# Patient Record
Sex: Male | Born: 1950 | Race: White | Hispanic: No | Marital: Married | Smoking: Former smoker
Health system: Southern US, Community
[De-identification: ages and names within clinical notes are randomized; demographics above are authoritative.]

## PROBLEM LIST (undated history)

## (undated) DIAGNOSIS — M1990 Unspecified osteoarthritis, unspecified site: Secondary | ICD-10-CM

## (undated) DIAGNOSIS — R7989 Other specified abnormal findings of blood chemistry: Secondary | ICD-10-CM

## (undated) DIAGNOSIS — M545 Low back pain, unspecified: Secondary | ICD-10-CM

## (undated) DIAGNOSIS — E119 Type 2 diabetes mellitus without complications: Secondary | ICD-10-CM

## (undated) DIAGNOSIS — R262 Difficulty in walking, not elsewhere classified: Secondary | ICD-10-CM

## (undated) DIAGNOSIS — G4730 Sleep apnea, unspecified: Secondary | ICD-10-CM

## (undated) DIAGNOSIS — L409 Psoriasis, unspecified: Secondary | ICD-10-CM

## (undated) DIAGNOSIS — L4050 Arthropathic psoriasis, unspecified: Secondary | ICD-10-CM

## (undated) DIAGNOSIS — E875 Hyperkalemia: Secondary | ICD-10-CM

## (undated) DIAGNOSIS — M961 Postlaminectomy syndrome, not elsewhere classified: Secondary | ICD-10-CM

## (undated) DIAGNOSIS — G629 Polyneuropathy, unspecified: Secondary | ICD-10-CM

## (undated) DIAGNOSIS — E7800 Pure hypercholesterolemia, unspecified: Secondary | ICD-10-CM

## (undated) DIAGNOSIS — I2510 Atherosclerotic heart disease of native coronary artery without angina pectoris: Secondary | ICD-10-CM

## (undated) DIAGNOSIS — K760 Fatty (change of) liver, not elsewhere classified: Secondary | ICD-10-CM

## (undated) DIAGNOSIS — M549 Dorsalgia, unspecified: Secondary | ICD-10-CM

## (undated) DIAGNOSIS — I499 Cardiac arrhythmia, unspecified: Secondary | ICD-10-CM

## (undated) HISTORY — PX: OTHER SURGICAL HISTORY: SHX169

## (undated) HISTORY — DX: Type 2 diabetes mellitus without complications: E11.9

## (undated) HISTORY — PX: ECHOCARDIOGRAM, TRANSTHORACIC: SHX3784

## (undated) HISTORY — DX: Dorsalgia, unspecified: M54.9

## (undated) HISTORY — DX: Pure hypercholesterolemia, unspecified: E78.00

## (undated) HISTORY — DX: Hyperkalemia: E87.5

## (undated) HISTORY — DX: Postlaminectomy syndrome, not elsewhere classified: M96.1

## (undated) HISTORY — DX: Atherosclerotic heart disease of native coronary artery without angina pectoris: I25.10

## (undated) HISTORY — DX: Sleep apnea, unspecified: G47.30

## (undated) HISTORY — DX: Low back pain, unspecified: M54.50

## (undated) HISTORY — PX: MICRODISCECTOMY LUMBAR: SUR864

---
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
## 1981-09-24 HISTORY — PX: KNEE ARTHROCENTESIS: SUR44

## 1991-09-25 HISTORY — PX: FRACTURE SURGERY: SHX138

## 2007-09-25 HISTORY — PX: CATARACT EXT.WITH IOL: SHX3376

## 2010-09-24 DIAGNOSIS — R911 Solitary pulmonary nodule: Secondary | ICD-10-CM

## 2010-09-24 HISTORY — DX: Solitary pulmonary nodule: R91.1

## 2011-10-26 HISTORY — PX: LUMBAR FUSION: SHX111

## 2012-07-23 ENCOUNTER — Encounter (INDEPENDENT_AMBULATORY_CARE_PROVIDER_SITE_OTHER): Payer: Self-pay | Admitting: Neurological Surgery

## 2012-07-23 ENCOUNTER — Ambulatory Visit (INDEPENDENT_AMBULATORY_CARE_PROVIDER_SITE_OTHER): Payer: BLUE CROSS/BLUE SHIELD | Admitting: Neurological Surgery

## 2012-07-23 VITALS — BP 130/78 | HR 68 | Wt 194.0 lb

## 2012-07-23 DIAGNOSIS — G8929 Other chronic pain: Secondary | ICD-10-CM

## 2012-07-23 DIAGNOSIS — M549 Dorsalgia, unspecified: Secondary | ICD-10-CM

## 2012-07-23 NOTE — Progress Notes (Addendum)
[facility]  New Patient Note    Referring MD:  [employer]  Primary Care MD: No primary provider on file. pt denies, since moving    Patient ID: Kent Jordan is a 61 y.o. male, DOB Mar 06, 1951.   MRN: [id number]    HPI     Chief Complaint   Patient presents with   . Advice Only     lumbar fusion     HPI  Kent Jordan is 61 y.o. male who presents for evaluation of his low back pain.  He has a history of 2 level diskectomy in 2012 which were not successful in alleviating his pain, by Dr. Wende Jordan.  He had L3-S1 Laminectomy and fusion for herniated disc in February 2013, by Dr. Wende Jordan.  His left leg symptoms resolved after surgery, however, he now has right leg pain.  He was happy with Dr. Piedad Jordan, however, the patient has moved out of of [facility] [place] and would like to pursue a neurosurgeon near his current home.  He has right buttocks pain, radiating to right groin and medial thigh which is a constant dull ache.  He has sharp stabbing pain to his right low back.  Sitting for a long time aggravates his symptoms.  Reclining and getting up from sitting alleviates his pain.  He has had physical therapy form March - August 2013, which was helpful.  He has had previous nerve root block injection to his right SI joint, which did not help.  He denies numbness or tingling.  He has occassional left knee weakness after walking a block.  He denies bowel or bladder problem.  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      Medical History     Past Medical History   Diagnosis Date   . Back pain    . Hypertensive disorder    . High cholesterol      Family History   Problem Relation Age of Onset   . Heart disease Mother    . Heart disease Father       Patient denies family history of disc or spinal surgery.    Surgical History     Past Surgical History   Procedure Date   . Lumbar fusion    . Microdiscectomy lumbar      patient had it done 10/12 11/12   . Knee arthrocentesis 1983       Social  History     History     Social History   . Marital Status: Significant Other     Spouse Name: N/A     Number of Children: N/A   . Years of Education: N/A     Occupational History   . Not on file.     Social History Main Topics   . Smoking status: Former Games developer   . Smokeless tobacco: Not on file   . Alcohol Use: Yes   . Drug Use:    . Sexually Active:      Other Topics Concern   . Not on file     Social History Narrative   . No narrative on file       Current Medications     Current Outpatient Prescriptions   Medication Sig Dispense Refill   . aspirin 81 MG chewable tablet Chew 81 mg by mouth daily.       . benazepril (LOTENSIN) 20 MG tablet        .  diazepam (VALIUM) 5 MG tablet        . folic acid (FOLVITE) 400 MCG tablet Take 400 mcg by mouth daily.       . methotrexate 2.5 MG tablet        . oxyCODONE-acetaminophen (PERCOCET) 5-325 MG per tablet        . predniSONE (DELTASONE) 5 MG tablet        . ZETIA 10 MG tablet            Allergies     Allergies   Allergen Reactions   . Ciprofloxacin Swelling       Review of Systems   Review of Systems  Constitutional: Positive for chills and activity change.   HENT: Positive for facial swelling (patient states this coming from his use of predsnone ).   Eyes: Positive for discharge.   Respiratory: Positive for apnea.   Cardiovascular: Negative.   Gastrointestinal: Negative.   Genitourinary: Negative.   Musculoskeletal: Positive for back pain (patient has had several back surgery) and joint swelling.   Skin: Negative.   Neurological: Positive for light-headedness (occasionally).   Hematological: Negative.   Psychiatric/Behavioral: Positive for sleep disturbance and decreased concentration.     Physical Examination   VITAL SIGNS:   weight is 87.998 kg (194 lb). His blood pressure is 130/78 and his pulse is 68.          Neurologic Exam  Mental Status   Oriented to person, place, and time.   Attention: normal. Concentration: normal.   Speech: speech is normal     Cranial Nerves       CN II   Visual fields full to confrontation.      CN III, IV, VI   EOMI  Pupils are equal, round, and reactive to light.     CN V   Facial sensation intact.      CN VII   Facial expression full, symmetric.     Motor Exam   Muscle bulk: normal     Strength   Strength 5/5 throughout except   Right ileopsoas, quad 4+/5    Sensory Exam   Light touch decrease plantar left great toe   Proprioception normal.     Gait, Coordination, and Reflexes      Gait  Gait: normal     Coordination   Romberg: negative  Tandem walking coordination: normal     Tremor   Resting tremor: absent  Intention tremor: absent  Action tremor: absent     Reflexes   Reflexes 2+ except as noted.   Palpated -  Left SI joint tenderness  Negative neck flexion / extension.     CVS exam: normal rate, regular rhythm, normal S1, S2, no murmurs, rubs, clicks or gallops.  Lungs: CTA, equal chest rise, no wheezes or rhonchi  Abdomen: Soft, nontender. Bowel sounds present x 4; no abdominal tenderness  Well developed, Well nourished, In no apparent distress  Well groomed.  No edema bilateral lower extremity.  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  Generalized protruding disc at L5-S1 with probably small fragment posterior to the ventral right L5 vertebral body with generalized protrusion here abutting the S1 roots bilaterally, but they are really not significantly displaced and no central stenosis.  Congenitally small spinal stenosis with  the general elements as detailed above.  04/28/12 CT scan  Findings: Surgical changes include transpedicle fusion hardware at L3 through S1, discectomy with intervetral spacers L4-5 and L5-S1, and left hemilaminectomy at L4-5.  There is no hardware fracture but there is evidence of fixation screw loosening at L3.  This is  identifiable on the previous plane films.  No sacral fracture.  SI joints shows mild osteoarthritis.  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  Impression: loosening of L3 transpedicle hardware.    Reviewed and Discussed with patient    Impression   I had an extensive discussion with Kent Jordan, regarding his condition. He presents after 3 previous back surgeries, including an L3-S1 dorsal fusion. His pain which resolved post-operatively, has shifted from the left to the right. He has no current imaging to allow for full surgical evaluation today, other than a CT scan that is limited in its ability to view the sagittal planes.     My recommendation to Kent Jordan is to continue his care with Dr.Dix, particularly if he is happy with his care. His knowledge of Kent Jordan circumstances, history, and anatomy. If he would like to transfer his care here, we would need to get an MRI scan to evaluate him anatomically. He will continue to see Dr.Dix, and will contact us in the future if needed.          Plan   No orders of the defined types were placed in this encounter.           Follow-up   No Follow-up on file.    Mahesh Ander Slade, MD    I have personally reviewed and confirmed the history and physical examination, and synthesized the medical decision making. This is a Level 4 [phone]) outpatient consultation of moderate complexity, that is medically necessary in the consideration of major neurological surgery.  The encounter is summarized below:     History Comprehensive     CC Required    X   HPI Extended 4-8 elements, or status of 3 chronic conditions    X   ROS 10 + elements    X   PFSH 3+ elements    X   Examination Comprehensive     Organ Systems 8+    X   Medical Decision Making High     Number of Dx/Mgmt Opts  Extensive (3) Sum points:  -Self-limited or minor (1);   -Established problem, stable or improving (1);  -Established problem, worsening (2)   -New problem, no w/u (3),   -New problem, with w/u (4)   X   Data  Review  Extensive (3) Sum points:  - Review or order clinical lab testsr (1);   - Review or order radiology test (1);    - Review or order medicine test (1)  - Discuss test with performing physician (1)  - Independent Review of imaging (2)   - Decision to obtain old records (1)   - Review and summation of old records (2)   X   Risk  Moderate Presence of one of the following:  -Two or more self-limited or minor problems  -One stable chronic illness  - Acute uncomplicated illness or injury  - Over the counter drugs  -Minor surgery  -Physical Therapy  -Occupational Therapy     X   Severity of Problem  Moderate to High    X     Attending Neurosurgeon: I have seen and examined the patient, reviewed the history and physical (  as outlined by  C.Zi Newbury, PA-C ), and reviewed the radiography. I agree with these elements. I synthesized and documented the assessment and plan , as above.

## 2012-07-23 NOTE — Progress Notes (Signed)
Review of Systems   Constitutional: Positive for chills and activity change.   HENT: Positive for facial swelling (patient states this coming from his use of predsnone ).    Eyes: Positive for discharge.   Respiratory: Positive for apnea.    Cardiovascular: Negative.    Gastrointestinal: Negative.    Genitourinary: Negative.    Musculoskeletal: Positive for back pain (patient has had several back surgery) and joint swelling.   Skin: Negative.    Neurological: Positive for light-headedness (occasionally).   Hematological: Negative.    Psychiatric/Behavioral: Positive for sleep disturbance and decreased concentration.

## 2012-09-05 ENCOUNTER — Other Ambulatory Visit: Payer: Self-pay

## 2012-09-05 ENCOUNTER — Ambulatory Visit: Payer: BLUE CROSS/BLUE SHIELD

## 2012-09-05 DIAGNOSIS — J40 Bronchitis, not specified as acute or chronic: Secondary | ICD-10-CM

## 2012-09-05 DIAGNOSIS — R9389 Abnormal findings on diagnostic imaging of other specified body structures: Secondary | ICD-10-CM

## 2012-09-05 DIAGNOSIS — R911 Solitary pulmonary nodule: Secondary | ICD-10-CM

## 2012-09-09 ENCOUNTER — Ambulatory Visit: Payer: BLUE CROSS/BLUE SHIELD

## 2012-09-09 DIAGNOSIS — R911 Solitary pulmonary nodule: Secondary | ICD-10-CM

## 2012-10-20 ENCOUNTER — Emergency Department: Payer: BLUE CROSS/BLUE SHIELD

## 2012-10-20 ENCOUNTER — Emergency Department
Admission: EM | Admit: 2012-10-20 | Discharge: 2012-10-20 | Disposition: A | Discharge status: Home / Self Care | Payer: BLUE CROSS/BLUE SHIELD | Attending: Emergency Medicine | Admitting: Emergency Medicine

## 2012-10-20 DIAGNOSIS — Z87891 Personal history of nicotine dependence: Secondary | ICD-10-CM | POA: Insufficient documentation

## 2012-10-20 DIAGNOSIS — W260XXA Contact with knife, initial encounter: Secondary | ICD-10-CM | POA: Insufficient documentation

## 2012-10-20 DIAGNOSIS — S61209A Unspecified open wound of unspecified finger without damage to nail, initial encounter: Secondary | ICD-10-CM | POA: Insufficient documentation

## 2012-10-20 DIAGNOSIS — I10 Essential (primary) hypertension: Secondary | ICD-10-CM | POA: Insufficient documentation

## 2012-10-20 DIAGNOSIS — Z23 Encounter for immunization: Secondary | ICD-10-CM | POA: Insufficient documentation

## 2012-10-20 DIAGNOSIS — L4050 Arthropathic psoriasis, unspecified: Secondary | ICD-10-CM | POA: Insufficient documentation

## 2012-10-20 DIAGNOSIS — IMO0001 Reserved for inherently not codable concepts without codable children: Secondary | ICD-10-CM

## 2012-10-20 DIAGNOSIS — W261XXA Contact with sword or dagger, initial encounter: Secondary | ICD-10-CM | POA: Insufficient documentation

## 2012-10-20 MED ORDER — BUPIVACAINE HCL (PF) 0.5 % IJ SOLN
4.00 mL | Freq: Once | INTRAMUSCULAR | Status: AC
Start: 2012-10-20 — End: 2012-10-20
  Administered 2012-10-20: 4 mL
  Filled 2012-10-20: qty 1

## 2012-10-20 MED ORDER — TETANUS-DIPHTH-ACELL PERTUSSIS 5-2.5-18.5 LF-MCG/0.5 IM SUSP
0.50 mL | Freq: Once | INTRAMUSCULAR | Status: AC
Start: 2012-10-20 — End: 2012-10-20
  Administered 2012-10-20: 0.5 mL via INTRAMUSCULAR
  Filled 2012-10-20: qty 0.5

## 2012-10-20 NOTE — ED Notes (Signed)
Pt presents with lac to left 3rd digit with a knife while slicing celery.small amt of bleeding.reports tingling in finger.

## 2012-10-20 NOTE — Discharge Instructions (Signed)
Return to the ER if worse.    Keep wound and dressing clean and dry for 2 days, if possible. If the bandage does get wet or dirty during the first two days, don't worry. Just change it and replace it with a new bandage.    In 2 days, begin washing the wound with soap and water 4 -6 times a day and cover the wound when at work. When at home, you may keep the wound uncovered.    Do not cover the wound with a bandaid 2 days before you are scheduled to have your stitches taken out.    If the wound looks wet or white around the edges on the day of suture removal, please wait 2 more days to have them removed. Don't cover the wound.    Do not soak the wound or immerse the wound in water or chemicals during healing.    Watch for signs of infection: increased pain, swelling, redness, pus, or red streaks. Return to the ED if signs of infection appear.    Return to the ED at 9 a.m. or see your doctor in 10 days for suture removal.         DIPHTERIA, TETANUS & PERTUSSIS Vaccine Information    An information sheet regarding the Diphtheria, Tetanus and Pertussis vaccine from the Centers for Disease Control is attached to these instructions.        TDap  Vaccine: What you need to know     Many Vaccine Information Statements are available in Spanish and other languages. See PromoAge.com.br  Hojas de Informacin Sobre Vacunas estn disponibles en Espaol y en muchos otros idiomas. Visite       1. Why get vaccinated?    Tetanus, diphtheria and pertussis can be very serious diseases, even for adolescents and adults. Tdap vaccine can protect [place] from these diseases.    TETANUS (Lockjaw) causes painful muscle tightening and stiffness, usually all over the body.  . It can lead to tightening of muscles in the head and neck so you can't open your mouth, swallow, or sometimes even breathe. Tetanus kills about 1 out of 5 people who are infected.      DIPHTHERIA can cause a thick coating to form in the back of the throat.  . It can  lead to breathing problems, paralysis, heart failure, and death.    PERTUSSIS (Whooping Cough) causes severe coughing spells, which can cause difficulty breathing, vomiting and disturbed sleep.  . It can also lead to weight loss, incontinence, and rib fractures.   Up to 2 in 100 adolescents and 5 in 100 adults with pertussis are hospitalized or have complications, which could include pneumonia or death.     These diseases are caused by bacteria. Diphtheria and pertussis are spread from person to person through coughing or sneezing. Tetanus enters the body through cuts, scratches, or wounds.    Before vaccines, the [place] saw as many as 200,000 cases a year of diphtheria and pertussis, and hundreds of cases of tetanus. Since vaccination began, tetanus and diphtheria have dropped by about 99% and pertussis by about 80%.    2. Tdap vaccine    Tdap vaccine can protect adolescents and adults from tetanus, diphtheria, and pertussis. One dose of Tdap is routinely given at age 92 or 61.  People who did not get Tdap at that age should get it as soon as possible.    Tdap is especially important for health care professionals and anyone  having close contact with a baby younger than 12 months.    Pregnant women should get a dose of Tdap during every pregnancy, to protect the newborn from   pertussis. Infants are most at risk for severe, life-threatening complications from pertussis.      A similar vaccine, called Td, protects from tetanus and diphtheria, but not pertussis. A Td booster should be given every 10 years. Tdap may be given as one of these boosters if you have not already gotten a dose.  Tdap may also be given after a severe cut or burn to prevent tetanus infection.    Your doctor can give you more information.    Tdap may safely be given at the same time as other vaccines.    3. Some people should not get this vaccine    . If you ever had a life-threatening allergic reaction after a dose of any tetanus,  diphtheria, or pertussis containing vaccine, OR if you have a severe allergy to any part of this vaccine, you should not get Tdap.  Tell your doctor if  you have any severe allergies.  . If you had a coma, or long or multiple seizures within 7 days after a childhood dose of DTP or DTaP, you should not get Tdap, unless a cause other than the vaccine was found.  You can still get Td.  . Talk to your doctor if you:  -  have epilepsy or another nervous system problem,  -  had severe pain or swelling after any vaccine containing diphtheria, tetanus or pertussis,   -  ever had Guillain-Barr Syndrome (GBS),  -  aren't feeling well on the day the shot is scheduled.    4. Risks of a vaccine reaction    With any medicine, including vaccines, there is a chance of side effects. These are usually mild and go away on their own, but serious reactions are also possible.    Brief fainting spells can follow a vaccination, leading to injuries from falling. Sitting or   lying down for about 15 minutes can help prevent these. Tell your doctor if you feel dizzy or light-headed, or have vision changes or ringing in the ears.    Mild Problems following Tdap  (Did not interfere with activities)  . Pain where the shot was given (about 3 in 4 adolescents or 2 in 3 adults)  . Redness or swelling where the shot was given (about 1 person in 5)  . Mild fever of at least 100.3F (up to about 1 in 25 adolescents or 1 in 100 adults)  . Headache (about 3 or 4 people in 10)  . Tiredness (about 1 person in 3 or 4)  . Nausea, vomiting, diarrhea, stomach ache (up to 1 in 4 adolescents or 1 in 10 adults)  . Chills, body aches, sore joints, rash, swollen glands (uncommon)    Moderate Problems following Tdap  (Interfered with activities, but did not require medical attention)  . Pain where the shot was given (about 1 in 5 adolescents or 1 in 100 adults)   . Redness or swelling where the shot was given (up to about 1 in 16 adolescents or 1 in 25  adults)  . Fever over 102F (about 1 in 100 adolescents or 1 in 250 adults)  . Headache (about 3 in 20 adolescents or 1 in 10 adults)  . Nausea, vomiting, diarrhea, stomach ache (up to 1 or 3 people in 100)  . Swelling of the  entire arm where the shot was given (up to about 3 in 100).     Severe Problems following Tdap  (Unable to perform usual activities; required medical attention)  . Swelling, severe pain, bleeding and redness in the arm where the shot was given (rare).    A severe allergic reaction could occur after any vaccine (estimated less than 1 in a million doses).     5. What if there is a serious reaction?    What should I look for?    . Look for anything that concerns you, such as signs of a severe allergic reaction, very high fever, or behavior changes.    Signs of a severe allergic reaction can include hives, swelling of the face and throat, difficulty breathing, a fast heartbeat, dizziness, and weakness. These would start a few minutes to a few hours after the vaccination.    What should I do?    . If you think it is a severe allergic reaction or other emergency that can't wait, call 9-1-1 or get the person to the nearest hospital. Otherwise, call your doctor.  . Afterward, the reaction should be reported to the Vaccine Adverse Event Reporting System (VAERS). Your doctor might file this report, or you can do it yourself through the VAERS web site at www.vaers.LAgents.no, or by calling 1-484-154-1665.    VAERS is only for reporting reactions. They do not give medical advice.      6. The National Vaccine Injury Compensation Program    The [employer] Vaccine Injury Compensation Program (VICP) is a federal program that was created to compensate people who may have been injured by certain vaccines.    Persons who believe they may have been injured by a vaccine can learn about the program and about filing a claim by calling 1-754-599-5174 or visiting the VICP website at SpiritualWord.at.      7.  How can I learn more?  . Ask your doctor.  . Call your local or state health department.  Benay Pillow the Centers for Disease Control and   Prevention (CDC):  - Call 249-598-4027 (1-800-CDC-INFO) or  - Visit CDC's website at PicCapture.uy      Vaccine Information Statement (Interim)  Tdap Vaccine  (01/31/12)   [place]  [phone]    Department of Health and Insurance risk surveyor for Disease Control and Prevention      Office Use Only

## 2012-10-20 NOTE — ED Provider Notes (Signed)
EMERGENCY DEPARTMENT HISTORY AND PHYSICAL EXAM     Physician/Midlevel provider first contact with patient: 10/20/12 1850         Date: 10/20/2012  Patient Name: Kent Jordan    History of Presenting Illness     Chief Complaint   Patient presents with   . finger laceration        History Provided By: patient    Chief Complaint: finger laceration  Onset: just pta  Timing: constant  Location: left 3rd digit  Quality: straight  Severity: mild   Modifying Factors: none   Associated Symptoms: none    Additional History: Kent Jordan is a 62 y.o. male arrived to the ED due to L middle finger laceration that occurred just pta. The pt sustained laceration while chopping celery for dinner tonight. The pt reports he is on methotrexate for psoriatic arthritis and as such tends to bleed a little more than usual. The pt has no other complaints currently. Patient is right handed. Last Td: unknown.    PCP: Pcp, Noneorunknown, MD      Current Facility-Administered Medications   Medication Dose Route Frequency Provider Last Rate Last Dose   . [COMPLETED] bupivacaine (PF) (MARCAINE) 0.5 % injection 4 mL  4 mL Other Once in ED Fransisca Connors, PA   4 mL at 10/20/12 1916   . [COMPLETED] TdaP Booster (BOOSTRIX) injection 0.5 mL  0.5 mL Intramuscular Once Fransisca Connors, PA   0.5 mL at 10/20/12 1912     Current Outpatient Prescriptions   Medication Sig Dispense Refill   . aspirin 81 MG chewable tablet Chew 81 mg by mouth daily.       . benazepril (LOTENSIN) 20 MG tablet        . diazepam (VALIUM) 5 MG tablet        . folic acid (FOLVITE) 400 MCG tablet Take 400 mcg by mouth daily.       . methotrexate 2.5 MG tablet        . oxyCODONE-acetaminophen (PERCOCET) 5-325 MG per tablet        . predniSONE (DELTASONE) 5 MG tablet        . ZETIA 10 MG tablet            Past History     Past Medical History:  Past Medical History   Diagnosis Date   . Back pain    . Hypertensive disorder    . High cholesterol        Past Surgical  History:  Past Surgical History   Procedure Date   . Lumbar fusion    . Microdiscectomy lumbar      patient had it done 10/12 11/12   . Knee arthrocentesis 1983       Family History:  Family History   Problem Relation Age of Onset   . Heart disease Mother    . Heart disease Father        Social History:  History   Substance Use Topics   . Smoking status: Former Games developer   . Smokeless tobacco: Not on file   . Alcohol Use: Yes       Allergies:  Allergies   Allergen Reactions   . Ciprofloxacin Swelling       Review of Systems     Review of Systems   Constitutional: Negative for fever, chills and diaphoresis.   HENT: Positive for neck pain.    Gastrointestinal: Negative for nausea, vomiting and diarrhea.  Musculoskeletal: Negative for myalgias and falls.   Skin: Negative for itching and rash.        +left middle finger laceration   Neurological: Negative for dizziness.   Endo/Heme/Allergies: Negative for environmental allergies.         Physical Exam   BP 160/77  Pulse 59  Temp 98.2 F (36.8 C)  Resp 16  Ht 1.803 m  Wt 90.719 kg  BMI 27.91 kg/m2  SpO2 97%    Physical Exam   Nursing note and vitals reviewed.  Constitutional: He is oriented to person, place, and time. He appears well-developed and well-nourished.   HENT:   Head: Normocephalic and atraumatic.   Eyes: Pupils are equal, round, and reactive to light.   Musculoskeletal:        Left hand: He exhibits tenderness and laceration. He exhibits normal range of motion, no bony tenderness, normal two-point discrimination, normal capillary refill, no deformity and no swelling. normal sensation noted. Normal strength noted.        Hands:       2.7cm tidy, full-thickness laceration through the radial tip of left middle finger and extending through the nailplate and into the nailbed. Bleeding is controlled. No fb or fx noted. Flexion and extension is intact. Brisk capillary refill is intact.    Neurological: He is alert and oriented to person, place, and time.    Skin: Skin is warm and dry.   Psychiatric: He has a normal mood and affect. His behavior is normal. Judgment and thought content normal.         Diagnostic Study Results     Labs -     Results     ** No Results found for the last 24 hours. **          Radiologic Studies -   Radiology Results (24 Hour)     ** No Results found for the last 24 hours. **      .      Medical Decision Making   I am the first provider for this patient.    I reviewed the vital signs, available nursing notes, past medical history, past surgical history, family history and social history.    Vital Signs-Reviewed the patient's vital signs.     No data found.      Pulse Oximetry Analysis - Normal 97% on RA    ED Course:    Provider Notes:   7:53 PM - Pt counseled on diagnosis, f/u plans, and signs and symptoms when to return to ED.  Pt is stable and ready for discharge.        Procedures:  Lac Repair  Date/Time: 10/20/2012 7:10 PM  Performed by: Dario Ave D  Authorized by: Dario Ave D  Consent: Verbal consent obtained.  Risks and benefits: risks, benefits and alternatives were discussed  Consent given by: patient  Patient understanding: patient states understanding of the procedure being performed  Patient identity confirmed: verbally with patient and arm band  Time out: Immediately prior to procedure a "time out" was called to verify the correct patient, procedure, equipment, support staff and site/side marked as required.  Body area: upper extremity  Location details: left long finger  Laceration length: 2.7 cm  Foreign bodies: no foreign bodies  Tendon involvement: none  Nerve involvement: none  Vascular damage: no  Anesthesia: digital block  Local anesthetic: bupivacaine 0.5% without epinephrine  Anesthetic total: 5 ml  Patient sedated: no  Preparation: Patient was prepped and  draped in the usual sterile fashion.  Irrigation solution: saline  Irrigation method: syringe  Amount of cleaning: extensive  Debridement: moderate  (distal portion of nailplate was bluntly dissected and removed to facilitate repair of the nailbed.)  Degree of undermining: none  Skin closure: 4-0 Prolene  Wound subcutaneous closure material used: nailbed closed with 6-0 vicryl x 3.  Number of sutures: 6  Technique: simple  Approximation: close  Approximation difficulty: simple  Dressing: 4x4 sterile gauze, antibiotic ointment, gauze roll and splint  Patient tolerance: Patient tolerated the procedure well with no immediate complications.        Diagnosis     Clinical Impression:   1. Laceration of third finger of left hand    2. Need for Tdap vaccination        _______________________________    Attestations:  This note is prepared by Adelina Mings, acting as Scribe for [employer], PA-C.    Jamelle Rushing, PA-C: The scribe's documentation has been prepared under my direction and personally reviewed by me in its entirety.  I confirm that the note above accurately reflects all work, treatment, procedures, and medical decision making performed by me.    _______________________________          Fransisca Connors, [place]  10/21/12 [phone]

## 2012-10-21 NOTE — ED Provider Notes (Signed)
I have reviewed the notes, assessments, and/or procedures performed by PA Mortiere, I concur with his documentation of Kent Jordan.      Westley Foots, MD  10/21/12 1028

## 2012-11-27 ENCOUNTER — Other Ambulatory Visit: Payer: Self-pay | Admitting: Pulmonary Disease

## 2012-11-27 DIAGNOSIS — R911 Solitary pulmonary nodule: Secondary | ICD-10-CM

## 2012-12-01 ENCOUNTER — Ambulatory Visit: Payer: BLUE CROSS/BLUE SHIELD | Attending: Pulmonary Disease

## 2012-12-01 DIAGNOSIS — R911 Solitary pulmonary nodule: Secondary | ICD-10-CM | POA: Insufficient documentation

## 2012-12-18 ENCOUNTER — Other Ambulatory Visit: Payer: Self-pay | Admitting: Physical Medicine & Rehabilitation

## 2012-12-18 DIAGNOSIS — M961 Postlaminectomy syndrome, not elsewhere classified: Secondary | ICD-10-CM

## 2012-12-18 DIAGNOSIS — M461 Sacroiliitis, not elsewhere classified: Secondary | ICD-10-CM

## 2012-12-18 DIAGNOSIS — M545 Low back pain, unspecified: Secondary | ICD-10-CM

## 2012-12-22 ENCOUNTER — Ambulatory Visit: Payer: BLUE CROSS/BLUE SHIELD

## 2012-12-22 ENCOUNTER — Ambulatory Visit: Payer: BLUE CROSS/BLUE SHIELD | Attending: Physical Medicine & Rehabilitation

## 2012-12-22 ENCOUNTER — Other Ambulatory Visit: Payer: Self-pay | Admitting: Physical Medicine & Rehabilitation

## 2012-12-22 DIAGNOSIS — M461 Sacroiliitis, not elsewhere classified: Secondary | ICD-10-CM

## 2012-12-22 DIAGNOSIS — M545 Low back pain, unspecified: Secondary | ICD-10-CM

## 2012-12-22 DIAGNOSIS — Z981 Arthrodesis status: Secondary | ICD-10-CM | POA: Insufficient documentation

## 2012-12-22 DIAGNOSIS — M5380 Other specified dorsopathies, site unspecified: Secondary | ICD-10-CM | POA: Insufficient documentation

## 2012-12-22 DIAGNOSIS — M961 Postlaminectomy syndrome, not elsewhere classified: Secondary | ICD-10-CM

## 2012-12-22 DIAGNOSIS — M48061 Spinal stenosis, lumbar region without neurogenic claudication: Secondary | ICD-10-CM | POA: Insufficient documentation

## 2012-12-22 DIAGNOSIS — M5126 Other intervertebral disc displacement, lumbar region: Secondary | ICD-10-CM | POA: Insufficient documentation

## 2012-12-22 LAB — POCT CREATININE STAT SENSOR (AH)
Creatinine POCT: 1.2 mg/dL (ref ?–1.2)
GFR POCT: >60 mL/min/{1.73_m2} (ref 60–?)
Reference Range: NORMAL
Reference Range: NORMAL

## 2012-12-22 MED ORDER — GADOBUTROL 1 MMOL/ML IV SOLN
9.0000 mL | Freq: Once | INTRAVENOUS | Status: AC | PRN
Start: 2012-12-22 — End: 2012-12-22
  Administered 2012-12-22: 9 mmol via INTRAVENOUS

## 2013-03-20 ENCOUNTER — Other Ambulatory Visit: Payer: Self-pay | Admitting: Physical Medicine & Rehabilitation

## 2013-03-20 DIAGNOSIS — IMO0002 Reserved for concepts with insufficient information to code with codable children: Secondary | ICD-10-CM

## 2013-03-20 DIAGNOSIS — M5124 Other intervertebral disc displacement, thoracic region: Secondary | ICD-10-CM

## 2013-03-20 DIAGNOSIS — M47817 Spondylosis without myelopathy or radiculopathy, lumbosacral region: Secondary | ICD-10-CM

## 2013-03-25 ENCOUNTER — Ambulatory Visit: Payer: BLUE CROSS/BLUE SHIELD

## 2013-08-24 ENCOUNTER — Other Ambulatory Visit: Payer: Self-pay | Admitting: Internal Medicine

## 2013-08-24 DIAGNOSIS — R945 Abnormal results of liver function studies: Secondary | ICD-10-CM

## 2013-08-27 ENCOUNTER — Ambulatory Visit
Admission: RE | Admit: 2013-08-27 | Discharge: 2013-08-27 | Disposition: A | Discharge status: Home / Self Care | Payer: BLUE CROSS/BLUE SHIELD | Source: Ambulatory Visit | Attending: Internal Medicine | Admitting: Internal Medicine

## 2013-08-27 DIAGNOSIS — R945 Abnormal results of liver function studies: Secondary | ICD-10-CM | POA: Insufficient documentation

## 2013-08-27 DIAGNOSIS — K7689 Other specified diseases of liver: Secondary | ICD-10-CM | POA: Insufficient documentation

## 2013-08-27 DIAGNOSIS — K869 Disease of pancreas, unspecified: Secondary | ICD-10-CM | POA: Insufficient documentation

## 2013-09-09 ENCOUNTER — Other Ambulatory Visit: Payer: Self-pay | Admitting: Pulmonary Disease

## 2013-09-09 DIAGNOSIS — R918 Other nonspecific abnormal finding of lung field: Secondary | ICD-10-CM

## 2013-09-11 ENCOUNTER — Ambulatory Visit
Admission: RE | Admit: 2013-09-11 | Discharge: 2013-09-11 | Disposition: A | Discharge status: Home / Self Care | Payer: BLUE CROSS/BLUE SHIELD | Source: Ambulatory Visit | Attending: Pulmonary Disease | Admitting: Pulmonary Disease

## 2013-09-11 DIAGNOSIS — R911 Solitary pulmonary nodule: Secondary | ICD-10-CM | POA: Insufficient documentation

## 2013-09-11 DIAGNOSIS — R918 Other nonspecific abnormal finding of lung field: Secondary | ICD-10-CM

## 2013-09-22 ENCOUNTER — Emergency Department
Admission: EM | Admit: 2013-09-22 | Discharge: 2013-09-22 | Disposition: A | Discharge status: Home / Self Care | Payer: BLUE CROSS/BLUE SHIELD | Attending: Emergency Medicine | Admitting: Emergency Medicine

## 2013-09-22 ENCOUNTER — Emergency Department: Payer: BLUE CROSS/BLUE SHIELD

## 2013-09-22 DIAGNOSIS — K5289 Other specified noninfective gastroenteritis and colitis: Secondary | ICD-10-CM | POA: Insufficient documentation

## 2013-09-22 DIAGNOSIS — R519 Headache, unspecified: Secondary | ICD-10-CM

## 2013-09-22 DIAGNOSIS — K529 Noninfective gastroenteritis and colitis, unspecified: Secondary | ICD-10-CM

## 2013-09-22 DIAGNOSIS — R51 Headache: Secondary | ICD-10-CM | POA: Insufficient documentation

## 2013-09-22 DIAGNOSIS — Z87891 Personal history of nicotine dependence: Secondary | ICD-10-CM | POA: Insufficient documentation

## 2013-09-22 DIAGNOSIS — Z883 Allergy status to other anti-infective agents status: Secondary | ICD-10-CM | POA: Insufficient documentation

## 2013-09-22 DIAGNOSIS — Z888 Allergy status to other drugs, medicaments and biological substances status: Secondary | ICD-10-CM | POA: Insufficient documentation

## 2013-09-22 DIAGNOSIS — I10 Essential (primary) hypertension: Secondary | ICD-10-CM | POA: Insufficient documentation

## 2013-09-22 HISTORY — DX: Unspecified osteoarthritis, unspecified site: M19.90

## 2013-09-22 LAB — CBC AND DIFFERENTIAL
Hematocrit: 42.9 % (ref 42.0–52.0)
Hgb: 15.3 g/dL (ref 13.0–17.0)
MCH: 32.6 pg — ABNORMAL HIGH (ref 28.0–32.0)
MCHC: 35.7 g/dL (ref 32.0–36.0)
MCV: 91.3 fL (ref 80.0–100.0)
MPV: 8.9 fL — ABNORMAL LOW (ref 9.4–12.3)
Platelets: 223 10*3/uL (ref 140–400)
RBC: 4.7 10*6/uL (ref 4.70–6.00)
RDW: 13 % (ref 12–15)
WBC: 16.33 10*3/uL — ABNORMAL HIGH (ref 3.50–10.80)

## 2013-09-22 LAB — BASIC METABOLIC PANEL
Anion Gap: 13 (ref 5.0–15.0)
BUN: 15 mg/dL (ref 9.0–21.0)
CO2: 20 mEq/L — ABNORMAL LOW (ref 22–29)
Calcium: 9.1 mg/dL (ref 8.5–10.5)
Chloride: 102 mEq/L (ref 98–107)
Creatinine: 0.9 mg/dL (ref 0.7–1.3)
Glucose: 129 mg/dL — ABNORMAL HIGH (ref 70–100)
Potassium: 4 mEq/L (ref 3.5–5.1)
Sodium: 135 mEq/L — ABNORMAL LOW (ref 136–145)

## 2013-09-22 LAB — HEPATIC FUNCTION PANEL
ALT: 81 U/L — ABNORMAL HIGH (ref 0–55)
AST (SGOT): 30 U/L (ref 5–34)
Albumin/Globulin Ratio: 1.4 (ref 0.9–2.2)
Albumin: 3.9 g/dL (ref 3.5–5.0)
Alkaline Phosphatase: 79 U/L (ref 40–150)
Bilirubin Direct: 0.3 mg/dL (ref 0.0–0.5)
Bilirubin Indirect: 0.5 mg/dL (ref 0.0–1.1)
Bilirubin, Total: 0.8 mg/dL (ref 0.2–1.2)
Globulin: 2.7 g/dL (ref 2.0–3.6)
Protein, Total: 6.6 g/dL (ref 6.0–8.3)

## 2013-09-22 LAB — MAN DIFF ONLY
Band Neutrophils Absolute: 0.49 10*3/uL (ref 0.00–1.00)
Band Neutrophils: 3 %
Basophils Absolute Manual: 0 10*3/uL (ref 0.00–0.20)
Basophils Manual: 0 %
Eosinophils Absolute Manual: 0 10*3/uL (ref 0.00–0.70)
Eosinophils Manual: 0 %
Lymphocytes Absolute Manual: 1.96 10*3/uL (ref 0.50–4.40)
Lymphocytes Manual: 12 %
Monocytes Absolute: 2.29 10*3/uL — ABNORMAL HIGH (ref 0.00–1.20)
Monocytes Manual: 14 %
Neutrophils Absolute Manual: 11.59 10*3/uL — ABNORMAL HIGH (ref 1.80–8.10)
Nucleated RBC: 0 (ref 0–1)
Segmented Neutrophils: 71 %

## 2013-09-22 LAB — GFR: EGFR: >60

## 2013-09-22 LAB — CELL MORPHOLOGY: Cell Morphology: NORMAL

## 2013-09-22 LAB — LIPASE: Lipase: 11 U/L (ref 8–78)

## 2013-09-22 MED ORDER — KETOROLAC TROMETHAMINE 30 MG/ML IJ SOLN
30.00 mg | Freq: Once | INTRAMUSCULAR | Status: AC
Start: 2013-09-22 — End: 2013-09-22
  Administered 2013-09-22: 30 mg via INTRAVENOUS
  Filled 2013-09-22: qty 1

## 2013-09-22 MED ORDER — METOCLOPRAMIDE HCL 5 MG/ML IJ SOLN
10.0000 mg | Freq: Once | INTRAMUSCULAR | Status: AC
Start: 2013-09-22 — End: 2013-09-22
  Administered 2013-09-22: 10 mg via INTRAVENOUS
  Filled 2013-09-22: qty 2

## 2013-09-22 MED ORDER — ONDANSETRON 4 MG PO TBDP
4.0000 mg | ORAL_TABLET | Freq: Four times a day (QID) | ORAL | Status: AC | PRN
Start: 2013-09-22 — End: 2013-09-27

## 2013-09-22 MED ORDER — SODIUM CHLORIDE 0.9 % IV BOLUS
1000.0000 mL | Freq: Once | INTRAVENOUS | Status: AC
Start: 2013-09-22 — End: 2013-09-22
  Administered 2013-09-22: 1000 mL via INTRAVENOUS

## 2013-09-22 NOTE — ED Notes (Addendum)
Pt prescription for Zofran ODT called to [facility] at [facility] with [facility] Pharmacist.pt informed.

## 2013-09-22 NOTE — Discharge Instructions (Signed)
Dear Kent Jordan:     Thank you for choosing the [facility] for your healthcare needs, it was a privilege caring for you. We are genuinely concerned about your health and comfort and I hope your visit today was excellent.     Below is some information that our patients often find helpful.    Instructions:   Please follow-up with your primary care doctor.    Please start a liquid diet for your symptoms and then switch to bland diet as needed.     We recommend taking Zofran ODT as needed for your nausea.      We wish you good health and please do not hesitate to contact me if I can ever be of any assistance.     Sincerely,   [name], MD  [facility]  [phone]    Vomiting / Diarrhea [name]), Viral    You have been diagnosed with vomiting and diarrhea, also called gastroenteritis or "stomach flu." In your case, the cause is thought to be a virus.    Although the term "stomach flu" is often used, gastroenteritis IS NOT THE FLU. The real flu is a serious respiratory (lung) infection caused by a virus called influenza.    Gastroenteritis almost always causes diarrhea and sometimes, bloody diarrhea. It might also cause abdominal cramping, vomiting, or fever (temperature higher than 100.28F / 38C). If you vomit, but do not have diarrhea, you may not have gastroenteritis. The usual treatment is medication for nausea and diarrhea. It might be helpful to avoid eating for the next day. Drink clear liquids, including water, broth, or diluted (watered down) juice. Avoid large heavy meals that make you sick to your stomach. Add more foods as you feel better. Symptoms usually improve over a few days but can last up to a week.    It is important to get enough fluids when you have a stomach virus.    Follow up with your primary doctor if you do not improve or if you feel worse.    YOU SHOULD SEEK MEDICAL ATTENTION IMMEDIATELY, EITHER HERE OR AT THE NEAREST EMERGENCY  DEPARTMENT, IF ANY OF THE FOLLOWING OCCURS:   You do not urinate at least once every 8 hours.   Your vomiting becomes more frequent or you cannot keep fluids down.   Your vomit is bloody or dark green or your stool is bloody.   You do not improve over 2 to 3 days.   You develop any new symptoms or concerns.    Headache    You have been treated for a headache.    Headaches are very common. Most of the time they are benign (not harmful). Some headaches can be very serious. Your headache appears to be benign. The doctor feels it is safe for you to go home.    If you continue to have headaches, or if this headache does not resolve over the next few days, you should be evaluated by your regular doctor or a neurologist. Keep a "headache diary." This may help your doctor learn the cause of your headaches.    Take your headache medication as directed. This is especially important if your doctor has placed you on a daily medication to prevent headaches.    YOU SHOULD SEEK MEDICAL ATTENTION IMMEDIATELY, EITHER HERE OR AT THE NEAREST EMERGENCY DEPARTMENT, IF ANY OF THE FOLLOWING OCCURS:   Your headache gets worse.   You have a severe headache that occurs  suddenly.   Your head pain is different from your normal headache.   You have a fever, especially with a stiff neck.   You feel numbness, tingling, or weakness in your arms or legs.   You pass out.   You have problems with your vision.   You vomit and have trouble taking medication or keeping it down.

## 2013-09-22 NOTE — ED Provider Notes (Signed)
EMERGENCY DEPARTMENT HISTORY AND PHYSICAL EXAM     Physician/Midlevel provider first contact with patient: 09/22/13 2440         Date: 09/22/2013  Patient Name: Kent Jordan  Attending Physician: [name] MD  Diagnosis and Treatment Plan       Clinical Impression:   1. Gastroenteritis    2. Headache        Treatment Plan:   ED Disposition     Discharge Kent Jordan discharge to home/self care.    Condition at disposition: Stable            History of Presenting Illness     Chief Complaint   Patient presents with   . Emesis   . Diarrhea   . Headache       History Provided By: Patient   Chief Complaint: vomiting/ diarrhea   Onset: x 2 AM  Timing: intermittent   Severity: moderate   Modifying Factors:none   Associated sxs: + headache and body aches     Additional History: Kent Jordan is a 62 y.o. male presents with intermittent episodes of vomiting/diarrhea associated with body aches and headache x 2 AM. Pt had a hamburger and a couple of beers for lunch yesterday. He reports 4 episodes of vomiting since onset. He denies any blood in stool or vomit, cough, rhinorrhea, sore throat or any other upper respiratory symptoms.    PCP: Pcp, Noneorunknown, MD      Current facility-administered medications:[COMPLETED] ketorolac (TORADOL) injection 30 mg, 30 mg, Intravenous, Once, Destinee Taber, Raynelle Chary, MD, 30 mg at 09/22/13 0934;  [COMPLETED] metoclopramide (REGLAN) injection 10 mg, 10 mg, Intravenous, Once, Lorie Cleckley, Raynelle Chary, MD, 10 mg at 09/22/13 0934;  [COMPLETED] sodium chloride 0.9 % bolus 1,000 mL, 1,000 mL, Intravenous, Once, [name], MD, 1,000 mL at 09/22/13 0936  Current outpatient prescriptions:adalimumab (HUMIRA) 40 MG/0.8ML injection, Inject 40 mg into the skin once., Disp: , Rfl: ;  rosuvastatin (CRESTOR) 5 MG tablet, Take 5 mg by mouth daily., Disp: , Rfl: ;  Tapentadol HCl (NUCYNTA ER) 200 MG Tablet SR 12 hr, Take by mouth., Disp: , Rfl: ;  aspirin 81 MG chewable tablet, Chew 81 mg by mouth daily., Disp: , Rfl:  ;  benazepril (LOTENSIN) 20 MG tablet, , Disp: , Rfl:   diazepam (VALIUM) 5 MG tablet, , Disp: , Rfl: ;  folic acid (FOLVITE) 400 MCG tablet, Take 400 mcg by mouth daily., Disp: , Rfl: ;  methotrexate 2.5 MG tablet, , Disp: , Rfl: ;  ondansetron (ZOFRAN ODT) 4 MG disintegrating tablet, Take 1 tablet (4 mg total) by mouth every 6 (six) hours as needed for Nausea., Disp: 20 tablet, Rfl: 0;  oxyCODONE-acetaminophen (PERCOCET) 5-325 MG per tablet, , Disp: , Rfl:   predniSONE (DELTASONE) 5 MG tablet, , Disp: , Rfl: ;  ZETIA 10 MG tablet, , Disp: , Rfl:     Past Medical History     Past Medical History   Diagnosis Date   . Back pain    . Hypertensive disorder    . High cholesterol    . Arthritis      Past Surgical History   Procedure Date   . Lumbar fusion    . Microdiscectomy lumbar      patient had it done 10/12 11/12   . Knee arthrocentesis 1983       Family History     Family History   Problem Relation Age of Onset   . Heart disease Mother    .  Heart disease Father        Social History     History     Social History   . Marital Status: Single     Spouse Name: N/A     Number of Children: N/A   . Years of Education: N/A     Social History Main Topics   . Smoking status: Former Games developer   . Smokeless tobacco: Not on file   . Alcohol Use: 1.2 oz/week     2 Cans of beer per week   . Drug Use:    . Sexually Active:      Other Topics Concern   . Not on file     Social History Narrative   . No narrative on file        Allergies     Allergies   Allergen Reactions   . Ciprofloxacin Swelling   . Gabapentin        Review of Systems     Review of Systems:  General: negative for chills, fever or malaise  Psychological: negative for depression or suicidal ideation  ENT: negative for epistaxis, nasal congestion or sore throat  Respiratory: no cough, shortness of breath, or wheezing  Cardiovascular: no chest pain or dyspnea on exertion  Gastrointestinal: +abdominal pain, change in bowel habits, no black or bloody  stools  Genito-Urinary: no dysuria, trouble voiding, or hematuria  Musculoskeletal: negative for - joint pain or swelling in leg - bilateral  Neurological: no TIA or stroke symptoms, +HA  Dermatological: negative for pruritus and rash      Physical Exam     BP 109/74  Pulse 98  Temp 98.2 F (36.8 C)  Resp 16  Ht 1.803 m  Wt 90.719 kg  BMI 27.91 kg/m2  SpO2 97%    Constitutional: Vital signs reviewed. General malaise  Neuro: alert and oriented x 3, CN II - XII intact, upper and lower extremities 5/5 strength intact, sensation intact, normal gait  Head: Normocephalic, atraumatic. No external trauma noted.  Eyes: Conjunctiva and sclera are normal. Extraocular movements intact, pupils equal, round, reactive  Ear, Nose, Throat:  Normal external examination of the nose and ears. Oropharynx clear, no tonsillar exudates. Uvula midline.  Neck: Normal range of motion. Trachea midline. No midline cervical spine tenderness. No cervical lymphadenopathy  Respiratory/Chest: Clear to auscultation. No respiratory distress. No wheezing, rhonchi or rales.  Cardiovascular: tachycardic rate and regular rhythm. No murmurs.  Abdomen: Soft. Non-tender to palpation. No masses. No guarding or rebound.  Back: No midline tenderness, no CVA tenderness.   Extremities: Upper and lower extremities with no edema, no cyanosis. Normal range of motion.  Skin: Warm and dry. No rash.    Diagnostic Study Results     Labs -     Results     Procedure Component Value Units Date/Time    Basic Metabolic Panel [[id number]]  (Abnormal) Collected:09/22/13 0927    Specimen Information:Blood Updated:09/22/13 1010     Glucose 129 (H) mg/dL      BUN 03.4 mg/dL      Creatinine 0.9 mg/dL      CALCIUM 9.1 mg/dL      Sodium 742 (L) mEq/L      Potassium 4.0 mEq/L      Chloride 102 mEq/L      CO2 20 (L) mEq/L      Anion Gap 13.0     Hepatic Function Panel (LFTs) [[id number]]  (Abnormal) Collected:09/22/13 0927    Specimen Information:Blood Updated:09/22/13 1010  Bilirubin, Total 0.8 mg/dL      Bilirubin, Direct 0.3 mg/dL      Bilirubin, Indirect 0.5 mg/dL      AST (SGOT) 30 U/L      ALT 81 (H) U/L      Alkaline Phosphatase 79 U/L      Protein, Total 6.6 g/dL      Albumin 3.9 g/dL      Globulin 2.7 g/dL      Albumin/Globulin Ratio 1.4     Lipase [[id number]] Collected:09/22/13 0927    Specimen Information:Blood Updated:09/22/13 1010     Lipase 11 U/L     GFR [[id number]] Collected:09/22/13 0927     EGFR >60.0 Updated:09/22/13 1010    Cell MorpHology [[id number]] Collected:09/22/13 0927     Cell Morphology: Normal Updated:09/22/13 1005    CBC and differential [[id number]]  (Abnormal) Collected:09/22/13 0927    Specimen Information:Blood / Blood Updated:09/22/13 1005     WBC 16.33 (H) x10 3/uL      RBC 4.70 x10 6/uL      Hgb 15.3 g/dL      Hematocrit 62.9 %      MCV 91.3 fL      MCH 32.6 (H) pg      MCHC 35.7 g/dL      RDW 13 %      Platelets 223 x10 3/uL      MPV 8.9 (L) fL     Manual Differential [[id number]]  (Abnormal) Collected:09/22/13 0927     Segmented Neutrophils 71 % Updated:09/22/13 1005     Band Neutrophils 3 %      Lymphocytes Manual 12 %      Monocytes Manual 14 %      Eosinophils Manual 0 %      Basophils Manual 0 %      Nucleated RBC 0      Abs Seg Manual 11.59 (H) x10 3/uL      Bands Absolute 0.49 x10 3/uL      Absolute Lymph Manual 1.96 x10 3/uL      Monocytes Absolute 2.29 (H) x10 3/uL      Absolute Eos Manual 0.00 x10 3/uL      Absolute Baso Manual 0.00 x10 3/uL           Radiologic Studies -   Radiology Results (24 Hour)     ** No Results found for the last 24 hours. **      .    Medical Decision Making   I am the first provider for this patient.    I reviewed the vital signs, available nursing notes, past medical history, past surgical history, family history and social history.    Vital Signs-Reviewed the patient's vital signs.     Patient Vitals for the past 12 hrs:   BP Temp Pulse Resp   09/22/13 1044 109/74 mmHg - 98  16    09/22/13 0912 164/80 mmHg 98.2  F (36.8 C) 107  18          ED Course:     10:03 AM  physician re-evaluated pt at bedside and is feeling better.     10:43 AM  physician re-evaluated pt at bedside and tolerated PO.     10:44 AM  Pt is feeling better and would like to go home.  Discussed test results with pt and counseled on diagnosis, f/u plans, and signs and symptoms when to return to ED.  Pt is stable and ready for discharge.  Provider Notes: likely viral gastroenteritis from hamburger and beer yesterday  Non specific leukocytosis  Feels better with IV toradol, fluids, reglan  Will prescribe zofran ODT and recommend liquid diet, advance as tolerated      Doctor's Notes     Throughout the stay in the Emergency Department, questions and concerns surrounding pain control, care plans, diagnostic studies, effects of medications administered or prescribed, and future prognostic dilemmas were assessed and addressed.    ROS addendum: The patient and/or family was asked if they had any other complaints or concerns that we could address today and nothing of significance was noted.     _______________________________  Attestations:    This note is prepared by Alda Ponder, acting as Scribe for [name], MD.     [name], MD: The scribe's documentation has been prepared under my direction and personally reviewed by me in its entirety.  I confirm that the note above accurately reflects all work, treatment, procedures, and medical decision making performed by me.        [name], MD  09/22/13 [phone]

## 2013-09-24 DIAGNOSIS — Z9689 Presence of other specified functional implants: Secondary | ICD-10-CM

## 2013-09-24 HISTORY — DX: Presence of other specified functional implants: Z96.89

## 2014-03-10 ENCOUNTER — Ambulatory Visit (INDEPENDENT_AMBULATORY_CARE_PROVIDER_SITE_OTHER): Payer: BLUE CROSS/BLUE SHIELD | Admitting: Neurological Surgery

## 2014-03-10 ENCOUNTER — Encounter (INDEPENDENT_AMBULATORY_CARE_PROVIDER_SITE_OTHER): Payer: Self-pay | Admitting: Neurological Surgery

## 2014-03-10 VITALS — BP 133/86 | HR 86 | Temp 97.4°F | Resp 15 | Wt 215.2 lb

## 2014-03-10 DIAGNOSIS — M5430 Sciatica, unspecified side: Secondary | ICD-10-CM

## 2014-03-10 DIAGNOSIS — M5440 Lumbago with sciatica, unspecified side: Secondary | ICD-10-CM

## 2014-03-10 DIAGNOSIS — M545 Low back pain, unspecified: Secondary | ICD-10-CM | POA: Insufficient documentation

## 2014-03-10 NOTE — Progress Notes (Signed)
[facility] Neurosurgery  New Patient Note    Referring MD:  Dennis Bast  Primary Care MD: Christa [facility])    MRN: [id number]    HPI     Chief Complaint   Patient presents with   . Initial Adult nurse (St. Jude)     HPI    Patient ID: Kent Jordan is a 63 y.o. male, DOB 1951/03/13, with h/o lumbar fusion and chronic low back pain for the past 4 years, presents for initial evaluation and discussion of St. Jude spinal cord stimulator. He is s/p successful trial covering all territories of his pain at least 60%. Pain is located across low back, radiating down bilateral posterior thighs, typically aggravated by sitting, standing, and walking. Allev with rest. Describes constant numbness in left foot, intermittent tingling in right leg, intermittent sharp pain in lower back and right buttock, constant dull pain in low back, constant weakness in legs. No loss of bowel or bladder control. Intermittent gait instability in right leg. No change with physical therapy June 2014, temporary improvement with nerve blocks May 2014-2015, temporary improvement with naproxen and Percocet Sep 2012 to present.    Medical History     Past Medical History   Diagnosis Date   . Back pain    . Hypertensive disorder    . High cholesterol    . Arthritis    . Failed back syndrome        Surgical History     Past Surgical History   Procedure Laterality Date   . Lumbar fusion     . Microdiscectomy lumbar       patient had it done 10/12 11/12   . Knee arthrocentesis  1983   . Hernia repair  1972       Family History     Family History   Problem Relation Age of Onset   . Heart disease Mother    . Heart disease Father         Social History     History     Social History   . Marital Status: Single     Spouse Name: N/A     Number of Children: N/A   . Years of Education: N/A     Occupational History   . Not on file.     Social History Main Topics   . Smoking status: Former Games developer   . Smokeless tobacco: Not on file    . Alcohol Use: 1.2 oz/week     2 Cans of beer per week   . Drug Use: Not on file   . Sexual Activity: Not on file     Other Topics Concern   . Not on file     Social History Narrative       Current Medications     Current Outpatient Prescriptions   Medication Sig Dispense Refill   . adalimumab (HUMIRA) 40 MG/0.8ML injection Inject 40 mg into the skin once.     . benazepril (LOTENSIN) 20 MG tablet      . DULoxetine (CYMBALTA) 60 MG capsule Take 60 mg by mouth daily.     . naproxen (NAPROSYN) 500 MG tablet Take 500 mg by mouth 2 (two) times daily with meals.     . rosuvastatin (CRESTOR) 5 MG tablet Take 5 mg by mouth daily.     . Tapentadol HCl (NUCYNTA ER) 200 MG Tablet SR 12 hr Take by mouth.     Marland Kitchen  aspirin 81 MG chewable tablet Chew 81 mg by mouth daily.     . diazepam (VALIUM) 5 MG tablet      . folic acid (FOLVITE) 400 MCG tablet Take 400 mcg by mouth daily.     . methotrexate 2.5 MG tablet      . oxyCODONE-acetaminophen (PERCOCET) 5-325 MG per tablet      . predniSONE (DELTASONE) 5 MG tablet      . ZETIA 10 MG tablet        No current facility-administered medications for this visit.       Allergies     Allergies   Allergen Reactions   . Ciprofloxacin Swelling   . Gabapentin        Review of Systems   Review of Systems  Constitutional: Positive for diaphoresis, activity change, fatigue and unexpected weight change.   HENT: Positive for postnasal drip.   Eyes: Positive for visual disturbance.   Respiratory: Positive for apnea.   Cardiovascular: Negative.   Gastrointestinal: Positive for constipation.   Endocrine: Negative.   Genitourinary: Positive for flank pain and difficulty urinating.   Musculoskeletal: Positive for back pain, joint swelling, arthralgias, gait problem, neck pain and neck stiffness.   Skin: Negative.   Allergic/Immunologic: Negative.   Neurological: Positive for dizziness, weakness, light-headedness, numbness and headaches.   Hematological: Negative.   Psychiatric/Behavioral: Positive for  confusion, sleep disturbance and decreased concentration.     Physical Examination   VITAL SIGNS:   weight is 97.614 kg (215 lb 3.2 oz). His oral temperature is 97.4 F (36.3 C). His blood pressure is 133/86 and his pulse is 86. His respiration is 15.          General:  Well developed, well nourished male, no apparent distress  Neck:  Supple, grossly normal ROM  HEENT:  Head normocephalic, atraumatic, no obvious lesions in ear, nose or throat  Chest:  Equal chest rise.  No wheezes, rales or rhonchi.  Skin:  Pink, warm, dry.  No obvious rash.  Extremities:  Without clubbing or cyanosis    Neurologic Exam  Awake and alert  Fund of knowledge appropriate  Normal attention span, concentration, and language  Speech fluent  Cranial nerves II-XII grossly intact  No pronator drift  Normal muscle bulk and tone  Motor: Strength 4+/5 RLE, otherwise 5/5 BUE/BLE  Sensory: Sensation decreased left foot, otherwise intact to light touch and equal BUE/BLE  Coordination: Normal, no spontaneous abnormal movement, tremor, dysmetria, or ataxia  Gait: Normal  Reflexes:     R L   Brachioradialis   2+ 2+   Biceps     2+ 2+   Patellar    2+ 2+   Achilles    2+ 2+   Hoffman: absent bilaterally  Ankle clonus: absent bilaterally    Radiology Interpretation   MRI lumbar spine with and without contrast   INDICATION: Back pain.     TECHNIQUE: Multiplanar, multisequence MRI of the lumbar spine was  performed before and after administration of 9 mL gadolinium based IV  contrast without adverse event.     FINDINGS: Study assumes 5 lumbar type vertebral bodies. There is mild  straightening of the lumbar spine demonstrated. There is posterior  lumbar fusion hardware demonstrated from L3 through S1. The  susceptibility artifact produced by the hardware limits evaluation of  adjacent osseous and soft tissue structures including the neural  foramen. Patient is status post partial laminectomy at L4-L5.  There is  prominence of  the posterior epidural fat  from T12-L1 through L3-L4, most  prominently at L3-L4 where there is resultant narrowing of the thecal  sac. The vertebral body heights are maintained. There is moderate to  severe disc height loss present at L4-L5 and L5-S1, where interbody  spacers are noted. There is mild disc desiccation and mild disc height  loss at L3-L4. There is increased STIR signal and nonmasslike  enhancement in the surgical bed. The conus medullaris terminates at L1.  No cord signal abnormality is demonstrated.     Level by level:     L1-L2: No significant neuroforaminal narrowing or central canal  stenosis.     L2-L3: Small diffuse disc bulge with mild bilateral facet hypertrophy  and ligamentum flavum infolding resulting in mild bilateral  neuroforaminal narrowing. The disc along with prominence of the  posterior epidural fat results in mild narrowing of the thecal sac which  measures 11 mm AP.     L3-L4: A broad-based disc bulge with bilateral facet hypertrophy and  ligamentum flavum infolding results in moderate neural foraminal  narrowing, right greater than left. The disc and prominent posterior  epidural fat are associated with moderate central canal stenosis, with  narrowing of the thecal sac to 7.5 mm AP.     L4-L5: Partial laminectomy changes are demonstrated on the left side  resulting in posterior decompression of the thecal sac. There is a  broad-based disc bulge with right-sided ligamentum flavum hypertrophy  and facet hypertrophy resulting in moderate bilateral neuroforaminal  narrowing, left greater than right.  There is enhancing tissue within  the left neural foramen abutting the exiting nerve root, consistent with  postsurgical change.     L5-S1: A broad-based disc bulge with mild bilateral facet hypertrophy  associated with mild bilateral neuroforaminal narrowing, left greater  than right. There is enhancing tissue within the left neural foramen  abutting the exiting nerve root, consistent with postsurgical change.      There is mild fatty infiltration of the paraspinal musculature. No fluid  collections are seen. Visualized retroperitoneum is unremarkable.     IMPRESSION:       1. Status post lumbar fusion L3 through S1.  2. Diffuse disc bulge with prominent posterior epidural fat most notably  at L3-L4 which are associated with moderate central canal stenosis and  moderate neuroforaminal narrowing.  3. Mild central canal stenosis and mild neural foraminal narrowing at  L2-L3.  4. Moderate and mild neuroforaminal narrowing at L4-L5 and L5-S1,  respectively, with postsurgical changes.     Gustavus Messing, MD    12/23/2012 9:57 AM    XR Lumbar Spine Including Flexion And Extension Min 6 Views   LUMBAR SPINE: AP, lateral, coned lateral and both oblique  views flexion  and extension views were performed.     CLINICAL STATEMENT: Back pain.     COMPARISON: No prior studies are available for comparison.     FINDINGS: The patient is status post fusion of the lumbar spine with  paired posterior fusion rods and pedicle screws of the L3, L4, L5 and S1  levels. Lucency surrounds the screws within the L3 vertebral body. The  hardware appears intact. Disc spacers are identified within the L4-5 and  L5-S1 disc spaces.       There is no significant change in alignment with flexion and extension.  There is appropriate alignment of the lumbar vertebral bodies.       IMPRESSION:       1.  Patient is status post fusion of the lumbar spine. No significant  change in alignment of the lumbar vertebral bodies in flexion and  extension.  2. Lucency surrounding the pedicle screws within the L3 vertebral body  which may represent loosening.     Fonnie Mu, MD    12/22/2012 10:09 AM    Impression   63 yo male with h/o lumbar fusion, chronic low back and bilateral radicular pain for the past 4 years, s/p successful trial of St. Jude spinal cord stimulator covering all territories of his pain at least 60%.     Plan   The patient was counseled for surgery in  detail to include the risks and complications, benefits and alternatives to surgery. His questions were answered and he desires to move forward with placement of spinal cord stimulator.    Follow-up   Schedule pending. Post-op instructions will be provided.    Sherene Sires, PA-C  This patient was seen and discussed with Tera Helper, MD. Assessment and plan were formulated by Dr. Genia Plants.

## 2014-03-10 NOTE — Progress Notes (Signed)
Review of Systems   Constitutional: Positive for diaphoresis, activity change, fatigue and unexpected weight change.   HENT: Positive for postnasal drip.    Eyes: Positive for visual disturbance.   Respiratory: Positive for apnea.    Cardiovascular: Negative.    Gastrointestinal: Positive for constipation.   Endocrine: Negative.    Genitourinary: Positive for flank pain and difficulty urinating.   Musculoskeletal: Positive for back pain, joint swelling, arthralgias, gait problem, neck pain and neck stiffness.   Skin: Negative.    Allergic/Immunologic: Negative.    Neurological: Positive for dizziness, weakness, light-headedness, numbness and headaches.   Hematological: Negative.    Psychiatric/Behavioral: Positive for confusion, sleep disturbance and decreased concentration.

## 2014-03-24 ENCOUNTER — Encounter (INDEPENDENT_AMBULATORY_CARE_PROVIDER_SITE_OTHER): Payer: Self-pay

## 2014-04-12 ENCOUNTER — Encounter (INDEPENDENT_AMBULATORY_CARE_PROVIDER_SITE_OTHER): Payer: Self-pay | Admitting: Neurological Surgery

## 2014-04-13 ENCOUNTER — Other Ambulatory Visit: Payer: Self-pay | Admitting: Internal Medicine

## 2014-04-13 ENCOUNTER — Ambulatory Visit
Admission: RE | Admit: 2014-04-13 | Discharge: 2014-04-13 | Disposition: A | Discharge status: Home / Self Care | Payer: BLUE CROSS/BLUE SHIELD | Source: Ambulatory Visit | Attending: Internal Medicine | Admitting: Internal Medicine

## 2014-04-13 DIAGNOSIS — Z9889 Other specified postprocedural states: Secondary | ICD-10-CM

## 2014-04-15 ENCOUNTER — Encounter (INDEPENDENT_AMBULATORY_CARE_PROVIDER_SITE_OTHER): Payer: Self-pay | Admitting: Internal Medicine

## 2014-04-15 ENCOUNTER — Ambulatory Visit (INDEPENDENT_AMBULATORY_CARE_PROVIDER_SITE_OTHER): Payer: BLUE CROSS/BLUE SHIELD | Admitting: Internal Medicine

## 2014-04-15 VITALS — BP 130/70 | HR 79 | Resp 16 | Ht 71.0 in | Wt 210.0 lb

## 2014-04-15 DIAGNOSIS — E782 Mixed hyperlipidemia: Secondary | ICD-10-CM | POA: Insufficient documentation

## 2014-04-15 DIAGNOSIS — Z01818 Encounter for other preprocedural examination: Secondary | ICD-10-CM

## 2014-04-15 DIAGNOSIS — E785 Hyperlipidemia, unspecified: Secondary | ICD-10-CM

## 2014-04-15 DIAGNOSIS — I10 Essential (primary) hypertension: Secondary | ICD-10-CM | POA: Insufficient documentation

## 2014-04-15 NOTE — Progress Notes (Signed)
[facility] OFFICE CONSULTATION          I had the pleasure of seeing Kent Jordan today for cardiovascular evaluation. He is a pleasant 63 y.o. male with a history of an abnormal EKG who presents for pre operative evaluation.    Patient pre op for spinal cord stimulator implantation. His pre operative EKG was felt to be abnormal. The patient has no cardiac problems but has essential hypertension ( treated Lotensen 20mg ). No diabetes but a family history of CAD ( mother had an MI ( 60) and his father died in his sleep (22). The patient has an elevated cholesterol ( currently on medications). Works for the [employer]. Sibling one brother died at age 66 (died of liver and kidney failure). Father of three children.     Surgery scheduled for August 13th, 2015    PAST MEDICAL HISTORY:   Past Medical History   Diagnosis Date   . Back pain    . Hypertensive disorder    . High cholesterol    . Arthritis    . Failed back syndrome          MEDICATIONS:     Current Outpatient Prescriptions   Medication Sig Dispense Refill   . aspirin 81 MG chewable tablet Chew 81 mg by mouth daily.     . benazepril (LOTENSIN) 20 MG tablet      . DULoxetine (CYMBALTA) 60 MG capsule Take 60 mg by mouth daily.     . folic acid (FOLVITE) 400 MCG tablet Take 400 mcg by mouth daily.     . naproxen (NAPROSYN) 500 MG tablet Take 500 mg by mouth 2 (two) times daily with meals.     . rosuvastatin (CRESTOR) 5 MG tablet Take 5 mg by mouth daily.     . Tapentadol HCl (NUCYNTA ER) 200 MG Tablet SR 12 hr Take by mouth.     Marland Kitchen adalimumab (HUMIRA) 40 MG/0.8ML injection Inject 40 mg into the skin once.     . diazepam (VALIUM) 5 MG tablet      . methotrexate 2.5 MG tablet      . oxyCODONE-acetaminophen (PERCOCET) 5-325 MG per tablet      . predniSONE (DELTASONE) 5 MG tablet      . ZETIA 10 MG tablet        No current facility-administered medications for this visit.           ALLERGIES:   Allergies   Allergen Reactions   . Ciprofloxacin Swelling   .  Gabapentin          FAMILY HISTORY: His family history includes Heart disease in his father and mother.      SOCIAL HISTORY: He reports that he has quit smoking. He does not have any smokeless tobacco history on file. He reports that he drinks about 1.2 oz of alcohol per week.      REVIEW OF SYSTEMS: All other systems reviewed and negative except as above.         PHYSICAL EXAMINATION  Vital Signs: BP 130/70 mmHg  Pulse 79  Resp 16  Ht 1.803 m (5\' 11" )  Wt 95.255 kg (210 lb)  BMI 29.30 kg/m2   Vital signs reviewed    Wt Readings from Last 3 Encounters:   04/15/14 95.255 kg (210 lb)   03/10/14 97.614 kg (215 lb 3.2 oz)   09/22/13 90.719 kg (200 lb)        General Appearance:  A well-appearing male  in no acute distress.    HEENT: Sclera anicteric, conjunctiva without pallor, moist mucous membranes, normal dentition.   Neck:  Supple without jugular venous distention.  Normal carotid upstrokes without bruits.   Chest: Clear to auscultation bilaterally with good air movement and respiratory effort and no wheezes, rales, or rhonchi   Cardiac: RRR.  Normal S1 and physiologically split S2, without gallops or rub. No murmurs.  PMI of normal size and nondisplaced.   Vascular:  2+ carotid, radial, and distal pulses bilaterally  Abdomen: Soft, nontender, nondistended, with normoactive bowel sounds.  No pulsatile masses, or bruits.   Extremities: Warm without edema, clubbing, or cyanosis.   Skin: No rash, warm, appropriate for race.   Neuro: Alert and oriented x3. Grossly intact.  CN II-XII intact.  Normal mood and affect.       ECG:    Independent review shows: Sinus Rhythm. Left Axis Deviation and poor R wave progression V1-V2      Poor R Wave progression secondary to Left Axis Deviation      ASSESSMENT/PLAN:    1. Pre Operative Cardiovascular Clearance        EKG is normal for body type and Left axis deviation        Asymptomatic but with significant risk factors        Currently no contraindication to planned surgical  procedure  2.   Essential Hypertension  3.   Hyperlipidemia    All patient's questions and concerns regarding cardiovascular disease were answered during this visit.    Plan  No cardiovascular contraindication to planned surgical procedure    Janace Litten, MD  04/15/2014

## 2014-04-27 ENCOUNTER — Ambulatory Visit
Admission: RE | Admit: 2014-04-27 | Discharge: 2014-04-27 | Disposition: A | Discharge status: Home / Self Care | Payer: BLUE CROSS/BLUE SHIELD | Source: Ambulatory Visit | Attending: Pulmonary Disease | Admitting: Pulmonary Disease

## 2014-04-27 ENCOUNTER — Other Ambulatory Visit: Payer: Self-pay | Admitting: Pulmonary Disease

## 2014-04-27 DIAGNOSIS — R911 Solitary pulmonary nodule: Secondary | ICD-10-CM | POA: Insufficient documentation

## 2014-04-29 ENCOUNTER — Ambulatory Visit: Payer: BLUE CROSS/BLUE SHIELD

## 2014-04-29 NOTE — Pre-Procedure Instructions (Addendum)
Pt had pre op BW (BMP)/EKG done 04/14/14 at [facility] office.  Chest xray (abnormal) done 04/14/14 at [facility].  Had CT chest 04/14/14.  EKG, chest xray and CT chest results in EPIC.      Requested LOV note and BW from [facility] via fax.    Requested LOV note and pulmonary studies from Pulmonologist via fax.    Cardiac clearance done 04/15/14 in EPIC.      Sent form to Pain Service regarding pt taking Nucynte.      Pain Specialist:  Dr. Karie Mainland  [phone]  LOV June 2015    Pt to call Surgeons office regarding aspirin.  Pt unsure if Surgeon is aware of taking Aspirin.

## 2014-05-05 ENCOUNTER — Telehealth (INDEPENDENT_AMBULATORY_CARE_PROVIDER_SITE_OTHER): Payer: Self-pay | Admitting: Physician Assistant

## 2014-05-05 NOTE — H&P (Signed)
ADMISSION HISTORY AND PHYSICAL EXAM    Date Time: 05/05/2014 2:07 PM  Patient Name: Kent Jordan  Attending Physician: Tera Helper, *    Assessment:   [phone] PMHx lumbar fusion, neuropathy, HTN. HLD, sleep apnea, with successful pain stimulator trial with St. Jude who presents today for elective dorsal column stimulator placement.     Plan:   1. Plan to proceed with surgery this AM. Consent for surgery obtained  2. Pre-op abx and SCDs ordered  3. Plan for d/c home later today  4. Post-op instructions will be provided  5. Follow-up in 10 days for post-op wound check    History of Present Illness:   Kent Jordan is a 63 y.o. male PMHx lumbar fusion and chronic low back pain for the past 4 years, presents for elective placement of Dorsal column stimulator. He is s/p successful trial covering all territories of his pain at least 60%. Pain is located across low back, radiating down bilateral posterior thighs R>L, typically aggravated by sitting, standing, and walking. Pt notes that RLE radicular pain is most severe and typically extends to the level of the knee, but intermittently radiated to ankle. Pain is alleviated with rest. Describes constant numbness in left foot big toe. No loss of bowel or bladder control. Intermittent gait instability in right leg. No change with physical therapy June 2014, temporary improvement with nerve blocks May 2014-2015, temporary improvement with naproxen and Percocet Sep 2012 to present.    Patient obtained cardiac clearance on 04/15/14 by Dr. Janace Litten, MD, [facility].     Past Medical History:     Past Medical History   Diagnosis Date   . Back pain    . Hypertensive disorder    . High cholesterol    . Arthritis    . Failed back syndrome    . Pneumonia 11/2009   . Bilateral cataracts      removed 2009   . Sleep apnea      Does not use CPAP   . Neuropathy      Tingling left foot, numbness left toes   . Low back pain    . Difficulty in walking(719.7)      Uses cane to  ambulate   . Elevated liver function tests        Past Surgical History:     Past Surgical History   Procedure Laterality Date   . Lumbar fusion  10/2011   . Microdiscectomy lumbar  11/12     patient had it done 10/12 11/12   . Knee arthrocentesis  1983     right   . Abdominal surgery  03/1971     double hernia repair   . Eye surgery  2009     cataract removal, both eyes    . Back surgery  9/12, 11/12, 2/13     + L-3 to S-1 fusion   . Fracture surgery  1993     right middle finger       Family History:     Family History   Problem Relation Age of Onset   . Heart disease Mother    . Heart disease Father        Social History:     History     Social History   . Marital Status: Single     Spouse Name: N/A     Number of Children: N/A   . Years of Education: N/A     Social  History Main Topics   . Smoking status: Former Smoker -- 1.00 packs/day for 41 years     Quit date: 09/26/2011   . Smokeless tobacco: Never Used   . Alcohol Use: 1.2 oz/week     2 Cans of beer per week   . Drug Use: No   . Sexual Activity:     Partners: Female     Pharmacist, hospital Protection: Surgical     Other Topics Concern   . Not on file     Social History Narrative       Allergies:     Allergies   Allergen Reactions   . Ciprofloxacin Swelling   . Gabapentin Other (See Comments)     blurred vision, dizziness       Medications:   Of note: patient stopped his ASA 81mg  on 04/29/14      No current facility-administered medications on file prior to encounter.     Current Outpatient Prescriptions on File Prior to Encounter   Medication Sig Dispense Refill   . adalimumab (HUMIRA) 40 MG/0.8ML injection Inject 40 mg into the skin once. Last injection 04/29/14       . aspirin 81 MG chewable tablet Chew 81 mg by mouth daily.     . benazepril (LOTENSIN) 20 MG tablet Take 20 mg by mouth daily.        . DULoxetine (CYMBALTA) 60 MG capsule Take 60 mg by mouth daily.     . folic acid (FOLVITE) 400 MCG tablet Take 400 mcg by mouth daily.     .  rosuvastatin (CRESTOR) 5 MG tablet Take 5 mg by mouth daily.     . Tapentadol HCl (NUCYNTA ER) 200 MG Tablet SR 12 hr Take 1 tablet by mouth 2 (two) times daily.        Marland Kitchen ZETIA 10 MG tablet Take 10 mg by mouth daily.        . diazepam (VALIUM) 5 MG tablet Take 5 mg by mouth every 8 (eight) hours as needed.        . naproxen (NAPROSYN) 500 MG tablet Take 500 mg by mouth 2 (two) times daily with meals.         Review of Systems:   Constitutional: Positive for diaphoresis, activity change, fatigue and unexpected weight change.   Genitourinary: Positive for flank pain and difficulty urinating.   Musculoskeletal: Positive for back pain, joint swelling, arthralgias, gait problem, neck pain and neck stiffness.     Physical Exam:   There were no vitals filed for this visit.    Intake and Output Summary (Last 24 hours) at Date Time  No intake or output data in the 24 hours ending 05/05/14 1407      GENERAL: normocephalic, atraumatic, in no apparent distress  HEART: RRR, no murmurs appreciated  LUNG: CTA, no rhales or rhonchi    NEURO:  Awake and alert  Fund of knowledge appropriate  Normal attention span, concentration, and language  Speech fluent  Cranial nerves II-XII grossly intact  Normal muscle bulk and tone  Motor: Strength 4+/5 RLE, otherwise 5/5 BUE/BLE  Sensory: Sensation decreased left foot, otherwise intact to light touch and equal BUE/BLE  Coordination: Normal, no spontaneous abnormal movement, tremor, dysmetria, or ataxia  Gait: Normal  Reflexes: R L  Brachioradialis 2+ 2+  Biceps 2+ 2+  Patellar 2+ 2+  Achilles 2+ 2+   Hoffman: absent bilaterally  Ankle clonus: absent bilaterally      Labs:   Outside  labs from [facility]: CBC, CMP, UA all WNL. Scanned into EPIC media tab    Rads:   04/15/14:  ECG outside scan: NSR with left axis deviation and poor R wave progression V1-2.     04/27/14:  Chest CT WO Contrast   Study Result    CLINICAL INDICATION: Lung nodule  TECHNIQUE: Axial noncontrast CT images were obtained  through the thorax  from the thoracic inlet through the lung bases.   COMPARISON: 09/11/2013  INTERPRETATION: Coronary artery calcifications. No pleural or  pericardial effusion. No lymphadenopathy. 8 x 6 mm noncalcified nodule  in the left lower lobe, image 54, unchanged since 09/09/2012. Mild  dependent changes in the lungs. Some reticular opacities at the left  lung base may represent early fibrotic change or mild atelectasis.  Previously seen groundglass opacity in the lingula is nearly resolved.  In the upper abdomen, marked fatty change of the liver. Visualized  adrenals unremarkable.  IMPRESSION:   Stable 8 mm left lower lobe pulmonary nodule, unchanged  since 09/09/2012. Follow-up recommendations detailed below.  Fleischner Society Recommendations for Follow-up and Management of  Nodules Detected Incidentally on CT   Nodule size Low risk Patient High Risk Patient  (mm)  < or + to 4 No F/U needed Followup CT at 12  months;  If unchanged, no further F/U   >4-6 F/U CT at 12 months Initial F/U CT at   6-12 mo   If unchanged, no F/U then at 18-24  mo if no change  >6-8 Initial F/U CT at 6-12 mo Initial F/U CT at  3-6 mo then  then at 18-24 mo if no at 9-12 and  24 mo if no change   change  >8 F/U CT at around 3,9, and Same as for low  risk patient  52mo, dynamic contrast  enhanced CT, PET, and/  or biopsy  Filbert Schilder, MD   04/27/2014 4:23 PM         Signed by: Ellery Plunk PA-C

## 2014-05-06 NOTE — Progress Notes (Signed)
POST-OP CHECK     Pt is POD#0 s/p DCS Placement   Waking in PACU at this time  C/O appropriate mid-thoracic back pain at incision, denies L buttock pain at this time  Denies nausea    EXAM  Filed Vitals:    05/07/14 1117   BP: 124/73   Pulse: 96   Temp: 98.6 F (37 C)   Resp: 18   SpO2: 94%       Awake, somewhat lethargic, O x 3, somewhat uncomfortable with mid-back pain   PERRL 3mm, EOMI  Cranial nerves II-XII grossly intact  Face symmetric, tongue midline  Shoulder shrug intact  BUE good symmetric strength bilaterally   BLE good symmetric strength bilaterally   Sensation intact to LT      SCIP INDICATOR     Inf-1 Abx w/in one hr prior to incision __x___Yes  Time:   0847    AM No_______ N/A_______   Inf-3 Abx discontinued w/in 24 hours after surgery end time. __x__Yes  Discontinue date: 05/07/14 No________   Reason:Treatment for possible infection_____   Surg drain treat possible infection_____     Inf-9 Foley D/C POD 1 or 2 No foley placed  No________  Reason:    *In ICU on drips, requires strict fluid mgmt   VTE-2 VTE proph 24 hours before to after surgery ___x__Yes  Reason: SCD's on patient and ordered No_____  Reason: if no SCD's post op bleeding risk. Will start on POD#2   Card-2 Beta blocker therapy prior to arrival who received beta blocker during perioperative period ______Yes No__X__  Reason: Patient not on pre-operative beta blocker therapy  BB held for hypotension        Kent Jordan: 63yoM PMHx lumbar fusion and chronic back pain s/p DCS placement.     1. Ok to d/c home later today  2. Will provide post-op instructions  3. Pain meds PRN. Rx will be provided  4. Follow-up in clinic in 7-10 days for wound check      Aura Camps PA-C

## 2014-05-07 ENCOUNTER — Ambulatory Visit: Payer: BLUE CROSS/BLUE SHIELD | Admitting: Radiology

## 2014-05-07 ENCOUNTER — Ambulatory Visit: Payer: BLUE CROSS/BLUE SHIELD | Admitting: Certified Registered"

## 2014-05-07 ENCOUNTER — Ambulatory Visit
Admission: RE | Admit: 2014-05-07 | Discharge: 2014-05-07 | Disposition: A | Discharge status: Home / Self Care | Payer: BLUE CROSS/BLUE SHIELD | Source: Ambulatory Visit | Attending: Neurological Surgery | Admitting: Neurological Surgery

## 2014-05-07 ENCOUNTER — Ambulatory Visit: Payer: BLUE CROSS/BLUE SHIELD | Admitting: Neurological Surgery

## 2014-05-07 ENCOUNTER — Encounter
Admission: RE | Disposition: A | Discharge status: Home / Self Care | Payer: Self-pay | Source: Ambulatory Visit | Attending: Neurological Surgery

## 2014-05-07 DIAGNOSIS — I10 Essential (primary) hypertension: Secondary | ICD-10-CM | POA: Insufficient documentation

## 2014-05-07 DIAGNOSIS — M129 Arthropathy, unspecified: Secondary | ICD-10-CM | POA: Insufficient documentation

## 2014-05-07 DIAGNOSIS — G4730 Sleep apnea, unspecified: Secondary | ICD-10-CM | POA: Insufficient documentation

## 2014-05-07 DIAGNOSIS — E785 Hyperlipidemia, unspecified: Secondary | ICD-10-CM | POA: Insufficient documentation

## 2014-05-07 DIAGNOSIS — M545 Low back pain, unspecified: Secondary | ICD-10-CM | POA: Insufficient documentation

## 2014-05-07 DIAGNOSIS — E7800 Pure hypercholesterolemia, unspecified: Secondary | ICD-10-CM | POA: Insufficient documentation

## 2014-05-07 DIAGNOSIS — M5430 Sciatica, unspecified side: Secondary | ICD-10-CM

## 2014-05-07 DIAGNOSIS — Z87891 Personal history of nicotine dependence: Secondary | ICD-10-CM | POA: Insufficient documentation

## 2014-05-07 DIAGNOSIS — G8929 Other chronic pain: Secondary | ICD-10-CM | POA: Insufficient documentation

## 2014-05-07 DIAGNOSIS — G589 Mononeuropathy, unspecified: Secondary | ICD-10-CM | POA: Insufficient documentation

## 2014-05-07 DIAGNOSIS — Z7982 Long term (current) use of aspirin: Secondary | ICD-10-CM | POA: Insufficient documentation

## 2014-05-07 DIAGNOSIS — Z79899 Other long term (current) drug therapy: Secondary | ICD-10-CM | POA: Insufficient documentation

## 2014-05-07 HISTORY — DX: Other specified abnormal findings of blood chemistry: R79.89

## 2014-05-07 HISTORY — DX: Difficulty in walking, not elsewhere classified: R26.2

## 2014-05-07 HISTORY — DX: Polyneuropathy, unspecified: G62.9

## 2014-05-07 HISTORY — PX: INSERTION, SPINAL CORD STIMULATOR GENERATOR: SHX4394

## 2014-05-07 SURGERY — INSERTION, SPINAL CORD STIMULATOR GENERATOR
Anesthesia: Anesthesia MAC / Sedation | Site: Back | Wound class: Clean

## 2014-05-07 MED ORDER — LIDOCAINE-EPINEPHRINE 1 %-1:100000 IJ SOLN
INTRAMUSCULAR | Status: DC | PRN
Start: 2014-05-07 — End: 2014-05-07
  Administered 2014-05-07: 17 mL

## 2014-05-07 MED ORDER — SODIUM CHLORIDE 0.9 % IJ SOLN
INTRAMUSCULAR | Status: AC
Start: 2014-05-07 — End: ?
  Filled 2014-05-07: qty 10

## 2014-05-07 MED ORDER — LACTATED RINGERS IV SOLN
INTRAVENOUS | Status: DC | PRN
Start: 2014-05-07 — End: 2014-05-07

## 2014-05-07 MED ORDER — BACITRACIN 50000 UNITS IM SOLR
INTRAMUSCULAR | Status: DC | PRN
Start: 2014-05-07 — End: 2014-05-07
  Administered 2014-05-07: 50000 [IU]

## 2014-05-07 MED ORDER — EPHEDRINE SULFATE 50 MG/ML IJ SOLN
INTRAMUSCULAR | Status: AC
Start: 2014-05-07 — End: ?
  Filled 2014-05-07: qty 1

## 2014-05-07 MED ORDER — SODIUM CHLORIDE 0.9 % IR SOLN
Status: DC | PRN
Start: 2014-05-07 — End: 2014-05-07
  Administered 2014-05-07 (×2): 500 mL

## 2014-05-07 MED ORDER — GLYCOPYRROLATE 0.2 MG/ML IJ SOLN
INTRAMUSCULAR | Status: AC
Start: 2014-05-07 — End: ?
  Filled 2014-05-07: qty 1

## 2014-05-07 MED ORDER — MIDAZOLAM HCL 2 MG/2ML IJ SOLN
INTRAMUSCULAR | Status: DC | PRN
Start: 2014-05-07 — End: 2014-05-07
  Administered 2014-05-07: 2 mg via INTRAVENOUS

## 2014-05-07 MED ORDER — LIDOCAINE HCL 2 % IJ SOLN
INTRAMUSCULAR | Status: DC | PRN
Start: 2014-05-07 — End: 2014-05-07
  Administered 2014-05-07: 25 mg via INTRAVENOUS

## 2014-05-07 MED ORDER — FENTANYL CITRATE 0.05 MG/ML IJ SOLN
25.0000 ug | INTRAMUSCULAR | Status: DC | PRN
Start: 2014-05-07 — End: 2014-05-07
  Administered 2014-05-07 (×2): 25 ug via INTRAVENOUS

## 2014-05-07 MED ORDER — FENTANYL CITRATE 0.05 MG/ML IJ SOLN
INTRAMUSCULAR | Status: DC | PRN
Start: 2014-05-07 — End: 2014-05-07
  Administered 2014-05-07 (×4): 25 ug via INTRAVENOUS

## 2014-05-07 MED ORDER — PROPOFOL INFUSION 10 MG/ML
INTRAVENOUS | Status: DC | PRN
Start: 2014-05-07 — End: 2014-05-07
  Administered 2014-05-07: 40 mg via INTRAVENOUS

## 2014-05-07 MED ORDER — THROMBIN 5000 UNITS EX SOLR
CUTANEOUS | Status: AC
Start: 2014-05-07 — End: ?
  Filled 2014-05-07: qty 5000

## 2014-05-07 MED ORDER — ONDANSETRON HCL 4 MG/2ML IJ SOLN
4.0000 mg | Freq: Once | INTRAMUSCULAR | Status: DC | PRN
Start: 2014-05-07 — End: 2014-05-07

## 2014-05-07 MED ORDER — LACTATED RINGERS IV SOLN
INTRAVENOUS | Status: DC
Start: 2014-05-07 — End: 2014-05-07

## 2014-05-07 MED ORDER — FENTANYL CITRATE 0.05 MG/ML IJ SOLN
INTRAMUSCULAR | Status: AC
Start: 2014-05-07 — End: 2014-05-07
  Administered 2014-05-07: 25 ug via INTRAVENOUS
  Filled 2014-05-07: qty 2

## 2014-05-07 MED ORDER — PROPOFOL 10 MG/ML IV EMUL
INTRAVENOUS | Status: AC
Start: 2014-05-07 — End: ?
  Filled 2014-05-07: qty 100

## 2014-05-07 MED ORDER — BACITRACIN 50000 UNITS IM SOLR
INTRAMUSCULAR | Status: AC
Start: 2014-05-07 — End: ?
  Filled 2014-05-07: qty 50000

## 2014-05-07 MED ORDER — CEFAZOLIN SODIUM-DEXTROSE 2-3 GM-% IV SOLR
2.0000 g | Freq: Once | INTRAVENOUS | Status: AC
Start: 2014-05-07 — End: 2014-05-07
  Administered 2014-05-07: 2 g via INTRAVENOUS

## 2014-05-07 MED ORDER — KETAMINE HCL 50 MG/ML IJ SOLN
INTRAMUSCULAR | Status: DC | PRN
Start: 2014-05-07 — End: 2014-05-07
  Administered 2014-05-07 (×2): 5 mg via INTRAVENOUS
  Administered 2014-05-07: 15 mg via INTRAVENOUS
  Administered 2014-05-07: 5 mg via INTRAVENOUS

## 2014-05-07 MED ORDER — KETAMINE HCL 50 MG/ML IJ SOLN
INTRAMUSCULAR | Status: AC
Start: 2014-05-07 — End: ?
  Filled 2014-05-07: qty 10

## 2014-05-07 MED ORDER — OXYCODONE-ACETAMINOPHEN 5-325 MG PO TABS
1.0000 | ORAL_TABLET | Freq: Once | ORAL | Status: DC | PRN
Start: 2014-05-07 — End: 2014-05-07

## 2014-05-07 MED ORDER — PROPOFOL INFUSION 10 MG/ML
INTRAVENOUS | Status: DC | PRN
Start: 2014-05-07 — End: 2014-05-07
  Administered 2014-05-07: 140 ug/kg/min via INTRAVENOUS

## 2014-05-07 MED ORDER — FENTANYL CITRATE 0.05 MG/ML IJ SOLN
INTRAMUSCULAR | Status: AC
Start: 2014-05-07 — End: ?
  Filled 2014-05-07: qty 2

## 2014-05-07 MED ORDER — HYDROMORPHONE HCL PF 1 MG/ML IJ SOLN
0.2000 mg | INTRAMUSCULAR | Status: DC | PRN
Start: 2014-05-07 — End: 2014-05-07

## 2014-05-07 MED ORDER — KETAMINE HCL 10 MG/ML IJ SOLN
INTRAMUSCULAR | Status: DC | PRN
Start: 2014-05-07 — End: 2014-05-07

## 2014-05-07 MED ORDER — PROMETHAZINE HCL 25 MG/ML IJ SOLN
6.2500 mg | Freq: Once | INTRAMUSCULAR | Status: DC | PRN
Start: 2014-05-07 — End: 2014-05-07

## 2014-05-07 MED ORDER — PROPOFOL 10 MG/ML IV EMUL
INTRAVENOUS | Status: AC
Start: 2014-05-07 — End: ?
  Filled 2014-05-07: qty 20

## 2014-05-07 MED ORDER — OXYCODONE-ACETAMINOPHEN 5-325 MG PO TABS
1.0000 | ORAL_TABLET | ORAL | Status: DC | PRN
Start: 2014-05-07 — End: 2015-12-12

## 2014-05-07 MED ORDER — CEFAZOLIN SODIUM-DEXTROSE 2-3 GM-% IV SOLR
INTRAVENOUS | Status: AC
Start: 2014-05-07 — End: ?
  Filled 2014-05-07: qty 50

## 2014-05-07 MED ORDER — GELATIN ABSORBABLE 100 EX MISC
CUTANEOUS | Status: AC
Start: 2014-05-07 — End: ?
  Filled 2014-05-07: qty 1

## 2014-05-07 MED ORDER — SODIUM CHLORIDE BACTERIOSTATIC 0.9 % IJ SOLN
INTRAMUSCULAR | Status: DC | PRN
Start: 2014-05-07 — End: 2014-05-07
  Administered 2014-05-07: 30 mL

## 2014-05-07 MED ORDER — LIDOCAINE-EPINEPHRINE 1 %-1:100000 IJ SOLN
INTRAMUSCULAR | Status: AC
Start: 2014-05-07 — End: ?
  Filled 2014-05-07: qty 80

## 2014-05-07 MED ORDER — SUCCINYLCHOLINE CHLORIDE 20 MG/ML IJ SOLN
INTRAMUSCULAR | Status: AC
Start: 2014-05-07 — End: ?
  Filled 2014-05-07: qty 10

## 2014-05-07 MED ORDER — GLYCOPYRROLATE 0.2 MG/ML IJ SOLN
INTRAMUSCULAR | Status: DC | PRN
Start: 2014-05-07 — End: 2014-05-07
  Administered 2014-05-07: 0.2 mg via INTRAVENOUS

## 2014-05-07 MED ORDER — SODIUM CHLORIDE BACTERIOSTATIC 0.9 % IJ SOLN
INTRAMUSCULAR | Status: AC
Start: 2014-05-07 — End: ?
  Filled 2014-05-07: qty 30

## 2014-05-07 MED ORDER — MIDAZOLAM HCL 2 MG/2ML IJ SOLN
INTRAMUSCULAR | Status: AC
Start: 2014-05-07 — End: ?
  Filled 2014-05-07: qty 2

## 2014-05-07 SURGICAL SUPPLY — 77 items
BANDAGE ADH PLSTR POLYACRYLATE CVRL 2YD (Bandage) ×1
BANDAGE ADHESIVE L2 YD X W6 IN STRETCH (Bandage) ×1
BANDAGE ADHESIVE L2 YD X W6 IN STRETCH NONWOVEN POROUS COVER-ROLL (Bandage) ×1 IMPLANT
BANDAGE STERI-STRIP 0.5X4IN (Dressing) ×1
BLADE S/SU RIBBACK CARB STL 11 (Blade) ×2 IMPLANT
BLADE SAFETY LOCK #10 (Blade) ×2 IMPLANT
BUR MATCH 9CM X 3MM (Burr) ×1
CABLE TRAIL MULTILEAD (Cable) ×3
CHARGING SYSTEM PROTEGE (Stimulator) ×2 IMPLANT
DRAPE 74X41IN UNIVERSAL POLY XRAY C ARM CLOSURE STRAP (Drape) ×1 IMPLANT
DRAPE EQP VLCR POLY UNV STRDRP 74X41IN (Drape) ×2
DRAPE INCISE IOBAN-2 60X35CM (Drape) ×2 IMPLANT
DRAPE XRAY C-ARM 27X17X70 (Drape) ×1
DRESSING SCR TGDRM 4.5X3.5IN LF STRL FLM (Dressing) ×2
DRESSING SECUREMENT TEGADERM L4 1/2 IN X (Dressing) ×2
DRESSING SECUREMENT TEGADERM L4 1/2 IN X W3 1/2 IN INTRAVENOUS FILM (Dressing) IMPLANT
DRESSING STRETCH GAUZE 6INX2YD (Bandage) ×1
DRESSING TEGADERM 3.5X4.5IN (Dressing) ×2
DRESSING TEGADERM FRAME 6X7CM (Dressing) ×2
DRESSING TRANSPARENT L2 3/4 IN X W2 3/8 (Dressing) ×2
DRESSING TRANSPARENT L2 3/4 IN X W2 3/8 IN POLYURETHANE ADHESIVE (Dressing) ×2 IMPLANT
DRESSING TRNS PU STD TGDRM 2.75INX2 3/8 (Dressing) ×2
GAUZE SPONGE 4X4 NS (Dressing) ×1
GLOVE BIOGEL SURGCL INDICTR 8 (Glove) ×2
GLOVE SRG NTR RBR 8 INDCTR BGL 299X103MM (Glove) ×2
GLOVE SURG BIOGEL SZ7.5 (Glove) ×6 IMPLANT
GLOVE SURGICAL 8 INDICATOR BIOGEL POWDER (Glove) ×2
GLOVE SURGICAL 8 INDICATOR BIOGEL POWDER FREE SMOOTH BEAD CUFF (Glove) ×2 IMPLANT
IPG PROTEGE 16 CHANNEL RECHRBL (Generator) ×2 IMPLANT
KIT DURAPREP ANTIMICROBIAL (Prep) ×1
LEAD ADNEU PENTA 3MM-60CM (Leads) ×1 IMPLANT
LEAD NEUROSTIMULATOR L60 CM PADDLE 16 CHANNEL PENTA 3 MM (Leads) IMPLANT
LEAD NRSTM 3MM PENTA 60CM PDL 16 CHNL (Leads) ×2 IMPLANT
MASTISOL VIAL 2/3CC STRL (Skin Closure) ×3 IMPLANT
NEEDLE REG BEVEL 19GX1.5IN (Needles) ×9 IMPLANT
NEEDLE SPINAL DISP 18GX3.5IN (Needles) ×3 IMPLANT
NERVE STIMULATOR SPECTRA PRBE CABLE-SCREENING SPNL (Cable) IMPLANT
PACK SRG TBRN UNV LF STRL ABS REINF ADH (Pack) ×1
PACK SURGICAL UNIVERSAL ABSORBENT (Pack) ×1
PACK SURGICAL UNIVERSAL ABSORBENT REINFORCE ADHESIVE CORD HOLD STRAP (Pack) ×1 IMPLANT
PACK UNIVERSAL PROCED (Pack) ×1
PAD ELECTROSRG GRND REM W CRD (Procedure Accessories) ×3 IMPLANT
POSITIONER HEAD SLOTTED ADLT (Positioning Supplies) ×3 IMPLANT
POUCH INST 18X30CM (Drape) ×3 IMPLANT
PROGRAMMER PROT?G?PATIENT (Stimulator) ×2 IMPLANT
SLEEVE SEQUEN COMP KNEE REG (Procedure Accessories) ×3 IMPLANT
SOL NACL.9% 1000ML IRR NONLTX (Irrigation Solutions) ×1
SOLUTION IRR 0.9% NACL 1000ML LF STRL (Irrigation Solutions) ×1
SOLUTION IRRIGATION 0.9% SODIUM CHLORIDE (Irrigation Solutions) ×1
SOLUTION IRRIGATION 0.9% SODIUM CHLORIDE 1000 ML PLASTIC POUR BOTTLE (Irrigation Solutions) ×1 IMPLANT
SOLUTION SRGPRP 74% ISPRP 0.7% IOD (Prep) ×1
SOLUTION SURGICAL PREP 26 ML DURAPREP (Prep) ×1
SOLUTION SURGICAL PREP 26 ML DURAPREP 74% ISOPROPYL ALCOHOL 0.7% (Prep) ×1 IMPLANT
SPONGE GAUZE L4 IN X W4 IN 16 PLY (Dressing) ×1
SPONGE GAUZE L4 IN X W4 IN 16 PLY MAXIMUM ABSORBENT USP TYPE VII (Dressing) IMPLANT
SPONGE GZE CTTN CRTY 4X4IN LF NS 16 PLY (Dressing) ×1
STRIP SKIN CLOSURE L4 IN X W1/2 IN (Dressing) ×1
STRIP SKIN CLOSURE L4 IN X W1/2 IN REINFORCE STERI-STRIP POLYESTER (Dressing) ×1 IMPLANT
STRIP SKNCLS PLSTR STRSTRP 4X.5IN LF (Dressing) ×1
SUTURE ABS 4-0 PS2 MNCRL MTPS 18IN MFL (Suture)
SUTURE MONOCRYL 4-0 PS-2 L18 IN (Suture)
SUTURE MONOCRYL 4-0 PS-2 L18 IN MONOFILAMENT UNDYED ABSORBABLE (Suture) IMPLANT
SUTURE MONOCRYL 4-0 PS2 18 IN (Suture)
SUTURE NABSB SLK 2-0 PRMHND 18IN BRD TIE (Suture) ×1
SUTURE SILK 2-0 18IN (Suture) ×1
SUTURE SILK 2-0 SH 30IN (Suture) ×4 IMPLANT
SUTURE SILK 3-0 (Suture) ×6 IMPLANT
SUTURE SILK PERMA HAND BLACK 2-0 L18 IN (Suture) ×1
SUTURE SILK PERMA HAND BLACK 2-0 L18 IN BRAID TIES 12 STRAND PRECUT (Suture) ×1 IMPLANT
SUTURE VICRYL 2-0 CP2 8X18IN (Suture) ×4 IMPLANT
SUTURE VICRYL 3-0 CP2 8X18IN (Suture) ×6 IMPLANT
SUTURE VICRYL 4-0 PS2 27IN (Suture) ×4 IMPLANT
SYRINGE LUER LOCK 10CC (Syringes, Needles) ×5 IMPLANT
TOOL DISSECTING L9 CM MATCH HEAD FLUTE (Burr) ×1
TOOL DISSECTING L9 CM MATCH HEAD FLUTE OD3 MM MIDAS REX LEGEND (Burr) ×1 IMPLANT
TOOL DSCT MTCH HD LGND 3MM 9CM (Burr) ×1
TOWEL STERILE REUSABLE 8PK (Procedure Accessories) ×3 IMPLANT

## 2014-05-07 NOTE — Anesthesia Preprocedure Evaluation (Addendum)
Anesthesia Evaluation    AIRWAY    Mallampati: III    TM distance: >3 FB  Neck ROM: full  Mouth Opening:full   CARDIOVASCULAR    regular and normal       DENTAL    no notable dental hx     PULMONARY    clear to auscultation     OTHER FINDINGS                    Anesthesia Plan    ASA 3     MAC               (Patient interviewed, examined.  Reviewed PCP evaluations, consultations, labs and imaging studies.   Discussed intra-op pt's cooperation for testing the leads. Pt agrees.  Discussed MAC, possible general anesthesia with airway device..    The most common side effects from general anesthesia are nausea, vomiting, sore throat.  Uncommon side effects include but not limited to  injury to eyes, lips, teeth, gums, vocal cords, allergic reactions, nerve injuries. In addition there are side effects associated with your medical conditions.    Answered all questions to patient's satisfaction.  Pt understands and wishes to proceed.    Wandra Mannan, MD  05/07/2014 )      intravenous induction   Detailed anesthesia plan: MAC        Post op pain management: per surgeon    informed consent obtained    Plan discussed with CRNA.    ECG reviewed    imaging results reviewed

## 2014-05-07 NOTE — OR PreOp (Signed)
Pt has a StopBang of  6. Pt  stated he has sleep apnea, do not use CPAP. OSA protocol initiated

## 2014-05-07 NOTE — Discharge Instructions (Signed)
Discharge Instructions for Dorsal Column Stimulator Insertion  Surgeon: Dr. Paulette Blanch   Office Location and Phone #: [phone]                            [address], [place], [place]                                                    [place]       [address], [place], [place]   Your Surgery/Date: 05/07/14     Activity   . No lifting greater than 10 lbs   . No strenuous activity (running, exercise, excessive bending).  You may walk and go up stairs as tolerated.  . You may remove your bandage (if not already done in the hospital) and shower 3 days after your surgery.  Please keep the incision as dry as possible, cover with plastic or avoid direct spray to that site.  Do not soak in the bathtub.  Gently pat dry.  . You may have either staples or steri strips (white stickers) under your dressing.  Steri strips will fall off in 1-2 weeks.   . Do not apply any creams or ointments to your incision site.   . You may apply an ice pack to your incision as needed for pain.    Medications  . Please resume all of your home medications EXCEPT FOR aspirin, Coumadin, Plavix, Pradaxa, and any anti-inflammatory medications (Motrin, Aleve, Advil) or other blood thinning medications.   . The following pain medications were prescribed to you:  o Percocet 5/325 mg 1-2 tablets every 4-6 hours as needed for pain   o You may also take Tylenol 650 mg 1 tablet every 6 hours INSTEAD of Percocet for pain if your pain is not severe.    . Please take Colace 100 mg 1 tablet every 12 hours for constipation while taking pain medication.  You may also drink prune juice or take over the counter Senokot for constipation.      Follow Up    . Make an appointment with the Neurosurgery clinic to see Kent Jordan in 10-14 days for a wound check.   . You should also make an appointment to see Kent Jordan in 4 weeks for follow up.   Kent Jordan should see your primary care doctor or pain specialist in 1-2 weeks.     Other  Instructions  . Please call the office or go to the emergency room if you experience fever higher than 101.5 degrees Fahrenheit, severe nausea and vomiting, severe pain not responsive to medications.   Please call immediately if your incision site has bloody or purulent discharge, is inflamed or appears to have opened.   Post Anesthesia Discharge Instructions    Although you may be awake and alert in the recovery room, small amounts of anesthetic remain in your system for about 24 hours.  You may feel tired and sleepy during this time.      You are advised to go directly home from the hospital.    Plan to stay at home and rest for the remainder of the day.    It is advisable to have someone with you at home for 24 hours after surgery.    Do not  operate a motor vehicle, or any mechanical or electrical equipment for the next 24 hours.      Be careful when you are walking around, you may become dizzy.  The effects of anesthesia and/or medications are still present and drowsiness may occur    Do not consume alcohol, tranquilizers, sleeping medications, or any other non prescribed medication for the remainder of the day.    Diet:  begin with liquids, progress your diet as tolerated or as directed by your surgeon.  Nausea and vomiting may occur in the next 24 hours.  Marland Kitchen

## 2014-05-07 NOTE — Brief Op Note (Signed)
BRIEF OP NOTE    Date Time: 05/07/2014 11:22 AM    Patient Name:   Kent Jordan    Date of Operation:   05/07/2014    Providers Performing:   Surgeon(s):  Leiphart, Arlan Organ, MD  Ellery Plunk, PA    Assistant (s):   Circulator: Nat Math, RN  Relief Circulator: Wess Botts, Okey Regal, RN  Scrub Person: Staci Acosta    Operative Procedure:   Procedure(s):  DORSAL COLUMN STIMULATOR PLACEMENT    Preoperative Diagnosis:   Pre-Op Diagnosis Codes:     * Low back pain, unspecified back pain laterality, with sciatica presence unspecified [724.2]    Postoperative Diagnosis:   Post-Op Diagnosis Codes:     * Low back pain, unspecified back pain laterality, with sciatica presence unspecified [724.2]    Anesthesia:   Monitor Anesthesia Care    Estimated Blood Loss:   50ml    Implants:     Implant Name Type Inv. Item Serial No. Manufacturer Lot No. LRB No. Used Action   LEAD ADNEU PENTA 3MM-60CM - [id number] Leads LEAD ADNEU PENTA 3MM-60CM [id number] [facility] [employer] [id number] N/A 1 Implanted   IPG PROTEGE 16 CHANNEL RECHRBL - [id number] Generator IPG PROTEGE 16 CHANNEL RECHRBL [id number] [employer] [id number] N/A 1 Implanted       Drains:   None     Specimens:   n/a    Findings:       Complications:   None       Signed by: Ellery Plunk, PA-C                                                                           [facility]

## 2014-05-07 NOTE — Op Note (Signed)
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     TITLE OF PROCEDURE:  1.  Laminotomy for placement of dorsal column stimulator paddle electrode.  2.  Placement of dorsal column stimulator generator and attachment to  paddle electrode.     ANESTHESIA:  Local with sedation.     INDICATIONS:  The patient is a 63 year old male with chronic intractable low back and  right radicular pain.  The patient had a successful dorsal column  stimulator trial and consented to the placement of permanent system.   Risks, potential benefits, and alternatives to the surgery were discussed  with patient including risks of bleeding, infection, cerebrospinal fluid  leak, weakness, paralysis, sensory loss, loss of bowel or bladder control,  need for reoperation, and anesthesia risks.  Following full discussion of  the risks and potential benefits, the patient gave full informed consent to  surgery.     DESCRIPTION OF PROCEDURE:  The patient was brought to the operating room, sedation was started.  The  patient was placed in the prone position.  Fluoroscopy was used for  guidance throughout the case and used to select the incision in the midline  thoracic region and in the left buttock area were selected.  These areas  were prepped and draped in the usual sterile manner.  Approximately 10 mL  of 1% lidocaine with epinephrine was injected subcutaneously.     The midline incision was made with a 10-blade scalpel.  Bovie  electrocautery was used to dissect down to the fascia down the fascia out  the lamina bilaterally.  Weitlaner retractor was placed.  The Leksell was  used to remove the spinous process.  After fluoroscopy, a verified the  correct level.  A curved curette was then used to free the ligament  from  the inner table of bone and the laminotomy was completed with the Kerrison.   The St. Jude Penta electrode was placed under direct visualization.  An  x-ray was taken and verified midline position.  The patient was tested and  had good coverage; however, the patient was not getting coverage as high on  the back as necessary. The position of the electrode was repositioned and  to get better back coverage.  The patient also reported more right-sided  coverage than left; however, the patient has more right-sided pain.  The  electrode was anchored in with anchoring with anchors and 3-0 silk sutures.   A skin incision over the pocket was made with a 10-blade scalpel.  Bovie  electrocautery and blunt dissection were used to create a pocket.  The  electrode wires were tunneled down to the pocket using the tunneler.  These  electrodes were then attached to the generator and excess electrode was  coiled up behind the generator.  The generator was placed in the pocket.   The generator was tested and all contacts were good.  The generator was  sutured in place with silk sutures.  Both areas were irrigated with  antibiotic irrigation.  The midline incision, the fascia was closed with  interrupted 2-0 Vicryl sutures.  Skin was closed with buried interrupted  3-0 Vicryl sutures and running subcuticular 4-0 Monocryl.  The pocket for  the generator was closed with interrupted 2-0 Vicryl sutures, buried  interrupted 3-0 Vicryl sutures and running subcuticular 4-0 Monocryl.   Following the procedure, both incisions were dressed with Steri-Strips and  sterile dressing.  The patient was brought off sedation.  He was taken to  recovery room in stable condition.  There were no complications.  All  sponge and needle counts were correct at the end of the procedure.     No resident physician was available to assist with this surgery.  Aura Camps, [facility] was surgical first assistant, assisting with positioning,  exposure, placement  of electrode and generator, and closure.           D:  05/07/2014 11:24 AM by Dr. Sherre Lain, MD ([id number])  T:  05/07/2014 18:39 PM by KP      (Everlean Cherry: [id number]) (Doc ID: [id number])

## 2014-05-07 NOTE — Transfer of Care (Signed)
Anesthesia Transfer of Care Note    Patient: Kent Jordan    Procedures performed: Procedure(s) with comments:  DORSAL COLUMN STIMULATOR PLACEMENT - DCS PLACEMENT    Anesthesia type: General TIVA    Patient location:Phase I PACU    Last vitals:   Filed Vitals:    05/07/14 1117   BP: 124/73   Pulse: 96   Temp: 37 C (98.6 F)   Resp: 18   SpO2: 94%       Post pain: Patient not complaining of pain, continue current therapy      Mental Status:awake and alert     Respiratory Function: tolerating room air    Cardiovascular: stable    Nausea/Vomiting: patient not complaining of nausea or vomiting    Hydration Status: adequate    Post assessment: no apparent anesthetic complications, no reportable events and no evidence of recall

## 2014-05-07 NOTE — Anesthesia Postprocedure Evaluation (Signed)
Anesthesia Post Evaluation    Patient: Kent Jordan    Procedures performed: Procedure(s) with comments:  DORSAL COLUMN STIMULATOR PLACEMENT - DCS PLACEMENT    Anesthesia type: General TIVA    Patient location:Phase II PACU    Last vitals:   Filed Vitals:    05/07/14 1300   BP: 167/83   Pulse: 76   Temp: 36.6 C (97.8 F)   Resp: 16   SpO2: 98%       Post pain: Patient not complaining of pain, continue current therapy      Mental Status:awake and alert     Respiratory Function: tolerating room air    Cardiovascular: stable    Nausea/Vomiting: patient not complaining of nausea or vomiting    Hydration Status: adequate    Post assessment: no apparent anesthetic complications

## 2014-05-10 ENCOUNTER — Encounter: Payer: Self-pay | Admitting: Neurological Surgery

## 2014-05-17 NOTE — Telephone Encounter (Signed)
I spoke with the patient 05/05/14 to confirm that he had stopped taking aspirin 81 mg in preparation for his elective placement of dorsal column stimulator scheduled for 8/14. He confirmed that he discontinued his aspirin following his appointment with presurgical services on 8/6. The patient denied any further questions or concerns.    Sherene Sires, PA-C  for Tera Helper, MD

## 2014-05-26 ENCOUNTER — Inpatient Hospital Stay (INDEPENDENT_AMBULATORY_CARE_PROVIDER_SITE_OTHER): Payer: BLUE CROSS/BLUE SHIELD | Admitting: Physician Assistant

## 2014-05-26 ENCOUNTER — Inpatient Hospital Stay (INDEPENDENT_AMBULATORY_CARE_PROVIDER_SITE_OTHER): Payer: BLUE CROSS/BLUE SHIELD | Admitting: Neurological Surgery

## 2014-06-01 ENCOUNTER — Ambulatory Visit (INDEPENDENT_AMBULATORY_CARE_PROVIDER_SITE_OTHER): Payer: BLUE CROSS/BLUE SHIELD

## 2014-06-01 ENCOUNTER — Encounter (INDEPENDENT_AMBULATORY_CARE_PROVIDER_SITE_OTHER): Payer: Self-pay

## 2014-06-01 VITALS — BP 127/85 | HR 84 | Temp 97.7°F | Resp 17 | Wt 213.0 lb

## 2014-06-01 DIAGNOSIS — M545 Low back pain, unspecified: Secondary | ICD-10-CM

## 2014-06-01 NOTE — Progress Notes (Signed)
Pt is here for nurse visit wound check for dorsal column stimulator placement performed on 05/07/14 by Dr. Genia Plants. The pt reports his stimulator is working and he feels no discomfort. He is still getting over the effects of the anesthesia. The posterior incision sites were without dressing or steri strips and has healed appropriately. He has a f/u appointment with Dr. Genia Plants scheduled for 06/16/14. All questions answered.

## 2014-06-07 ENCOUNTER — Telehealth: Payer: Self-pay

## 2014-06-16 ENCOUNTER — Ambulatory Visit (INDEPENDENT_AMBULATORY_CARE_PROVIDER_SITE_OTHER): Payer: BLUE CROSS/BLUE SHIELD | Admitting: Neurological Surgery

## 2014-06-30 ENCOUNTER — Encounter (INDEPENDENT_AMBULATORY_CARE_PROVIDER_SITE_OTHER): Payer: Self-pay | Admitting: Neurological Surgery

## 2014-06-30 ENCOUNTER — Ambulatory Visit (INDEPENDENT_AMBULATORY_CARE_PROVIDER_SITE_OTHER): Payer: BLUE CROSS/BLUE SHIELD | Admitting: Neurological Surgery

## 2014-06-30 VITALS — BP 133/84 | HR 78 | Temp 97.5°F | Resp 16 | Wt 214.0 lb

## 2014-06-30 DIAGNOSIS — M5441 Lumbago with sciatica, right side: Secondary | ICD-10-CM

## 2014-06-30 DIAGNOSIS — M5442 Lumbago with sciatica, left side: Secondary | ICD-10-CM

## 2014-06-30 NOTE — Progress Notes (Signed)
[facility] Neurosurgery  Postop Note    HPI   63 yo male with h/o lumbar fusion, chronic low back and bilateral radicular pain for the past 4 years with successful trial of St. Jude spinal cord stimulator covering all territories of his pain at least 60%. He returns for second post-op visit s/p placement of St. Jude dorsal column stimulator 05/07/14.  He reports that his incisions have healed well, the incisional pain is resolved and the stimulator covers his pain well.    Physical Examination   VITAL SIGNS:   weight is 97.07 kg (214 lb). His oral temperature is 97.5 F (36.4 C). His blood pressure is 133/84 and his pulse is 78. His respiration is 16.        General:  Well developed, well nourished male, no apparent distress  Neck:  Supple, grossly normal ROM  HEENT:  Head normocephalic, atraumatic, no obvious lesions in ear, nose or throat  Chest:  Equal chest rise.  No wheezes, rales or rhonchi.  Skin:  Pink, warm, dry.  No obvious rash.  Extremities:  Without clubbing or cyanosis    Neurologic Exam  Awake and alert  Fund of knowledge appropriate  Normal attention span, concentration, and language  Speech fluent  Cranial nerves II-XII grossly intact  No pronator drift  Normal muscle bulk and tone  Motor: Strength 4+/5 RLE, otherwise 5/5 BUE/BLE  Sensory: Sensation decreased left foot, otherwise intact to light touch and equal BUE/BLE  Coordination: Normal, no spontaneous abnormal movement, tremor, dysmetria, or ataxia  Gait: Normal  Reflexes:                        R      L              Brachioradialis   2+     2+              Biceps                2+     2+              Patellar               2+     2+              Achilles              2+     2+          Hoffman: absent bilaterally  Ankle clonus: absent bilaterally    Wound Check: Incisions clean, dry and intact, healed well with no signs of infection.    Radiology Interpretation   No results found.    Impression   63 yo male with h/o lumbar fusion,  chronic low back and bilateral radicular pain s/p placement of St. Jude dorsal column stimulator 05/07/14.  Recovered well from surgery.    Plan   Physical therapy for any residual back stiffness and to work on core strengthening exercises.  Follow-up with pain specialist for programming.  Orders Placed This Encounter   Procedures   . Ambulatory referral to Physical Therapy     Referral Priority:  Routine     Referral Type:  Physical Medicine     Referral Reason:  Specialty Services Required     Requested Specialty:  Physical Therapy     Number of Visits Requested:  1     Follow-up   Return  to clinic only if new problems arise.    Tera Helper, MD

## 2014-07-05 ENCOUNTER — Inpatient Hospital Stay
Payer: BLUE CROSS/BLUE SHIELD | Attending: Neurological Surgery | Admitting: Rehabilitative and Restorative Service Providers"

## 2014-07-05 DIAGNOSIS — M5441 Lumbago with sciatica, right side: Secondary | ICD-10-CM | POA: Insufficient documentation
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
## 2014-07-07 ENCOUNTER — Inpatient Hospital Stay
Payer: BLUE CROSS/BLUE SHIELD | Attending: Neurological Surgery | Admitting: Rehabilitative and Restorative Service Providers"

## 2014-07-07 DIAGNOSIS — M5441 Lumbago with sciatica, right side: Secondary | ICD-10-CM | POA: Insufficient documentation

## 2014-07-07 NOTE — PT/OT Therapy Note (Signed)
DAILY NOTE   07/07/2014     Time In/Out: 8:32 - 9:15 Total Time: 43 minutes Visit Number: 2    # of Authorized Visits: 12 Visit #: 2    Diagnosis (Treating/Medical):     ICD-10-CM    1. Bilateral low back pain with right-sided sciatica M54.41        Subjective:  Jamario reports that he is feeling "much tighter than I was on Monday." Reports that the combination of moving more and the exercises has made R lower back and into R glutes very sore. I've been trying to make a conscious effort to walk straighter.    Objective:   Treatment:  Therapeutic Exercise:  RCB x 6' with small hills and L4 resistance with subjective recorded  Modifications/Patient Education: educated patient on ways to perform bridges with glutes  Bridges x 15 reps with traction applied at distal quads    NMR:   R Pelvic AE with end range holds and COI to increase core initiation to improve pelvic girdle stability  R Pelvic AE with end range holds and COI with LE flex/ adduction/ IR/ DF/ Inv to improve neuromuscular firing to improve stepping ability  R Pelvic posterior depression with end range holds to increase weight bearing into R LE to improve weight acceptance    Combination of approximation and traction at B LE's in hooklying to engage core with prolonged holds - increased time spent to improve core activity    Manual Therapy:   ST mobs iliacus and psoas R LE with FM's hip flexion - increased time spent to improve hip mobility  Scar mobs throughout B lateral aspects of spinous processes along incisions      Current Measurements (ROM, Strength, Girth, Outcomes, etc.):     Gait: decreased R pelvic AE with hard landing on R LE and increased R arm swing (to assist in clearing R foot)    Modalities: None     Assessment (response to treatment):   Patient with increased pain when moving R pelvis into pelvic depression with reported neural tension from R l/s to R glutes. Patient, overall, with good ability to activate core however very limited strength  and endurance and patient challenged with approximation through B LE's in hooklying as well as R pelvic AE holds. Significant restrictions noted R hip flexors that were addressed during session.    Progress towards functional goals: see below    Goals:  Date  (Body Area, Impairment Goal, Functional   Activity, Target Performance)  Time Frame  Status  Date/  Initial    07/05/2014  Patient will improve seated VCT to >/= 4/5 in order to improve ability to sit > 40 minutes to be able to work at desk.  8 weeks  Initial Eval     07/05/2014  Patient will improve LPM to >/= 4/5 in order to improve standing tolerance to > 30 minutes to perform ADL's.  8 weeks  In progress - engaged core during session 07/07/14 [facility]   07/05/2014  Patient will improve strength via MMT by >/= 1/2 grade in order to improve ambulation tolerance to > 40 minutes to traverse grocery store.  8 weeks  Initial Eval     07/05/2014  Patient will be independent with HEP in order to maintain gains made in PT and promote independent patient management of condition and symptoms.  8 weeks  In progress - adherent currently  07/07/14 [facility]   07/05/2014  Patient will improve Oswestry to <  25% in order to show objective improvement in the function of l/s.           Patient requires continued skilled care to: improve innominate and hip mobility as well improve core stability and LE strengthening to allow patient to increase ambulation tolerance and return to [facility]    Plan:  Continue with Plan of Care    Will Solana, [place] #[id number]  07/07/2014

## 2014-07-12 ENCOUNTER — Inpatient Hospital Stay: Payer: BLUE CROSS/BLUE SHIELD | Attending: Neurological Surgery | Admitting: Physical Therapist

## 2014-07-12 DIAGNOSIS — M5441 Lumbago with sciatica, right side: Secondary | ICD-10-CM | POA: Insufficient documentation

## 2014-07-12 NOTE — PT/OT Therapy Note (Addendum)
DAILY NOTE   07/12/2014     Time In/Out: 10:45am - 11:25 am  Total time 40 minutes  Visit # 3  # of Authorized Visits: 12 Visit #: 2    Diagnosis (Treating/Medical):     ICD-10-CM    1. Bilateral low back pain with right-sided sciatica M54.41        Subjective: I am so sore, I assume it's from the exercise. I have more numbness in my L foot, it use to be just a few toes, now all the toes are numb, and it  seems like I am standing on a golf ball.  Soreness also in the  R glut, I cranked up the stimulator.  I've realized how completely out of shape I am .  I am trying to walk straighter since I was bending so far to the left before.      Objective:   Treatment:  Therapeutic Exercise:  Stationary bike x 5 minutes level 4 during subjective review  Reviewed hamstring stretches bilaterally and bridges : onset of hamstring cramps L so instructed to perform hamstring stretch prior to bridging  L SL:  Pelvic clocks toward 6 x 20 reps with active asisst- verbal cues to stay within painfree range   Patient Education: Educated re:  sore muscles part of TE and HEP, instructed to not work through sharp, jabbing pain.  Patient in agreement.      NMR:   Supine:  End range holds L LE IR after on axis hip mobilization  Iso mid range hold R LE flex/ add/ ER with onset of spasm- verbal cues to relax upper body and focus on LE's . Tactile cues for diaphragmatic breathing  L SL:  End range holds R pelvic PD to improve R LE weight acceptance    Manual Therapy:   STMobs to R inguinal ligament and vastus lateralis tendon  AP grade 4 to femur with IR FM to improve on axis positioning of R hip.    Inferior glide of R femur grade 4 with CR to R LE ext with strap assist    Current Measurements (ROM, Strength, Girth, Outcomes, etc.):     Anterior hip impingement  R hip flexion at 90 degrees  Hard end feel inferior glide R femur    Gait assessment: R LE remains abducted    Gait: decreased R pelvic AE with hard landing on R LE and increased R arm  swing (to assist in clearing R foot)    Modalities: None     Assessment (response to treatment):  Improved lumbo-pelvic AROM within pain free range.  Decreased R hip impingement symptoms after treatment.      Progress towards functional goals: see below    Goals:  Date  (Body Area, Impairment Goal, Functional   Activity, Target Performance)  Time Frame  Status  Date/  Initial    07/05/2014  Patient will improve seated VCT to >/= 4/5 in order to improve ability to sit > 40 minutes to be able to work at desk.  8 weeks  Initial Eval     07/05/2014  Patient will improve LPM to >/= 4/5 in order to improve standing tolerance to > 30 minutes to perform ADL's.  8 weeks  In progress - engaged core during session 07/07/14 [facility]   07/05/2014  Patient will improve strength via MMT by >/= 1/2 grade in order to improve ambulation tolerance to > 40 minutes to traverse grocery store.  8 weeks  Initial  Eval     07/05/2014  Patient will be independent with HEP in order to maintain gains made in PT and promote independent patient management of condition and symptoms.  8 weeks  In progress - adherent currently  07/07/14 [facility]   07/05/2014  Patient will improve Oswestry to < 25% in order to show objective improvement in the function of l/s.           Patient requires continued skilled care to: pulled fwd:  improve innominate and hip mobility as well improve core stability and LE strengthening to allow patient to increase ambulation tolerance and return to PLOF    Plan:  Continue with Plan of Care  Hassell Halim PT, MSPT  [phone]   07/12/2014

## 2014-07-14 ENCOUNTER — Inpatient Hospital Stay
Payer: BLUE CROSS/BLUE SHIELD | Attending: Neurological Surgery | Admitting: Rehabilitative and Restorative Service Providers"

## 2014-07-14 DIAGNOSIS — M5441 Lumbago with sciatica, right side: Secondary | ICD-10-CM | POA: Insufficient documentation

## 2014-07-14 NOTE — PT/OT Therapy Note (Signed)
DAILY NOTE   07/14/2014     Time In/Out: 8:32 - 9:12   Total time: 40 minutes  Visit # 4    # of Authorized Visits: 12 Visit #: 4    Diagnosis (Treating/Medical):     ICD-10-CM    1. Bilateral low back pain with right-sided sciatica M54.41        Subjective: I was very sore on Monday but to be honest, I'm not too sore today so it's getting better. I turned the stimulator up on both of my legs and my lower back so I have very little feeling in my legs right now. I haven't performed many of the bridges at home, but I started doing the pelvic clocks at home as suggested by PT Gonzaga. Patient reports that he is better with going up/down stairs in morning, but by the mid to end of the day, it is very difficult.    Objective:   Treatment:  Therapeutic Exercise:  RCB x 6' with small hills and L4 resistance with subjective recorded  Clamshells in L sidelying x 30 reps with cues for decreased trunk rotation - added to HEP    NMR:   R pelvic AD end range holds with C-R techniques into end ranges AD - transitioned to COI AD/PE with emphasis on concentric contractions into end range AD with prolonged holds  Segmental gapping of l/s above level of fusion with R pelvic PD    Manual Therapy:   Continued to perform inf glides of femur R LE with strap to improve mobility of hip R LE  Increased time with ST mobs R hip adductors with FM's into hip abduction to improve mobility  ST bony contour clearing ASIS  ST mobs iliacus R LE  ST mobs R l/s paraspinals with scar tissue mobs    Current Measurements (ROM, Strength, Girth, Outcomes, etc.):    Anterior hip impingement  R hip flexion at 90 degrees    Gait assessment: R LE remains abducted  Gait: decreased R pelvic AE with hard landing on R LE and increased R arm swing (to assist in clearing R foot)    Modalities: None     Assessment (response to treatment):   Patient with improved gait mechanics with decreased pelvic shear since last seen by this PT. Patient with significantly  decreased pain going into R pelvic PD during session compared to previous sessions and continues to be challenged with pelvic patterns both AE/PD and AD/PE R pelvis.    Progress towards functional goals: see below    Goals:  Date  (Body Area, Impairment Goal, Functional   Activity, Target Performance)  Time Frame  Status  Date/  Initial    07/05/2014  Patient will improve seated VCT to >/= 4/5 in order to improve ability to sit > 40 minutes to be able to work at desk.  8 weeks  Initial Eval     07/05/2014  Patient will improve LPM to >/= 4/5 in order to improve standing tolerance to > 30 minutes to perform ADL's.  8 weeks  In progress - engaged core during session 07/07/14 [facility]   07/05/2014  Patient will improve strength via MMT by >/= 1/2 grade in order to improve ambulation tolerance to > 40 minutes to traverse grocery store.  8 weeks  In progress - ambulation with improved mechanics 07/13/14 [facility]   07/05/2014  Patient will be independent with HEP in order to maintain gains made in PT and promote independent patient  management of condition and symptoms.  8 weeks  In progress - added clamshells 07/13/14 [facility]   07/05/2014  Patient will improve Oswestry to < 25% in order to show objective improvement in the function of l/s.         Patient requires continued skilled care to: continue to improve innominate and hip mobility as well improve core stability and LE strengthening to allow patient to increase ambulation tolerance and return to PLOF    Plan:  Continue with Plan of Care   Pelvic patterns, core    Chipper Oman, DPT [phone]  07/14/2014

## 2014-07-19 ENCOUNTER — Inpatient Hospital Stay: Payer: BLUE CROSS/BLUE SHIELD | Admitting: Physical Therapist

## 2014-07-21 ENCOUNTER — Inpatient Hospital Stay: Payer: BLUE CROSS/BLUE SHIELD

## 2014-07-21 ENCOUNTER — Inpatient Hospital Stay: Payer: BLUE CROSS/BLUE SHIELD | Attending: Neurological Surgery | Admitting: Physical Therapist

## 2014-07-21 DIAGNOSIS — M5441 Lumbago with sciatica, right side: Secondary | ICD-10-CM | POA: Insufficient documentation

## 2014-07-21 NOTE — PT/OT Therapy Note (Signed)
DAILY NOTE   07/21/2014     Time In/Out: 1:47pm -  2:30pm  Total time: 43 minutes  Visit # 5    # of Authorized Visits: 12 Visit #: 5    Diagnosis (Treating/Medical):     ICD-10-CM    1. Bilateral low back pain with right-sided sciatica M54.41        Subjective: I think I have been overdoing it.  Walking for 20 minutes twice a day.  Sitting causes R glut pain and radicular sx.  I am improving with standing up straight, and walking straight.  My legs feel loose and I can pull my foot upwards while knee straght     Objective:   Treatment:  Therapeutic Exercise: to improve AROM and strength.  RCB x 6' with small hills and L4 resistance with subjective recorded  Bridge with hip abd 15 reps    NMR:   Supine:  Rhythmic stabilization in bridge position   L SL:  R pelvic PE to eccentrically lengthen R QL then end range holds L pelvic AD the COI to improve heel strike  R pelvic AD end range holds with C-R techniques into end ranges AD - transitioned to COI AD/PE with emphasis on concentric contractions into end range AD with prolonged holds    Manual Therapy:   L SL:  Bony clearing R post GT, R iliac crest  STMobs to R QL, R glut  PA to R femur grade 4 with CR towards R LE ER  CR stretch to R quads towards R knee extension with STMobs to improve R hip extension       Current Measurements (ROM, Strength, Girth, Outcomes, etc.):  Hard endfeel R PA to R inominate   Prone R knee flexion 85 degrees    Oswestry 46 (versus 52% at IE)      Modalities: None     Assessment (response to treatment):  Patient with improved R hip extension during gait, continues with R quad restrictions that continue to limit R pelvic PD and stride length.  Progressing with function per Oswestry.      Progress towards functional goals: see below    Goals:  Date  (Body Area, Impairment Goal, Functional   Activity, Target Performance)  Time Frame  Status  Date/  Initial    07/05/2014  Patient will improve seated VCT to >/= 4/5 in order to improve ability to  sit > 40 minutes to be able to work at desk.  8 weeks  Initial Eval     07/05/2014  Patient will improve LPM to >/= 4/5 in order to improve standing tolerance to > 30 minutes to perform ADL's.  8 weeks  In progress - engaged core during session 07/07/14 [facility]   07/05/2014  Patient will improve strength via MMT by >/= 1/2 grade in order to improve ambulation tolerance to > 40 minutes to traverse grocery store.  8 weeks  In progress - ambulation with improved mechanics 07/13/14 [facility]   07/05/2014  Patient will be independent with HEP in order to maintain gains made in PT and promote independent patient management of condition and symptoms.  8 weeks  In progress - added clamshells 07/13/14 [facility]   07/05/2014  Patient will improve Oswestry to < 25% in order to show objective improvement in the function of l/s.   Progressing:  46% vs 52%  07/21/14 MG     Patient requires continued skilled care to: Progress bilateral inominate mobility, pelvic patterns to  improve gait mechanics, soft tissue management of LE's, and sit/ stand posture to improve function    Plan:  Continue with Plan of Care   Inominate mobility to improve inferior glide/ abd  Progress R hip on axis positioning    Hassell Halim PT, MSPT  [phone]   07/21/2014

## 2014-07-26 ENCOUNTER — Inpatient Hospital Stay: Payer: BLUE CROSS/BLUE SHIELD

## 2014-07-28 ENCOUNTER — Inpatient Hospital Stay: Payer: BLUE CROSS/BLUE SHIELD | Attending: Neurological Surgery | Admitting: Physical Therapist

## 2014-07-28 ENCOUNTER — Inpatient Hospital Stay: Payer: BLUE CROSS/BLUE SHIELD

## 2014-07-28 DIAGNOSIS — M5441 Lumbago with sciatica, right side: Secondary | ICD-10-CM | POA: Insufficient documentation

## 2014-07-28 NOTE — PT/OT Therapy Note (Signed)
DAILY NOTE   07/28/2014     Time In/Out: 10:00am - 10:45am  Total time: 45 minutes  Visit # 6    # of Authorized Visits: 12 Visit #: 5    Diagnosis (Treating/Medical):     ICD-10-CM    1. Bilateral low back pain with right-sided sciatica M54.41        Subjective: I'm having a psoriasis flare.  Yesterday in [place] for a conference and lots of walking and sitting.  Afterwards I was really in pain.  I was surprised when I was able to move the next morning though so things are getting better!  Currently  R front groing an dR lateral glut      Objective:   Treatment:  Therapeutic Exercise: to improve AROM and strength.  Unilateral resisted march bilaterally x 2 reps 30 sec hold.  Copy given and added to HEP  Pelvic clocks: all diagonals x 10 reps each active assist:  Verbal cues to avoid painful ranges    NMR:   Supine:  R hip flexion/ add/ ER iso holds with stability ball- reports increase anterior hip pain after 3 reps x 30 sec hold so discontinued  Segmental LTR L1-L2 with quick stretches to initiate movement    Manual Therapy:   Supine:  Superior glide of R cecum with R heel slides and R LTR   AP grade 4 R femur with R LE IR   R inominate abd with R LE ER, CR towards adduction in hooklying position  R inominate flexion mob with CR with sacral block to improve R inominate mobility        Current Measurements (ROM, Strength, Girth, Outcomes, etc.):    R femur with anterior translation with R LE IR  Hard endfeel superior glide of cecum     Oswestry 46 (versus 52% at IE)      Modalities: None     Assessment (response to treatment):  Progressing with lumbar mobility and NM control.  Demonstrates great difficulty with segmental LTR, would benefit from continued NMR to facilitate multifidi to stabilize lumbar spine.      Progress towards functional goals: progressing with lumbo-pelvic mobility and strength to facilitate efficient lifting and walking mechanics  Goals:  Date  (Body Area, Impairment Goal, Functional    Activity, Target Performance)  Time Frame  Status  Date/  Initial    07/05/2014  Patient will improve seated VCT to >/= 4/5 in order to improve ability to sit > 40 minutes to be able to work at desk.  8 weeks  Initial Eval     07/05/2014  Patient will improve LPM to >/= 4/5 in order to improve standing tolerance to > 30 minutes to perform ADL's.  8 weeks  In progress - engaged core during session 07/07/14 [facility]   07/05/2014  Patient will improve strength via MMT by >/= 1/2 grade in order to improve ambulation tolerance to > 40 minutes to traverse grocery store.  8 weeks  In progress - ambulation with improved mechanics 07/13/14 [facility]   07/05/2014  Patient will be independent with HEP in order to maintain gains made in PT and promote independent patient management of condition and symptoms.  8 weeks  In progress - added clamshells 07/13/14 [facility]   07/05/2014  Patient will improve Oswestry to < 25% in order to show objective improvement in the function of l/s.   Progressing:  46% vs 52%  07/21/14 MG     Patient requires continued  skilled care to: improve lumbopelvic mobility, stability, NM control to improve function  Plan:  Continue with Plan of Care   Continued to address ST and inominate restrictions anterior hip  Inominate mobility to improve inferior glide/ abd  Progress R hip on axis positioning anteriorly and posteriorly  Initiate squat training    Hassell Halim PT, MSPT  [phone]   07/28/2014

## 2014-08-06 ENCOUNTER — Inpatient Hospital Stay: Payer: BLUE CROSS/BLUE SHIELD | Admitting: Rehabilitative and Restorative Service Providers"

## 2014-08-11 ENCOUNTER — Inpatient Hospital Stay: Payer: BLUE CROSS/BLUE SHIELD | Attending: Neurological Surgery | Admitting: Physical Therapist

## 2014-08-11 DIAGNOSIS — M5441 Lumbago with sciatica, right side: Secondary | ICD-10-CM | POA: Insufficient documentation

## 2014-08-11 NOTE — PT/OT Therapy Note (Signed)
DAILY NOTE   08/11/2014     Time In/Out: 12:15pm - 12:59pm  Total time: 44 minutes  Visit # 7    # of Authorized Visits: 12 Visit #: 7    Diagnosis (Treating/Medical):     ICD-10-CM    1. Bilateral low back pain with right-sided sciatica M54.41        Subjective: I was feeling under the weather earlier this week so I called to cancel my appt.  I am feeling pretty good today, I feel tightness in R glut region especially while walking, but other than that I am moving around much better.       Objective:   Treatment:  Therapeutic Exercise: to improve AROM and strength.  Assisted warmup on ET x 5 minutes during subjective review.  CR to improve R LE IR x 10 reps   R SKTC x 15 reps to improve R hip/ inominate mobility   Bracing level 3: verbal and tactile cues for self tactile cues at ASIS. Copy given and added to HEP  Hip hinge squat with dowel rod to facilitate efficient alighnment.  Copy given and added to HEP   Patient education:  Instructed to add ET x 5 minutes, 2x day, then increase time by one minute each day    NMR:   Prone:  End range holds R LE IR to re-ed in new range   L SL:  End range holds R hip ext     Manual Therapy:   L SL:   R inominate extension mob with CR towards R LE flex   Supine:  R inominate flexion mob with sacral block to improve R inominate mobility         Current Measurements (ROM, Strength, Girth, Outcomes, etc.):    R LE swing test:   Hard end feel R inominate extension with poor range  Gait assessment:  Poor pelvic PD and extension end range     Modalities: None     Assessment (response to treatment):  Improved step length into extension after treatment today, would benefit from pelvic PD to improve weight acceptance, and continued work to improve R inominate extension.    Demonstrates efficient hip hinge technique with use of dowel rod to facilitate efficient alignment.      Progress towards functional goals: progressing with lumbo-pelvic mobility and strength to facilitate efficient  lifting and walking mechanics  Goals:  Date  (Body Area, Impairment Goal, Functional   Activity, Target Performance)  Time Frame  Status  Date/  Initial    07/05/2014  Patient will improve seated VCT to >/= 4/5 in order to improve ability to sit > 40 minutes to be able to work at desk.  8 weeks  Initial Eval     07/05/2014  Patient will improve LPM to >/= 4/5 in order to improve standing tolerance to > 30 minutes to perform ADL's.  8 weeks  In progress - engaged core during session 07/07/14 [facility]   07/05/2014  Patient will improve strength via MMT by >/= 1/2 grade in order to improve ambulation tolerance to > 40 minutes to traverse grocery store.  8 weeks  In progress - ambulation with improved mechanics 07/13/14 [facility]   07/05/2014  Patient will be independent with HEP in order to maintain gains made in PT and promote independent patient management of condition and symptoms.  8 weeks  In progress - added clamshells 07/13/14 [facility]   07/05/2014  Patient will improve Oswestry to <  25% in order to show objective improvement in the function of l/s.   Progressing:  46% vs 52%  07/21/14 MG     Patient requires continued skilled care to: improve lumbopelvic mobility, stability, NM control to improve function  Plan:  Continue with Plan of Care   Oswestry  Progress inominate extension   Continued to address ST and inominate restrictions anterior hip  Progress squat/ lift training    Hassell Halim PT, MSPT  [phone]   08/11/2014

## 2014-08-17 ENCOUNTER — Inpatient Hospital Stay: Payer: BLUE CROSS/BLUE SHIELD | Attending: Neurological Surgery | Admitting: Physical Therapist

## 2014-08-17 DIAGNOSIS — R29898 Other symptoms and signs involving the musculoskeletal system: Secondary | ICD-10-CM

## 2014-08-17 DIAGNOSIS — M5441 Lumbago with sciatica, right side: Secondary | ICD-10-CM | POA: Insufficient documentation

## 2014-08-17 NOTE — PT/OT Therapy Note (Signed)
DAILY NOTE   08/17/2014     Time In/Out: 10:00am - 10:40am  Total time: 40  minutes  Visit # 8/12 per Rx    # of Authorized Visits: 12 Visit #: 8    Diagnosis (Treating/Medical):     ICD-10-CM    1. Bilateral low back pain with right-sided sciatica M54.41        Subjective: 2 nights ago I fell while turning to L, and my L leg gave out.  I am currently up to 10 minutes on the elliptical at home.  When I sleep on L side my hip starts to hurt, On right side my shoulder hurts .  Billed as TE    Objective:   Treatment:    Therapeutic Exercise: to improve AROM and strength.  Copy of TE given and added to HEP.  Assisted warmup on RCB level 1  x 5 minutes during subjective review.  Unilateral bridging 30 sec hold x 2 reps bilaterally  Wall lunge x 30 sec hold x 2 reps bilaterally.     NMR:   Supine:  RS in bridge position to engage core    Manual Therapy:   Prone:  Skin and fascial clearing sacrum, bilateral bony clearing iliac crests  Inferior glide R inominate with R LTR  Inferior glide L inominate with L LTR   Supine:  Long axis distraction to improve inominate inferior glide first R then L         Current Measurements (ROM, Strength, Girth, Outcomes, etc.): Billed as TE    Sharp pain with R LE weight acceptance with weight shift  Hard end feel inferior glide L inominate with LTR   Oswestry: 40% (versus 52%)       Pulled fwd for future reference:  R LE swing test:   Hard end feel R inominate extension with poor range  Gait assessment:  Poor pelvic PD and extension end range     Modalities: None.  Patient will ice at home.       Assessment (response to treatment):  Improved weight acceptance R and L LE after NMR today.  Focus today on core recruitment and LE PD to decrease risk for knee buckling.  Continues with significant irritability central sacral region, instructed to ice to decrease inflammation.  Improved function per Oswestry.      Progress towards functional goals: improved weight acceptance bilateral LE's to  improve gait mechanics    Goals:  Date  (Body Area, Impairment Goal, Functional   Activity, Target Performance)  Time Frame  Status  Date/  Initial    07/05/2014  Patient will improve seated VCT to >/= 4/5 in order to improve ability to sit > 40 minutes to be able to work at desk.  8 weeks  Initial Eval     07/05/2014  Patient will improve LPM to >/= 4/5 in order to improve standing tolerance to > 30 minutes to perform ADL's.  8 weeks  In progress - engaged core during session 07/07/14 [facility]   07/05/2014  Patient will improve strength via MMT by >/= 1/2 grade in order to improve ambulation tolerance to > 40 minutes to traverse grocery store.  8 weeks  In progress - ambulation with improved mechanics 07/13/14 [facility]   07/05/2014  Patient will be independent with HEP in order to maintain gains made in PT and promote independent patient management of condition and symptoms.  8 weeks  In progress added wall lunge and unilat brace to improve  core and NM control LEs 08/17/14 [facility]   07/05/2014  Patient will improve Oswestry to < 25% in order to show objective improvement in the function of l/s.   Progressing:  40% vs 52%  08/17/14 MG     Patient requires continued skilled care to: pulled fwd:  improve lumbopelvic mobility, stability, NM control to improve function  Plan:  Continue with Plan of Care   Progress inominate mobility/ stability bilat  Progress inominate extension   Continued to address ST and inominate restrictions anterior hip  Progress squat/ lift training    Hassell Halim PT, MSPT  [phone]   08/17/2014

## 2014-08-23 ENCOUNTER — Inpatient Hospital Stay
Payer: BLUE CROSS/BLUE SHIELD | Attending: Neurological Surgery | Admitting: Rehabilitative and Restorative Service Providers"

## 2014-08-23 DIAGNOSIS — M5441 Lumbago with sciatica, right side: Secondary | ICD-10-CM | POA: Insufficient documentation

## 2014-08-23 NOTE — PT/OT Therapy Note (Signed)
DAILY NOTE   08/23/2014     Time In/Out: 2:00 - 2:43  Total time:  43 minutes  Visit # 9/12 per Rx    # of Authorized Visits: 12 Visit #: 9    Diagnosis (Treating/Medical):     ICD-10-CM    1. Bilateral low back pain with right-sided sciatica M54.41        Subjective:   Went to pain management MD and he wants me to get x-rays for upper and middle back. Now that we've "solved the problem" with my lower back. Lowered dose of Nucynta from 150 mg 2x/day to 100 mg 2x/day. It's an opioid and they want me to wean off of it.    I think it must be the weather - but every bone in my body is aching today.     Overall, I don't have nearly as much pain as I used to. I can't get past the weakness - I go twice around the block and I'm just worn out. I haven't been able to get on the ET for 10 minutes like I told PT Gonzaga I would because of pain.    My L LE gave out on me while getting out of a chair to where I fell - which is odd because all my pain is typically R sided. I have been getting L knee pain with going up stairs but it feels different than the R LE pain.    Objective:   Treatment:  Therapeutic Exercise: to improve AROM and strength.    RCB x 5' to end session with large hills and L6 resistance and subjective recorded  Patient education: discussed progress patient has made and addressed concerns regarding POC - overall, patient very pleased with progress    NMR:   Combination of traction and approximation through B LE's with patient in hooklying and resisting in a variety of diagonals to engage/activate core - prolonged holds in each direction  Using red theraball to support legs in hip and knee flexion with active ankle DF, performed iso hip flexion with focus on traction to engage core - prolonged holds and performed for several reps - transitioned to patient performing resistance at distal quads and PT adding traction at feet    Manual Therapy:  Increased time with scar mobs with patient in prone with gentle  superficial fascia stretching using assisting hand and gradually increasing pressure as tolerated  Hip extension PA mobs prone with C-R techniques into end range hip extension  Bony contour clearing superior border sacrum  Inf mobs L innominate with FM's knee flex/extension        Current Measurements (ROM, Strength, Girth, Outcomes, etc.): Billed as TE    (pulled forward for reference):  Sharp pain with R LE weight acceptance with weight shift  Hard end feel inferior glide L inominate with LTR   Oswestry: 40% (versus 52%)       Pulled fwd for future reference:  R LE swing test:   Hard end feel R inominate extension with poor range  Gait assessment:  Poor pelvic PD and extension end range     Modalities: None - ice at home as needed     Assessment (response to treatment):    Patient with improved hip and innominate extension following today's session - patient with scar tissue that was contributing to patient's difficulty with hip extension. With decreased hip and innominate extension, patient unable to efficiently accept weight onto L LE, which is why PT  chose to focus on this area during today's session.      Progress towards functional goals: focused on improving mechanical restrictions to allow for improved gait mechanics    Goals:  Date  (Body Area, Impairment Goal, Functional   Activity, Target Performance)  Time Frame  Status  Date/  Initial    07/05/2014  Patient will improve seated VCT to >/= 4/5 in order to improve ability to sit > 40 minutes to be able to work at desk.  8 weeks  Initial Eval     07/05/2014  Patient will improve LPM to >/= 4/5 in order to improve standing tolerance to > 30 minutes to perform ADL's.  8 weeks  In progress - engaged core during session 07/07/14 [facility]   07/05/2014  Patient will improve strength via MMT by >/= 1/2 grade in order to improve ambulation tolerance to > 40 minutes to traverse grocery store.  8 weeks  In progress - ambulation with improved mechanics 07/13/14 [facility]    07/05/2014  Patient will be independent with HEP in order to maintain gains made in PT and promote independent patient management of condition and symptoms.  8 weeks  In progress added wall lunge and unilat brace to improve core and NM control LEs 08/17/14 [facility]   07/05/2014  Patient will improve Oswestry to < 25% in order to show objective improvement in the function of l/s.   Progressing:  40% vs 52%  08/17/14 MG     Patient requires continued skilled care to: continue to improve lumbopelvic mobility, stability, NM control to improve function      Plan:  Continue with Plan of Care   Scar mobs l/s prone  Still applies:  Progress inominate mobility/ stability bilat  Progress inominate extension   Continued to address ST and inominate restrictions anterior hip  Progress squat/ lift training          Chipper Oman, DPT [phone]   08/23/2014

## 2014-08-25 ENCOUNTER — Inpatient Hospital Stay
Payer: BLUE CROSS/BLUE SHIELD | Attending: Neurological Surgery | Admitting: Rehabilitative and Restorative Service Providers"

## 2014-08-25 DIAGNOSIS — M5441 Lumbago with sciatica, right side: Secondary | ICD-10-CM | POA: Insufficient documentation

## 2014-08-25 NOTE — PT/OT Therapy Note (Signed)
DAILY NOTE   08/25/2014     Time In/Out: 7:47 - 8:30  Total time: 43 minutes  Visit # 10/12 per Rx    # of Authorized Visits: 12 Visit #: 10    Diagnosis (Treating/Medical):     ICD-10-CM    1. Bilateral low back pain with right-sided sciatica M54.41        Subjective:   Went out yesterday to work and walked 4000 steps and I am so sore afterwards. I try to keep pushing through it. My hands are swollen from psoriatic arthritis and that is killing me this morning. I'm usually good with pain until 10:00 am and then after that, I'm hunched over my cane while walking. I see my rheumatologist on 09/07/14.       Objective:   Treatment:  Therapeutic Exercise: to improve AROM and strength  RCB x 5' to end session with small hills and L4 resistance  Patient education: continue to perform HEP to improve strength    NMR:   R Pelvic AE with end range holds and COI to increase core initiation to improve pelvic girdle stability  R Pelvic AE with end range holds and COI with LE flex/ adduction/ IR/ DF/ Inv to improve neuromuscular firing to improve stepping ability    Manual Therapy:  Scar mobs throughout l/s as well as L glutes where external device is placed - increased time spent along with superficial fascia clearing  Bony contour clearing l/s spinous processes L1-L5  Cleared bony contours L pelvis  25' spent to decrease pain with MT techniques    Current Measurements (ROM, Strength, Girth, Outcomes, etc.): Billed as TE    (pulled forward for reference):  Sharp pain with R LE weight acceptance with weight shift  Hard end feel inferior glide L inominate with LTR   Oswestry: 40% (versus 52%)       Pulled fwd for future reference:  R LE swing test:   Hard end feel R inominate extension with poor range  Gait assessment:  Poor pelvic PD and extension end range     Modalities: None - ice at home as needed     Assessment (response to treatment):    Patient in increased pain coming into session, which is why PT chose to focus majority  of session on ST and scar mobs to decrease pain. Once pain was reduced, focused on core.       Progress towards functional goals: session mainly consisted of pain relief    Goals:  Date  (Body Area, Impairment Goal, Functional   Activity, Target Performance)  Time Frame  Status  Date/  Initial    07/05/2014  Patient will improve seated VCT to >/= 4/5 in order to improve ability to sit > 40 minutes to be able to work at desk.  8 weeks  Initial Eval     07/05/2014  Patient will improve LPM to >/= 4/5 in order to improve standing tolerance to > 30 minutes to perform ADL's.  8 weeks  In progress - engaged core during session 07/07/14 [facility]   07/05/2014  Patient will improve strength via MMT by >/= 1/2 grade in order to improve ambulation tolerance to > 40 minutes to traverse grocery store.  8 weeks  In progress - ambulation with improved mechanics 07/13/14 [facility]   07/05/2014  Patient will be independent with HEP in order to maintain gains made in PT and promote independent patient management of condition and symptoms.  8 weeks  In progress added wall lunge and unilat brace to improve core and NM control LEs 08/17/14 [facility]   07/05/2014  Patient will improve Oswestry to < 25% in order to show objective improvement in the function of l/s.   Progressing:  40% vs 52%  08/17/14 MG     Patient requires continued skilled care to: continue to improve lumbopelvic mobility, stability, NM control to improve function    Plan:  Continue with Plan of Care   Scar mobs l/s prone  Still applies:  Progress inominate mobility/ stability bilat  Progress inominate extension   Continued to address ST and inominate restrictions anterior hip  Progress squat/ lift training      Chipper Oman, DPT [phone]   08/25/2014

## 2014-08-30 ENCOUNTER — Ambulatory Visit
Admission: RE | Admit: 2014-08-30 | Discharge: 2014-08-30 | Disposition: A | Discharge status: Home / Self Care | Payer: BLUE CROSS/BLUE SHIELD | Source: Ambulatory Visit | Attending: Physical Medicine & Rehabilitation | Admitting: Physical Medicine & Rehabilitation

## 2014-08-30 ENCOUNTER — Other Ambulatory Visit: Payer: Self-pay | Admitting: Physical Medicine & Rehabilitation

## 2014-08-30 ENCOUNTER — Inpatient Hospital Stay: Payer: BLUE CROSS/BLUE SHIELD | Attending: Neurological Surgery | Admitting: Physical Therapist

## 2014-08-30 DIAGNOSIS — M25511 Pain in right shoulder: Secondary | ICD-10-CM

## 2014-08-30 DIAGNOSIS — M25512 Pain in left shoulder: Secondary | ICD-10-CM

## 2014-08-30 DIAGNOSIS — M542 Cervicalgia: Secondary | ICD-10-CM

## 2014-08-30 DIAGNOSIS — M5441 Lumbago with sciatica, right side: Secondary | ICD-10-CM

## 2014-08-30 NOTE — PT/OT Therapy Note (Signed)
DAILY NOTE   08/30/2014     Time In/Out: 10:00am -  10:40am  Total time: 40  minutes  Visit # 11/12 per Rx    # of Authorized Visits: 12 Visit #: 10    Diagnosis (Treating/Medical):     ICD-10-CM    1. Bilateral low back pain with right-sided sciatica M54.41        Subjective: Pins and needles in my R arm this weekend.  L3-S1 fused.  Still having intermittent buckling of L knee.      Objective:   Treatment:  Therapeutic Exercise: to improve AROM, stabilization.  Copy of TE given and added to HEP.    Assisted warmup on RCB x 5'  Level 4 resistance during subjective review.    Ab series:  All 3 positions with verbal and tactile cues to facilitate traction and rectus abdominus.  Instructed to perform in am and when self check of L LE ER is weaker.        NMR:   Prone:  L knee TKE end range holds then COI with resistance at distal femur x 5, then prox tibia x 5  R SL  Attempted L pelvic AE/PD RI but with reports of L QL pulling so addressed soft tissue restriction     Manual Therapy:  Skin/ superficial fascia/ scar  mobs above and around internal device in L glut region.     Prone:  PA to femur grade 4 to improve on axis positioning L hip   R SL:  STMobs L QL: perpendicular deformation    Current Measurements (ROM, Strength, Girth, Outcomes, etc.): Billed as TE  Lumbar impact test:  Left LE:  Hip flexion  Pre: 3+, post 4-  Knee ext pre 4+  Ankle DF pre 4-, post 4  IR pre 3+  ER pre 3+ , post 4    Oswestry 34% (versus 52% at IE)       Modalities: None - ice at home as needed     Assessment (response to treatment):  Improved strength especially with L LE ER after ab series, improved mobility around internal stim device in L buttock region.  Improving function per Oswestry.  Would benefit from L pelvic/ LE PD to improve weight acceptance L LE and decrease risk for falls 2 to buckling.      Progress towards functional goals: see below    Goals:  Date  (Body Area, Impairment Goal, Functional   Activity, Target Performance)   Time Frame  Status  Date/  Initial    07/05/2014  Patient will improve seated VCT to >/= 4/5 in order to improve ability to sit > 40 minutes to be able to work at desk.  8 weeks  Progressing:  Initiated NMR to L LE  08/30/14 MG   07/05/2014  Patient will improve LPM to >/= 4/5 in order to improve standing tolerance to > 30 minutes to perform ADL's.  8 weeks  Progressed to ab series to improve core strength 08/30/14 MG   07/05/2014  Patient will improve strength via MMT by >/= 1/2 grade in order to improve ambulation tolerance to > 40 minutes to traverse grocery store.  8 weeks  Progressing:  Instructed to check for inhibition of L LE ER, and perform ab series to facilitate core and improve LE strength   08/30/14 MG   07/05/2014  Patient will be independent with HEP in order to maintain gains made in PT and promote independent patient management of  condition and symptoms.  8 weeks  In progress added wall lunge and unilat brace to improve core and NM control LEs 08/17/14 MG   07/05/2014  Patient will improve Oswestry to < 25% in order to show objective improvement in the function of l/s.   Progressing:  34% VS 52%  08/30/14 MG     Patient requires continued skilled care to: continue to improve bilateral lumbopelvic mobility, stability, NM control to improve function    Plan:  Continue with Plan of Care   Wall lunge to facilitate L pelvic PD and LE ext to decrease buckling  L inominate mobility especially inferior glide and extension  NMR: L pelvic PD to decrease L knee buckling    Still applies:  Scar mobs l/s prone  Continued to address ST and inominate restrictions anterior hip  Progress squat/ lift training      Hassell Halim PT, MSPT  [phone]    08/30/2014

## 2014-09-01 ENCOUNTER — Encounter: Payer: Self-pay | Admitting: Rehabilitative and Restorative Service Providers"

## 2014-09-01 ENCOUNTER — Inpatient Hospital Stay
Payer: BLUE CROSS/BLUE SHIELD | Attending: Neurological Surgery | Admitting: Rehabilitative and Restorative Service Providers"

## 2014-09-01 DIAGNOSIS — M5441 Lumbago with sciatica, right side: Secondary | ICD-10-CM | POA: Insufficient documentation

## 2014-09-01 NOTE — PT/OT Therapy Note (Signed)
DAILY NOTE   09/01/2014     Time In/Out: 7:45 - 8:30   Total time: 45 minutes  Visit # 12/12 per Rx    # of Authorized Visits:   Visit #:      Diagnosis (Treating/Medical):     ICD-10-CM    1. Bilateral low back pain with right-sided sciatica M54.41        Subjective:   I haven't had any knee buckling since Monday - we worked a lot of my knee last session. I had to do a lot of walking and a lot of sitting in bad chairs and no buckling. Still having pains in my shoulder and down the arm - had a bunch of x-rays on L and R shoulder and neck but don't see the MD until the 28th. Also haven't had much pain at the incision site where the device is lately after you and Shawna Orleans worked on it the last couple of sessions.    Objective:   Treatment:  Therapeutic Exercise: to improve AROM, stabilization.    RCB x 6' with L7 resistance and subjective recorded and random hills  TKE at free-motion machine and 5 lbs resistance x 2' - performed on each LE      NMR:   L Pelvic AE with end range holds and COI to increase core initiation to improve pelvic girdle stability  L Pelvic AE with end range holds and COI with LE flex/ adduction/ IR/ DF/ Inv to improve neuromuscular firing to improve stepping ability  L Pelvic posterior depression with end range holds to increase weight bearing into L LE to improve weight acceptance    Manual Therapy:  ST mobs R QL with increased time spent to decrease restrictions  Scar tissue mobs and bony contour spin processes lower l/s      Current Measurements (ROM, Strength, Girth, Outcomes, etc.):     Modalities: None - ice at home as needed     Assessment (response to treatment):   Majority of session focused on pelvic patterns to engage core and improve gait mechanics. Patient with significant improvement in core engagement, strength and endurance during today's session.    Progress towards functional goals: see below    Goals:  Date  (Body Area, Impairment Goal, Functional   Activity, Target  Performance)  Time Frame  Status  Date/  Initial    07/05/2014  Patient will improve seated VCT to >/= 4/5 in order to improve ability to sit > 40 minutes to be able to work at desk.  8 weeks  Progressing:  Initiated NMR to L LE  08/30/14 MG   07/05/2014  Patient will improve LPM to >/= 4/5 in order to improve standing tolerance to > 30 minutes to perform ADL's.  8 weeks  Progressed to ab series to improve core strength 08/30/14 MG   07/05/2014  Patient will improve strength via MMT by >/= 1/2 grade in order to improve ambulation tolerance to > 40 minutes to traverse grocery store.  8 weeks  Progressing:  Instructed to check for inhibition of L LE ER, and perform ab series to facilitate core and improve LE strength   08/30/14 MG   07/05/2014  Patient will be independent with HEP in order to maintain gains made in PT and promote independent patient management of condition and symptoms.  8 weeks  In progress added wall lunge and unilat brace to improve core and NM control LEs 08/17/14 MG   07/05/2014  Patient will  improve Oswestry to < 25% in order to show objective improvement in the function of l/s.   Progressing:  34% VS 52%  08/30/14 MG     Patient requires continued skilled care to: continue to improve bilateral lumbopelvic mobility, stability, NM control to improve function and return patient to PLOF    Plan:  Continue with Plan of Care   (continues to apply):  Wall lunge to facilitate L pelvic PD and LE ext to decrease buckling  L inominate mobility especially inferior glide and extension  NMR: L pelvic PD to decrease L knee buckling    Still applies:  Scar mobs l/s prone  Continued to address ST and inominate restrictions anterior hip  Progress squat/ lift training    Chipper Oman, DPT [phone]   09/01/2014

## 2014-09-06 ENCOUNTER — Inpatient Hospital Stay: Payer: BLUE CROSS/BLUE SHIELD | Attending: Neurological Surgery | Admitting: Physical Therapist

## 2014-09-06 DIAGNOSIS — M5441 Lumbago with sciatica, right side: Secondary | ICD-10-CM | POA: Insufficient documentation

## 2014-09-06 NOTE — PT/OT Therapy Note (Signed)
DAILY NOTE   09/06/2014     Time In/Out: 10:00am  -  10:43am   Total time: 43 minutes  Visit # 13/12 per Rx (awaiting updated Rx, PN sent to MD)     # of Authorized Visits:   Visit #:      Diagnosis (Treating/Medical):     ICD-10-CM    1. Bilateral low back pain with right-sided sciatica M54.41        Subjective: I am very tired today, I took my dog to obedience training yesterday for an hour and I was exhausted.  The massage techniques that you taught me have really helped on my L glut scar.      Objective:   Treatment:    Therapeutic Exercise: to improve AROM, strength.  Standing:  L hip to R ear PNF pattern with LE pivot no resistancex 10 reps- verbal cues for smooth movement  R hip to L ear PNF pattern with LE pivot no resistance x 10 reps    TA:  Lifting from floor (lift box) with emphasis on getting close to object, wide BOS, hip hinge squat, look at object you are lifting and [employer]-rex arms to keep load close as often as able  Tactile cues for efficient spinal alignment via dowel rod     NMR:   L SL:  R mass flexion end range holds then COI to improve core strength  R mass elongation with quick stretch to scap and pelvis to improve initiation to improve core strength    R SL:  L mass flexion   Attempted L pass elongation but with pain so discontinued    Manual Therapy:  L SL:  Bony clearing R iliac crest    Skin/ superficial fascia  sacrum         Current Measurements (ROM, Strength, Girth, Outcomes, etc.):   R SI region pain with L pelvic AE/PD     Modalities: None - ice at home as needed     Assessment (response to treatment): Progressing with NMR to integrate UE and LE's to facilitate lifting and squatting.  Patient with much improved UE/LE elongation with PNF patterns after treatment today.      Progress towards functional goals: see below    Goals:  Date  (Body Area, Impairment Goal, Functional   Activity, Target Performance)  Time Frame  Status  Date/  Initial    07/05/2014  Patient will improve seated VCT  to >/= 4/5 in order to improve ability to sit > 40 minutes to be able to work at desk.  8 weeks  Progressing:  Initiated NMR to L LE  08/30/14 MG   07/05/2014  Patient will improve LPM to >/= 4/5 in order to improve standing tolerance to > 30 minutes to perform ADL's.  8 weeks  Progressed to standing PNF pattern to improve function 09/06/14 MG   07/05/2014  Patient will improve strength via MMT by >/= 1/2 grade in order to improve ambulation tolerance to > 40 minutes to traverse grocery store.  8 weeks  Progressing:  Instructed to check for inhibition of L LE ER, and perform ab series to facilitate core and improve LE strength   08/30/14 MG   07/05/2014  Patient will be independent with HEP in order to maintain gains made in PT and promote independent patient management of condition and symptoms.  8 weeks  In progress added wall lunge and unilat brace to improve core and NM control LEs 08/17/14 MG  07/05/2014  Patient will improve Oswestry to < 25% in order to show objective improvement in the function of l/s.   Progressing:  34% VS 52%  08/30/14 MG     Patient requires continued skilled care to: pulled fwd:  continue to improve bilateral lumbopelvic mobility, stability, NM control to improve function and return patient to PLOF    Plan:  Continue with Plan of Care   Progress standing NMR  Progress mass flexion and elongation patterns  Progress lift training to incorporate turning with weight    Hassell Halim PT, MSPT  [phone]    09/06/2014

## 2014-09-08 ENCOUNTER — Inpatient Hospital Stay: Payer: BLUE CROSS/BLUE SHIELD | Attending: Neurological Surgery | Admitting: Physical Therapist

## 2014-09-08 DIAGNOSIS — M5441 Lumbago with sciatica, right side: Secondary | ICD-10-CM | POA: Insufficient documentation

## 2014-09-08 NOTE — PT/OT Therapy Note (Signed)
DAILY NOTE   09/08/2014     Time In/Out: 12:16pm  - 1:00pm   Total time: 44 minutes  Visit # 14/12 per Rx (awaiting updated Rx, PN sent to MD)     # of Authorized Visits:   Visit #:      Diagnosis (Treating/Medical):     ICD-10-CM    1. Bilateral low back pain with right-sided sciatica M54.41        Subjective:  I definitely feel it in my legs after last session which is good.  I feel so much tightness in my (R glut)     Objective:   Treatment:    Therapeutic Exercise: to improve AROM/ flexibility  Basking seal x 15 reps with verbal cues for UE/LE coordination, tactile cues for pelvic  PD   Hamstring mobs x 20 reps bilaterally     NMR:   L SL:  R pelvic PE to eccentrically lengthe R QL  R pelvic AD end range holds then COI to re-ed in new range and to improve heel strike  R pelvic AE end range holds but with R glut spasm so discontinued  R pelvic PD end range holds the COI to improve weight acceptance   Forward weight shift to R LE into rhythmic stabilization to improve weight acceptance x 8 reps     Manual Therapy:  L SL:  Bony clearing R iliac crest    STMobs R QL  Bony clearing R posterior GT  STMobs to R glut max and med with R pelvic clocks  STMobs to R glut max/ med with R basking seal FM x 10 reps       Current Measurements (ROM, Strength, Girth, Outcomes, etc.):   Gait assessment:   Decreased R pelvic AE/PD with poor weight acceptance.      Modalities: None - ice at home as needed     Assessment (response to treatment): Improved R pelvic AE/PD and weight acceptance R LE after NMR today, continues to improve dissociation between thoracic and lumbar spine , as well as improved initiation of core with ab series.  No longer reports L knee buckling, especially after performing ab series to facilitate core.      Progress towards functional goals: see above.      Goals:  Date  (Body Area, Impairment Goal, Functional   Activity, Target Performance)  Time Frame  Status  Date/  Initial    07/05/2014  Patient will  improve seated VCT to >/= 4/5 in order to improve ability to sit > 40 minutes to be able to work at desk.  8 weeks  Progressing:  Initiated NMR to L LE  08/30/14 MG   07/05/2014  Patient will improve LPM to >/= 4/5 in order to improve standing tolerance to > 30 minutes to perform ADL's.  8 weeks  Progressed to standing PNF pattern to improve function 09/06/14 MG   07/05/2014  Patient will improve strength via MMT by >/= 1/2 grade in order to improve ambulation tolerance to > 40 minutes to traverse grocery store.  8 weeks  Progressing:  Instructed to check for inhibition of L LE ER, and perform ab series to facilitate core and improve LE strength   08/30/14 MG   07/05/2014  Patient will be independent with HEP in order to maintain gains made in PT and promote independent patient management of condition and symptoms.  8 weeks  In progress added wall lunge and unilat brace to improve core and NM  control LEs 08/17/14 MG   07/05/2014  Patient will improve Oswestry to < 25% in order to show objective improvement in the function of l/s.   Progressing:  34% VS 52%  08/30/14 MG     Patient requires continued skilled care to: pulled fwd:  continue to improve bilateral lumbopelvic mobility, stability, NM control to improve function and return patient to PLOF    Plan:  Continue with Plan of Care   Progress standing NMR to improve R and L SLS   Progress mass flexion and elongation patterns  Progress lift training to incorporate turning with weight    Hassell Halim PT, MSPT  [phone]    09/08/2014

## 2014-09-13 ENCOUNTER — Inpatient Hospital Stay: Payer: BLUE CROSS/BLUE SHIELD | Attending: Neurological Surgery | Admitting: Physical Therapist

## 2014-09-13 DIAGNOSIS — M5441 Lumbago with sciatica, right side: Secondary | ICD-10-CM | POA: Insufficient documentation

## 2014-09-13 NOTE — PT/OT Therapy Note (Signed)
DAILY NOTE   09/13/2014     Time In/Out: 10:00am  - 10:43 pm   Total time: 43 minutes    Visit # 15/12 per Rx (awaiting updated Rx, PN sent to MD)     # of Authorized Visits:   Visit #:      Diagnosis (Treating/Medical):     ICD-10-CM    1. Bilateral low back pain with right-sided sciatica M54.41        Subjective: Went on a long 3 hour trip each way over weekend, it was pretty uncomfortable sitting and standing all day for the graduation but I am surprised that I was able to get through it and I wasn't completely wiped out the next day.  So I am definitely able to do more.      Objective:   Treatment:    Therapeutic Exercise: to improve AROM/ flexibility, strength  Assisted warmup on RCB cx 5 minutes during subjective review  Leg press 90 pounds x 2 minutes, then progressed to 110 pound x 1 minute     NMR:   L SL:  R pelvic AE/PD RI   R pelvic AE end range holds then COI  R pelvic PD end range holds the COI to improve weight acceptance   End range holds R hip extension to re-ed in new range       Manual Therapy:  L SL:  STMobs between interface of QL and ascending colon   Supine:  Visceral mobs with L LTR   Bony clearing R anterior iliac crest  STMobs to R iliacus with heel slide FM  Superior glide of R cecum with heel slide FM          Current Measurements (ROM, Strength, Girth, Outcomes, etc.):     Hard end feel with minimal excursion R LE swing test  Palpation:  Extreme TTP interface R iliacus and ascending colon      Modalities: None - ice at home as needed     Assessment (response to treatment): Improved excursion into extension with R LE swing post test after treatment today.  Continues with significant dysfunction at R QL/ ascending colon interface, and anterior lower R abdominal quadrant limiting R LE extension especially during ambulation.  Would benefit from continued therapy to improve gait mechanics, lumbopelvic mobility and stability to improve function.      Progress towards functional goals: improved  gait mechanics     Goals:  Date  (Body Area, Impairment Goal, Functional   Activity, Target Performance)  Time Frame  Status  Date/  Initial    07/05/2014  Patient will improve seated VCT to >/= 4/5 in order to improve ability to sit > 40 minutes to be able to work at desk.  8 weeks  Progressing:  Initiated NMR to L LE  08/30/14 MG   07/05/2014  Patient will improve LPM to >/= 4/5 in order to improve standing tolerance to > 30 minutes to perform ADL's.  8 weeks  Progressed to standing PNF pattern to improve function 09/06/14 MG   07/05/2014  Patient will improve strength via MMT by >/= 1/2 grade in order to improve ambulation tolerance to > 40 minutes to traverse grocery store.  8 weeks  Progressing:  Instructed to check for inhibition of L LE ER, and perform ab series to facilitate core and improve LE strength   08/30/14 MG   07/05/2014  Patient will be independent with HEP in order to maintain gains made in PT  and promote independent patient management of condition and symptoms.  8 weeks  In progress added wall lunge and unilat brace to improve core and NM control LEs 08/17/14 MG   07/05/2014  Patient will improve Oswestry to < 25% in order to show objective improvement in the function of l/s.   Progressing:  34% VS 52%  08/30/14 MG     Patient requires continued skilled care to: pulled fwd:  continue to improve bilateral lumbopelvic mobility, stability, NM control to improve function and return patient to PLOF    Plan:  Continue with Plan of Care   Pulled fwd:   Progress standing NMR to improve R and L SLS   Progress mass flexion and elongation patterns  Progress lift training to incorporate turning with weight    Hassell Halim PT, MSPT  [phone]    09/13/2014

## 2014-09-15 ENCOUNTER — Inpatient Hospital Stay
Payer: BLUE CROSS/BLUE SHIELD | Attending: Neurological Surgery | Admitting: Rehabilitative and Restorative Service Providers"

## 2014-09-15 DIAGNOSIS — M5441 Lumbago with sciatica, right side: Secondary | ICD-10-CM

## 2014-09-15 NOTE — PT/OT Therapy Note (Signed)
DAILY NOTE   09/15/2014     Time In/Out: 7:47 - 8:30  Total time: 43 minutes    Visit # 16/12 per Rx (awaiting updated Rx, PN sent to MD)     # of Authorized Visits:   Visit #:      Diagnosis (Treating/Medical):     ICD-10-CM    1. Bilateral low back pain with right-sided sciatica M54.41        Subjective: Couldn't believe how well I did with the graduation - I had pain but I was able to tolerate it. Melanie worked on my QL and colon and it hurt a lot while she was doing it, but this is the best I've felt in a while.    Objective:   Treatment:  Therapeutic Exercise: to improve AROM/ flexibility, strength  RCB x 6' with large hills and L8 resistance with subjective recorded  LP x 2' at 100 lbs  LP x 2' at 120 lbs  TG x 3' L9 resistance    NMR:   R Pelvic AE with end range holds and COI to increase core initiation to improve pelvic girdle stability  R Pelvic AE with end range holds and COI with LE flex/ adduction/ IR/ DF/ Inv to improve neuromuscular firing to improve stepping ability  R Pelvic posterior depression with end range holds to increase weight bearing into R LE to improve weight acceptance    Manual Therapy:  Continued to mobilize QL along interface with colon - increased time spent to improve mobility  Innominate gapping in SL R LE   ST bony contour clearing R PSIS  ST mobs R paraspinals          Current Measurements (ROM, Strength, Girth, Outcomes, etc.):   Modalities: None - ice at home as needed     Assessment (response to treatment):  Patient with much improved upright posture since last seen by this therapist. Patient able to tolerate increased TE during today's session and even performed squats on total gym today.      Progress towards functional goals: improved posture    Goals:  Date  (Body Area, Impairment Goal, Functional   Activity, Target Performance)  Time Frame  Status  Date/  Initial    07/05/2014  Patient will improve seated VCT to >/= 4/5 in order to improve ability to sit > 40 minutes to  be able to work at desk.  8 weeks  Progressing:  Initiated NMR to L LE  08/30/14 MG   07/05/2014  Patient will improve LPM to >/= 4/5 in order to improve standing tolerance to > 30 minutes to perform ADL's.  8 weeks  Progressed to standing PNF pattern to improve function 09/06/14 MG   07/05/2014  Patient will improve strength via MMT by >/= 1/2 grade in order to improve ambulation tolerance to > 40 minutes to traverse grocery store.  8 weeks  Progressing:  Instructed to check for inhibition of L LE ER, and perform ab series to facilitate core and improve LE strength   08/30/14 MG   07/05/2014  Patient will be independent with HEP in order to maintain gains made in PT and promote independent patient management of condition and symptoms.  8 weeks  In progress added wall lunge and unilat brace to improve core and NM control LEs 08/17/14 MG   07/05/2014  Patient will improve Oswestry to < 25% in order to show objective improvement in the function of l/s.   Progressing:  34%  VS 52%  08/30/14 MG     Patient requires continued skilled care to: pulled fwd:  continue to improve bilateral lumbopelvic mobility, stability, NM control to improve function and return patient to PLOF    Plan:  Continue with Plan of Care   Pulled fwd:   Progress standing NMR to improve R and L SLS   Progress mass flexion and elongation patterns  Progress lift training to incorporate turning with weight    Chipper Oman, DPT [phone]   09/15/2014

## 2014-09-20 ENCOUNTER — Inpatient Hospital Stay
Payer: BLUE CROSS/BLUE SHIELD | Attending: Neurological Surgery | Admitting: Rehabilitative and Restorative Service Providers"

## 2014-09-20 ENCOUNTER — Other Ambulatory Visit: Payer: Self-pay | Admitting: Physical Medicine & Rehabilitation

## 2014-09-20 DIAGNOSIS — M5020 Other cervical disc displacement, unspecified cervical region: Secondary | ICD-10-CM

## 2014-09-20 DIAGNOSIS — M5441 Lumbago with sciatica, right side: Secondary | ICD-10-CM | POA: Insufficient documentation

## 2014-09-20 DIAGNOSIS — M5010 Cervical disc disorder with radiculopathy, unspecified cervical region: Secondary | ICD-10-CM

## 2014-09-20 NOTE — PT/OT Therapy Note (Signed)
DAILY NOTE   09/20/2014     Time In/Out: 11:00 - 11:43  Total time: 43 minutes    Visit # 17/12 per Rx (awaiting updated Rx, PN sent to MD)     # of Authorized Visits:   Visit #:      Diagnosis (Treating/Medical):     ICD-10-CM    1. Bilateral low back pain with right-sided sciatica M54.41        Subjective:   Patient reports very sore today - on Saturday, went to a [employer] hockey game and had to do a lot of walking inside the stadium and had to go up/down the stairs a lot. Was using SPC as needed, but by end of night, was leaning heavily onto the R LE. Is less sore than he was yesterday and is doing better overall. Saw MD Cherick (pain management) for follow up today and he was concerned with pins/needles and weakness in my UE's and ordered a CT and nerve study.    Objective:   Treatment:  Therapeutic Exercise: to improve AROM/ flexibility, strength  RCB for 6' with L6 resistance and random hills with subjective taken  Hip extension x 15 reps on B LE's in prone  LP 120 lbs x 3'  gastroc slant board stretch tension on/tension off x 2'  Bear hug stretch for t/s stretch and added rotation to localize     NMR:   Sacral gapping B LE's in prone with C-R techniques into end ranges hip IR - increased time spent to improve mobility and re-train jt mechanics  On-axis hip mobilizations into hip ER with PA pressure at prox femur and C-R techniques into end range hip ER - increased time spent to improve mobility and re-train jt mechanics    Manual Therapy:  Continued to mobilize paraspinals for pain relief  Bony contour clearing superior sacrum  Bony contour clearing PSIS B    Current Measurements (ROM, Strength, Girth, Outcomes, etc.):   Modalities: None - ice at home as needed     Assessment (response to treatment):  Patient with increased soreness/pain during session most likely due to increased walking and navigating of stairs on Saturday. Overall, patient did well tolerating TE, however left session sore and with noted  weakness from fatigue.    Progress towards functional goals: see assessment    Goals:  Date  (Body Area, Impairment Goal, Functional   Activity, Target Performance)  Time Frame  Status  Date/  Initial    07/05/2014  Patient will improve seated VCT to >/= 4/5 in order to improve ability to sit > 40 minutes to be able to work at desk.  8 weeks  Progressing:  Initiated NMR to L LE  08/30/14 MG   07/05/2014  Patient will improve LPM to >/= 4/5 in order to improve standing tolerance to > 30 minutes to perform ADL's.  8 weeks  Progressed to standing PNF pattern to improve function 09/06/14 MG   07/05/2014  Patient will improve strength via MMT by >/= 1/2 grade in order to improve ambulation tolerance to > 40 minutes to traverse grocery store.  8 weeks  Progressing:  Instructed to check for inhibition of L LE ER, and perform ab series to facilitate core and improve LE strength   08/30/14 MG   07/05/2014  Patient will be independent with HEP in order to maintain gains made in PT and promote independent patient management of condition and symptoms.  8 weeks  In progress added wall  lunge and unilat brace to improve core and NM control LEs 08/17/14 MG   07/05/2014  Patient will improve Oswestry to < 25% in order to show objective improvement in the function of l/s.   Progressing:  34% VS 52%  08/30/14 MG     Patient requires continued skilled care to:  continue to improve bilateral lumbopelvic mobility, stability, NM control to improve function and return patient to PLOF    Plan:  Continue with Plan of Care   Pulled fwd:   Progress standing NMR to improve R and L SLS   Progress mass flexion and elongation patterns  Progress lift training to incorporate turning with weight    Chipper Oman, DPT [phone]   09/20/2014

## 2014-09-21 ENCOUNTER — Ambulatory Visit
Admission: RE | Admit: 2014-09-21 | Discharge: 2014-09-21 | Disposition: A | Discharge status: Home / Self Care | Payer: BLUE CROSS/BLUE SHIELD | Source: Ambulatory Visit | Attending: Physical Medicine & Rehabilitation | Admitting: Physical Medicine & Rehabilitation

## 2014-09-21 DIAGNOSIS — M5020 Other cervical disc displacement, unspecified cervical region: Secondary | ICD-10-CM

## 2014-09-21 DIAGNOSIS — M5010 Cervical disc disorder with radiculopathy, unspecified cervical region: Secondary | ICD-10-CM | POA: Insufficient documentation

## 2014-09-21 DIAGNOSIS — M4802 Spinal stenosis, cervical region: Secondary | ICD-10-CM | POA: Insufficient documentation

## 2014-09-21 DIAGNOSIS — M5022 Other cervical disc displacement, mid-cervical region: Secondary | ICD-10-CM | POA: Insufficient documentation

## 2014-09-22 ENCOUNTER — Other Ambulatory Visit (INDEPENDENT_AMBULATORY_CARE_PROVIDER_SITE_OTHER): Payer: Self-pay

## 2014-09-22 ENCOUNTER — Inpatient Hospital Stay: Payer: BLUE CROSS/BLUE SHIELD | Admitting: Rehabilitative and Restorative Service Providers"

## 2014-09-22 DIAGNOSIS — Z029 Encounter for administrative examinations, unspecified: Secondary | ICD-10-CM | POA: Insufficient documentation

## 2014-09-22 DIAGNOSIS — M5442 Lumbago with sciatica, left side: Secondary | ICD-10-CM

## 2014-09-22 DIAGNOSIS — M5441 Lumbago with sciatica, right side: Secondary | ICD-10-CM

## 2014-09-24 DIAGNOSIS — E119 Type 2 diabetes mellitus without complications: Secondary | ICD-10-CM

## 2014-09-24 HISTORY — DX: Hemochromatosis, unspecified: E83.119

## 2014-09-24 HISTORY — DX: Type 2 diabetes mellitus without complications: E11.9

## 2014-09-29 ENCOUNTER — Inpatient Hospital Stay
Payer: BLUE CROSS/BLUE SHIELD | Attending: Neurological Surgery | Admitting: Rehabilitative and Restorative Service Providers"

## 2014-09-29 DIAGNOSIS — M5441 Lumbago with sciatica, right side: Secondary | ICD-10-CM | POA: Insufficient documentation

## 2014-09-29 NOTE — PT/OT Therapy Note (Signed)
DAILY NOTE   09/29/2014     Time In/Out: 9:16 - 9:59 Total time: 43 minutes    Visit # 18    # of Authorized Visits: 24 Visit #: 18    Diagnosis (Treating/Medical):     ICD-10-CM    1. Bilateral low back pain with right-sided sciatica M54.41        Subjective:   Been trying to do some stretching especially upper body. Everything has been hurting for the last week or so. I could barely get out of bed this morning. Pain in my hands is absolutely brutal, especially L thumb. I haven't had any leg weakness where I've had huge instability. My leg hasn't just given out on me. My pain levels have not been that bad - the pain level is about a 4/10.     Objective:   Treatment:  Therapeutic Exercise: to improve AROM/ flexibility, strength  RCB x 6' with random hills and L6 resistance and subjective recorded  LP 120 lbs x 2'   Step-ups L2 step x 2'    NMR:   Following performed on foam pad with breaks as needed for 3' in each position  Feet together EO  Transitioned to ball around back  Semi-tandem L in front of R  Transitioned to ball around back  Semi-tandem R in front of L  Transitioned to ball around back    TA:  Quadruped arch/sag with emphasis on segmental mobility and performing posterior weight shifts and into quadrants  Transitioned to core exercises in quadruped, however discontinued after patient reported increasing dizziness and vitals were taken (see below)    Current Measurements (ROM, Strength, Girth, Outcomes, etc.):   Patient with dizziness after quadruped exercises  BP 161/88 mmHg  HR: 98 bpm  Modalities: None - ice at home as needed     Assessment (response to treatment):  Patient with increased pain during today's session and with dizziness at times throughout session and increased n/t in UE's and LE's. MD's are aware of symptoms. From a MSK and lbp perspective, patient doing well with increased strength and decreasing lbp with significantly less radiculopathy LE.    Progress towards functional goals: see  assessment    Goals:  Date  (Body Area, Impairment Goal, Functional   Activity, Target Performance)  Time Frame  Status  Date/  Initial    07/05/2014  Patient will improve seated VCT to >/= 4/5 in order to improve ability to sit > 40 minutes to be able to work at desk.  8 weeks  Progressing:  Initiated NMR to L LE  08/30/14 MG   07/05/2014  Patient will improve LPM to >/= 4/5 in order to improve standing tolerance to > 30 minutes to perform ADL's.  8 weeks  Progressed to standing PNF pattern to improve function 09/06/14 MG   07/05/2014  Patient will improve strength via MMT by >/= 1/2 grade in order to improve ambulation tolerance to > 40 minutes to traverse grocery store.  8 weeks  Progressing:  Instructed to check for inhibition of L LE ER, and perform ab series to facilitate core and improve LE strength   08/30/14 MG   07/05/2014  Patient will be independent with HEP in order to maintain gains made in PT and promote independent patient management of condition and symptoms.  8 weeks  In progress added wall lunge and unilat brace to improve core and NM control LEs 08/17/14 MG   07/05/2014  Patient will improve Oswestry  to < 25% in order to show objective improvement in the function of l/s.   Progressing:  34% VS 52%  08/30/14 MG     Patient requires continued skilled care to: continue to improve bilateral lumbopelvic mobility, stability, NM control to improve function and return patient to PLOF    Plan:  Continue with Plan of Care   Progress standing NMR to improve R and L SLS   Progress lift training to incorporate turning with weight    Chipper Oman, DPT [phone]   09/29/2014

## 2014-10-01 ENCOUNTER — Inpatient Hospital Stay
Payer: BLUE CROSS/BLUE SHIELD | Attending: Neurological Surgery | Admitting: Rehabilitative and Restorative Service Providers"

## 2014-10-01 DIAGNOSIS — M5441 Lumbago with sciatica, right side: Secondary | ICD-10-CM | POA: Insufficient documentation

## 2014-10-01 NOTE — PT/OT Therapy Note (Signed)
DAILY NOTE   10/01/2014     Time In/Out: 9:15 - 9:57 Total time: 42 minutes    Visit # 19    # of Authorized Visits: 24 Visit #: 19    Diagnosis (Treating/Medical):     ICD-10-CM    1. Bilateral low back pain with right-sided sciatica M54.41        Subjective:   Kent Jordan reports that he is feeling much better than he did the other day. Was in a lot of pain yesterday, but still went to work at [employer]. By 2:30, had to leave, started using cane around 1pm. Today, feeling so much better though.    Objective:   Treatment:  Therapeutic Exercise: to improve AROM/ flexibility, strength  RCB x 6' with L10 resistance and large hills with subjective recorded  LP 130 lbs x 5 reps, transitioned to 110 lbs x 3'  Slant board tension on/tension off x 2'  Cybex knee flexion with 3 plates of resistance and cues for eccentric control  Free-motion machine with 5 lbs resistance slow and controlled shoulder abd/adduction with focus on utilizing core    NMR:   Following performed on foam pad with breaks as needed for 2-3' in each position  Feet together EO  Transitioned to ball overhead and around back  Semi-tandem L in front of R  Transitioned to ball around back and overhead  Semi-tandem R in front of L  Transitioned to ball around back and overhead    Rocker-board AP direction with cues for slow and controlled motion  Rocker-board AP direction with cues for stabilizing in center of board  Rocker-board ML direction with cues for slow and controlled motion  Rocker-board ML direction with cues for stabilizing in center of board    TA:  n/a  Current Measurements (ROM, Strength, Girth, Outcomes, etc.):     Modalities: None - ice at home as needed     Assessment (response to treatment):  Patient with much reduced symptoms during today's session. Overall fatigue levels have improved and patient able to perform many more exercises today with less need for rest breaks.     Progress towards functional goals: patient returned to work yesterday and able to  work for 8.5 hours    Goals:  Date  (Body Area, Impairment Goal, Functional   Activity, Target Performance)  Time Frame  Status  Date/  Initial    07/05/2014  Patient will improve seated VCT to >/= 4/5 in order to improve ability to sit > 40 minutes to be able to work at desk.  8 weeks  Progressing:  Initiated NMR to L LE  08/30/14 MG   07/05/2014  Patient will improve LPM to >/= 4/5 in order to improve standing tolerance to > 30 minutes to perform ADL's.  8 weeks  Progressed to standing PNF pattern to improve function 09/06/14 MG   07/05/2014  Patient will improve strength via MMT by >/= 1/2 grade in order to improve ambulation tolerance to > 40 minutes to traverse grocery store.  8 weeks  Progressing:  Instructed to check for inhibition of L LE ER, and perform ab series to facilitate core and improve LE strength   08/30/14 MG   07/05/2014  Patient will be independent with HEP in order to maintain gains made in PT and promote independent patient management of condition and symptoms.  8 weeks  In progress added wall lunge and unilat brace to improve core and NM control LEs 08/17/14 MG  07/05/2014  Patient will improve Oswestry to < 25% in order to show objective improvement in the function of l/s.   Progressing:  34% VS 52%  08/30/14 MG     Patient requires continued skilled care to: continue to improve bilateral lumbopelvic mobility, stability, NM control to improve function and return patient to PLOF    Plan:  Continue with Plan of Care   Continue to strengthen and improve balance  Progress standing NMR to improve R and L SLS   Progress lift training to incorporate turning with weight    Chipper Oman, DPT [phone]   10/01/2014

## 2014-10-04 ENCOUNTER — Inpatient Hospital Stay: Payer: BLUE CROSS/BLUE SHIELD | Attending: Neurological Surgery | Admitting: Physical Therapist

## 2014-10-04 DIAGNOSIS — M5441 Lumbago with sciatica, right side: Secondary | ICD-10-CM | POA: Insufficient documentation

## 2014-10-04 NOTE — PT/OT Therapy Note (Signed)
DAILY NOTE   10/04/2014     Time In/Out:  10:00am  -  Total time:  minutes    Visit # 20    # of Authorized Visits: 24 Visit #: 20    Diagnosis (Treating/Medical):     ICD-10-CM    1. Bilateral low back pain with right-sided sciatica M54.41        Subjective: Able to walk a mile now without buckling.  I do feel weaker on the left leg still.  When I turn and twist my trunk I feel something in the (points to R SI joint) . Going upstairs is still difficult, I have to go up the stairs to get to the kitchen.       Objective:   Treatment:    MT:  Bony clearing R SI joint  Superior mob of cecum with R heel slide then L LTR FMs    Therapeutic Exercise: to improve AROM/ flexibility, strength  Assisted warmup:  RCB x 5 minutes level 5  with subjective recorded  Supine:  CR to improve R inominate flexion with sacral wedge   Step ups onto 8 inch step 15 reps with verbal cues for co-contraction of glutes and quads  Knee extension x 30 pounds unilat 10 reps x 2 sets    NMR:   L SL:  PA to R inominate to improve R extension with CR towards R hip flexion  End range holds R LE ext to re-ed in new range  Attempted L pelvic PD but with R SI joint pain so addressed R inominate dysfunction  Supine:  Resisted LTR bilaterally with verbal cues for segmental control    Current Measurements (ROM, Strength, Girth, Outcomes, etc.):   Hard endfeel into extension R inominate with swing test  Oswestry: 32% (versus 34% on 09/01/15)     Modalities: None - ice at home as needed     Assessment (response to treatment): Continues to demonstrate improved function per Oswestry. Continues with restriction and hard endfeel into extension with R LE swing test.  Improved R visceral mobility by end of session with improved R pelvic PD during gait.      Progress towards functional goals: see below    Goals:  Date  (Body Area, Impairment Goal, Functional   Activity, Target Performance)  Time Frame  Status  Date/  Initial    07/05/2014  Patient will improve seated  VCT to >/= 4/5 in order to improve ability to sit > 40 minutes to be able to work at desk.  8 weeks  Progressing:  Initiated NMR to L LE  08/30/14 MG   07/05/2014  Patient will improve LPM to >/= 4/5 in order to improve standing tolerance to > 30 minutes to perform ADL's.  8 weeks  Progressed to standing PNF pattern to improve function 09/06/14 MG   07/05/2014  Patient will improve strength via MMT by >/= 1/2 grade in order to improve ambulation tolerance to > 40 minutes to traverse grocery store.  8 weeks  Progressing:  Instructed to check for inhibition of L LE ER, and perform ab series to facilitate core and improve LE strength   08/30/14 MG   07/05/2014  Patient will be independent with HEP in order to maintain gains made in PT and promote independent patient management of condition and symptoms.  8 weeks  In progress added wall lunge and unilat brace to improve core and NM control LEs 08/17/14 MG   07/05/2014  Patient will  improve Oswestry to < 25% in order to show objective improvement in the function of l/s.   Progressing:  32%   10/04/14 MG     Patient requires continued skilled care to: continue to improve bilateral lumbopelvic mobility, stability, NM control to improve function and return patient to PLOF    Plan:  Continue with Plan of Care   Pulled fwd:   Continue to strengthen and improve balance  Progress standing NMR to improve R and L SLS   Progress lift training to incorporate turning with weight    Hassell Halim PT, MSPT  [phone]   10/04/2014

## 2014-10-06 ENCOUNTER — Inpatient Hospital Stay: Payer: BLUE CROSS/BLUE SHIELD | Attending: Neurological Surgery | Admitting: Physical Therapist

## 2014-10-06 DIAGNOSIS — M5441 Lumbago with sciatica, right side: Secondary | ICD-10-CM | POA: Insufficient documentation

## 2014-10-06 NOTE — PT/OT Therapy Note (Signed)
DAILY NOTE   10/06/2014     Time In/Out:  10:00am  - 10:42am  Total time: 42  minutes    Visit # 21    # of Authorized Visits: 24 Visit #: 21    Diagnosis (Treating/Medical):     ICD-10-CM    1. Bilateral low back pain with right-sided sciatica M54.41        Subjective: L glut is very sore since last treatment, like I have been really working the muscle. L knee is no longer buckling with stairs.      Objective:   Treatment:    MT:  Supine:  STMobs to inferior border of L glut with L knee flex/ ext   Maisie Fus position : STMobs to R hip flexors and quads with CR towards R hip flexion and knee flex/ ext FM to improve R hip extension during gait  Inferior glide R femur at end range flexion with CR towards extension grade 4  R LE long axis traction with thrust to improve R inominate mobility     Therapeutic Exercise: to improve AROM/ flexibility, strength  Assisted warmup:  RCB x 5 minutes level 5  with subjective recorded  Forward to side press with rotation 7 pound ball      NMR:   Supine:  Mid range holds R LE flex/ add/ ER to facilitate R hip stabilizers  RS in bridge position   Standing:  Weight shift training forward with visual cues from mirror to prevent lateral pelvic shears with weight acceptance  Rhythmic stabilization at full weight acceptance to improve stability       Current Measurements (ROM, Strength, Girth, Outcomes, etc.):   Decreased R hip extension during gait  Lateral pelvic weight shift with weight shifts forward    Modalities: None - ice at home as needed     Assessment (response to treatment): Lateral pelvic shear abolished after weight shift training forwards.  Improved stability in stagger stance after NMR with improved R hip extension.    Progress towards functional goals: see below    Goals:  Date  (Body Area, Impairment Goal, Functional   Activity, Target Performance)  Time Frame  Status  Date/  Initial    07/05/2014  Patient will improve seated VCT to >/= 4/5 in order to improve ability to sit  > 40 minutes to be able to work at desk.  8 weeks  Progressing:  Initiated NMR to L LE  08/30/14 MG   07/05/2014  Patient will improve LPM to >/= 4/5 in order to improve standing tolerance to > 30 minutes to perform ADL's.  8 weeks  Progressing:  Focused on forwards weight shift to improve pelvic stability during gait 10/06/13 MG   07/05/2014  Patient will improve strength via MMT by >/= 1/2 grade in order to improve ambulation tolerance to > 40 minutes to traverse grocery store.  8 weeks  Progressing:  Instructed to check for inhibition of L LE ER, and perform ab series to facilitate core and improve LE strength   08/30/14 MG   07/05/2014  Patient will be independent with HEP in order to maintain gains made in PT and promote independent patient management of condition and symptoms.  8 weeks  In progress added wall lunge and unilat brace to improve core and NM control LEs 08/17/14 MG   07/05/2014  Patient will improve Oswestry to < 25% in order to show objective improvement in the function of l/s.   Progressing:  32%  10/04/14 MG     Patient requires continued skilled care to: pulled fwd: continue to improve bilateral lumbopelvic mobility, stability, NM control to improve function and return patient to PLOF    Plan:  Continue with Plan of Care   Pulled fwd:   Continue to strengthen and improve balance  Progress standing NMR to improve R and L SLS   Progress bilateral hip inominate mobility to improve gait mechanics    Hassell Halim PT, MSPT  [phone]   10/06/2014

## 2014-10-11 ENCOUNTER — Inpatient Hospital Stay: Payer: BLUE CROSS/BLUE SHIELD | Attending: Neurological Surgery | Admitting: Physical Therapist

## 2014-10-11 DIAGNOSIS — M5441 Lumbago with sciatica, right side: Secondary | ICD-10-CM | POA: Insufficient documentation

## 2014-10-11 NOTE — PT/OT Therapy Note (Signed)
DAILY NOTE   10/11/2014     Time In/Out:  9:55am  - 10:43am   Total time: 48 minutes    Visit # 22    # of Authorized Visits: 24 Visit #: 22    Diagnosis (Treating/Medical):     ICD-10-CM    1. Bilateral low back pain with right-sided sciatica M54.41        Subjective: During the day my leg is fine, but then my knee starts to bother me  (points to L patellar tendon) and then it bothers my back.      Objective:   Treatment:    MT:  Supine:  Bony clearing L patella  STMobs L patellar tendon/ quads- central parallels and circumfrential mobs    NMR:   Supine:  RS in bridge position x 3 rounds  R SL:  L pelvic PD end range holds with onset of R SI pain so modified to  L pelvic PE to lengthen L QL  L pelvic AD end range holds to improve weight acceptance with continued R SI pain so performed superficial skin and fascia mobs to sacrum  Standing:  End range holds at full weight acceptance L LE with approximation at bilateral iliac crests to improve stability      Current Measurements (ROM, Strength, Girth, Outcomes, etc.):     Soft tissue restriction noted L quads, patellar tendon (sharp pain with palpation of patellar tendon)   Gait: assessment: Decreased bilateral pelvic PD     Modalities: None - ice at home as needed     Assessment (response to treatment): Improved excursion with bilateral hip extension and weight acceptance to improve gait mechanics.  L pelvic patterns causing R SI pain, may benefit from ST management of sacral region to decrease R SI pain.  Addressed L knee dysfunction to decrease mechanical strain to LB.      Progress towards functional goals:     Goals:  Date  (Body Area, Impairment Goal, Functional   Activity, Target Performance)  Time Frame  Status  Date/  Initial    07/05/2014  Patient will improve seated VCT to >/= 4/5 in order to improve ability to sit > 40 minutes to be able to work at desk.  8 weeks  Progressing:  Initiated NMR to L LE  08/30/14 MG   07/05/2014  Patient will improve LPM to >/=  4/5 in order to improve standing tolerance to > 30 minutes to perform ADL's.  8 weeks  Progressing:  Focused on forwards weight shift to improve pelvic stability during gait 10/06/13 MG   07/05/2014  Patient will improve strength via MMT by >/= 1/2 grade in order to improve ambulation tolerance to > 40 minutes to traverse grocery store.  8 weeks  Progressing:  Instructed to check for inhibition of L LE ER, and perform ab series to facilitate core and improve LE strength   08/30/14 MG   07/05/2014  Patient will be independent with HEP in order to maintain gains made in PT and promote independent patient management of condition and symptoms.  8 weeks  In progress added wall lunge and unilat brace to improve core and NM control LEs 08/17/14 MG   07/05/2014  Patient will improve Oswestry to < 25% in order to show objective improvement in the function of l/s.   Progressing:  32%   10/04/14 MG     Patient requires continued skilled care to: pulled fwd: continue to improve bilateral lumbopelvic mobility, stability, NM  control to improve function and return patient to PLOF    Plan:  Continue with Plan of Care   Pulled fwd:   Continue to strengthen and improve balance  Progress standing NMR to improve R and L SLS   Progress bilateral hip inominate mobility to improve gait mechanics    Hassell Halim PT, MSPT  [phone]   10/11/2014

## 2014-10-13 ENCOUNTER — Inpatient Hospital Stay
Payer: BLUE CROSS/BLUE SHIELD | Attending: Neurological Surgery | Admitting: Rehabilitative and Restorative Service Providers"

## 2014-10-13 DIAGNOSIS — M5441 Lumbago with sciatica, right side: Secondary | ICD-10-CM | POA: Insufficient documentation

## 2014-10-13 NOTE — PT/OT Therapy Note (Signed)
DAILY NOTE   10/13/2014     Time In/Out: 10:01 - 10:45  Total time: 44 minutes    Visit: 23    # of Authorized Visits: 24 Visit #: 23    Diagnosis (Treating/Medical):     ICD-10-CM    1. Bilateral low back pain with right-sided sciatica M54.41        Subjective: What PT Gonzaga did to my L knee has worked and I haven't had pain there with going up/down stairs. The R lower back still hurts but even my L side is the most painful of all the motions.     Objective:   Treatment:  Manual Therapy:  Increased time with ST mobs along borders of implanted device to decrease restrictions as patient with increased pain ST surrounding device  ST mobs along L SI with bony contour clearing and tracing out along borders of posterior sacrum and innominate on L  ST mobs L QL with FMP wealthy pelvis  ST mobs L glutes with FMP wealthy pelvis    NMR:   L Pelvic AE with end range holds and COI to increase core initiation to improve pelvic girdle stability  L Pelvic AE with end range holds and COI with LE flex/ adduction/ IR/ DF/ Inv to improve neuromuscular firing to improve stepping ability  L Pelvic posterior depression with end range holds to increase weight bearing into L LE to improve weight acceptance    Current Measurements (ROM, Strength, Girth, Outcomes, etc.):     Modalities: None - ice at home as needed     Assessment (response to treatment):   Focused on ST mobility throughout L SI and surrounding implanted device as patient with increased pain. Able to decrease pain by end of session and transitioned to core stabilization with pelvic patterns L LE with patient reporting decreased symptoms R SI when moving into L AE compared to previous visits. Patient with 1-2 more sessions to make patient as functional as possible and allow patient independent management of condition and symptoms with HEP.    Progress towards functional goals: see assessment    Goals:  Date  (Body Area, Impairment Goal, Functional   Activity, Target  Performance)  Time Frame  Status  Date/  Initial    07/05/2014  Patient will improve seated VCT to >/= 4/5 in order to improve ability to sit > 40 minutes to be able to work at desk.  8 weeks  Progressing:  Initiated NMR to L LE  08/30/14 MG   07/05/2014  Patient will improve LPM to >/= 4/5 in order to improve standing tolerance to > 30 minutes to perform ADL's.  8 weeks  Progressing:  Focused on forwards weight shift to improve pelvic stability during gait 10/06/13 MG   07/05/2014  Patient will improve strength via MMT by >/= 1/2 grade in order to improve ambulation tolerance to > 40 minutes to traverse grocery store.  8 weeks  Progressing:  Instructed to check for inhibition of L LE ER, and perform ab series to facilitate core and improve LE strength   08/30/14 MG   07/05/2014  Patient will be independent with HEP in order to maintain gains made in PT and promote independent patient management of condition and symptoms.  8 weeks  In progress added wall lunge and unilat brace to improve core and NM control LEs 08/17/14 MG   07/05/2014  Patient will improve Oswestry to < 25% in order to show objective improvement in the function  of l/s.   Progressing:  32%   10/04/14 MG     Patient requires continued skilled care to: continue to improve bilateral lumbopelvic mobility, stability, NM control to improve function and return patient to PLOF; update HEP and allow for safe discharge home    Plan:  Continue with Plan of Care   Strengthen B LE's, balance/core  Improve gait mechanics  Update HEP    Chipper Oman, DPT [phone]   10/13/2014

## 2014-10-18 ENCOUNTER — Inpatient Hospital Stay: Payer: BLUE CROSS/BLUE SHIELD | Admitting: Physical Therapist

## 2014-10-20 ENCOUNTER — Inpatient Hospital Stay
Payer: BLUE CROSS/BLUE SHIELD | Attending: Neurological Surgery | Admitting: Rehabilitative and Restorative Service Providers"

## 2014-10-20 DIAGNOSIS — M5441 Lumbago with sciatica, right side: Secondary | ICD-10-CM | POA: Insufficient documentation

## 2014-10-20 NOTE — PT/OT Therapy Note (Signed)
DAILY NOTE   10/20/2014     Time In/Out: 10:00 - 10:43  Total time: 43 minutes    Visit: 24    # of Authorized Visits:   Visit #:      Diagnosis (Treating/Medical):     ICD-10-CM    1. Bilateral low back pain with right-sided sciatica M54.41        Subjective: I was walking in the snow the other day to pick up some bags for my dog and I was knee deep in the snow and I stepped oddly and it twisted my back and I felt a good amount of pain. But it's better now and I was able to do some exercises you showed me and I was ok. I'm going up/down stairs with reciprocal pattern and able to ambulate long distances. I haven't had to change the parameters for my stimulator - pain has been well controlled. I'm happy and ready for a break from PT.    Objective:   Treatment:  Manual Therapy:  N/a    NMR:   Continued to perform pelvic patterns with prolonged holds to re-train core and improve balance strategies  L Pelvic AE with end range holds and COI to increase core initiation to improve pelvic girdle stability  L Pelvic AE with end range holds and COI with LE flex/ adduction/ IR/ DF/ Inv to improve neuromuscular firing to improve stepping ability  L Pelvic posterior depression with end range holds to increase weight bearing into L LE to improve weight acceptance    Following performed for balance on foam pad for 1-2 minutes each position:  Narrow BOS EO   Transitioned to museum watching  Semi-tandem L in front of R LE EO  Transitioned to [employer] watching  Semi-tandem R in front of L LE EO  Rransitioned to [employer] watching    Therapeutic Exercise:  RCB x 6' with L10 resistance and large hills with subjective recorded  LP 110 lbs x 3' with cues for slow and controlled motion    Current Measurements (ROM, Strength, Girth, Outcomes, etc.):     Oswestry: 24% (compared to 52% IE)  PSFS: 20% with satisfaction 9/10 (compared to 37% pain with satisfaction 1/10 IE)    MMT LE's:  Hip flexion: R: 4+, L: 4  Knee flexion: R: 5, L; 5  Knee  extension: R: 5, L: 5   Ankle DF: R: 5, L: 4    Modalities: None - ice at home as needed     Assessment (response to treatment):   Patient is doing very well and is no longer functionally limited. Patient is being discharged to HEP with emphasis on adherence during session to ensure patient continues to maintain gains from PT and promote independent patient management of condition and symptoms.    Progress towards functional goals: see below    Goals:  Date  (Body Area, Impairment Goal, Functional   Activity, Target Performance)  Time Frame  Status  Date/  Initial    07/05/2014  Patient will improve seated VCT to >/= 4/5 in order to improve ability to sit > 40 minutes to be able to work at desk.  8 weeks  Functionally met 10/20/14 [facility]   07/05/2014  Patient will improve LPM to >/= 4/5 in order to improve standing tolerance to > 30 minutes to perform ADL's.  8 weeks  In progress - difficulty to stand still due to balance 10/20/14 [facility]   07/05/2014  Patient will improve strength via  MMT by >/= 1/2 grade in order to improve ambulation tolerance to > 40 minutes to traverse grocery store.  8 weeks  Met  10/20/14 [facility]   07/05/2014  Patient will be independent with HEP in order to maintain gains made in PT and promote independent patient management of condition and symptoms.  8 weeks  Met 10/20/14 [facility]   07/05/2014  Patient will improve Oswestry to < 25% in order to show objective improvement in the function of l/s.   Met - 24% 10/20/14 [facility]     Patient no longer requires continued skilled care.    Plan:  Discharge with HEP.    Chipper Oman, DPT [phone]   10/20/2014

## 2014-11-23 DIAGNOSIS — D759 Disease of blood and blood-forming organs, unspecified: Secondary | ICD-10-CM

## 2014-11-23 HISTORY — DX: Disease of blood and blood-forming organs, unspecified: D75.9

## 2014-12-13 ENCOUNTER — Other Ambulatory Visit: Payer: Self-pay | Admitting: Gastroenterology

## 2014-12-13 DIAGNOSIS — R188 Other ascites: Secondary | ICD-10-CM

## 2014-12-17 ENCOUNTER — Ambulatory Visit
Admission: RE | Admit: 2014-12-17 | Discharge: 2014-12-17 | Disposition: A | Discharge status: Home / Self Care | Payer: BLUE CROSS/BLUE SHIELD | Source: Ambulatory Visit | Attending: Gastroenterology | Admitting: Gastroenterology

## 2014-12-17 DIAGNOSIS — R7989 Other specified abnormal findings of blood chemistry: Secondary | ICD-10-CM | POA: Insufficient documentation

## 2014-12-17 DIAGNOSIS — R188 Other ascites: Secondary | ICD-10-CM

## 2015-01-17 ENCOUNTER — Inpatient Hospital Stay
Payer: BLUE CROSS/BLUE SHIELD | Attending: Physician Assistant | Admitting: Rehabilitative and Restorative Service Providers"

## 2015-01-17 DIAGNOSIS — M25511 Pain in right shoulder: Secondary | ICD-10-CM | POA: Insufficient documentation

## 2015-01-17 NOTE — PT/OT Therapy Note (Signed)
INITIAL EVALUATION (Shoulder)      Name: Kent Jordan Age: 64 y.o. Occupation: Retired SOC: 01/17/2015  Referring Physician: Harvie Junior Sunday, PA MD recheck: TBD DOS: NA DOI: Onset of Problem / Injury: 01/12/15  Diagnosis (Treating/Medical): The encounter diagnosis was Right shoulder pain.   # of Authorized Visits: 12 Visit # 1 today     SUBJECTIVE:    Mechanism of Injury:   Rx for "adhesive capsulitis, shoulder arthropathy". Pt reports sx have been going on for a few months. Says "within the last 3-4 months I've contracted diabetes. My pain management says it's either a torn RTC or adhesive capsulitis after she did these little tests. I'm having a Cortizone shot on Wednesday. I had a couple in my neck 3-4 months ago and haven't had much pain in my neck or down my arm since." Pt reports some "pins and needs" in R hand as well.     Location:   Lateral deltoid at acromion   Onset:  immediate   Aggravating:  laying on R side, reaching across chest, pushing movements, raising arm out to side, making L hand turns when driving   Alleviating:  Rest, not moving into aggravating positions     *Pt also reports having hemochromotosis (too much iron) and reports has retired to focus on health    Patient's reason for seeking PT /:   get back to playing golf, travel, be as active as possible     Past Medical History:   Past Medical History   Diagnosis Date   . Back pain    . Hypertensive disorder    . High cholesterol    . Arthritis    . Failed back syndrome    . Pneumonia 11/2009   . Bilateral cataracts      removed 2009   . Sleep apnea      Does not use CPAP   . Neuropathy      Tingling left foot, numbness left toes   . Low back pain    . Difficulty in walking(719.7)      Uses cane to ambulate   . Elevated liver function tests    . Post-operative nausea and vomiting      apfel-1     Medications: .  adalimumab (HUMIRA) 40 MG/0.8ML injection  .  benazepril (LOTENSIN) 20 MG tablet  .  folic acid (FOLVITE) 400 MCG tablet  .   ZETIA 10 MG tablet        Other Treatment/Prior Therapy: [facility]   Prior Hospitalization:   Aug 2015 for implanting neurostimulator, Feb 2013 Fusion L3-S1  Hand Dominance:   Involved Side:   R   Tests:       x-ray 2015 "HISTORY: Right shoulder pain.    TECHNIQUE: Three views of the right shoulder were obtained.     PRIORS: None.    FINDINGS:    The acromioclavicular and glenohumeral joints are maintained. There  are no evidence of acute fractures. There are no abnormal soft tissue  calcifications.    IMPRESSION:   Examination within normal limits.     Georgana Curio, MD   08/30/2014 11:33 AM"      Outcome Measure:R SPADI 44%         % Pain Score: 23%   Rate Satisfaction with Current Function: 4/10  PLOF:   Previously able to reach overhead, across chest without issue.     Patient lives with:      OBJECTIVE:  Vitals:       BP: 123/82  HR: 97   IPTCSHOULDER      Observation/Posture/Gait/Integumentary:  Observation of posture:   Standing: post/post alignment, upper cervical extension,  Post view: bilat shoulder abduction (R > L), R shoulder depressed vs L, bilat UE IR,   Elbow Flexion Test =    L: 3/5   R: 1/5 with poor initiation    Girth: None noted  Integumentary: No wound, lesion or rash noted    Range of Motion: (degrees)    InitialRight  AROM InitialRight  PROM Shoulder InitialLeft AROM InitialLeft PROM   120  Seated Flexion 142    96  Abduction 150    WFL  Elbow flexion WFL    WFL  Elbow extension WFL    T12  IR HBB T10    C4  ER HBH C5    (blank fields were intentionally left blank)    PROM R Shoulder: WFL in all planes     Cervical AROM: <50% bilat       Strength:    R UE Strength  MMT = /5 L   4- Seated Shoulder Flexion 4   4- with pain Shoulder Abduction 4   4+ Shoulder External Rotation 4   4+ with pain  Shoulder Internal Rotation 4+     Palpation: TTP prox biceps tendon      Joint Mobility:   GH jt: dec inferior glide and posterior glide  AC jt: dec gapping     Scapula  mobility/NM function:  R AE: poor folding of STs limiting range, sluggish initiation   R PD: poor range, sluggish initiation,       Special Tests/Neurological Screen:  RTC:  Painful Arc: pos  Drop Arm: neg  Full Can: neg  Supraspinatus/empty can: neg    Labrum/Instability:  Biceps load 90/90: neg  Aprehension: neg    CIT: pos improvement in initiation/strength of R shoulder flex, poor cervical endurance noted     Sensation to Light touch: intact    Treatment Initial Visit:  Evaluation  Patient Education/TherEx on PT role, eval findings, discussed neck/shoulder correlation/function, discussed adhesive capsulitis dx and RTC dx but findings suggest weakness in cervical spine and shoulder musculature vs tearing and A/ROM suggesting adhesive capsulitis unlikely  Manual N/A  Modalities: None  Barriers to rehabilitation: None  Rehab Potential:excellent  Is patient aware of diagnosis: Yes      Imani Sherrin B. Sheliah Hatch, DPT  01/17/2015    Time In/Out:  7:00 - 7:40 Total Treatment Time:       Plan of Care / Updated Plan of Care IPTC Medicare Provider #: [id number]                Patient Name: Kent Jordan  MRN: [id number]  DOI: Onset of Problem / Injury: 01/12/15 DOS: N/A  SOC: 01/17/2015    Diagnosis:     ICD-10-CM    1. Right shoulder pain M25.511        ASSESSMENT: the patient is a 64 y.o. male presenting with R shoulder pain who requires Physical Therapy for the following:  Impairments: Dec R shoulder ROM/Strength, poor posturing with poor core initiation with EFT, dec cervical ROM with pos cervical impact test, poor cervical endurance.     Functional Limitations: unable to lay on R side, pain with reaching across chest, difficulty/pain with pushing movements, raising arm out to side, making L hand turns when driving     Plan Of Care:  Body Mechanics Education, NMR, Statistician, Instruction in HEP, Therapeutic Exercise and Soft Tissue/Joint Mobilization (A/c, GH mobilizations, FMs, scap FMs,)    Frequency/Duration: 2  times a week for 8 weeks. Anticipated D/C date: 03/14/15    Goals:  Date (Body Area, Impairment Goal, Functional   Activity, Target Performance) Time Frame Status Date/  Initial   01/17/2015   Pt will inc bilat shld elevation to >140 with good scapulohumeral and GH rhythm and no c/o impingement or P! To allow lifting dishes into [facility] cabinets and dressing daily without pain or limitations    8 weeks Initial Eval    01/17/2015   Pt to improve R UE strength by 1/2 MMT in all planes to improve ADLs  8 weeks Initial Eval    01/17/2015   Patient will demonstrate independence in prescribed HEP with proper form, sets and reps for safe discharge to an independent program.  8 weeks Initial Eval    01/17/2015   Decrease Shoulder Pain and Disability Index (SPADI) to <20% to exceed Minimal Detectable Change (MDC) of 10 pts.   8 weeks Initial Eval      Signature: Maryse Brierley B. Sheliah Hatch, PT, DPT  [place] [address]   Date: 01/17/2015    Signature: Harvie Junior Sunday, PA ____________________________ Date:     Patient Name: Kent Jordan  MRN: [id number]

## 2015-01-21 ENCOUNTER — Inpatient Hospital Stay
Payer: BLUE CROSS/BLUE SHIELD | Attending: Physician Assistant | Admitting: Rehabilitative and Restorative Service Providers"

## 2015-01-21 DIAGNOSIS — M25511 Pain in right shoulder: Secondary | ICD-10-CM

## 2015-01-21 NOTE — PT/OT Therapy Note (Signed)
DAILY NOTE   01/21/2015     Time In/Out: 8:30 - 9:15 Total Treatment Time: 45 minutes Visit Number: 2    # of Authorized Visits: 12 Visit #: 2    Diagnosis (Treating/Medical):     ICD-10-CM    1. Right shoulder pain M25.511        Subjective:  Kent Jordan reports he had a cortisone shot last Wednesday and it has helped. I can sleep on my R side now. I went to reach up for a coffee cup and I had a lot of pain in lateral shoulder. I have recently been diagnosed with Diabetes and I also have too much iron in my blood and it is affecting my liver.     Objective:   Treatment:  Therapeutic Exercise: to improve: Balance, Flexibility/ROM, Stabilization and Strength   Modifications/Patient Education: increased time discussing findings from eval with RTC vs adhesive capsulitis  Shoulder circles in standing - added to HEP    NMR:   Rhythmic initiation R scap AE/PD  R Scapular PD with end range holds and COI for NMR to improve scapular stability for improved postural control  Added irradiation shoulder ext/ER    Therapeutic Activities:    n/a    Manual Therapy:   Strap x 2 for following:  Posterior GH glides with FM's elbow pushing down into foam roll   Gapping GH joint with FM's shoulder abduction  ST mobs R levator scap, UT, scalenes  Bony contour clearing medial border R scap  ST mobs supraspinatus, subscapularis, proximal biceps      Current Measurements (ROM, Strength, Girth, Outcomes, etc.):     Modalities: None     Assessment (response to treatment):   Patient with poor joint mechanics at [facility] joint R UE that was partially addressed during session - increased pain so did not finish all of joint intrinsics motions. Patient would benefit from inf and caudal glides as well as more NMR to scapula.     Progress towards functional goals: see assessment    Patient requires continued skilled care to: improve R AROM and strength as well as scapular stability; improve alignment and joint mobility of shoulder girdle to return patient to  ADL's and reutrn to PLOF    Plan:  Continue with Plan of Care    Kent Jordan, [place] #[id number]  01/21/2015

## 2015-01-24 ENCOUNTER — Inpatient Hospital Stay
Payer: BLUE CROSS/BLUE SHIELD | Attending: Physician Assistant | Admitting: Rehabilitative and Restorative Service Providers"

## 2015-01-24 DIAGNOSIS — M25511 Pain in right shoulder: Secondary | ICD-10-CM | POA: Insufficient documentation

## 2015-01-24 NOTE — PT/OT Therapy Note (Signed)
DAILY NOTE   01/24/2015     Time In/Out: 1:16 - 2:00 Total Treatment Time: 44 minutes Visit Number: 3    # of Authorized Visits: 12 Visit #: 3    Diagnosis (Treating/Medical):     ICD-10-CM    1. Right shoulder pain M25.511        Subjective:  Guhan reports that his shoulder is feeling good today. Gave blood on Friday and is just "out of it." Feels like his body is just lagging behind. No energy.    Objective:   Treatment:  Therapeutic Exercise: to improve: Balance, Flexibility/ROM, Stabilization and Strength   UBE x 6' (fwd) with small hills and L1 resistance and subjective recorded  Shoulder circles in sitting fwd/bkwd to end session    NMR:   R Scapular PD with end range holds and COI for NMR to improve scapular stability for improved postural control  Prolonged holds and COI into end range PD  Performed for several minutes and throughout session  Rhythmic initiation scap AE/PD with emphasis on scap upward and downward rotation    Therapeutic Activities:    n/a    Manual Therapy:   Strap assist for inf R GH mobs with FM's shoulder shrug - performed NMR in new range with prolonged holds (billed with NMR)  ST mobs R pec major, supraspinatus, deltoids  ST mobs R subscap  ST mobs R levator scap, UT and scalenes with FM's posterior scap motions  On-axis R shoulder IR mobs with FM's as needed and NMR in new range IR (billed with NMR)      Current Measurements (ROM, Strength, Girth, Outcomes, etc.):     Modalities: None     Assessment (response to treatment):   Patient with significant reduction in pain and able to complete full fwd and bkwd arm circles with minimal pain and good motion. Patient responding well to [facility] jt intrinsic training, however would continue to benefit from scap stability, particularly PD R scap.     Progress towards functional goals: see assessment    Patient requires continued skilled care to: improve R AROM and strength as well as scapular stability; improve alignment and joint mobility of shoulder  girdle to return patient to ADL's and reutrn to PLOF    Plan:  Continue with Plan of Care    Will [place], [place] #[id number]  01/24/2015

## 2015-01-28 ENCOUNTER — Inpatient Hospital Stay
Payer: BLUE CROSS/BLUE SHIELD | Attending: Physician Assistant | Admitting: Rehabilitative and Restorative Service Providers"

## 2015-01-28 DIAGNOSIS — M25511 Pain in right shoulder: Secondary | ICD-10-CM | POA: Insufficient documentation

## 2015-01-28 NOTE — PT/OT Therapy Note (Signed)
DAILY NOTE   01/28/2015     Time In/Out: 7:00 - 7:39 Total Treatment Time: 39 minutes Visit Number: 4    # of Authorized Visits: 12 Visit #: 4    Diagnosis (Treating/Medical):     ICD-10-CM    1. Right shoulder pain M25.511        Subjective:  Kent Jordan reports shoulder is feeling well, had shot last week and it seemed to help. Pt now able to sleep on side again. Reports neck is a bit sore today. Pt reports following up with podiatrist reg possible PVD.     Objective:   Treatment:  Therapeutic Exercise: to improve: Balance, Flexibility/ROM, Stabilization and Strength   Warm up: UBE x 6' with subj, 3.5 fwd, 2.5 bkw   LPD 40 lbs with 5 sec holds in PD, x 2 min    NMR:   R scap AE/PD RI to assess range throughout session   R scap PE eccentric COI to improve active lengthening into PD  R scap PD holds with irradiation from UE ext/ER    Manual Therapy:   STM through pec maj/min, subscap, teres min, deltoid with FMP "may i be excused" and jackie gleason,         Current Measurements (ROM, Strength, Girth, Outcomes, etc.):     Modalities: None     Assessment (response to treatment):   Pt with significant improvement in ability to reach across chest and reports dec pain since [facility]. Pt with restricted deltoid and would benefit from HHD training to help relieve. Pt also with levator and upper trap ST restrictions limiting ability to lengthen into PD. Improved range noted by end of visit.       Progress towards functional goals:   Goals:  Date  (Body Area, Impairment Goal, Functional     Activity, Target Performance)  Time Frame  Status  Date/   Initial    01/17/2015   Pt will inc bilat shld elevation to >140 with good scapulohumeral and GH rhythm and no c/o impingement or P! To allow lifting dishes into OH cabinets and dressing daily without pain or limitations      8 weeks       01/17/2015    Pt to improve R UE strength by 1/2 MMT in all planes to improve ADLs   8 weeks       01/17/2015    Patient will demonstrate independence in  prescribed HEP with proper form, sets and reps for safe discharge to an independent program.   8 weeks       01/17/2015    Decrease Shoulder Pain and Disability Index (SPADI) to <20% to exceed Minimal Detectable Change (MDC) of 10 pts.    8 weeks           Patient requires continued skilled care to: improve R AROM and strength as well as scapular stability; improve alignment and joint mobility of shoulder girdle to return patient to ADL's and reutrn to PLOF    Plan:  Continue with Plan of Care    Kent Jordan, PT, DPT  [place] [address]     01/28/2015

## 2015-02-01 ENCOUNTER — Inpatient Hospital Stay
Payer: BLUE CROSS/BLUE SHIELD | Attending: Physician Assistant | Admitting: Rehabilitative and Restorative Service Providers"

## 2015-02-01 DIAGNOSIS — M25511 Pain in right shoulder: Secondary | ICD-10-CM | POA: Insufficient documentation

## 2015-02-01 NOTE — PT/OT Therapy Note (Signed)
DAILY NOTE   02/01/2015     Time In/Out: 3:03 - 3:42 Total Treatment Time: 39 minutes Visit Number: 5    # of Authorized Visits: 12 Visit #: 5    Diagnosis (Treating/Medical):     ICD-10-CM    1. Right shoulder pain M25.511        Subjective:  Laray reports exhausted today and back hurting. Shoulder is feeling great (pt demos full ROM). Has been active the past 2 days.     Objective:   Treatment:  Therapeutic Exercise: to improve: Balance, Flexibility/ROM, Stabilization and Strength   W/U UBE x 6' 3 fwd/3bkw with subj, 1.3 resist   Stagger stance, Alt UE eccentric row x 2 min   LPD 40 lbs with 5 sec holds in PD, x 2 min    NMR:   RI AE/PD R scap to assess range/restrictions  R PE eccentric lengthening of scap elevators  R PD holds     Postural stretch on wall with segmental roll     Manual Therapy:   STM through thoracic spine fascia and paraspinals           Current Measurements (ROM, Strength, Girth, Outcomes, etc.):     Abd R AROM: 160     MMT:  R flex: 4+  R abd: 4  R ER: 4+  R IR: 5    Modalities: None     Assessment (response to treatment):   Pt with significantly improved shoulder ROM and strength since SOC. Pt having recent flare of LBP sx this visit. Possible d/c next visit.       Progress towards functional goals:   Goals:  Date  (Body Area, Impairment Goal, Functional     Activity, Target Performance)  Time Frame  Status  Date/   Initial    01/17/2015   Pt will inc bilat shld elevation to >140 with good scapulohumeral and GH rhythm and no c/o impingement or P! To allow lifting dishes into OH cabinets and dressing daily without pain or limitations      8 weeks  met  02/01/15 EBW   01/17/2015    Pt to improve R UE strength by 1/2 MMT in all planes to improve ADLs   8 weeks  Progressing, see measures 02/01/15  02/01/15 EBW   01/17/2015    Patient will demonstrate independence in prescribed HEP with proper form, sets and reps for safe discharge to an independent program.   8 weeks  progressing  02/01/15 EBW     01/17/2015    Decrease Shoulder Pain and Disability Index (SPADI) to <20% to exceed Minimal Detectable Change (MDC) of 10 pts.    8 weeks           Patient requires continued skilled care to: improve R AROM and strength as well as scapular stability; improve alignment and joint mobility of shoulder girdle to return patient to ADL's and reutrn to PLOF    Plan:  Continue with Plan of Care , possible d/c   SPADI    Rutger Salton B. Sheliah Hatch, PT, DPT  [place] [address]     02/01/2015

## 2015-02-04 ENCOUNTER — Inpatient Hospital Stay
Payer: BLUE CROSS/BLUE SHIELD | Attending: Physician Assistant | Admitting: Rehabilitative and Restorative Service Providers"

## 2015-02-04 DIAGNOSIS — M25511 Pain in right shoulder: Secondary | ICD-10-CM | POA: Insufficient documentation

## 2015-02-04 NOTE — PT/OT Therapy Note (Signed)
DAILY NOTE   02/04/2015     Time In/Out: 7:02 - 7:32  Total Treatment Time: 30 minutes Visit Number: 6    # of Authorized Visits: 12 Visit #: 5    Diagnosis (Treating/Medical):     ICD-10-CM    1. Right shoulder pain M25.511        Subjective:  Kent Jordan reports "pretty good, i feel like this is going to be my last day. This has been remarkable"     Objective:   Treatment:  Therapeutic Exercise: to improve: Balance, Flexibility/ROM, Stabilization and Strength   Warm up with UBE fwd/65min bkw with subj   Arm circle FMP with HO provided     Review of HEP and answering questions for d/c     NMR:   PE eccentric COI to lengthen tissue into scap depression  PD iso holds R with terminal pivot prone, COI with UE ER/ext    Manual Therapy:   ST clearing of A/C jt, clavicle contours   STM to upper trap, scalenes, levator scap, deltoid   Gapping of A/C with gentle shoulder shrug          Current Measurements (ROM, Strength, Girth, Outcomes, etc.):       Modalities: None     Assessment (response to treatment):   Kent Jordan has been seen in PT for 6 visits for his R shoulder pain and is being d/c at this time. He has met his goals and significantly improved in ROM, strength and overall mobility. He is reporting dec sx in his shoulder, but has been experiencing inc in LBP recently.    Please see below:     SPADI:  1%  PSFS:  7%  Satisfaction: 10/10  Compliance: "MOST DAYS"    Progress towards functional goals:   Goals:  Date  (Body Area, Impairment Goal, Functional     Activity, Target Performance)  Time Frame  Status  Date/   Initial    01/17/2015   Pt will inc bilat shld elevation to >140 with good scapulohumeral and GH rhythm and no c/o impingement or P! To allow lifting dishes into OH cabinets and dressing daily without pain or limitations      8 weeks  met  02/01/15 EBW   01/17/2015    Pt to improve R UE strength by 1/2 MMT in all planes to improve ADLs   8 weeks  Progressing, see measures 02/01/15  02/01/15 EBW   01/17/2015     Patient will demonstrate independence in prescribed HEP with proper form, sets and reps for safe discharge to an independent program.   8 weeks  met 02/01/15 EBW   01/17/2015    Decrease Shoulder Pain and Disability Index (SPADI) to <20% to exceed Minimal Detectable Change (MDC) of 10 pts.    8 weeks    02/01/15 EBW         Plan:  D/C to HEP    Srishti Strnad B. Sheliah Hatch, PT, DPT  [place] [address]     02/04/2015

## 2015-02-08 ENCOUNTER — Inpatient Hospital Stay: Payer: BLUE CROSS/BLUE SHIELD | Admitting: Rehabilitative and Restorative Service Providers"

## 2015-02-10 ENCOUNTER — Inpatient Hospital Stay: Payer: BLUE CROSS/BLUE SHIELD | Admitting: Rehabilitative and Restorative Service Providers"

## 2015-05-31 ENCOUNTER — Emergency Department: Payer: BLUE CROSS/BLUE SHIELD

## 2015-05-31 ENCOUNTER — Emergency Department
Admission: EM | Admit: 2015-05-31 | Discharge: 2015-05-31 | Disposition: A | Discharge status: Home / Self Care | Payer: BLUE CROSS/BLUE SHIELD | Attending: Emergency Medicine | Admitting: Emergency Medicine

## 2015-05-31 DIAGNOSIS — M1990 Unspecified osteoarthritis, unspecified site: Secondary | ICD-10-CM | POA: Insufficient documentation

## 2015-05-31 DIAGNOSIS — Z7982 Long term (current) use of aspirin: Secondary | ICD-10-CM | POA: Insufficient documentation

## 2015-05-31 DIAGNOSIS — E119 Type 2 diabetes mellitus without complications: Secondary | ICD-10-CM | POA: Insufficient documentation

## 2015-05-31 DIAGNOSIS — G4730 Sleep apnea, unspecified: Secondary | ICD-10-CM | POA: Insufficient documentation

## 2015-05-31 DIAGNOSIS — T783XXA Angioneurotic edema, initial encounter: Secondary | ICD-10-CM | POA: Insufficient documentation

## 2015-05-31 DIAGNOSIS — X58XXXA Exposure to other specified factors, initial encounter: Secondary | ICD-10-CM | POA: Insufficient documentation

## 2015-05-31 DIAGNOSIS — Z87891 Personal history of nicotine dependence: Secondary | ICD-10-CM | POA: Insufficient documentation

## 2015-05-31 DIAGNOSIS — E780 Pure hypercholesterolemia: Secondary | ICD-10-CM | POA: Insufficient documentation

## 2015-05-31 DIAGNOSIS — I10 Essential (primary) hypertension: Secondary | ICD-10-CM | POA: Insufficient documentation

## 2015-05-31 HISTORY — DX: Psoriasis, unspecified: L40.9

## 2015-05-31 MED ORDER — FAMOTIDINE 20 MG PO TABS
20.0000 mg | ORAL_TABLET | Freq: Once | ORAL | Status: AC
Start: 2015-05-31 — End: 2015-05-31
  Administered 2015-05-31: 20 mg via ORAL
  Filled 2015-05-31: qty 1

## 2015-05-31 MED ORDER — DIPHENHYDRAMINE HCL 25 MG PO CAPS
50.0000 mg | ORAL_CAPSULE | Freq: Once | ORAL | Status: AC
Start: 2015-05-31 — End: 2015-05-31
  Administered 2015-05-31: 50 mg via ORAL
  Filled 2015-05-31: qty 2

## 2015-05-31 MED ORDER — DEXAMETHASONE SODIUM PHOSPHATE 10 MG/ML IJ SOLN
10.0000 mg | Freq: Once | INTRAMUSCULAR | Status: AC
Start: 2015-05-31 — End: 2015-05-31
  Administered 2015-05-31: 10 mg via ORAL
  Filled 2015-05-31: qty 1

## 2015-05-31 NOTE — ED Notes (Signed)
Awoke yesterday after nap and had lost his voice. Upper lip swelling later, took benadryl without resolution. Today both lips are swollen, voice coming back but feels swallowing is difficult and hoarse.

## 2015-05-31 NOTE — Discharge Instructions (Signed)
Please stop taking your Benazepril (Lotensin).     Angioedema    You have been seen for an episode of Angioedema.    Angioedema causes swelling of the face. It usually involves the lips, tongue and throat. The most common cause is a reaction to medicine. Most cases of angioedema are mild. They often resolve on their own in a few hours. Rarely the swelling is very severe and it can become life-threatening! In this case, the danger is from swelling of the tongue and throat. This blocks the airway and keeps a person from breathing. Angioedema IS NOT a typical allergic reaction. It does not respond well to usual allergic reaction treatments.     If you are sent home after an evaluation of an angioedema episode, your symptoms were mild. They were not considered life-threatening. Once the swelling starts to improve, "rebound" symptoms are very unlikely. It is also very unlikely that swelling will suddenly get worse.    Angioedema has 2 major causes: A reaction to medicine and an inherited condition.   The most common drugs that cause angioedema are called "ACE Inhibitors." This is a class of medicines. They are by far the most common drugs to cause angioedema. They are used to treat high blood pressure. They are safe for most people. However, up to 0.7% of patients (7 out of 1000) who use them can develop angioedema. The problem is more common in African-Americans. About half of the time, the angioedema develops within the first week of ACE inhibitor use. However, it can happen even after years of uneventful use.     Some common ACE Inhibitor-type medicines are: Benazepril (Lotensin), captopril (Capoten), enalapril (Vasotec), fosinopril (Monopril), lisinopril (Zestril/Prinivil), moexipril (Univasc), perindopril (Aceon), quinapril (Accupril), ramipril (Altace), trandolapril (Mavik).     Hereditary angioedema is passed down from parents to children. It causes a low level of an enzyme. This enzyme is  called "C1 Esterase Inhibitor" or just "C1 Inhibitor." In people with this problem, minor injury to the face can cause an attack of angioedema. However, there may be swelling for no known reason. Hereditary angioedema also often leads to abdominal (belly) pain.    If you have an angioedema episode you think is from an ACE inhibitor, you MUST NOT use ANY of these medicines again! Your doctor should pick a different blood pressure medicine from a different class of drugs. You SHOULD NOT be started on "Angiotensin II receptor-blocking agents." This is because of a possible cross-reaction. These include candesartan (Atacand), eprosartan (Teveten), irbesartan (Avapro), losartan (Cozaar), olmesartan (Benicar), telmisartan (Micardis), valsartan (Diovan).     PLEASE NOTE THAT THESE LISTS OF ACE INHIBITORS AND ANGIOTENSIN II RECEPTOR-BLOCKING AGENTS ARE NOT COMPLETE AND OTHER DRUGS IN THESE CATEGORIES MAY BE ON THE MARKET. CHECK WITH YOUR DOCTOR OR PHARMACIST BEFORE USING ANY NEW BLOOD PRESSURE MEDICINES.      YOU SHOULD GO TO THE NEAREST EMERGENCY DEPARTMENT IMMEDIATELY, BY CALLING 911, IF ANY OF THE FOLLOWING OCCURS:   Swelling in the face makes it hard to breathe or swallow. If this happens, CALL 911 IMMEDIATELY. You can also call the local emergency ambulance number. Have the paramedics take you to the hospital. DO NOT drive yourself to the hospital. Only do so if you have no choice and the ambulance will take a long time.   Any new or repeated swelling of the lips, tongue or throat.   Your swelling gets worse.

## 2015-05-31 NOTE — ED Provider Notes (Signed)
EMERGENCY DEPARTMENT HISTORY AND PHYSICAL EXAM     Physician/Midlevel provider first contact with patient: 05/31/15 5409         Date: 05/31/2015  Patient Name: Kent Jordan    History of Presenting Illness     Chief Complaint   Patient presents with   . Allergic Reaction       History Provided By: Patient and Patient's Son    Chief Complaint: Angioedema  Onset: Yesterday  Timing: Constant  Location: Right upper and left lower lips  Quality: None  Severity: Moderate  Exacerbating factors: None  Alleviating factors: None  Associated Symptoms: "Raspy" voice, cough, and difficulty swallowing  Pertinent Negatives: Fever or recent contact with anyone sick    Additional History: Kent Jordan is a 64 y.o. male with a PMHx of HTN, HLD, and DM presents to the ED with constant angioedema in his right upper and left lower lips that began yesterday. Pt also c/o "raspy" voice, cough, and difficulty swallowing. Pt states that he woke up from a nap yesterday when he noticed he had lost his voice. He notes he developed angioedema throughout the day and he took benadryl with no relief. Pt reports that his voice returned today, but his facial swelling remained. He states that he did not take benadryl today PTA and his son notes that his father's voice is still "raspy" in the ED. Pt reports that he had surgery 1 year ago. He states that he quit smoking 3.5 years ago and drinks beer occasionally. Pt denies fever or recent contact with anyone sick.    PCP: Roderic Palau, DO  SPECIALISTS:    No current facility-administered medications for this encounter.     Current Outpatient Prescriptions   Medication Sig Dispense Refill   . adalimumab (HUMIRA) 40 MG/0.8ML injection Inject 40 mg into the skin every 14 (fourteen) days. Last injection 04/29/14       . aspirin 81 MG chewable tablet Chew 81 mg by mouth daily.     . benazepril (LOTENSIN) 20 MG tablet Take 20 mg by mouth daily.        . ciclopirox (LOPROX) 0.77 % cream Apply  topically 2 (two) times daily.     . clobetasol (TEMOVATE) 0.05 % cream Apply topically 2 (two) times daily.     . cyclobenzaprine (FLEXERIL) 10 MG tablet Take 10 mg by mouth 3 (three) times daily as needed for Muscle spasms.     . DULoxetine (CYMBALTA) 60 MG capsule Take 60 mg by mouth daily.     . folic acid (FOLVITE) 400 MCG tablet Take 400 mcg by mouth daily.     Marland Kitchen glipiZIDE (GLUCOTROL) 10 MG tablet Take 10 mg by mouth 2 (two) times daily before meals.     . Lidocaine-Menthol 4-5 % Patch Apply topically.     . naproxen (NAPROSYN) 500 MG tablet Take 500 mg by mouth 2 (two) times daily with meals.     . rosuvastatin (CRESTOR) 5 MG tablet Take 5 mg by mouth daily.     Marland Kitchen ZETIA 10 MG tablet Take 10 mg by mouth daily.        Marland Kitchen oxyCODONE-acetaminophen (PERCOCET) 5-325 MG per tablet Take 1 tablet by mouth every 4 (four) hours as needed for Pain (Take 1-2 tabs every 4-6 hrs as needed for pain). 100 tablet 0   . Tapentadol HCl (NUCYNTA ER) 200 MG Tablet SR 12 hr Take 1 tablet by mouth 2 (two) times daily. 150mg  2x  daily           Past History     Past Medical History:  Past Medical History   Diagnosis Date   . Back pain    . Hypertensive disorder    . High cholesterol    . Failed back syndrome    . Pneumonia 11/2009   . Bilateral cataracts      removed 2009   . Sleep apnea      Does not use CPAP   . Neuropathy      Tingling left foot, numbness left toes   . Low back pain    . Difficulty in walking(719.7)      Uses cane to ambulate   . Elevated liver function tests    . 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 Psoriasis    . Arthritis      psoriatic and osteoarthritis        Past Surgical History:  Past Surgical History   Procedure Laterality Date   . Lumbar fusion  10/2011   . Microdiscectomy lumbar  11/12     patient had it done 10/12 11/12   . Knee arthrocentesis  1983     right   . Abdominal surgery  03/1971     double hernia repair   . Eye surgery  2009     cataract removal, both eyes    . Back surgery  9/12, 11/12, 2/13      + L-3 to S-1 fusion   . Fracture surgery  1993     right middle finger   . Insertion, spinal cord stimulator generator N/A 05/07/2014     Procedure: DORSAL COLUMN STIMULATOR PLACEMENT;  Surgeon: Tera Helper, MD;  Location: [facility];  Service: Neurosurgery;  Laterality: N/A;  DCS PLACEMENT       Family History:  Family History   Problem Relation Age of Onset   . Heart disease Mother    . Heart disease Father        Social History:  Social History   Substance Use Topics   . Smoking status: Former Smoker -- 1.00 packs/day for 41 years     Types: Cigarettes     Quit date: 09/26/2011   . Smokeless tobacco: Never Used   . Alcohol Use: 1.2 oz/week     2 Cans of beer per week       Allergies:  Allergies   Allergen Reactions   . Ciprofloxacin Swelling   . Gabapentin Other (See Comments)     blurred vision, dizziness       Review of Systems     Review of Systems   Constitutional: Negative for fever, chills and fatigue.   HENT: Positive for trouble swallowing and voice change. Negative for congestion and rhinorrhea.         (+) angioedema   Eyes: Negative for photophobia and visual disturbance.   Respiratory: Positive for cough. Negative for chest tightness and shortness of breath.    Cardiovascular: Negative for chest pain, palpitations and leg swelling.   Gastrointestinal: Negative for nausea, vomiting, abdominal pain and diarrhea.   Genitourinary: Negative for dysuria and hematuria.   Musculoskeletal: Negative for myalgias, back pain, neck pain and neck stiffness.   Skin: Negative for color change and rash.   Allergic/Immunologic:        Allergies: Ciprofloxacin and Gabapentin   Neurological: Negative for weakness, numbness and headaches.   Psychiatric/Behavioral: Negative  for suicidal ideas and hallucinations.       Physical Exam   BP 138/79 mmHg  Pulse 70  Temp(Src) 97.8 F (36.6 C) (Oral)  Resp 18  Ht 5\' 11"  (1.803 m)  Wt 97.523 kg  BMI 30.00 kg/m2  SpO2 96%    Physical Exam    Constitutional: He is oriented to person, place, and time. He appears well-developed and well-nourished. No distress.   HENT:   Head: Normocephalic and atraumatic.   Mild right upper lip and left lower lip angioedma. No stridor, trismus, or drooling. Mildly raspy voice per family.    Eyes: Conjunctivae are normal. No scleral icterus.   Neck: Normal range of motion. No JVD present. No tracheal deviation present.   Cardiovascular: Normal rate, regular rhythm, normal heart sounds and intact distal pulses.  Exam reveals no gallop and no friction rub.    No murmur heard.  Pulmonary/Chest: Effort normal and breath sounds normal. No respiratory distress. He has no wheezes. He has no rales. He exhibits no tenderness.   Abdominal: Soft. Bowel sounds are normal. He exhibits no distension and no mass. There is no tenderness. There is no rebound and no guarding.   Musculoskeletal: Normal range of motion. He exhibits no tenderness.   Neurological: He is alert and oriented to person, place, and time.   Skin: Skin is warm and dry. He is not diaphoretic.   Psychiatric: He has a normal mood and affect. His behavior is normal.   Vitals reviewed.      Diagnostic Study Results     Labs -     Results     ** No results found for the last 24 hours. **          Radiologic Studies -   Radiology Results (24 Hour)     ** No results found for the last 24 hours. **      .    Medical Decision Making   I am the first provider for this patient.    I reviewed the vital signs, available nursing notes, past medical history, past surgical history, family history and social history.    Vital Signs-Reviewed the patient's vital signs.     Patient Vitals for the past 12 hrs:   BP Temp Pulse Resp   05/31/15 1038 138/79 mmHg - 70 18   05/31/15 0924 155/86 mmHg 97.8 F (36.6 C) 86 18       Pulse Oximetry Analysis - Normal 96% on ra    Old Medical Records: Old medical records. Nursing notes.     ED Course:       9:37 AM - Discussed plan for pt to stop  taking ACE inhibitor medication because it is the most likely cause for his angioedema. Pt should follow up with his PCP. Will contact physicians at the [facility] to see if the pt can be scoped today. Will provide benadryl and pepcid in the ED. Pt is agreeable to plan.    10:15 AM - Re-evaluated pt who's angioedema has improved. Pt's voice still appears to be "raspy" per son.    10:23 AM - Paged on-call ENT.     10:37 AM - Discussed pt case with Dr. Gillis Santa, gastroenterology, who agrees with plan to discharge pt to be seen at [facility] for npl.    10:39 AM - Re-evaluated pt and discussed consult with Dr. Gillis Santa. Pt is agreeable to plan and ready for discharge.       Provider Notes: 64  y/o male presenting with intermittent facial swelling over the past day. Took Benadryl yesterday without clear relief.  No obvious trigger, only known allergy is seasonal, and Cipro/gabapentin. He is on an ace-I, likely angioedema. Recommended to stop medication, d/w pcp for new antihypertensive, and will give Benadryl/Pepcid/decadron, d/w ent for npl.      Diagnosis     Clinical Impression:   1. Angioedema, initial encounter        Treatment Plan:   ED Disposition     Discharge [place] Thielman Koestner discharge to home/self care.    Condition at disposition: Stable              _______________________________      Attestations: This note is prepared by Sallee Provencal, acting as scribe for Geannie Risen, MD. The scribe's documentation has been prepared under my direction and personally reviewed by me in its entirety.  I confirm that the note above accurately reflects all work, treatment, procedures, and medical decision making performed by me.    _______________________________    Thea Gist, MD  05/31/15 [phone]

## 2015-06-30 ENCOUNTER — Other Ambulatory Visit: Payer: Self-pay | Admitting: Physician Assistant

## 2015-06-30 DIAGNOSIS — M5134 Other intervertebral disc degeneration, thoracic region: Secondary | ICD-10-CM

## 2015-07-01 ENCOUNTER — Ambulatory Visit
Admission: RE | Admit: 2015-07-01 | Discharge: 2015-07-01 | Disposition: A | Discharge status: Home / Self Care | Payer: BLUE CROSS/BLUE SHIELD | Source: Ambulatory Visit | Attending: Physician Assistant | Admitting: Physician Assistant

## 2015-07-01 ENCOUNTER — Other Ambulatory Visit: Payer: Self-pay | Admitting: Physician Assistant

## 2015-07-01 DIAGNOSIS — Z9689 Presence of other specified functional implants: Secondary | ICD-10-CM | POA: Insufficient documentation

## 2015-07-01 DIAGNOSIS — L4050 Arthropathic psoriasis, unspecified: Secondary | ICD-10-CM

## 2015-07-01 DIAGNOSIS — J984 Other disorders of lung: Secondary | ICD-10-CM | POA: Insufficient documentation

## 2015-07-01 DIAGNOSIS — M5134 Other intervertebral disc degeneration, thoracic region: Secondary | ICD-10-CM | POA: Insufficient documentation

## 2015-07-31 ENCOUNTER — Emergency Department
Admission: EM | Admit: 2015-07-31 | Discharge: 2015-07-31 | Disposition: A | Discharge status: Home / Self Care | Payer: BLUE CROSS/BLUE SHIELD | Attending: Emergency Medicine | Admitting: Emergency Medicine

## 2015-07-31 ENCOUNTER — Emergency Department: Payer: BLUE CROSS/BLUE SHIELD

## 2015-07-31 DIAGNOSIS — I10 Essential (primary) hypertension: Secondary | ICD-10-CM | POA: Insufficient documentation

## 2015-07-31 DIAGNOSIS — G629 Polyneuropathy, unspecified: Secondary | ICD-10-CM | POA: Insufficient documentation

## 2015-07-31 DIAGNOSIS — M1990 Unspecified osteoarthritis, unspecified site: Secondary | ICD-10-CM | POA: Insufficient documentation

## 2015-07-31 DIAGNOSIS — L4050 Arthropathic psoriasis, unspecified: Secondary | ICD-10-CM | POA: Insufficient documentation

## 2015-07-31 DIAGNOSIS — Z7982 Long term (current) use of aspirin: Secondary | ICD-10-CM | POA: Insufficient documentation

## 2015-07-31 DIAGNOSIS — H00012 Hordeolum externum right lower eyelid: Secondary | ICD-10-CM | POA: Insufficient documentation

## 2015-07-31 DIAGNOSIS — Z87891 Personal history of nicotine dependence: Secondary | ICD-10-CM | POA: Insufficient documentation

## 2015-07-31 DIAGNOSIS — E119 Type 2 diabetes mellitus without complications: Secondary | ICD-10-CM | POA: Insufficient documentation

## 2015-07-31 DIAGNOSIS — G4730 Sleep apnea, unspecified: Secondary | ICD-10-CM | POA: Insufficient documentation

## 2015-07-31 DIAGNOSIS — E7800 Pure hypercholesterolemia, unspecified: Secondary | ICD-10-CM | POA: Insufficient documentation

## 2015-07-31 MED ORDER — ERYTHROMYCIN 5 MG/GM OP OINT
TOPICAL_OINTMENT | Freq: Once | OPHTHALMIC | Status: AC
Start: 2015-07-31 — End: 2015-07-31
  Filled 2015-07-31: qty 1

## 2015-07-31 MED ORDER — ACETAMINOPHEN 325 MG PO TABS
650.0000 mg | ORAL_TABLET | Freq: Four times a day (QID) | ORAL | Status: DC | PRN
Start: 2015-07-31 — End: 2016-02-27

## 2015-07-31 MED ORDER — ACETAMINOPHEN 325 MG PO TABS
650.0000 mg | ORAL_TABLET | Freq: Once | ORAL | Status: DC
Start: 2015-07-31 — End: 2015-07-31

## 2015-07-31 MED ORDER — POLYMYXIN B-TRIMETHOPRIM 10000-0.1 UNIT/ML-% OP SOLN
1.0000 [drp] | Freq: Four times a day (QID) | OPHTHALMIC | Status: AC
Start: 2015-07-31 — End: 2015-08-05

## 2015-07-31 NOTE — ED Provider Notes (Signed)
EMERGENCY DEPARTMENT HISTORY AND PHYSICAL EXAM     Physician/Midlevel provider first contact with patient: 07/31/15 0646         Date: 07/31/2015  Patient Name: Kent Jordan    History of Presenting Illness     Chief Complaint   Patient presents with   . Facial Swelling       History Provided By: Patient    Chief Complaint: Swelling under right eye  Onset: This morning  Timing: Constant  Location: under right eye  Quality: swollen  Severity: Moderate  Exacerbating factors: None  Alleviating factors: None  Associated Symptoms: right eye pain and redness, HA behind right eye, fatigue, productive cough, or sore throat   Pertinent Negatives: trouble swallowing, vision changes, fever, or vomiting    Additional History: Kent Jordan is a 64 y.o. male with PMHx of DM and b/l cataracts presents to the ED with swelling under right eye that began this morning. He also c/o right eye pain and redness, HA behind right eye, fatigue, productive cough, or sore throat. He describes his right eye pain as "scratchy." Pt states he thought he had a stye yesterday and applied warm compresses to his eye with some relief. He notes that he does not wear eye glasses. Pt reports that his DM has been well controlled recently. He states that he experienced angioedema 2 months ago from an ACE inhibitor he was prescribed and was treated in the ED. Pt denies trouble swallowing, vision changes, fever, or vomiting.    PCP: Roderic Palau, DO  SPECIALISTS: Dr. Marrion Coy, Ophthalmologist    Current Facility-Administered Medications   Medication Dose Route Frequency Provider Last Rate Last Dose   . acetaminophen (TYLENOL) tablet 650 mg  650 mg Oral Once Roxine Caddy, MD   650 mg at 07/31/15 7616     Current Outpatient Prescriptions   Medication Sig Dispense Refill   . adalimumab (HUMIRA) 40 MG/0.8ML injection Inject 40 mg into the skin every 14 (fourteen) days. Last injection 04/29/14       . aspirin 81 MG chewable tablet Chew 81 mg by mouth  daily.     . calcipotriene (DOVONOX) 0.005 % cream Apply topically 2 (two) times daily.     . ciclopirox (LOPROX) 0.77 % cream Apply topically 2 (two) times daily.     . clobetasol (TEMOVATE) 0.05 % cream Apply topically 2 (two) times daily.     Marland Kitchen co-enzyme Q-10 30 MG capsule Take 30 mg by mouth 3 (three) times daily.     . cyclobenzaprine (FLEXERIL) 10 MG tablet Take 10 mg by mouth 3 (three) times daily as needed for Muscle spasms.     . diazepam (VALIUM) 5 MG tablet Take 5 mg by mouth every 6 (six) hours as needed for Anxiety.     . DULoxetine (CYMBALTA) 60 MG capsule Take 60 mg by mouth daily.     . folic acid (FOLVITE) 400 MCG tablet Take 400 mcg by mouth daily.     Marland Kitchen glipiZIDE (GLUCOTROL) 10 MG tablet Take 10 mg by mouth 2 (two) times daily before meals.     . Lidocaine-Menthol 4-5 % Patch Apply topically.     . naproxen (NAPROSYN) 500 MG tablet Take 500 mg by mouth 2 (two) times daily with meals.     . rosuvastatin (CRESTOR) 5 MG tablet Take 5 mg by mouth daily.     . valsartan (DIOVAN) 80 MG tablet Take 80 mg by mouth daily.     Marland Kitchen  ZETIA 10 MG tablet Take 10 mg by mouth daily.        Marland Kitchen acetaminophen (TYLENOL) 325 MG tablet Take 2 tablets (650 mg total) by mouth every 6 (six) hours as needed for Pain or Fever. 30 tablet 0   . benazepril (LOTENSIN) 20 MG tablet Take 20 mg by mouth daily.        Marland Kitchen oxyCODONE-acetaminophen (PERCOCET) 5-325 MG per tablet Take 1 tablet by mouth every 4 (four) hours as needed for Pain (Take 1-2 tabs every 4-6 hrs as needed for pain). 100 tablet 0   . Tapentadol HCl (NUCYNTA ER) 200 MG Tablet SR 12 hr Take 1 tablet by mouth 2 (two) times daily. 150mg  2x daily       . trimethoprim-polymyxin b (POLYTRIM) ophthalmic solution Place 1 drop into the right eye every 6 (six) hours. 10 mL 0       Past History     Past Medical History:  Past Medical History   Diagnosis Date   . Back pain    . Hypertensive disorder    . High cholesterol    . Failed back syndrome    . Pneumonia 11/2009   .  Bilateral cataracts      removed 2009   . Sleep apnea      Does not use CPAP   . Neuropathy      Tingling left foot, numbness left toes   . Low back pain    . Difficulty in walking(719.7)      Uses cane to ambulate   . Elevated liver function tests    . 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 Psoriasis    . Arthritis      psoriatic and osteoarthritis        Past Surgical History:  Past Surgical History   Procedure Laterality Date   . Lumbar fusion  10/2011   . Microdiscectomy lumbar  11/12     patient had it done 10/12 11/12   . Knee arthrocentesis  1983     right   . Abdominal surgery  03/1971     double hernia repair   . Eye surgery  2009     cataract removal, both eyes    . Back surgery  9/12, 11/12, 2/13     + L-3 to S-1 fusion   . Fracture surgery  1993     right middle finger   . Insertion, spinal cord stimulator generator N/A 05/07/2014     Procedure: DORSAL COLUMN STIMULATOR PLACEMENT;  Surgeon: Tera Helper, MD;  Location: [facility];  Service: Neurosurgery;  Laterality: N/A;  DCS PLACEMENT       Family History:  Family History   Problem Relation Age of Onset   . Heart disease Mother    . Heart disease Father        Social History:  Social History   Substance Use Topics   . Smoking status: Former Smoker -- 1.00 packs/day for 41 years     Types: Cigarettes     Quit date: 09/26/2011   . Smokeless tobacco: Never Used   . Alcohol Use: 1.2 oz/week     2 Cans of beer per week       Allergies:  Allergies   Allergen Reactions   . Ciprofloxacin Swelling   . Gabapentin Other (See Comments)     blurred vision, dizziness       Review of Systems  Review of Systems   Constitutional: Positive for fatigue. Negative for fever.   HENT: Positive for sore throat. Negative for trouble swallowing.         (+) swelling under right eye   Eyes: Positive for pain and redness. Negative for visual disturbance.   Respiratory: Positive for cough.    Gastrointestinal: Negative for vomiting.   Neurological:  Positive for headaches.   All other systems reviewed and are negative.      Physical Exam   BP 159/78 mmHg  Pulse 61  Temp(Src) 98.3 F (36.8 C) (Oral)  Resp 18  Ht 5\' 11"  (1.803 m)  Wt 97.523 kg  BMI 30.00 kg/m2  SpO2 97%    Physical Exam   Constitutional: Patient is oriented to person, place, and time and well-developed, well-nourished, and in no distress.   Head: Normocephalic and atraumatic.   ENT/Eyes: EOM are normal. Pupils are 3 to 2 b/l and reactive to light. Erythema and edema right lower eyelid medially. Edema inferior and lateral to right eye. No purulence noted inferiorly. Upper right eyelid with mild edema. No conjunctival injection in right eye. MMM.   Neck: Normal range of motion. Neck supple.   Cardiovascular: Normal rate and regular rhythm.   Pulmonary/Chest: Effort normal and breath sounds normal. No respiratory distress.   Abdominal: Soft. There is no tenderness. No rebound or guarding.  Musculoskeletal: Normal range of motion. No lower extremity edema.  Neurological: Patient is alert and oriented to person, place, and time. No gross weakness.   Skin: Skin is warm and dry.       Diagnostic Study Results     Labs -     Results     ** No results found for the last 24 hours. **          Radiologic Studies -   Radiology Results (24 Hour)     ** No results found for the last 24 hours. **      .    Medical Decision Making   I am the first provider for this patient.    I reviewed the vital signs, available nursing notes, past medical history, past surgical history, family history and social history.    Vital Signs-Reviewed the patient's vital signs.     Patient Vitals for the past 12 hrs:   BP Temp Pulse Resp   07/31/15 0652 159/78 mmHg 98.3 F (36.8 C) 61 18       Pulse Oximetry Analysis - Normal 97% on ra      Old Medical Records: Old medical records. Nursing notes.     ED Course:   7:24 AM - Discussed plan to provide narcotic pain medication. Pt declined narcotic pain medication at this time.  Discussed plan to administer Tylenol and eye abx ointment. Advised pt to follow up with ophthalmology. Advised pt to continue to apply hot compresses to eye as tolerated. Solicited and answered pt's questions. Pt is agreeable to plan and ready for discharge.    Provider Notes: 33M presented with R lower lid edema and redness. C/w stye. Vision at baseline. Rx'd polytrim. Ophtho referral. Improved on re-evaluation. Discussed importance of f/u and indications for return.         Diagnosis     Clinical Impression:   1. Hordeolum externum of right lower eyelid        Treatment Plan:   ED Disposition     Discharge Loyd Marhefka Weirauch discharge to home/self care.    Condition  at disposition: Stable              _______________________________      Attestations: This note is prepared by Sallee Provencal, acting as scribe for Burgess Estelle, MD. The scribe's documentation has been prepared under my direction and personally reviewed by me in its entirety.  I confirm that the note above accurately reflects all work, treatment, procedures, and medical decision making performed by me.    _______________________________    Roxine Caddy, MD  07/31/15 [phone]

## 2015-07-31 NOTE — Discharge Instructions (Signed)
Dear Kent Jordan:     Thank you for choosing the [facility] for your healthcare needs. It was a privilege caring for you. We are genuinely concerned about your health and comfort. I hope your visit today was EXCELLENT.     Below is some information our patients often find helpful.    Instructions:     We recommend taking Tylenol as needed for discomfort.      It is critical that you return immediately or follow up with your doctor if your symptoms worsen, including:  You have worsening pain.  You are unable to keep food down.  You have fevers greater than 101.4 degrees.  You have any concerns.    We recommend checking GoodRx.com to find coupons to lower the cost of your prescriptions.    You will likely be receiving a survey about the care you received in the emergency department. It would mean a lot to our care team if you could fill that out and return the form. We're always looking to improve and your feedback will help [place] do that.    We wish you good health. Please do not hesitate to contact me if I can be of assistance. We aim to provide VERY GOOD care.     Sincerely,   Cloyd Stagers. Meryl Crutch, MD FACEP  [facility]  [phone]        Hordeolum, Stye    You have been diagnosed with a stye. The medical term for a stye is a "hordeolum."    A hordeolum, or "stye," is an infection of the eyelid. It causes a small pocket of pus called a pustule to form.    Symptoms of a style include swelling, redness and pain in the eyelid, along with the feeling of having a lump within the eyelid.    Styes are uncomfortable but rarely serious. They usually go away with simple treatment. To treat a stye, put on warm compresses for 15 minutes several times a day. Put warm tap water onto a wash cloth. Then, gently place this over your eyes. Topical (surface) antibiotic ointment is sometimes used. Rarely, a large stye may need to be cut into and drained.    YOU SHOULD SEEK MEDICAL  ATTENTION IMMEDIATELY, EITHER HERE OR AT THE NEAREST EMERGENCY DEPARTMENT, IF ANY OF THE FOLLOWING OCCURS:   Eyelid redness or swelling gets worse.   Swelling of the whole area around the eye.   Pain when moving the eyes up and down or back and forth.   Fever (temperature higher than 100.55F / 38C).

## 2015-09-12 ENCOUNTER — Ambulatory Visit
Admission: RE | Admit: 2015-09-12 | Discharge: 2015-09-12 | Disposition: A | Discharge status: Home / Self Care | Payer: BLUE CROSS/BLUE SHIELD | Source: Ambulatory Visit | Attending: Internal Medicine | Admitting: Internal Medicine

## 2015-09-12 DIAGNOSIS — E875 Hyperkalemia: Secondary | ICD-10-CM | POA: Insufficient documentation

## 2015-09-12 LAB — COMPREHENSIVE METABOLIC PANEL
ALT: 84 U/L — ABNORMAL HIGH (ref 0–55)
AST (SGOT): 43 U/L — ABNORMAL HIGH (ref 5–34)
Albumin/Globulin Ratio: 1.4 (ref 0.9–2.2)
Albumin: 3.8 g/dL (ref 3.5–5.0)
Alkaline Phosphatase: 100 U/L (ref 38–106)
BUN: 19 mg/dL (ref 9.0–28.0)
Bilirubin, Total: 0.6 mg/dL (ref 0.1–1.2)
CO2: 25 mEq/L (ref 21–30)
Calcium: 9.4 mg/dL (ref 8.5–10.5)
Chloride: 103 mEq/L (ref 100–111)
Creatinine: 1.1 mg/dL (ref 0.5–1.5)
Globulin: 2.8 g/dL (ref 2.0–3.7)
Glucose: 170 mg/dL — ABNORMAL HIGH (ref 70–100)
Potassium: 4.1 mEq/L (ref 3.5–5.3)
Protein, Total: 6.6 g/dL (ref 6.0–8.3)
Sodium: 138 mEq/L (ref 135–146)

## 2015-09-12 LAB — GFR: EGFR: >60

## 2015-09-12 LAB — HEMOLYSIS INDEX: Hemolysis Index: 16 (ref 0–18)

## 2015-10-05 ENCOUNTER — Inpatient Hospital Stay
Admission: EM | Admit: 2015-10-05 | Discharge: 2015-10-06 | DRG: 308 | Disposition: A | Discharge status: Home / Self Care | Payer: BLUE CROSS/BLUE SHIELD | Attending: Critical Care Medicine | Admitting: Critical Care Medicine

## 2015-10-05 ENCOUNTER — Inpatient Hospital Stay: Payer: BLUE CROSS/BLUE SHIELD | Admitting: Critical Care Medicine

## 2015-10-05 ENCOUNTER — Inpatient Hospital Stay: Payer: BLUE CROSS/BLUE SHIELD

## 2015-10-05 DIAGNOSIS — E7800 Pure hypercholesterolemia, unspecified: Secondary | ICD-10-CM | POA: Diagnosis present

## 2015-10-05 DIAGNOSIS — G8929 Other chronic pain: Secondary | ICD-10-CM | POA: Diagnosis present

## 2015-10-05 DIAGNOSIS — N179 Acute kidney failure, unspecified: Secondary | ICD-10-CM | POA: Diagnosis present

## 2015-10-05 DIAGNOSIS — I9581 Postprocedural hypotension: Secondary | ICD-10-CM | POA: Diagnosis present

## 2015-10-05 DIAGNOSIS — Z9889 Other specified postprocedural states: Secondary | ICD-10-CM

## 2015-10-05 DIAGNOSIS — G629 Polyneuropathy, unspecified: Secondary | ICD-10-CM | POA: Diagnosis present

## 2015-10-05 DIAGNOSIS — E875 Hyperkalemia: Secondary | ICD-10-CM | POA: Diagnosis present

## 2015-10-05 DIAGNOSIS — E119 Type 2 diabetes mellitus without complications: Secondary | ICD-10-CM | POA: Diagnosis present

## 2015-10-05 DIAGNOSIS — M545 Low back pain: Secondary | ICD-10-CM | POA: Diagnosis present

## 2015-10-05 DIAGNOSIS — Z981 Arthrodesis status: Secondary | ICD-10-CM

## 2015-10-05 DIAGNOSIS — R918 Other nonspecific abnormal finding of lung field: Secondary | ICD-10-CM

## 2015-10-05 DIAGNOSIS — Z8701 Personal history of pneumonia (recurrent): Secondary | ICD-10-CM

## 2015-10-05 DIAGNOSIS — I499 Cardiac arrhythmia, unspecified: Secondary | ICD-10-CM | POA: Diagnosis present

## 2015-10-05 DIAGNOSIS — N289 Disorder of kidney and ureter, unspecified: Secondary | ICD-10-CM

## 2015-10-05 DIAGNOSIS — I472 Ventricular tachycardia: Principal | ICD-10-CM | POA: Diagnosis present

## 2015-10-05 DIAGNOSIS — I959 Hypotension, unspecified: Secondary | ICD-10-CM

## 2015-10-05 DIAGNOSIS — Z881 Allergy status to other antibiotic agents status: Secondary | ICD-10-CM

## 2015-10-05 DIAGNOSIS — L409 Psoriasis, unspecified: Secondary | ICD-10-CM | POA: Diagnosis present

## 2015-10-05 DIAGNOSIS — Z87891 Personal history of nicotine dependence: Secondary | ICD-10-CM

## 2015-10-05 DIAGNOSIS — E785 Hyperlipidemia, unspecified: Secondary | ICD-10-CM | POA: Diagnosis present

## 2015-10-05 DIAGNOSIS — D72829 Elevated white blood cell count, unspecified: Secondary | ICD-10-CM | POA: Diagnosis present

## 2015-10-05 DIAGNOSIS — J189 Pneumonia, unspecified organism: Secondary | ICD-10-CM | POA: Diagnosis present

## 2015-10-05 DIAGNOSIS — E860 Dehydration: Secondary | ICD-10-CM | POA: Diagnosis present

## 2015-10-05 DIAGNOSIS — I10 Essential (primary) hypertension: Secondary | ICD-10-CM | POA: Diagnosis present

## 2015-10-05 DIAGNOSIS — I498 Other specified cardiac arrhythmias: Secondary | ICD-10-CM | POA: Diagnosis present

## 2015-10-05 DIAGNOSIS — Z888 Allergy status to other drugs, medicaments and biological substances status: Secondary | ICD-10-CM

## 2015-10-05 DIAGNOSIS — M1990 Unspecified osteoarthritis, unspecified site: Secondary | ICD-10-CM | POA: Diagnosis present

## 2015-10-05 DIAGNOSIS — J9811 Atelectasis: Secondary | ICD-10-CM | POA: Diagnosis present

## 2015-10-05 DIAGNOSIS — G4730 Sleep apnea, unspecified: Secondary | ICD-10-CM | POA: Diagnosis present

## 2015-10-05 DIAGNOSIS — Z7982 Long term (current) use of aspirin: Secondary | ICD-10-CM

## 2015-10-05 HISTORY — PX: SEPTOPLASTY: SUR1290

## 2015-10-05 LAB — COMPREHENSIVE METABOLIC PANEL
ALT: 93 U/L — ABNORMAL HIGH (ref 0–55)
AST (SGOT): 58 U/L — ABNORMAL HIGH (ref 5–34)
Albumin/Globulin Ratio: 1.5 (ref 0.9–2.2)
Albumin: 3.5 g/dL (ref 3.5–5.0)
Alkaline Phosphatase: 94 U/L (ref 38–106)
Anion Gap: 9 (ref 5.0–15.0)
BUN: 23 mg/dL (ref 9.0–28.0)
Bilirubin, Total: 0.3 mg/dL (ref 0.2–1.2)
CO2: 20 mEq/L — ABNORMAL LOW (ref 22–29)
Calcium: 8.4 mg/dL — ABNORMAL LOW (ref 8.5–10.5)
Chloride: 109 mEq/L (ref 100–111)
Creatinine: 1.4 mg/dL — ABNORMAL HIGH (ref 0.7–1.3)
Globulin: 2.3 g/dL (ref 2.0–3.6)
Glucose: 265 mg/dL — ABNORMAL HIGH (ref 70–100)
Potassium: 6.4 mEq/L (ref 3.5–5.1)
Protein, Total: 5.8 g/dL — ABNORMAL LOW (ref 6.0–8.3)
Sodium: 138 mEq/L (ref 136–145)

## 2015-10-05 LAB — CBC AND DIFFERENTIAL

## 2015-10-05 LAB — BASIC METABOLIC PANEL
Anion Gap: 9 (ref 5.0–15.0)
BUN: 22 mg/dL (ref 9.0–28.0)
CO2: 20 mEq/L — ABNORMAL LOW (ref 22–29)
Calcium: 9.4 mg/dL (ref 8.5–10.5)
Chloride: 111 mEq/L (ref 100–111)
Creatinine: 1.5 mg/dL — ABNORMAL HIGH (ref 0.7–1.3)
Glucose: 298 mg/dL — ABNORMAL HIGH (ref 70–100)
Potassium: 4.2 mEq/L (ref 3.5–5.1)
Sodium: 140 mEq/L (ref 136–145)

## 2015-10-05 LAB — TROPONIN I: Troponin I: 0.04 ng/mL (ref 0.00–0.09)

## 2015-10-05 LAB — CBC
Hematocrit: 43.9 % (ref 42.0–52.0)
Hgb: 14.5 g/dL (ref 13.0–17.0)
MCH: 32.1 pg — ABNORMAL HIGH (ref 28.0–32.0)
MCHC: 33 g/dL (ref 32.0–36.0)
MCV: 97.1 fL (ref 80.0–100.0)
MPV: 9.5 fL (ref 9.4–12.3)
Nucleated RBC: 0 /100 WBC (ref 0–1)
Platelets: 216 10*3/uL (ref 140–400)
RBC: 4.52 10*6/uL — ABNORMAL LOW (ref 4.70–6.00)
RDW: 13 % (ref 12–15)
WBC: 14.03 10*3/uL — ABNORMAL HIGH (ref 3.50–10.80)

## 2015-10-05 LAB — GFR
EGFR: 51.3
EGFR: 47.4

## 2015-10-05 LAB — GLUCOSE WHOLE BLOOD - POCT
Whole Blood Glucose POCT: 178 mg/dL — ABNORMAL HIGH (ref 70–100)
Whole Blood Glucose POCT: 157 mg/dL — ABNORMAL HIGH (ref 70–100)

## 2015-10-05 LAB — IHS D-DIMER: D-Dimer: 0.56 ug/mL FEU — ABNORMAL HIGH (ref 0.00–0.51)

## 2015-10-05 LAB — B-TYPE NATRIURETIC PEPTIDE: B-Natriuretic Peptide: 17 pg/mL (ref 0–100)

## 2015-10-05 MED ORDER — INSULIN ASPART 100 UNIT/ML SC SOLN
1.0000 [IU] | Freq: Three times a day (TID) | SUBCUTANEOUS | Status: DC | PRN
Start: 2015-10-05 — End: 2015-10-06

## 2015-10-05 MED ORDER — OXYCODONE-ACETAMINOPHEN 5-325 MG PO TABS
2.0000 | ORAL_TABLET | ORAL | Status: AC | PRN
Start: 2015-10-05 — End: 2015-10-05
  Administered 2015-10-05: 2 via ORAL
  Filled 2015-10-05: qty 2

## 2015-10-05 MED ORDER — INSULIN REGULAR HUMAN 100 UNIT/ML IJ SOLN
10.0000 [IU] | Freq: Once | INTRAMUSCULAR | Status: AC
Start: 2015-10-05 — End: 2015-10-05
  Administered 2015-10-05: 10 [IU] via INTRAVENOUS
  Filled 2015-10-05: qty 1

## 2015-10-05 MED ORDER — DULOXETINE HCL 60 MG PO CPEP
60.0000 mg | ORAL_CAPSULE | Freq: Every day | ORAL | Status: DC
Start: 2015-10-05 — End: 2015-10-06
  Administered 2015-10-05 – 2015-10-06 (×2): 60 mg via ORAL
  Filled 2015-10-05 (×2): qty 1

## 2015-10-05 MED ORDER — ACETAMINOPHEN 325 MG PO TABS
650.0000 mg | ORAL_TABLET | Freq: Four times a day (QID) | ORAL | Status: DC | PRN
Start: 2015-10-05 — End: 2015-10-06
  Administered 2015-10-05: 650 mg via ORAL
  Filled 2015-10-05: qty 2

## 2015-10-05 MED ORDER — MORPHINE SULFATE 2 MG/ML IJ/IV SOLN (WRAP)
2.0000 mg | Freq: Four times a day (QID) | Status: DC | PRN
Start: 2015-10-05 — End: 2015-10-06
  Administered 2015-10-05 – 2015-10-06 (×2): 2 mg via INTRAVENOUS
  Filled 2015-10-05 (×2): qty 1

## 2015-10-05 MED ORDER — EZETIMIBE 10 MG PO TABS
10.0000 mg | ORAL_TABLET | Freq: Every day | ORAL | Status: DC
Start: 2015-10-05 — End: 2015-10-06
  Administered 2015-10-05 – 2015-10-06 (×2): 10 mg via ORAL
  Filled 2015-10-05 (×2): qty 1

## 2015-10-05 MED ORDER — OXYCODONE-ACETAMINOPHEN 5-325 MG PO TABS
2.0000 | ORAL_TABLET | ORAL | Status: DC | PRN
Start: 2015-10-05 — End: 2015-10-06
  Administered 2015-10-05 – 2015-10-06 (×3): 2 via ORAL
  Filled 2015-10-05 (×3): qty 2

## 2015-10-05 MED ORDER — INSULIN ASPART 100 UNIT/ML SC SOLN
1.0000 [IU] | Freq: Every evening | SUBCUTANEOUS | Status: DC | PRN
Start: 2015-10-05 — End: 2015-10-06

## 2015-10-05 MED ORDER — ASPIRIN 81 MG PO CHEW
81.0000 mg | CHEWABLE_TABLET | Freq: Every day | ORAL | Status: DC
Start: 2015-10-05 — End: 2015-10-06

## 2015-10-05 MED ORDER — DEXTROSE 50 % IV SOLN
25.0000 mL | INTRAVENOUS | Status: DC | PRN
Start: 2015-10-05 — End: 2015-10-06

## 2015-10-05 MED ORDER — CALCIUM CHLORIDE 10 % IV SOLN
1.0000 g | Freq: Once | INTRAVENOUS | Status: AC
Start: 2015-10-05 — End: 2015-10-05
  Administered 2015-10-05: 1 g via INTRAVENOUS
  Filled 2015-10-05: qty 10

## 2015-10-05 MED ORDER — SODIUM CHLORIDE 0.9 % IV BOLUS
1000.0000 mL | Freq: Once | INTRAVENOUS | Status: AC
Start: 2015-10-05 — End: 2015-10-05
  Administered 2015-10-05: 1000 mL via INTRAVENOUS

## 2015-10-05 MED ORDER — ROSUVASTATIN CALCIUM 10 MG PO TABS
5.0000 mg | ORAL_TABLET | Freq: Every day | ORAL | Status: DC
Start: 2015-10-05 — End: 2015-10-06
  Administered 2015-10-05 – 2015-10-06 (×2): 5 mg via ORAL
  Filled 2015-10-05 (×2): qty 1

## 2015-10-05 MED ORDER — DEXTROSE 50 % IV SOLN
25.0000 g | Freq: Once | INTRAVENOUS | Status: AC
Start: 2015-10-05 — End: 2015-10-05
  Administered 2015-10-05: 25 g via INTRAVENOUS
  Filled 2015-10-05: qty 50

## 2015-10-05 MED ORDER — GLUCAGON 1 MG IJ SOLR (WRAP)
1.0000 mg | INTRAMUSCULAR | Status: DC | PRN
Start: 2015-10-05 — End: 2015-10-06

## 2015-10-05 NOTE — ED Notes (Signed)
Nurse Myriam Jacobson, at [facility], notified of pt's potassium results and treatments given. RN verbalized understanding.

## 2015-10-05 NOTE — ED Provider Notes (Signed)
EMERGENCY DEPARTMENT HISTORY AND PHYSICAL EXAM     Physician/Midlevel provider first contact with patient: 10/05/15 1123         Date: 10/05/2015  Patient Name: Kent Jordan    History of Presenting Illness     Chief Complaint   Patient presents with   . Irregular Heart Beat       History Provided By: Pt and pt's wife    Chief Complaint: Irregular heart rhythm  Onset: Just PTA  Timing: Still present  Location: Cardiovascular  Severity: Moderate  Exacerbating Factors: None   Alleviating Factors: None  Associated Symptoms: None   Pertinent Negatives: Abdominal pain, chest pain    Additional History: Kent Jordan is a 65 y.o. male with a hx of DM and HTN presenting to the ED with an irregular heart rhythm. [facility] and while in recovery, his HR went into a "irregular rhythm" and was sent downstairs to the ED for evaluation for r/o MI. He received 1 mg Morphine, 50 mg Ephedrine, and had nitropaste placed on his left chest. Pt is still waking up from anesthesia, but is able to answer questions. Denies abdominal pain or chest pain.    PCP: Roderic Palau, DO    Current Facility-Administered Medications   Medication Dose Route Frequency Provider Last Rate Last Dose   . calcium chloride 10 % injection 1 g  1 g Intravenous Once Laren Boom, MD       . dextrose 50 % bolus 25 g  25 g Intravenous Once Laren Boom, MD       . insulin regular (HumuLIN R,NovoLIN R) injection 10 Units  10 Units Intravenous Once Laren Boom, MD   10 Units at 10/05/15 1352     Current Outpatient Prescriptions   Medication Sig Dispense Refill   . acetaminophen (TYLENOL) 325 MG tablet Take 2 tablets (650 mg total) by mouth every 6 (six) hours as needed for Pain or Fever. 30 tablet 0   . adalimumab (HUMIRA) 40 MG/0.8ML injection Inject 40 mg into the skin every 14 (fourteen) days. Last injection 04/29/14       . aspirin 81 MG chewable tablet Chew 81 mg by mouth daily.     . benazepril  (LOTENSIN) 20 MG tablet Take 20 mg by mouth daily.        . calcipotriene (DOVONOX) 0.005 % cream Apply topically 2 (two) times daily.     . ciclopirox (LOPROX) 0.77 % cream Apply topically 2 (two) times daily.     . clobetasol (TEMOVATE) 0.05 % cream Apply topically 2 (two) times daily.     Marland Kitchen co-enzyme Q-10 30 MG capsule Take 30 mg by mouth 3 (three) times daily.     . cyclobenzaprine (FLEXERIL) 10 MG tablet Take 10 mg by mouth 3 (three) times daily as needed for Muscle spasms.     . diazepam (VALIUM) 5 MG tablet Take 5 mg by mouth every 6 (six) hours as needed for Anxiety.     . DULoxetine (CYMBALTA) 60 MG capsule Take 60 mg by mouth daily.     . folic acid (FOLVITE) 400 MCG tablet Take 400 mcg by mouth daily.     Marland Kitchen glipiZIDE (GLUCOTROL) 10 MG tablet Take 10 mg by mouth 2 (two) times daily before meals.     . Lidocaine-Menthol 4-5 % Patch Apply topically.     . naproxen (NAPROSYN) 500 MG tablet Take 500 mg  by mouth 2 (two) times daily with meals.     Marland Kitchen oxyCODONE-acetaminophen (PERCOCET) 5-325 MG per tablet Take 1 tablet by mouth every 4 (four) hours as needed for Pain (Take 1-2 tabs every 4-6 hrs as needed for pain). 100 tablet 0   . rosuvastatin (CRESTOR) 5 MG tablet Take 5 mg by mouth daily.     . Tapentadol HCl (NUCYNTA ER) 200 MG Tablet SR 12 hr Take 1 tablet by mouth 2 (two) times daily. 150mg  2x daily       . valsartan (DIOVAN) 80 MG tablet Take 80 mg by mouth daily.     Marland Kitchen ZETIA 10 MG tablet Take 10 mg by mouth daily.            Past History     Past Medical History:  Past Medical History   Diagnosis Date   . Back pain    . Hypertensive disorder    . High cholesterol    . Failed back syndrome    . Pneumonia 11/2009   . Bilateral cataracts      removed 2009   . Sleep apnea      Does not use CPAP   . Neuropathy      Tingling left foot, numbness left toes   . Low back pain    . Difficulty in walking(719.7)      Uses cane to ambulate   . Elevated liver function tests    . 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  Psoriasis    . Arthritis      psoriatic and osteoarthritis        Past Surgical History:  Past Surgical History   Procedure Laterality Date   . Lumbar fusion  10/2011   . Microdiscectomy lumbar  11/12     patient had it done 10/12 11/12   . Knee arthrocentesis  1983     right   . Abdominal surgery  03/1971     double hernia repair   . Eye surgery  2009     cataract removal, both eyes    . Back surgery  9/12, 11/12, 2/13     + L-3 to S-1 fusion   . Fracture surgery  1993     right middle finger   . Insertion, spinal cord stimulator generator N/A 05/07/2014     Procedure: DORSAL COLUMN STIMULATOR PLACEMENT;  Surgeon: Tera Helper, MD;  Location: [facility];  Service: Neurosurgery;  Laterality: N/A;  DCS PLACEMENT   . Septoplasty  10/05/2015       Family History:  Family History   Problem Relation Age of Onset   . Heart disease Mother    . Heart disease Father        Social History:  Social History   Substance Use Topics   . Smoking status: Former Smoker -- 1.00 packs/day for 41 years     Types: Cigarettes     Quit date: 09/26/2011   . Smokeless tobacco: Never Used   . Alcohol Use: 1.2 oz/week     2 Cans of beer per week       Allergies:  Allergies   Allergen Reactions   . Ciprofloxacin Swelling   . Ace Inhibitors Angioedema   . Gabapentin Other (See Comments)     blurred vision, dizziness       Review of Systems     Review of Systems   Constitutional: Negative for fever.  HENT: Negative for nosebleeds.    Eyes: Negative for redness.   Respiratory: Negative for cough.    Cardiovascular: Negative for chest pain.   Gastrointestinal: Negative for abdominal pain.   Musculoskeletal:        (+) Irregular heart rhythm   Neurological: Negative for loss of consciousness.   Endo/Heme/Allergies:        Allergic to Ciprofloxacin, Ace inhibitors, and Gabapentin   Psychiatric/Behavioral: Negative for suicidal ideas.       Physical Exam   BP 99/50 mmHg  Pulse 64  Temp(Src) 97.9 F (36.6 C) (Tympanic)   Resp 24  Ht 5\' 11"  (1.803 m)  Wt 97 kg  BMI 29.84 kg/m2  SpO2 96%    Physical Exam   Constitutional: Patient is oriented to person, place, and time and well-developed, well-nourished, and in no distress. Awake, feels groggy  Head: Normocephalic and atraumatic.   Eyes: EOM are normal. Pupils are equal, round, and reactive to light.   ENT: nares: nl mucosa, no discharge, has bandage to nose ears: nl external canals, TMs, NLRB, Pharynx: tonsils nl, uvula midline, no lesions  Neck: Normal range of motion. Neck supple. No anterior cervical LNs.  Cardiovascular: Bigeminal rhythm  Pulmonary/Chest: Effort normal and breath sounds normal. No respiratory distress.   Abdominal: Soft. There is no tenderness. Bowel sounds present and normal.No CVA tenderness  Musculoskeletal: Normal range of motion.   Neurological: Patient is alert and oriented to person, place, and time. GCS score is 15. No sensory deficits, No motor weakness, no facial weakness, no dysarthria, No upper extremity drift, DTRs 2+,   Skin: Skin is warm and dry.     Diagnostic Study Results     Labs -     Results     Procedure Component Value Units Date/Time    D-Dimer [[id number]] Collected:  10/05/15 1255     Updated:  10/05/15 1348    B-type Natriuretic Peptide [[id number]] Collected:  10/05/15 1259    Specimen Information:  Blood Updated:  10/05/15 1344    Comprehensive Metabolic Panel (CMP) [[id number]]  (Abnormal) Collected:  10/05/15 1259    Specimen Information:  Blood Updated:  10/05/15 1343     Glucose 265 (H) mg/dL      BUN 08.6 mg/dL      Creatinine 1.4 (H) mg/dL      Sodium 578 mEq/L      Potassium 6.4 (HH) mEq/L      Chloride 109 mEq/L      CO2 20 (L) mEq/L      Calcium 8.4 (L) mg/dL      Protein, Total 5.8 (L) g/dL      Albumin 3.5 g/dL      AST (SGOT) 58 (H) U/L      ALT 93 (H) U/L      Alkaline Phosphatase 94 U/L      Bilirubin, Total 0.3 mg/dL      Globulin 2.3 g/dL      Albumin/Globulin Ratio 1.5      Anion Gap 9.0     GFR [[id number]] Collected:   10/05/15 1259     EGFR 51.3 Updated:  10/05/15 1343    Troponin I [[id number]] Collected:  10/05/15 1259    Specimen Information:  Blood Updated:  10/05/15 1336     Troponin I 0.04 ng/mL     CBC and differential [[id number]] Collected:  10/05/15 1151    Specimen Information:  Blood from Blood Updated:  10/05/15 1311     WBC  see note x10 3/uL      Hgb see note g/dL      Hematocrit see note %      Platelets see note x10 3/uL      RBC see note x10 6/uL      MCV see note fL      MCH see note pg      MCHC see note g/dL      RDW see note %      MPV see note fL      Neutrophils see note %      Lymphocytes Automated see note %      Monocytes see note %      Eosinophils Automated see note %      Basophils Automated see note %      Immature Granulocyte see note %      Nucleated RBC see note /100 WBC      Neutrophils Absolute see note x10 3/uL      Abs Lymph Automated see note x10 3/uL      Abs Mono Automated see note x10 3/uL      Abs Eos Automated see note x10 3/uL      Absolute Baso Automated see note x10 3/uL      Absolute Immature Granulocyte see note x10 3/uL     Troponin I [[id number]] Collected:  10/05/15 1150    Specimen Information:  Blood Updated:  10/05/15 1253     Troponin I see note ng/mL           Radiologic Studies -   Radiology Results (24 Hour)     Procedure Component Value Units Date/Time    Chest AP Portable [[id number]] Collected:  10/05/15 1239    Order Status:  Completed Updated:  10/05/15 1243    Narrative:      History: chest pain protocol    Technique: Single Portable View    Comparison: 04/27/2014    Findings:  Is mild patchy interstitial scarring at the left base and mild patchy  new interstitial lung disease at the right  There is no pneumothorax.  The heart is normal in size.    The mediastinum is within normal limits.             Impression:       New patchy right basilar airspace disease which  may represent atelectasis or pneumonia    Laurena Slimmer, MD   10/05/2015 12:39 PM        .      Medical  Decision Making   I am the first provider for this patient.    I reviewed the vital signs, available nursing notes, past medical history, past surgical history, family history and social history.    Vital Signs-Reviewed the patient's vital signs.     Patient Vitals for the past 12 hrs:   BP Temp Pulse Resp   10/05/15 1306 99/50 mmHg - 64 (!) 24   10/05/15 1227 106/55 mmHg - 65 16   10/05/15 1218 - 97.9 F (36.6 C) - -   10/05/15 1212 98/51 mmHg - 60 15   10/05/15 1122 108/59 mmHg - 65 13       Pulse Oximetry Analysis - Normal, 98% on RA    Old Medical Records: Nursing notes.     Cardiac Monitor:   Rate: 73   Rhythm: Junctional bigeminy  EKG:  Interpreted by the Emergency Physician.   Time Interpreted: 1120   Rate: 74  Rhythm: Junctional bigeminy with occasional sinus beats.   Interpretation: Widened QRS. ASMIAU   Comparison: Compared to pre-op EKG- sinus rhythm no longer present    EKG:  Interpreted by the Emergency Physician.   Time Interpreted: 1307   Rate: 64   Rhythm: Sinus Rhythm with PACs and junctional beats   Interpretation:ASAIMU   Comparison: Significantly less bigeminal rhythms and QRS improved compared to prior ED EKG      EKG:  Interpreted by the Emergency Physician.   Time Interpreted: 1351   Rate: 67   Rhythm: Sinus Rhythm with a PAC   Interpretation: ASAIMU   Comparison: Bigeminy resolved from prior    Admit Decision Time:  The decision to admit this patient was made by the emergency provider at 11:56 AM on 10/05/2015     ED Course:     11:33 AM - Pt agreeable with ED plan, including having lab work performed. Pt and wife agrees to have pt hospitalized at [facility].    11:37 AM - Cardiology paged.    11:46 AM - Spoke to Dr. Jeannett Senior day. Says admit to ICU.     11:53 AM - Spoke to Dr. Jeryl Columbia, on-call ICU. Will consult. Paged Dr. Wilhelmina Mcardle.    12:17 PM - No call back from Dr. Hoy Finlay. Spoke to [employer], NP for [employer]. Accepts pt on behalf of Dr. Wynelle Bourgeois.    12:29 PM - Dr. Berneice Heinrich Silis called. He  wants to take pt. Katie from [facility] is aware. Dr. Wilhelmina Mcardle is accepting physician.    1:28 PM - Updated Dr. Jeryl Columbia that lab here requested lab redraw so no official lab work have come back yet. Updated him on x-ray results and second EKG interpretation.    1:44 PM - Due to delay in lab work, results have just resulted, showing hyperkalemia and acute renal failure. Will have to treat pt before sending to [facility] ICU.    1:46 PM - Updated Dr. Jeryl Columbia on lab work and aware of lab work.    Provider Notes:    Critical Care Time:     CRITICAL CARE: The high probability of sudden, clinically significant deterioration in the patient's condition required the highest level of my preparedness to intervene urgently.    The services I provided to this patient were to treat and/or prevent clinically significant deterioration that could result in: Death.  Services included the following: chart data review, reviewing nursing notes and/or old charts, documentation time, consultant collaboration regarding findings and treatment options, medication orders and management, direct patient care, re-evaluations, vital sign assessments and ordering, interpreting and reviewing diagnostic studies/lab tests.    Aggregate critical care time was 100 minutes, which includes only time during which I was engaged in work directly related to the patient's care, as described above, whether at the bedside or elsewhere in the Emergency Department.  It did not include time spent performing other reported procedures or the services of residents, students, nurses or physician assistants.        Diagnosis     Clinical Impression:   1. Bigeminal rhythm    2. Status post operation on nasal septum    3. Atelectasis    4. Postprocedural hypotension    5. Hyperkalemia    6. Acute renal failure, unspecified acute renal failure type        Treatment Plan:   ED Disposition     Admit Admitting Physician: Lattie Haw [[id number]]  Diagnosis: Bigeminal rhythm [208304]  Estimated  Length of  Stay: > or = to 2 midnights  Tentative Discharge Plan?: Home or Self Care [1]  Patient Class: Inpatient [101]          _______________________________    Attestations:   This note is prepared by Marcille Blanco, acting as Scribe for Docia Chuck, MD.    Docia Chuck, MD:  The scribe's documentation has been prepared under my direction and personally reviewed by me in its entirety.  I confirm that the note above accurately reflects all work, treatment, procedures, and medical decision making performed by me.    _______________________________              Laren Boom, MD  10/05/15 1409

## 2015-10-05 NOTE — ED Notes (Signed)
Bed: M03  Expected date: 10/05/15  Expected time: 10:58 AM  Means of arrival: Other [facility])  Comments:

## 2015-10-05 NOTE — H&P (Signed)
CRITICAL CARE ADMISSION    Date Time: 10/05/2015 3:31 PM  Patient Name: Kent Jordan  Attending Physician: Lattie Haw, MD    ICU Problem List: Hyperkalemia, cardiac arrhythmia, hypertension, chronic back pain, hyperlipidemia    Assessment:     . Hyperkalemia.  . Cardiac arrhythmia.  . Hypertension  . Acute renal  . Chronic back pain.  . Status post septoplasty.  . Hyperlipidemia    Plan:     Marland Kitchen Hyperkalemia, bicarbonate and insulin were given in the emergency room, I will check potassium again.  . Cardiac arrhythmia, most likely related to hyperkalemia.  Cardiology consultation has been obtained.  . Hold losartan because of acute renal failure, will restart Zetia.  . Acute renal failure, dehydration.  Continue IV fluid and follow electrolytes  . Pain medications.  . Continue wound care.  Marland Kitchen Restart statin.    Discussed with patient in detail, explained clinical condition and treatment plan.  Discussed with Dr. Jeannett Senior Day, cardiology  Discussed with bedside nurse    History of Present Illness:   Kent Jordan is a 65 y.o. male who underwent septoplasty today.  [facility].  Postoperatively, patient developed some arrhythmia.  He was transferred to emergency room then admitted to ICU.  Patient was found to be hyperkalemic at present, he is awake and comfortable.      Past Medical History:     Past Medical History   Diagnosis Date   . Back pain    . Hypertensive disorder    . High cholesterol    . Failed back syndrome    . Pneumonia 11/2009   . Bilateral cataracts      removed 2009   . Sleep apnea      Does not use CPAP   . Neuropathy      Tingling left foot, numbness left toes   . Low back pain    . Difficulty in walking(719.7)      Uses cane to ambulate   . Elevated liver function tests    . 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 Psoriasis    . Arthritis      psoriatic and osteoarthritis        Past Surgical History:     Past Surgical History   Procedure Laterality Date   . Lumbar fusion  10/2011   .  Microdiscectomy lumbar  11/12     patient had it done 10/12 11/12   . Knee arthrocentesis  1983     right   . Abdominal surgery  03/1971     double hernia repair   . Eye surgery  2009     cataract removal, both eyes    . Back surgery  9/12, 11/12, 2/13     + L-3 to S-1 fusion   . Fracture surgery  1993     right middle finger   . Insertion, spinal cord stimulator generator N/A 05/07/2014     Procedure: DORSAL COLUMN STIMULATOR PLACEMENT;  Surgeon: Tera Helper, MD;  Location: [facility];  Service: Neurosurgery;  Laterality: N/A;  DCS PLACEMENT   . Septoplasty  10/05/2015       Family History:     Family History   Problem Relation Age of Onset   . Heart disease Mother    . Heart disease Father        Social History:     Social History     Social History   .  Marital Status: Single     Spouse Name: N/A   . Number of Children: N/A   . Years of Education: N/A     Social History Main Topics   . Smoking status: Former Smoker -- 1.00 packs/day for 41 years     Types: Cigarettes     Quit date: 09/26/2011   . Smokeless tobacco: Never Used   . Alcohol Use: 1.2 oz/week     2 Cans of beer per week   . Drug Use: No   . Sexual Activity:     Partners: Female     Pharmacist, hospital Protection: Surgical     Other Topics Concern   . Not on file     Social History Narrative       Home Medications:     Prescriptions prior to admission   Medication Sig Dispense Refill Last Dose   . acetaminophen (TYLENOL) 325 MG tablet Take 2 tablets (650 mg total) by mouth every 6 (six) hours as needed for Pain or Fever. 30 tablet 0    . adalimumab (HUMIRA) 40 MG/0.8ML injection Inject 40 mg into the skin every 14 (fourteen) days. Last injection 04/29/14     07/30/2015 at Unknown time   . aspirin 81 MG chewable tablet Chew 81 mg by mouth daily.   07/30/2015 at Unknown time   . benazepril (LOTENSIN) 20 MG tablet Take 20 mg by mouth daily.      05/31/2015 at Unknown time   . calcipotriene (DOVONOX) 0.005 % cream Apply topically 2 (two)  times daily.   07/30/2015 at Unknown time   . ciclopirox (LOPROX) 0.77 % cream Apply topically 2 (two) times daily.   07/30/2015 at Unknown time   . clobetasol (TEMOVATE) 0.05 % cream Apply topically 2 (two) times daily.   07/30/2015 at Unknown time   . co-enzyme Q-10 30 MG capsule Take 30 mg by mouth 3 (three) times daily.   07/30/2015 at Unknown time   . cyclobenzaprine (FLEXERIL) 10 MG tablet Take 10 mg by mouth 3 (three) times daily as needed for Muscle spasms.   07/30/2015 at Unknown time   . diazepam (VALIUM) 5 MG tablet Take 5 mg by mouth every 6 (six) hours as needed for Anxiety.   07/30/2015 at Unknown time   . DULoxetine (CYMBALTA) 60 MG capsule Take 60 mg by mouth daily.   07/30/2015 at Unknown time   . folic acid (FOLVITE) 400 MCG tablet Take 400 mcg by mouth daily.   07/30/2015 at Unknown time   . glipiZIDE (GLUCOTROL) 10 MG tablet Take 10 mg by mouth 2 (two) times daily before meals.   07/30/2015 at Unknown time   . Lidocaine-Menthol 4-5 % Patch Apply topically.   07/30/2015 at Unknown time   . naproxen (NAPROSYN) 500 MG tablet Take 500 mg by mouth 2 (two) times daily with meals.   07/30/2015 at Unknown time   . oxyCODONE-acetaminophen (PERCOCET) 5-325 MG per tablet Take 1 tablet by mouth every 4 (four) hours as needed for Pain (Take 1-2 tabs every 4-6 hrs as needed for pain). 100 tablet 0 Taking   . rosuvastatin (CRESTOR) 5 MG tablet Take 5 mg by mouth daily.   07/30/2015 at Unknown time   . Tapentadol HCl (NUCYNTA ER) 200 MG Tablet SR 12 hr Take 1 tablet by mouth 2 (two) times daily. 150mg  2x daily     Taking   . valsartan (DIOVAN) 80 MG tablet Take 80 mg by mouth daily.  07/30/2015 at Unknown time   . ZETIA 10 MG tablet Take 10 mg by mouth daily.      07/30/2015 at Unknown time        Allergies:     Allergies   Allergen Reactions   . Ciprofloxacin Swelling   . Ace Inhibitors Angioedema   . Gabapentin Other (See Comments)     blurred vision, dizziness       Inpatient Medications:      Scheduled Meds: PRN Meds:       aspirin 81 mg Oral Daily   DULoxetine 60 mg Oral Daily   ezetimibe 10 mg Oral Daily   rosuvastatin 5 mg Oral Daily       Continuous Infusions:      acetaminophen 650 mg Q6H PRN   morphine 2 mg Q6H PRN           Review of Systems:     A comprehensive review of systems was: Awake, alert and oriented, complaining of  headache and pain around nasal area, no fever, chills. No dizziness or blurring of vision.  No neck pain or chest pain or palpitation, no cough or wheezing.  No abdominal pain, no nausea, vomiting, diarrhea.  No dysuria, urgency or frequency.      Physical Exam:     VITAL SIGNS   Temp:  [97.9 F (36.6 C)] 97.9 F (36.6 C)  Heart Rate:  [60-84] 79  Resp Rate:  [13-24] 20  BP: (98-141)/(50-67) 126/62 mmHg  Blood Glucose:  Pulse ox:  Telemetry:       Intake/Output Summary (Last 24 hours) at 10/05/15 1531  Last data filed at 10/05/15 1500   Gross per 24 hour   Intake     30 ml   Output      0 ml   Net     30 ml          HEENT: Atraumatic, normocephalic.  Pupils are equal and reactive, dressing around the nose and have nasal packing  Neck: Supple, no lymphadenopathy or thyromegaly.  Neurology: Awake, alert and oriented, no focal deficit or movement disorder noted  Lungs: Clear to auscultation and percussion, no crackles or wheeze.  Cardiovascular: S1 and S2 regular in rate and rhythm.  No murmur or gallop.  Abdomen : Soft, nontender, no organomegaly with positive bowel sound.  Extremities: No edema, cyanosis or clubbing        Lines/Drains/Airways:    Patient Lines/Drains/Airways Status    Active PICC Line / CVC Line / PIV Line / Drain / Airway / Intraosseous Line / Epidural Line / ART Line / Line / Wound / Pressure Ulcer / NG/OG Tube     Name:   Placement date:   Placement time:   Site:   Days:    Peripheral IV 10/05/15 Right Antecubital  10/05/15   1141   Antecubital   less than 1    Peripheral IV 10/05/15 Right Hand  10/05/15      Hand   less than 1    Wound 10/05/15 Surgical Nose  10/05/15   1445   Nose    less than 1                Labs:     Results     Procedure Component Value Units Date/Time    CBC without differential [[id number]]  (Abnormal) Collected:  10/05/15 1457    Specimen Information:  Blood from Blood Updated:  10/05/15 1508  WBC 14.03 (H) x10 3/uL      Hgb 14.5 g/dL      Hematocrit 16.1 %      Platelets 216 x10 3/uL      RBC 4.52 (L) x10 6/uL      MCV 97.1 fL      MCH 32.1 (H) pg      MCHC 33.0 g/dL      RDW 13 %      MPV 9.5 fL      Nucleated RBC 0 /100 WBC     Basic Metabolic Panel [[id number]] Collected:  10/05/15 1457    Specimen Information:  Blood Updated:  10/05/15 1504    MRSA culture [[id number]] Collected:  10/05/15 1457    Specimen Information:  Body Fluid from Throat Updated:  10/05/15 1504    B-type Natriuretic Peptide [[id number]] Collected:  10/05/15 1259    Specimen Information:  Blood Updated:  10/05/15 1417     B-Natriuretic Peptide 17 pg/mL     D-Dimer [[id number]]  (Abnormal) Collected:  10/05/15 1255     D-Dimer 0.56 (H) ug/mL FEU Updated:  10/05/15 1414    Comprehensive Metabolic Panel (CMP) [[id number]]  (Abnormal) Collected:  10/05/15 1259    Specimen Information:  Blood Updated:  10/05/15 1343     Glucose 265 (H) mg/dL      BUN 01.0 mg/dL      Creatinine 1.4 (H) mg/dL      Sodium 272 mEq/L      Potassium 6.4 (HH) mEq/L      Chloride 109 mEq/L      CO2 20 (L) mEq/L      Calcium 8.4 (L) mg/dL      Protein, Total 5.8 (L) g/dL      Albumin 3.5 g/dL      AST (SGOT) 58 (H) U/L      ALT 93 (H) U/L      Alkaline Phosphatase 94 U/L      Bilirubin, Total 0.3 mg/dL      Globulin 2.3 g/dL      Albumin/Globulin Ratio 1.5      Anion Gap 9.0     GFR [[id number]] Collected:  10/05/15 1259     EGFR 51.3 Updated:  10/05/15 1343    Troponin I [[id number]] Collected:  10/05/15 1259    Specimen Information:  Blood Updated:  10/05/15 1336     Troponin I 0.04 ng/mL     CBC and differential [[id number]] Collected:  10/05/15 1151    Specimen Information:  Blood from Blood Updated:  10/05/15 1311     WBC see  note x10 3/uL      Hgb see note g/dL      Hematocrit see note %      Platelets see note x10 3/uL      RBC see note x10 6/uL      MCV see note fL      MCH see note pg      MCHC see note g/dL      RDW see note %      MPV see note fL      Neutrophils see note %      Lymphocytes Automated see note %      Monocytes see note %      Eosinophils Automated see note %      Basophils Automated see note %      Immature Granulocyte see note %      Nucleated RBC see note /100  WBC      Neutrophils Absolute see note x10 3/uL      Abs Lymph Automated see note x10 3/uL      Abs Mono Automated see note x10 3/uL      Abs Eos Automated see note x10 3/uL      Absolute Baso Automated see note x10 3/uL      Absolute Immature Granulocyte see note x10 3/uL     Troponin I [[id number]] Collected:  10/05/15 1150    Specimen Information:  Blood Updated:  10/05/15 1253     Troponin I see note ng/mL             Rads:     Radiology Results (24 Hour)     Procedure Component Value Units Date/Time    Chest AP Portable [[id number]] Collected:  10/05/15 1239    Order Status:  Completed Updated:  10/05/15 1243    Narrative:      History: chest pain protocol    Technique: Single Portable View    Comparison: 04/27/2014    Findings:  Is mild patchy interstitial scarring at the left base and mild patchy  new interstitial lung disease at the right  There is no pneumothorax.  The heart is normal in size.    The mediastinum is within normal limits.             Impression:       New patchy right basilar airspace disease which  may represent atelectasis or pneumonia    Laurena Slimmer, MD   10/05/2015 12:39 PM          Time 45 min    Signed by: Lattie Haw  Date/Time: 10/05/2015 3:31 PM

## 2015-10-05 NOTE — Progress Notes (Deleted)
Discussed with patient's mother, explained patient's clinical condition and treatment plan.  Also discussed the need for tracheostomy and she agreed for tracheostomy

## 2015-10-05 NOTE — Plan of Care (Signed)
Received patient from [facility] at 1435 hrs (approximately).  Patient slightly drowsy but arousable. S/P Septoplasty. Minimal bloody drainage.  Dressing changed as needed. Reviewed plan of care with patient and spouse. Attending made aware of patient arrival to unit and seen.  Cardiology consult in progress.  Continue to monitor per unit protocol.

## 2015-10-05 NOTE — Plan of Care (Signed)
Problem: Safety  Goal: Patient will be free from injury during hospitalization  Outcome: Progressing  Oriented to unit and plan of care.  Discussed falls prevention measures.  Call bell at hand.  Bed locked and in lowest position.  Intervention: Assess patient's risk for falls and implement fall prevention plan of care per policy  Done and documented.  Intervention: Provide and maintain safe environment  See above.  Intervention: Hourly rounding.  Done and documented.

## 2015-10-05 NOTE — Consults (Addendum)
[facility] CARDIOLOGY CONSULTATION REPORT  [facility]    Date Time: 10/05/2015 3:24 PM  Patient Name: Kent Jordan  Requesting Physician: Lattie Haw, MD       Reason for Consultation:   Abnormal EKG    History:   Kent Jordan is a 65 y.o. male admitted on 10/05/2015.  We have been asked by Lattie Haw, MD,  to provide cardiac consultation, regarding abnormal EKG.  H/o HTN.  H/o recent angioedema (attributed to ACEi which he had been on for years).  Switched to ARB since.  ENT evaluation noted septal deviation.  Outpatient surgery center today for septoplasty.  Bigeminy in recovery, wide QRS.  Sent to ER ([facility]).  Called to me on IV line about EKG changes but no chest pain (no emergent cath).  Dr. Chestine Spore ([facility]) obtained labs showing elevated creatinine and high K.  Transferred to ICU at [facility].  Dr. Harriett Rush treated with insulin, lasix.  EKG now NSR, narrow complex.  Prior stress tests (years ago).  FHX CAD.  Chronic back pain but walks regularly, sometimes with dyspnea (no clear trend).  Some decreased po intake recently.  NPO for the septoplasty.     Past Medical History:     Past Medical History   Diagnosis Date   . Back pain    . Hypertensive disorder    . High cholesterol    . Failed back syndrome    . Pneumonia 11/2009   . Bilateral cataracts      removed 2009   . Sleep apnea      Does not use CPAP   . Neuropathy      Tingling left foot, numbness left toes   . Low back pain    . Difficulty in walking(719.7)      Uses cane to ambulate   . Elevated liver function tests    . 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 Psoriasis    . Arthritis      psoriatic and osteoarthritis        Past Surgical History:     Past Surgical History   Procedure Laterality Date   . Lumbar fusion  10/2011   . Microdiscectomy lumbar  11/12     patient had it done 10/12 11/12   . Knee arthrocentesis  1983     right   . Abdominal surgery  03/1971     double hernia repair   . Eye surgery  2009      cataract removal, both eyes    . Back surgery  9/12, 11/12, 2/13     + L-3 to S-1 fusion   . Fracture surgery  1993     right middle finger   . Insertion, spinal cord stimulator generator N/A 05/07/2014     Procedure: DORSAL COLUMN STIMULATOR PLACEMENT;  Surgeon: Tera Helper, MD;  Location: [facility];  Service: Neurosurgery;  Laterality: N/A;  DCS PLACEMENT   . Septoplasty  10/05/2015       Family History:     Family History   Problem Relation Age of Onset   . Heart disease Mother    . Heart disease Father        Social History:     Social History     Social History   . Marital Status: Single     Spouse Name: N/A   . Number of Children: N/A   . Years of Education: N/A  Social History Main Topics   . Smoking status: Former Smoker -- 1.00 packs/Aiysha Jillson for 41 years     Types: Cigarettes     Quit date: 09/26/2011   . Smokeless tobacco: Never Used   . Alcohol Use: 1.2 oz/week     2 Cans of beer per week   . Drug Use: No   . Sexual Activity:     Partners: Female     Pharmacist, hospital Protection: Surgical     Other Topics Concern   . Not on file     Social History Narrative       Allergies:     Allergies   Allergen Reactions   . Ciprofloxacin Swelling   . Ace Inhibitors Angioedema   . Gabapentin Other (See Comments)     blurred vision, dizziness       Medications:     Prescriptions prior to admission   Medication Sig   . acetaminophen (TYLENOL) 325 MG tablet Take 2 tablets (650 mg total) by mouth every 6 (six) hours as needed for Pain or Fever.   Marland Kitchen adalimumab (HUMIRA) 40 MG/0.8ML injection Inject 40 mg into the skin every 14 (fourteen) days. Last injection 04/29/14     . aspirin 81 MG chewable tablet Chew 81 mg by mouth daily.   . benazepril (LOTENSIN) 20 MG tablet Take 20 mg by mouth daily.      . calcipotriene (DOVONOX) 0.005 % cream Apply topically 2 (two) times daily.   . ciclopirox (LOPROX) 0.77 % cream Apply topically 2 (two) times daily.   . clobetasol (TEMOVATE) 0.05 % cream Apply  topically 2 (two) times daily.   Marland Kitchen co-enzyme Q-10 30 MG capsule Take 30 mg by mouth 3 (three) times daily.   . cyclobenzaprine (FLEXERIL) 10 MG tablet Take 10 mg by mouth 3 (three) times daily as needed for Muscle spasms.   . diazepam (VALIUM) 5 MG tablet Take 5 mg by mouth every 6 (six) hours as needed for Anxiety.   . DULoxetine (CYMBALTA) 60 MG capsule Take 60 mg by mouth daily.   . folic acid (FOLVITE) 400 MCG tablet Take 400 mcg by mouth daily.   Marland Kitchen glipiZIDE (GLUCOTROL) 10 MG tablet Take 10 mg by mouth 2 (two) times daily before meals.   . Lidocaine-Menthol 4-5 % Patch Apply topically.   . naproxen (NAPROSYN) 500 MG tablet Take 500 mg by mouth 2 (two) times daily with meals.   Marland Kitchen oxyCODONE-acetaminophen (PERCOCET) 5-325 MG per tablet Take 1 tablet by mouth every 4 (four) hours as needed for Pain (Take 1-2 tabs every 4-6 hrs as needed for pain).   . rosuvastatin (CRESTOR) 5 MG tablet Take 5 mg by mouth daily.   . Tapentadol HCl (NUCYNTA ER) 200 MG Tablet SR 12 hr Take 1 tablet by mouth 2 (two) times daily. 150mg  2x daily     . valsartan (DIOVAN) 80 MG tablet Take 80 mg by mouth daily.   Marland Kitchen ZETIA 10 MG tablet Take 10 mg by mouth daily.               Review of Systems:    Comprehensive review of systems including constitutional, eyes, ears, nose, mouth, throat, cardiovascular, GI, GU, musculoskeletal, integumentary, respiratory, neurologic, psychiatric, and endocrine is negative other than what is mentioned already in the history of present illness    Physical Exam:     VITAL SIGNS PHYSICAL EXAM   Filed Vitals:    10/05/15 1500   BP: 126/62  Pulse: 79   Temp:    Resp: 20   SpO2: 96%     Temp (24hrs), Avg:97.9 F (36.6 C), Min:97.9 F (36.6 C), Max:97.9 F (36.6 C)      Intake and Output Summary (Last 24 hours) at Date Time  No intake or output data in the 24 hours ending 10/05/15 1524    Telemetry: no changes Physical Exam  General: awake, alert, breathing comfortably, no acute distress  Head: normocephalic.   Postop septoplasty.  Ice mask.    Eyes: EOM's intact  Cardiovascular: regular rate and rhythm, normal S1, S2, no S3, no S4, no murmurs, rubs or gallops  Neck: no carotid bruits or JVD  Lungs: clear to auscultation bilateraly, without wheezing, rhonchi, or rales  Abdomen: soft, non-tender, non-distended; no palpable masses,  normoactive bowel sounds  Extremities: no edema  Pulse: equal pulses, 4/4 symmetric  Neurological: Alert and oriented X3, mood and affect normal  Musculoskeletal: normal strength and tone     Labs Reviewed:     Recent Labs  Lab 10/05/15  1259 10/05/15  1150   TROPONIN I 0.04 see note               Recent Labs  Lab 10/05/15  1259   BILIRUBIN, TOTAL 0.3   PROTEIN, TOTAL 5.8*   ALBUMIN 3.5   ALT 93*   AST (SGOT) 58*               Recent Labs  Lab 10/05/15  1457 10/05/15  1151   WBC 14.03* see note   HGB 14.5 see note   HEMATOCRIT 43.9 see note   PLATELETS 216 see note       Recent Labs  Lab 10/05/15  1259   SODIUM 138   POTASSIUM 6.4*   CHLORIDE 109   CO2 20*   BUN 23.0   CREATININE 1.4*   EGFR 51.3   GLUCOSE 265*   CALCIUM 8.4*       DIAGNOSTIC    EKG:     chest X-ray    Assessment:    Postop EKG changes (outpatient surgery center, septoplasty).  Sent to ER ([facility]).  No emergent cath (no chest pain).  High K.  Now treated.  EKG back to normal.   Mildly elevated creatinine.    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  Has been on ARB since.   H/o HTN.   H/o DM.   FHX CAD.     Prior stress tests okay.   Back issues (chronic).    Recommendations:    Treating K.   Would avoid ARB (prior angioedema from ACEi and now high K, high creat).   Doubt inpatient cardiac testing (EKG back to normal, troponin normal, no chest pain).   We will follow.   Will arrange outpatient stress testing (risks for CAD, some DOE) post discharge.             Signed by: Bertram Millard Alicia Ackert, MD    [facility]  [id number]

## 2015-10-05 NOTE — H&P (Signed)
ADMISSION HISTORY AND PHYSICAL EXAM    Date Time: 10/05/2015 3:34 PM  Patient Name: Kent Jordan  Attending Physician: Lattie Haw, MD      History of Present Illness:   Kent Jordan is a 65 y.o. male who presents to the hospital with Chief Complaint   Patient presents with   . Irregular Heart Beat     Hx HTN    Pt in out patient surgery ctr  Noted to be in bigeminy wide QRS  [facility] [place]    Transferred to [facility] hospital ICU    Wbc 14 hct 43   Na 138 k 6.4  Bun 23 crt 1.4  Glucose 265    Chest xray R basilar airspace disease     Past Medical History:     Past Medical History   Diagnosis Date   . Back pain    . Hypertensive disorder    . High cholesterol    . Failed back syndrome    . Pneumonia 11/2009   . Bilateral cataracts      removed 2009   . Sleep apnea      Does not use CPAP   . Neuropathy      Tingling left foot, numbness left toes   . Low back pain    . Difficulty in walking(719.7)      Uses cane to ambulate   . Elevated liver function tests    . 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 Psoriasis    . Arthritis      psoriatic and osteoarthritis        Past Surgical History:     Past Surgical History   Procedure Laterality Date   . Lumbar fusion  10/2011   . Microdiscectomy lumbar  11/12     patient had it done 10/12 11/12   . Knee arthrocentesis  1983     right   . Abdominal surgery  03/1971     double hernia repair   . Eye surgery  2009     cataract removal, both eyes    . Back surgery  9/12, 11/12, 2/13     + L-3 to S-1 fusion   . Fracture surgery  1993     right middle finger   . Insertion, spinal cord stimulator generator N/A 05/07/2014     Procedure: DORSAL COLUMN STIMULATOR PLACEMENT;  Surgeon: Tera Helper, MD;  Location: [facility];  Service: Neurosurgery;  Laterality: N/A;  DCS PLACEMENT   . Septoplasty  10/05/2015       Family History:     Family History   Problem Relation Age of Onset   . Heart disease Mother    . Heart disease Father        Social  History:     Social History     Social History   . Marital Status: Single     Spouse Name: N/A   . Number of Children: N/A   . Years of Education: N/A     Social History Main Topics   . Smoking status: Former Smoker -- 1.00 packs/day for 41 years     Types: Cigarettes     Quit date: 09/26/2011   . Smokeless tobacco: Never Used   . Alcohol Use: 1.2 oz/week     2 Cans of beer per week   . Drug Use: No   . Sexual Activity:     Partners: Female  Birth Control/ Protection: Surgical     Other Topics Concern   . Not on file     Social History Narrative       Allergies:     Allergies   Allergen Reactions   . Ciprofloxacin Swelling   . Ace Inhibitors Angioedema   . Gabapentin Other (See Comments)     blurred vision, dizziness       Medications:     Prescriptions prior to admission   Medication Sig   . acetaminophen (TYLENOL) 325 MG tablet Take 2 tablets (650 mg total) by mouth every 6 (six) hours as needed for Pain or Fever.   Marland Kitchen adalimumab (HUMIRA) 40 MG/0.8ML injection Inject 40 mg into the skin every 14 (fourteen) days. Last injection 04/29/14     . aspirin 81 MG chewable tablet Chew 81 mg by mouth daily.   . benazepril (LOTENSIN) 20 MG tablet Take 20 mg by mouth daily.      . calcipotriene (DOVONOX) 0.005 % cream Apply topically 2 (two) times daily.   . ciclopirox (LOPROX) 0.77 % cream Apply topically 2 (two) times daily.   . clobetasol (TEMOVATE) 0.05 % cream Apply topically 2 (two) times daily.   Marland Kitchen co-enzyme Q-10 30 MG capsule Take 30 mg by mouth 3 (three) times daily.   . cyclobenzaprine (FLEXERIL) 10 MG tablet Take 10 mg by mouth 3 (three) times daily as needed for Muscle spasms.   . diazepam (VALIUM) 5 MG tablet Take 5 mg by mouth every 6 (six) hours as needed for Anxiety.   . DULoxetine (CYMBALTA) 60 MG capsule Take 60 mg by mouth daily.   . folic acid (FOLVITE) 400 MCG tablet Take 400 mcg by mouth daily.   Marland Kitchen glipiZIDE (GLUCOTROL) 10 MG tablet Take 10 mg by mouth 2 (two) times daily before meals.   .  Lidocaine-Menthol 4-5 % Patch Apply topically.   . naproxen (NAPROSYN) 500 MG tablet Take 500 mg by mouth 2 (two) times daily with meals.   Marland Kitchen oxyCODONE-acetaminophen (PERCOCET) 5-325 MG per tablet Take 1 tablet by mouth every 4 (four) hours as needed for Pain (Take 1-2 tabs every 4-6 hrs as needed for pain).   . rosuvastatin (CRESTOR) 5 MG tablet Take 5 mg by mouth daily.   . Tapentadol HCl (NUCYNTA ER) 200 MG Tablet SR 12 hr Take 1 tablet by mouth 2 (two) times daily. 150mg  2x daily     . valsartan (DIOVAN) 80 MG tablet Take 80 mg by mouth daily.   Marland Kitchen ZETIA 10 MG tablet Take 10 mg by mouth daily.          Review of Systems:    A comprehensive review of systems was: General ROS: negative for - chills, fever or night sweats  Psychological ROS: negative for - anxiety, depression, disorientation, hallucinations or suicidal ideation  ENT ROS: negative for - headaches, nasal congestion, sinus pain or visual changes  Allergy and Immunology ROS: negative for - itchy/watery eyes, nasal congestion or postnasal drip  Hematological and Lymphatic ROS: negative for - bleeding problems, bruising, pallor or weight loss  Endocrine ROS: negative for - malaise/lethargy or polydipsia/polyuria  Respiratory ROS: negative for - cough, orthopnea, shortness of breath or wheezing  Cardiovascular ROS: negative for - chest pain, dyspnea on exertion, orthopnea or shortness of breath  Gastrointestinal ROS: negative for - abdominal pain, constipation, diarrhea or nausea/vomiting  Genito-Urinary ROS: negative for - change in urinary stream, dysuria, hematuria or nocturia  Musculoskeletal ROS: negative for -  joint pain, joint stiffness or joint swelling  Neurological ROS: negative for - confusion, dizziness, headaches, memory loss or speech problems  Dermatological ROS: negative for pruritus, rash and skin lesion changes      Physical Exam:     Filed Vitals:    10/05/15 1500   BP: 126/62   Pulse: 79   Temp:    Resp: 20   SpO2: 96%       Intake and  Output Summary (Last 24 hours) at Date Time    Intake/Output Summary (Last 24 hours) at 10/05/15 1534  Last data filed at 10/05/15 1500   Gross per 24 hour   Intake     30 ml   Output      0 ml   Net     30 ml        General appearance - alert, well appearing, and in no distress  Mental status - alert, oriented to person, place, and time  Eyes - pupils equal and reactive, extraocular eye movements intact  Ears - bilateral TM's and external ear canals normal  Nose - normal and patent, no erythema, discharge or polyps  Mouth - mucous membranes moist, pharynx normal without lesions  Neck - supple, no significant adenopathy  Lymphatics - no palpable lymphadenopathy, no hepatosplenomegaly  Chest - clear to auscultation, no wheezes, rales or rhonchi, symmetric air entry  Heart - normal rate, regular rhythm, normal S1, S2, no murmurs, rubs, clicks or gallops  Abdomen - soft, nontender, nondistended, no masses or organomegaly  Neurological - alert, oriented, normal speech, no focal findings or movement disorder noted  Musculoskeletal - no joint tenderness, deformity or swelling  Extremities - peripheral pulses normal, no pedal edema, no clubbing or cyanosis  Skin - normal coloration and turgor, no rashes, no suspicious skin lesions noted    Labs:       Recent Labs  Lab 10/05/15  1259   SODIUM 138   POTASSIUM 6.4*   CHLORIDE 109   CO2 20*   BUN 23.0   CREATININE 1.4*   CALCIUM 8.4*   ALBUMIN 3.5   PROTEIN, TOTAL 5.8*   BILIRUBIN, TOTAL 0.3   ALKALINE PHOSPHATASE 94   ALT 93*   AST (SGOT) 58*   GLUCOSE 265*       Recent Labs  Lab 10/05/15  1457   WBC 14.03*   HGB 14.5   HEMATOCRIT 43.9   PLATELETS 216           Rads:   Radiological Procedure reviewed.  Radiology Results (24 Hour)     Procedure Component Value Units Date/Time    Chest AP Portable [[id number]] Collected:  10/05/15 1239    Order Status:  Completed Updated:  10/05/15 1243    Narrative:      History: chest pain protocol    Technique: Single Portable  View    Comparison: 04/27/2014    Findings:  Is mild patchy interstitial scarring at the left base and mild patchy  new interstitial lung disease at the right  There is no pneumothorax.  The heart is normal in size.    The mediastinum is within normal limits.             Impression:       New patchy right basilar airspace disease which  may represent atelectasis or pneumonia    Laurena Slimmer, MD   10/05/2015 12:39 PM            Assessment:  Post op arrythimia   Leukocytosis  S/p nasal septum surgery  Hypotension  Hyperkalemia      Plan:   Admit to ICU  Hyperkalemia  Treated with insulin lasix   Repeat EKG NSR     Hypotension      Post op nasal septum surgery       HTN  Hx angioedema on ACE       Renal insufficiency     HLD on crestor and zetia obtain lipid panel           Signed by: Laveda Norman, MD

## 2015-10-06 LAB — BASIC METABOLIC PANEL
Anion Gap: 8 (ref 5.0–15.0)
BUN: 19 mg/dL (ref 9.0–28.0)
CO2: 24 mEq/L (ref 22–29)
Calcium: 9.1 mg/dL (ref 8.5–10.5)
Chloride: 106 mEq/L (ref 100–111)
Creatinine: 1.1 mg/dL (ref 0.7–1.3)
Glucose: 133 mg/dL — ABNORMAL HIGH (ref 70–100)
Potassium: 4.5 mEq/L (ref 3.5–5.1)
Sodium: 138 mEq/L (ref 136–145)

## 2015-10-06 LAB — HEMOLYSIS INDEX
Hemolysis Index: 15 (ref 0–18)
Hemolysis Index: 15 (ref 0–18)

## 2015-10-06 LAB — CBC AND DIFFERENTIAL
Basophils Absolute Automated: 0.01 10*3/uL (ref 0.00–0.20)
Basophils Automated: 0 %
Eosinophils Absolute Automated: 0 10*3/uL (ref 0.00–0.70)
Eosinophils Automated: 0 %
Hematocrit: 41.1 % — ABNORMAL LOW (ref 42.0–52.0)
Hgb: 13.9 g/dL (ref 13.0–17.0)
Immature Granulocytes Absolute: 0.04 10*3/uL
Immature Granulocytes: 0 %
Lymphocytes Absolute Automated: 2.13 10*3/uL (ref 0.50–4.40)
Lymphocytes Automated: 15 %
MCH: 32.6 pg — ABNORMAL HIGH (ref 28.0–32.0)
MCHC: 33.8 g/dL (ref 32.0–36.0)
MCV: 96.3 fL (ref 80.0–100.0)
MPV: 9.5 fL (ref 9.4–12.3)
Monocytes Absolute Automated: 1.58 10*3/uL — ABNORMAL HIGH (ref 0.00–1.20)
Monocytes: 11 %
Neutrophils Absolute: 10.14 10*3/uL — ABNORMAL HIGH (ref 1.80–8.10)
Neutrophils: 73 %
Nucleated RBC: 0 /100 WBC (ref 0–1)
Platelets: 225 10*3/uL (ref 140–400)
RBC: 4.27 10*6/uL — ABNORMAL LOW (ref 4.70–6.00)
RDW: 13 % (ref 12–15)
WBC: 13.86 10*3/uL — ABNORMAL HIGH (ref 3.50–10.80)

## 2015-10-06 LAB — ECG 12-LEAD
Atrial Rate: 93 {beats}/min
Atrial Rate: 59 {beats}/min
Atrial Rate: 67 {beats}/min
P Axis: -19 degrees
P-R Interval: 160 ms
Q-T Interval: 398 ms
Q-T Interval: 394 ms
Q-T Interval: 386 ms
QRS Duration: 116 ms
QRS Duration: 92 ms
QRS Duration: 96 ms
QTC Calculation (Bezet): 441 ms
QTC Calculation (Bezet): 406 ms
QTC Calculation (Bezet): 407 ms
R Axis: -34 degrees
R Axis: -29 degrees
R Axis: -25 degrees
T Axis: 59 degrees
T Axis: 21 degrees
T Axis: 1 degrees
Ventricular Rate: 74 {beats}/min
Ventricular Rate: 64 {beats}/min
Ventricular Rate: 67 {beats}/min

## 2015-10-06 LAB — LIPID PANEL
Cholesterol / HDL Ratio: 3.4
Cholesterol: 131 mg/dL (ref 0–199)
HDL: 38 mg/dL — ABNORMAL LOW (ref 40–9999)
LDL Calculated: 72 mg/dL (ref 0–99)
Triglycerides: 103 mg/dL (ref 34–149)
VLDL Calculated: 21 mg/dL (ref 10–40)

## 2015-10-06 LAB — GFR: EGFR: >60

## 2015-10-06 LAB — HEMOGLOBIN A1C
Average Estimated Glucose: 119.8 mg/dL
Hemoglobin A1C: 5.8 % (ref 4.6–5.9)

## 2015-10-06 LAB — GLUCOSE WHOLE BLOOD - POCT: Whole Blood Glucose POCT: 134 mg/dL — ABNORMAL HIGH (ref 70–100)

## 2015-10-06 NOTE — Discharge Summary (Signed)
DISCHARGE SUMMARY    Date Time: 10/06/2015 9:38 AM  Patient Name: Kent Jordan  Attending Physician: Lattie Haw, MD    Date of Admission:   10/05/2015    Date of Discharge:   10/06/2015    Reason for Admission:     . Hyperkalemia.  . Cardiac arrhythmia.  . Hypertension  . Acute renal  . Chronic back pain.  . Status post septoplasty.  . Hyperlipidemia      Problems:     . Hyperkalemia.  . Cardiac arrhythmia.  . Hypertension  . Acute renal  . Chronic back pain.  . Status post septoplasty.  . Hyperlipidemia      Discharge Dx:   Marland Kitchen Hyperkalemia.  . Cardiac arrhythmia.  . Hypertension  . Acute renal  . Chronic back pain.  . Status post septoplasty.  . Hyperlipidemia        Consultations:   Treatment Team:   Attending Provider: Lattie Haw, MD  Consulting Physician: Lattie Haw, MD  Consulting Physician: Montey Hora, MD  Consulting Physician: Laveda Norman, MD    Procedures performed:   No orders of the defined types were placed in this encounter.        Discharge Medications:        Medication List      ASK your doctor about these medications          acetaminophen 325 MG tablet   Commonly known as:  TYLENOL   Take 2 tablets (650 mg total) by mouth every 6 (six) hours as needed for Pain or Fever.       aspirin 81 MG chewable tablet       benazepril 20 MG tablet   Commonly known as:  LOTENSIN       calcipotriene 0.005 % cream   Commonly known as:  DOVONOX       ciclopirox 0.77 % cream   Commonly known as:  LOPROX       clobetasol 0.05 % cream   Commonly known as:  TEMOVATE       co-enzyme Q-10 30 MG capsule       cyclobenzaprine 10 MG tablet   Commonly known as:  FLEXERIL       diazePAM 5 MG tablet   Commonly known as:  VALIUM       DULoxetine 60 MG capsule   Commonly known as:  CYMBALTA       folic acid 400 MCG tablet   Commonly known as:  FOLVITE       glipiZIDE 10 MG tablet   Commonly known as:  GLUCOTROL       HUMIRA 40 MG/0.8ML injection   Generic drug:  adalimumab       Lidocaine-Menthol  4-5 % Ptch       naproxen 500 MG tablet   Commonly known as:  NAPROSYN       NUCYNTA ER 200 MG Tb12   Generic drug:  Tapentadol HCl       oxyCODONE-acetaminophen 5-325 MG per tablet   Commonly known as:  PERCOCET   Take 1 tablet by mouth every 4 (four) hours as needed for Pain (Take 1-2 tabs every 4-6 hrs as needed for pain).       rosuvastatin 5 MG tablet   Commonly known as:  CRESTOR       valsartan 80 MG tablet   Commonly known as:  DIOVAN       ZETIA 10 MG tablet   Generic  drug:  ezetimibe               Hospital Course:     65 year old white male was admitted yesterday for ventricular tachycardia after septoplasty.  Patient was found to have hyperkalemia with acute renal failure.  Clinical condition improved over the next 24 hours.  He will be discharged to home.  He will be followed by Dr.Saidi and [place] [facility] for stress test       Laboratory Data     CBC    Recent Labs  Lab 10/06/15  0247 10/05/15  1457 10/05/15  1151   WBC 13.86* 14.03* see note   HGB 13.9 14.5 see note   HEMATOCRIT 41.1* 43.9 see note   PLATELETS 225 216 see note   MCV 96.3 97.1 see note   NEUTROPHILS 73  --  see note       CMP    Recent Labs  Lab 10/06/15  0247 10/05/15  1457 10/05/15  1259   SODIUM 138 140 138   POTASSIUM 4.5 4.2 6.4*   CHLORIDE 106 111 109   CO2 24 20* 20*   BUN 19.0 22.0 23.0   CREATININE 1.1 1.5* 1.4*   GLUCOSE 133* 298* 265*   CALCIUM 9.1 9.4 8.4*   PROTEIN, TOTAL  --   --  5.8*   ALBUMIN  --   --  3.5   AST (SGOT)  --   --  58*   ALT  --   --  93*   ALKALINE PHOSPHATASE  --   --  94   BILIRUBIN, TOTAL  --   --  0.3       Lipid panel  No results found for: CHOL, TRIG, HDL, LDL          No results found for: TSH    Cardiac enzymes    Recent Labs  Lab 10/05/15  1259 10/05/15  1150   TROPONIN I 0.04 see note        All radiology result for current encounter  Chest Ap Portable    10/05/2015   New patchy right basilar airspace disease which may represent atelectasis or pneumonia Laurena Slimmer, MD 10/05/2015 12:39 PM          Physical Exam:   BP 140/65 mmHg  Pulse 79  Temp(Src) 97.9 F (36.6 C) (Axillary)  Resp 29  Ht 1.778 m (5\' 10" )  Wt 103.4 kg (227 lb 15.3 oz)  BMI 32.71 kg/m2  SpO2 96%    General appearance - alert, and in no distress  HEENT: Normocephalic,atraumatic, pupil equal  Neck - supple  Chest - clear to auscultation  Heart - normal rate and regular rhythm  Abdomen - bowel sounds normal, soft, non distended  Extremities - no pedal edema  Neurological - Alert      Discharge condition:   Stable     Discharge  Diet :   Cardiac     Discharge Instructions:   Follow up: Dr. Parks Ranger  and [place] [facility]     No follow-up provider specified.    Lattie Haw, MD  10/06/2015  9:38 AM

## 2015-10-06 NOTE — Plan of Care (Signed)
Problem: SCIP  Goal: SCIP measures are followed.  Outcome: Completed Date Met:  10/06/15  SCDs worn overnight.     Problem: Constipation  Goal: Fluid and electrolyte balance are achieved/maintained  Outcome: Completed Date Met:  10/06/15  Electrolytes WNL.  Intervention: Provide adequate hydration.  Pt drinking adequate amounts of water.      Comments:   Pt very pleasant. Requesting percocet q4hr, states that it helps take the edge off the nasal pain but doesn't completely get rid of it. HR 70's, NSR. No ectopy overnight. BP 130's. Nasal dressing changed x2, small bloody drainage.

## 2015-10-06 NOTE — UM Notes (Signed)
FEP    Admit to Inpatient (Order [id number]) writen 10/05/2015  11:57AM    Additional History: Kent Jordan is a 65 y.o. male with a hx of DM and HTN presenting to the ED with an irregular heart rhythm. Pt [facility] and while in recovery, his HR went into a "irregular rhythm" and was sent downstairs to the ED for evaluation for r/o MI. He received 1 mg Morphine, 50 mg Ephedrine, and had nitropaste placed on his left chest. Pt is still waking up from anesthesia, but is able to answer questions. Denies abdominal pain or chest pain.    CXR  Impression:      New patchy right basilar airspace disease which  may represent atelectasis or pneumonia                Cardiac Monitor:  Rate: 73  Rhythm: Junctional bigeminy  EKG:  Interpreted by the Emergency Physician.  Time Interpreted: 1120  Rate: 74  Rhythm: Junctional bigeminy with occasional sinus beats.  Interpretation: Widened QRS. ASMIAU  Comparison: Compared to pre-op EKG- sinus rhythm no longer present    EKG:  Interpreted by the Emergency Physician.  Time Interpreted: 1307  Rate: 64  Rhythm: Sinus Rhythm with PACs and junctional beats  Interpretation:ASAIMU  Comparison: Significantly less bigeminal rhythms and QRS improved compared to prior ED EKG      EKG:  Interpreted by the Emergency Physician.  Time Interpreted: 1351  Rate: 67  Rhythm: Sinus Rhythm with a PAC  Interpretation: ASAIMU  Comparison: Bigeminy resolved from prior      Clinical Impression:   1. Bigeminal rhythm    2. Status post operation on nasal septum    3. Atelectasis    4. Postprocedural hypotension    5. Hyperkalemia    6. Acute renal failure, unspecified acute renal failure type                 T 97.9, HR 60, RR 29, BP 142/67,  D-Dimer 0.56, Glucose  265, Creatine 1.4, K 6.4, CO2 20, Ca 8.4, Protein total 5.8, AST 58, ALT 93,     IN ED  IVF 1000cc, calcium chloride 1g, dextrose 25g, insulin IV     Admit to Inpatient (Order [id number])  ASA PO daily, Cymbalta PO daily, Crestor PO daily, Zetia PO daily, Tylenol PO Q6 PRN, morphine IV Q6 PRN, SSI, Percocet PO Q4 PRN x1 dose, Percocet PO Q4 PRN,

## 2015-10-06 NOTE — Discharge Instructions (Signed)
Arrhythmia    Electrical impulses cause the normal heart to beat 60 to 100 times a minute. These impulses come from a natural pacemaker deep inside the heart muscle. Each impulse causes the heart muscle to contract. This causes the blood to flow through the heart and out to the tissues and organs of your body.  An arrhythmia is a change from the normal speed or pattern of these electrical impulses. This can cause the heart to beat too fast (tachycardia); or too slow (bradycardia); or in an unsteady pattern (irregular rhythm).  Symptoms of arrhythmias  Different people experience arrhythmias differently. Sometimes they may not have symptoms, but just notice a change in their pulse. Symptoms can include:   Fluttering feeling in the chest   Shortness of breath   Chest pain or pressure   Lightheadedness or dizziness   Fainting or nearly fainting   Palpitations   Tiredness, fatigue, or weakness  Causes of arrhythmias  Arrhythmias are most often due to heart disease such as:   Coronary artery disease (arteriosclerosis)   Disease of the heart valves   Enlarged heart   High blood pressure   Heart failure  Other causes ofarrhythmia include:   Certain medicines (such as asthma inhalers and decongestants)   Some herbal supplements   Cardiac stimulant drugs (such as cocaine, amphetamine, diet pills, certain decongestant cold medicines, caffeine, and nicotine)   Excessive alcohol use   Medical conditions such as thyroid disease, anemia, anxiety, and panic disorder  Arrythmias can often be prevented. The cause and type of arrhythmia determines the best treatment. Sometimes your doctor may want to monitor your heart rate over a 24-hour period or longer. This can help identify the cause of your arrhythmia and find the best treatment. This can be done with a Holter monitor,a portable EKG recording device attached by wires to your chest. You can carry this with you as you perform your routine activities during  the monitoring period.  Home care  1. Avoid cardiac stimulants (such as cocaine, amphetamine, diet pills, certain decongestant cold medicines, caffeine, and nicotine).  2. If you smoke, stop smoking. Contact your doctor or a local stop-smoking program for help.  3. Tell your doctor about any prescription, over-the-counter or herbal medicines you take. These may be affecting your heart rhythm.  Follow-up care  Follow up with your health care provider or as advised by our staff. If a Holter monitor has been recommended, contact the cardiologistyou have been referred toas soon as you canpick up the device. Other outpatient tests may also be arranged for you at that time.  Call 911  This is the fastest and safest way to get to the emergency department. The paramedics can also start treatment on the way to the hospital, if needed.  Don'twait until your symptoms are severe to call 911. Other reasons to call 911 besides chest pain include:   Chest, shoulder, arm, neck, or back pain   Shortness of breath   Feeling lightheaded, faint, or dizzy   Rapid heart beat   Slower than usual heart rate compared to your normal   Angina withweakness, dizziness, fainting, heavy sweating, nausea, or vomiting   Extreme drowsiness, or confusion   Weakness of an arm or leg or one side of the face   Difficulty with speech or vision  When to seek medical advice  Remember, things are not always like they are on TV. Sometimes it is not so obvious. You may only feel weak or   just "not right." If it is not clear or if you have any doubt, call for advice.   Seek help for chest pain, or it feels different from usual, even if your symptoms are mild.   Do not drive yourself. Have someone else drive. If no one can drive you, call 911.   If your doctor has given you medicines to take when you have symptoms, take them, but do not delay getting help while trying to find them.   Do not delay. Fast diagnosis and treatment can prevent or  limit the amount of heart damage during a heart attack or stroke.   Do not go to your doctor's ofice or a clinic because they will not be able to provide all of the testing or treatment required for this condition.   2000-2015 [employer] [address], [place] [address]. All rights reserved. This information is not intended as a substitute for professional medical care. Always follow your healthcare professional's instructions.

## 2015-10-06 NOTE — Discharge Instr - Activity (Signed)
As instructed on Post-Operative Discharge Instructions.

## 2015-10-06 NOTE — Progress Notes (Signed)
ICU and Surgical discharge orders reviewed with patient and spouse.  Understand and agree with discharge orders. All belongings present and accounted.  Transported to lobby via wheelchair.

## 2015-10-06 NOTE — Progress Notes (Addendum)
[facility]  PROGRESS NOTE  [facility]     Date Time: 10/06/2015 10:10 AM  Patient Name: Kent Jordan, Kent Jordan     Patient Active Problem List   Diagnosis   . Low back pain   . Pre-op evaluation   . Hyperlipidemia   . HTN (hypertension)   . Lumbago   . Bigeminal rhythm   . Arrhythmia   . Hypotension   . Leukocytosis   . Pulmonary infiltrate   . Hyperkalemia   . Renal insufficiency     Assessment:    Admit here for postop EKG changes (outpatient surgery center, septoplasty). Sent from postop to ER ([facility]). I was called from ER.  No emergent cath (no chest pain). High K. Now treated. EKG back to normal.   Mildly elevated creatinine.    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 Was on ARB since.   H/o HTN.   H/o DM.   FHX CAD.    Prior stress tests okay.   Back issues (chronic).      Recommendations:    Discharge planning for today (K okay, EKG okay).   Our office will call patient to arrange echo (bigeminy) and lexiscan thallium (EKG changes and risks for CAD, recent surgery and back issues prevent treadmill testing) followed by office follow up to review.        Subjective:   Denies chest pain, SOB or palpitations.    Medications:      Scheduled Meds: PRN Meds:      aspirin 81 mg Oral Daily   DULoxetine 60 mg Oral Daily   ezetimibe 10 mg Oral Daily   rosuvastatin 5 mg Oral Daily       Continuous Infusions:           Home Meds:   Prescriptions prior to admission   Medication Sig Dispense Refill Last Dose   . acetaminophen (TYLENOL) 325 MG tablet Take 2 tablets (650 mg total) by mouth every 6 (six) hours as needed for Pain or Fever. 30 tablet 0    . adalimumab (HUMIRA) 40 MG/0.8ML injection Inject 40 mg into the skin every 14 (fourteen) days. Last injection 04/29/14     07/30/2015 at Unknown time   . calcipotriene (DOVONOX) 0.005 % cream Apply topically 2 (two) times daily.   07/30/2015 at Unknown time   . ciclopirox (LOPROX) 0.77 % cream Apply topically 2 (two) times daily.   07/30/2015 at Unknown  time   . clobetasol (TEMOVATE) 0.05 % cream Apply topically 2 (two) times daily.   07/30/2015 at Unknown time   . co-enzyme Q-10 30 MG capsule Take 30 mg by mouth 3 (three) times daily.   07/30/2015 at Unknown time   . cyclobenzaprine (FLEXERIL) 10 MG tablet Take 10 mg by mouth 3 (three) times daily as needed for Muscle spasms.   07/30/2015 at Unknown time   . diazepam (VALIUM) 5 MG tablet Take 5 mg by mouth every 6 (six) hours as needed for Anxiety.   07/30/2015 at Unknown time   . DULoxetine (CYMBALTA) 60 MG capsule Take 60 mg by mouth daily.   07/30/2015 at Unknown time   . folic acid (FOLVITE) 400 MCG tablet Take 400 mcg by mouth daily.   07/30/2015 at Unknown time   . glipiZIDE (GLUCOTROL) 10 MG tablet Take 10 mg by mouth 2 (two) times daily before meals.   07/30/2015 at Unknown time   . Lidocaine-Menthol 4-5 % Patch Apply topically.   07/30/2015 at Unknown time   .  oxyCODONE-acetaminophen (PERCOCET) 5-325 MG per tablet Take 1 tablet by mouth every 4 (four) hours as needed for Pain (Take 1-2 tabs every 4-6 hrs as needed for pain). 100 tablet 0 Taking   . rosuvastatin (CRESTOR) 5 MG tablet Take 5 mg by mouth daily.   07/30/2015 at Unknown time   . Tapentadol HCl (NUCYNTA ER) 200 MG Tablet SR 12 hr Take 1 tablet by mouth 2 (two) times daily. 150mg  2x daily     Taking   . valsartan (DIOVAN) 80 MG tablet Take 80 mg by mouth daily.   07/30/2015 at Unknown time   . ZETIA 10 MG tablet Take 10 mg by mouth daily.      07/30/2015 at Unknown time   . [DISCONTINUED] aspirin 81 MG chewable tablet Chew 81 mg by mouth daily.   07/30/2015 at Unknown time   . [DISCONTINUED] benazepril (LOTENSIN) 20 MG tablet Take 20 mg by mouth daily.      05/31/2015 at Unknown time   . [DISCONTINUED] naproxen (NAPROSYN) 500 MG tablet Take 500 mg by mouth 2 (two) times daily with meals.   07/30/2015 at Unknown time          Physical Exam:   BP 131/76 mmHg  Pulse 74  Temp(Src) 97.9 F (36.6 C) (Axillary)  Resp 22  Ht 1.778 m (5\' 10" )  Wt 103.4 kg (227 lb  15.3 oz)  BMI 32.71 kg/m2  SpO2 96% O2 Flow Rate (L/min): 15 L/min O2 Device: None (Room air)    Temp (24hrs), Avg:97.9 F (36.6 C), Min:97.8 F (36.6 C), Max:97.9 F (36.6 C)      Telemetry reviewed      Intake and Output Summary (Last 24 hours) at Date Time    Intake/Output Summary (Last 24 hours) at 10/06/15 1010  Last data filed at 10/06/15 0800   Gross per 24 hour   Intake    990 ml   Output   2430 ml   Net  -1440 ml       General Appearance:  Breathing comfortable, no acute distress  Head:  normocephalic  Eyes:  EOM's intact  Neck:  No carotid bruit or jugular venous distension, brisk carotid upstroke  Lungs:  Clear to auscultation throughout, no wheezes, rhonchi or rales, good respiratory effort   Chest Wall:  No tenderness or deformity  Heart:  S1 S2 normal No S3 or S4, without murmur/rub.  PMI non displaced   Abdomen:  Soft, non-tender, positive bowel sounds, no hepatojugular reflux  Extremities:  No cyanosis, clubbing or edema  Pulses:  2+ pedal, radial, brachial pulses equal bilateraly  Neurologic:  Alert and oriented x3, mood and affect normal  Musculoskeletal: normal strength and tone    Labs:     Recent Labs  Lab 10/05/15  1259 10/05/15  1150   TROPONIN I 0.04 see note       Recent Labs  Lab 10/05/15  1259   BILIRUBIN, TOTAL 0.3   PROTEIN, TOTAL 5.8*   ALBUMIN 3.5   ALT 93*   AST (SGOT) 58*               Recent Labs  Lab 10/06/15  0247 10/05/15  1457 10/05/15  1151   WBC 13.86* 14.03* see note   HGB 13.9 14.5 see note   HEMATOCRIT 41.1* 43.9 see note   PLATELETS 225 216 see note       Recent Labs  Lab 10/06/15  0247 10/05/15  1457 10/05/15  1259  SODIUM 138 140 138   POTASSIUM 4.5 4.2 6.4*   CHLORIDE 106 111 109   CO2 24 20* 20*   BUN 19.0 22.0 23.0   CREATININE 1.1 1.5* 1.4*   EGFR >60.0 47.4 51.3   GLUCOSE 133* 298* 265*   CALCIUM 9.1 9.4 8.4*       Lab Results   Component Value Date    BNP 17 10/05/2015       Weight Monitoring 04/29/2014 06/01/2014 06/30/2014 05/31/2015 07/31/2015 10/05/2015 10/05/2015    Height 180.3 cm - - 180.3 cm 180.3 cm 180.3 cm 177.8 cm   Height Method Stated - - Stated Stated - Stated   Weight 97.523 kg 96.616 kg 97.07 kg 97.523 kg 97.523 kg 97 kg 103.4 kg   Weight Method Stated - - Stated Stated Estimated Actual   BMI (calculated) 30 kg/m2 - - 30 kg/m2 30 kg/m2 29.9 kg/m2 32.8 kg/m2         Imaging:   Radiological Procedure            Signed by: Bertram Millard Maxson Oddo, MD      [facility]  NP Spectralink [phone] (8am-5pm)  MD Spectralink [phone] (8am-5pm)  After hours, non urgent consult line [phone]  After Hours, urgent consults [phone]

## 2015-10-06 NOTE — Plan of Care (Signed)
All outcomes met.

## 2015-10-12 ENCOUNTER — Other Ambulatory Visit: Payer: Self-pay | Admitting: Cardiovascular Disease

## 2015-10-12 DIAGNOSIS — I2510 Atherosclerotic heart disease of native coronary artery without angina pectoris: Secondary | ICD-10-CM

## 2015-10-13 ENCOUNTER — Other Ambulatory Visit: Payer: Self-pay | Admitting: Cardiovascular Disease

## 2015-10-13 DIAGNOSIS — I2510 Atherosclerotic heart disease of native coronary artery without angina pectoris: Secondary | ICD-10-CM

## 2015-10-17 ENCOUNTER — Ambulatory Visit
Admission: RE | Admit: 2015-10-17 | Discharge: 2015-10-17 | Disposition: A | Discharge status: Home / Self Care | Payer: BLUE CROSS/BLUE SHIELD | Source: Ambulatory Visit | Attending: Cardiovascular Disease | Admitting: Cardiovascular Disease

## 2015-10-17 ENCOUNTER — Other Ambulatory Visit (INDEPENDENT_AMBULATORY_CARE_PROVIDER_SITE_OTHER): Payer: Self-pay

## 2015-10-17 DIAGNOSIS — I472 Ventricular tachycardia: Secondary | ICD-10-CM | POA: Insufficient documentation

## 2015-10-17 DIAGNOSIS — I10 Essential (primary) hypertension: Secondary | ICD-10-CM | POA: Insufficient documentation

## 2015-10-17 DIAGNOSIS — I2510 Atherosclerotic heart disease of native coronary artery without angina pectoris: Secondary | ICD-10-CM

## 2015-10-18 ENCOUNTER — Inpatient Hospital Stay
Admission: RE | Admit: 2015-10-18 | Discharge: 2015-10-18 | Disposition: A | Discharge status: Home / Self Care | Payer: BLUE CROSS/BLUE SHIELD | Source: Ambulatory Visit | Attending: Cardiovascular Disease | Admitting: Cardiovascular Disease

## 2015-10-18 ENCOUNTER — Other Ambulatory Visit (INDEPENDENT_AMBULATORY_CARE_PROVIDER_SITE_OTHER): Payer: Self-pay

## 2015-10-18 DIAGNOSIS — R0600 Dyspnea, unspecified: Secondary | ICD-10-CM | POA: Insufficient documentation

## 2015-10-18 DIAGNOSIS — I2510 Atherosclerotic heart disease of native coronary artery without angina pectoris: Secondary | ICD-10-CM | POA: Insufficient documentation

## 2015-10-18 MED ORDER — TECHNETIUM TC 99M TETROFOSMIN INJECTION
1.0000 | Freq: Once | Status: AC | PRN
Start: 2015-10-18 — End: 2015-10-18
  Administered 2015-10-18: 1 via INTRAVENOUS

## 2015-10-18 MED ORDER — REGADENOSON 0.4 MG/5ML IV SOLN
0.4000 mg | Freq: Once | INTRAVENOUS | Status: AC | PRN
Start: 2015-10-18 — End: 2015-10-18
  Administered 2015-10-18: 0.4 mg via INTRAVENOUS
  Filled 2015-10-18: qty 5

## 2015-10-20 LAB — VAHRT HISTORIC LVEF: Ejection Fraction: 60 %

## 2015-10-21 ENCOUNTER — Ambulatory Visit (INDEPENDENT_AMBULATORY_CARE_PROVIDER_SITE_OTHER): Payer: Self-pay | Admitting: Nurse Practitioner

## 2015-11-22 ENCOUNTER — Ambulatory Visit (INDEPENDENT_AMBULATORY_CARE_PROVIDER_SITE_OTHER): Payer: Self-pay | Admitting: Cardiovascular Disease

## 2015-11-22 LAB — VAHRT HISTORIC LVEF: Ejection Fraction: 60 %

## 2015-11-23 HISTORY — PX: SKIN BIOPSY: SHX1

## 2015-12-01 ENCOUNTER — Inpatient Hospital Stay: Payer: BLUE CROSS/BLUE SHIELD | Admitting: Physical Therapist

## 2015-12-01 ENCOUNTER — Ambulatory Visit (INDEPENDENT_AMBULATORY_CARE_PROVIDER_SITE_OTHER): Payer: BLUE CROSS/BLUE SHIELD

## 2015-12-01 ENCOUNTER — Ambulatory Visit (INDEPENDENT_AMBULATORY_CARE_PROVIDER_SITE_OTHER): Payer: BLUE CROSS/BLUE SHIELD | Admitting: Orthopaedic Surgery

## 2015-12-01 ENCOUNTER — Encounter (INDEPENDENT_AMBULATORY_CARE_PROVIDER_SITE_OTHER): Payer: Self-pay | Admitting: Orthopaedic Surgery

## 2015-12-01 ENCOUNTER — Other Ambulatory Visit: Payer: Self-pay

## 2015-12-01 VITALS — BP 159/86 | HR 77 | Ht 71.0 in | Wt 212.0 lb

## 2015-12-01 DIAGNOSIS — M25561 Pain in right knee: Secondary | ICD-10-CM

## 2015-12-01 DIAGNOSIS — M5416 Radiculopathy, lumbar region: Secondary | ICD-10-CM

## 2015-12-01 NOTE — Progress Notes (Signed)
CHIEF COMPLAINT: right knee pain.    HISTORY OF PRESENT ILLNESS: Kent Jordan is a 65 y.o. year old male who presents with a chief complaint of right knee pain that began 3 days ago.    This happened while he was walking up the stairs.  He felt a pop in his knee.  The pain is located posterior and medial/anterior and is described as sharp. He rates the pain at 6 out of 10 and this increases with weight bearing, stairs       Past Medical History   Diagnosis Date   . Back pain    . Hypertensive disorder    . High cholesterol    . Failed back syndrome    . Pneumonia 11/2009   . Bilateral cataracts      removed 2009   . Sleep apnea      Does not use CPAP   . Neuropathy      Tingling left foot, numbness left toes   . Low back pain    . Difficulty in walking(719.7)      Uses cane to ambulate   . Elevated liver function tests    . 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 Psoriasis    . Arthritis      psoriatic and osteoarthritis      Past Surgical History   Procedure Laterality Date   . Lumbar fusion  10/2011   . Microdiscectomy lumbar  11/12     patient had it done 10/12 11/12   . Knee arthrocentesis  1983     right   . Abdominal surgery  03/1971     double hernia repair   . Eye surgery  2009     cataract removal, both eyes    . Back surgery  9/12, 11/12, 2/13     + L-3 to S-1 fusion   . Fracture surgery  1993     right middle finger   . Insertion, spinal cord stimulator generator N/A 05/07/2014     Procedure: DORSAL COLUMN STIMULATOR PLACEMENT;  Surgeon: Tera Helper, MD;  Location: [facility];  Service: Neurosurgery;  Laterality: N/A;  DCS PLACEMENT   . Septoplasty  10/05/2015     Current Outpatient Prescriptions on File Prior to Visit   Medication Sig Dispense Refill   . acetaminophen (TYLENOL) 325 MG tablet Take 2 tablets (650 mg total) by mouth every 6 (six) hours as needed for Pain or Fever. 30 tablet 0   . adalimumab (HUMIRA) 40 MG/0.8ML injection Inject 40 mg into the skin every 14  (fourteen) days. Last injection 04/29/14       . calcipotriene (DOVONOX) 0.005 % cream Apply topically 2 (two) times daily.     . ciclopirox (LOPROX) 0.77 % cream Apply topically 2 (two) times daily.     . clobetasol (TEMOVATE) 0.05 % cream Apply topically 2 (two) times daily.     Marland Kitchen co-enzyme Q-10 30 MG capsule Take 30 mg by mouth 3 (three) times daily.     . cyclobenzaprine (FLEXERIL) 10 MG tablet Take 10 mg by mouth 3 (three) times daily as needed for Muscle spasms.     . diazepam (VALIUM) 5 MG tablet Take 5 mg by mouth every 6 (six) hours as needed for Anxiety.     . DULoxetine (CYMBALTA) 60 MG capsule Take 60 mg by mouth daily.     . folic acid (FOLVITE) 400 MCG tablet Take  400 mcg by mouth daily.     Marland Kitchen glipiZIDE (GLUCOTROL) 10 MG tablet Take 10 mg by mouth 2 (two) times daily before meals.     . Lidocaine-Menthol 4-5 % Patch Apply topically.     Marland Kitchen oxyCODONE-acetaminophen (PERCOCET) 5-325 MG per tablet Take 1 tablet by mouth every 4 (four) hours as needed for Pain (Take 1-2 tabs every 4-6 hrs as needed for pain). 100 tablet 0   . rosuvastatin (CRESTOR) 5 MG tablet Take 5 mg by mouth daily.     . Tapentadol HCl (NUCYNTA ER) 200 MG Tablet SR 12 hr Take 1 tablet by mouth 2 (two) times daily. 150mg  2x daily       . ZETIA 10 MG tablet Take 10 mg by mouth daily.        . [DISCONTINUED] valsartan (DIOVAN) 80 MG tablet Take 80 mg by mouth daily.       No current facility-administered medications on file prior to visit.     Allergies   Allergen Reactions   . Angiotensin Receptor Blockers Other (See Comments)     ARB combined with Celebrex caused Arrhythmia due to high K+   . Ciprofloxacin Swelling   . Ace Inhibitors Angioedema   . Gabapentin Other (See Comments)     blurred vision, dizziness     Social History     Social History   . Marital Status: Single     Spouse Name: N/A   . Number of Children: N/A   . Years of Education: N/A     Occupational History   . Not on file.     Social History Main Topics   . Smoking status:  Former Smoker -- 1.00 packs/day for 41 years     Types: Cigarettes     Quit date: 09/26/2011   . Smokeless tobacco: Never Used   . Alcohol Use: 1.2 oz/week     2 Cans of beer per week   . Drug Use: No   . Sexual Activity:     Partners: Female     Pharmacist, hospital Protection: Surgical     Other Topics Concern   . Not on file     Social History Narrative     Family History   Problem Relation Age of Onset   . Heart disease Mother    . Heart disease Father        REVIEW OF SYSTEMS: History obtained from chart review and the patient.  General: negative for chills, fever or night sweats  Hematological and Lymphatic: negative for bleeding problems, jaundice or pallor  Endocrine: negative for hot flashes, malaise/lethargy or mood swings  Respiratory: negative for cough, shortness of breath or wheezing  Cardiovascular: negative for chest pain, LOC or SOB  Gastrointestinal: negative for abdominal pain, appetite loss or nausea/vomiting  Musculoskeletal: positive for knee pain as listed in the history of present illness.    Filed Vitals:    12/01/15 1048   BP: 159/86   Pulse: 77   Height: 1.803 m (5\' 11" )   Weight: 96.163 kg (212 lb)        Kent Jordan is a well appearing male who appears the stated age and is in no acute distress.    Left knee examination (unaffected limb) is normal with full range of motion, ligamentous stability, and no tenderness to palpation. There are no skin lesions or rashes. Distal pulses are 2+ and there is brisk capillary refill on all five toes. There is no  lymphadenopathy, effusion, erythema or soft tissue swelling. Strength is 5/5 throughout the lower extremity and sensation is intact to light touch throughout. Patellar exam reveals 2 quadrant medial patellar glide, 2 quadrant lateral patellar glide, grade 1 crepitation, and negative patellar apprehension.  Range of motion is noted to be 0-130.    Right knee examination (affected limb) reveals the following: varus alignment, there is no knee  effusion and a(n) Antalgic gait is noted.  McMurray's is negative. There is no quadriceps atrophy noted on exam when compared to the contralateral side. The patient is able to perform a straight leg raise. There is tenderness to palpation over the medial femoral condyle, medial joint line. There is no tenderness to palpation over the lateral joint line.  Sensation is intact to light touch sensation throughout. Manual muscle testing reveals knee extension: 5/5 and flexion: 5/5.  Patellar exam reveals 2 quadrant medial patellar glide, 2 quadrant lateral patellar glide, grade 1 crepitation, and there is no patellar apprehension. Popliteal angle is 30 degrees. Range of motion is noted to be 0-130.  Ligamentous testing reveals a grade 1A Lachman, negative anterior drawer, negative posterior drawer, grade 0 valgus instability in full extension, grade 0 valgus instability in 20 deg of flexion, grade 0 varus instability in full extension, grade 0 varus instability in 20 degrees of flexion.     Xrays performed at [facility] today of the right knee(s) (AP, lateral, flexion weightbearing, and sunrise views) demonstrate mild degenerative changes of her patellofemoral joint.    My impression is that Kent Jordan is a(n) 65 y.o. year old male with right knee pain that is improving.  I am unclear as to his diagnosis.  Normally I would order an MRI to get a better handle on it.  However, he does have a spinal cord stimulator or implant in his back that precludes him from getting an MRI and therefore because of this and the fact that he is improving, we will consider tinea with nonoperative management, which consists of nonsteroidal anti-inflammatory drugs as well as physical therapy.  I will see him back in 2 weeks, check on his progress.    No referring provider defined for this encounter.

## 2015-12-05 ENCOUNTER — Ambulatory Visit
Admission: RE | Admit: 2015-12-05 | Discharge: 2015-12-05 | Disposition: A | Discharge status: Home / Self Care | Payer: BLUE CROSS/BLUE SHIELD | Source: Ambulatory Visit

## 2015-12-05 DIAGNOSIS — M5416 Radiculopathy, lumbar region: Secondary | ICD-10-CM | POA: Insufficient documentation

## 2015-12-12 ENCOUNTER — Encounter (INDEPENDENT_AMBULATORY_CARE_PROVIDER_SITE_OTHER): Payer: Self-pay | Admitting: Orthopaedic Surgery

## 2015-12-12 ENCOUNTER — Ambulatory Visit (INDEPENDENT_AMBULATORY_CARE_PROVIDER_SITE_OTHER): Payer: BLUE CROSS/BLUE SHIELD | Admitting: Orthopaedic Surgery

## 2015-12-12 VITALS — BP 172/83 | HR 89 | Ht 71.0 in | Wt 212.0 lb

## 2015-12-12 DIAGNOSIS — M1711 Unilateral primary osteoarthritis, right knee: Secondary | ICD-10-CM

## 2015-12-12 DIAGNOSIS — M25461 Effusion, right knee: Secondary | ICD-10-CM | POA: Insufficient documentation

## 2015-12-12 MED ORDER — METHYLPREDNISOLONE ACETATE 40 MG/ML IJ SUSP
40.0000 mg | Freq: Once | INTRAMUSCULAR | Status: AC
Start: 2015-12-12 — End: 2015-12-12
  Administered 2015-12-12: 40 mg via INTRA_ARTICULAR

## 2015-12-12 NOTE — Progress Notes (Signed)
Kent Jordan is a 65 y.o. male who returns today after being seen recently for his right knee.  His symptoms are worse  He has pain with standing  He also has pain with bending past 90 degrees.  Going down stairs is worse  The symptoms began a few weeks and have not improved since the his last visit.      His medical history, medications and allergies were reviewed and updated in EPIC.    REVIEW OF SYSTEMS: History obtained from chart review and the patient.  General: negative for chills, fever or night sweats  Hematological and Lymphatic: negative for bleeding problems, jaundice or pallor  Endocrine: negative for hot flashes, malaise/lethargy or mood swings  Respiratory: negative for cough, shortness of breath or wheezing  Cardiovascular: negative for chest pain, LOC or SOB  Gastrointestinal: negative for abdominal pain, appetite loss or nausea/vomiting  Musculoskeletal: positive for knee pain as listed in the history of present illness.      PHYSICAL EXAM  On physical examination, Kent Jordan is a well-appearing male who appears his stated age and is in no acute distress, he is alert and oriented to person, place and time.     Left knee examination (unaffected limb) is normal with full range of motion, ligamentous stability, and no tenderness to palpation. There are no skin lesions or rashes. Distal pulses are 2+ and there is brisk capillary refill on all five toes. There is no lymphadenopathy, effusion, erythema or soft tissue swelling. Strength is 5/5 throughout the lower extremity and sensation is intact to light touch throughout. Patellar exam reveals 2 quadrant medial patellar glide, 2 quadrant lateral patellar glide, grade 2 crepitation, and negative patellar apprehension.  Range of motion is noted to be 0-130.    Right knee examination (affected limb) reveals the following: varus alignment, there is a moderate knee effusion and a(n) Antalgic gait is noted.  McMurray's is negative. There is no  quadriceps atrophy noted on exam when compared to the contralateral side. The patient is able to perform a straight leg raise. There is tenderness to palpation over the lateral and medial joint line.   Sensation is intact to light touch sensation throughout. Manual muscle testing reveals knee extension: 5/5 and flexion: 5/5.  Patellar exam reveals 2 quadrant medial patellar glide, 2 quadrant lateral patellar glide, grade 1 crepitation, and there is no patellar apprehension. Popliteal angle is 30 degrees. Range of motion is noted to be 0-130.  Ligamentous testing reveals a grade 1A Lachman, negative anterior drawer, negative posterior drawer, grade 0 valgus instability in full extension, grade 0 valgus instability in 20 deg of flexion, grade 0 varus instability in full extension, grade 0 varus instability in 20 degrees of flexion.     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  I reviewed the findings with the patient.  For now, we will start with a CT scan of his right knee as he is unable to obtain an MRI.  We also discussed the use of an aspiration followed by cortisone injection.  The patient was amenable to this.    After informed consent obtained:  Under sterile conditions, the subcutaneous superolateral portal was injected with 5 cc 1% lidocaine.   Then the right knee was aspirated and 30 cc of straw colored aspirate was withdrawn.  Utilizing the same portal, 5 mL 1 percent lidocaine and 1 mL 40 Mg/ Kg Depo-Medrol was reinjected into the knee He  tolerated this procedure well.

## 2015-12-13 ENCOUNTER — Ambulatory Visit
Admission: RE | Admit: 2015-12-13 | Discharge: 2015-12-13 | Disposition: A | Discharge status: Home / Self Care | Payer: BLUE CROSS/BLUE SHIELD | Source: Ambulatory Visit | Attending: Orthopaedic Surgery | Admitting: Orthopaedic Surgery

## 2015-12-13 DIAGNOSIS — M25461 Effusion, right knee: Secondary | ICD-10-CM | POA: Insufficient documentation

## 2015-12-13 DIAGNOSIS — M1711 Unilateral primary osteoarthritis, right knee: Secondary | ICD-10-CM | POA: Insufficient documentation

## 2015-12-15 ENCOUNTER — Ambulatory Visit (INDEPENDENT_AMBULATORY_CARE_PROVIDER_SITE_OTHER): Payer: BLUE CROSS/BLUE SHIELD | Admitting: Orthopaedic Surgery

## 2015-12-19 ENCOUNTER — Ambulatory Visit (INDEPENDENT_AMBULATORY_CARE_PROVIDER_SITE_OTHER): Payer: BLUE CROSS/BLUE SHIELD | Admitting: Orthopaedic Surgery

## 2015-12-19 ENCOUNTER — Encounter (INDEPENDENT_AMBULATORY_CARE_PROVIDER_SITE_OTHER): Payer: Self-pay | Admitting: Orthopaedic Surgery

## 2015-12-19 VITALS — Temp 98.6°F | Ht 71.0 in | Wt 215.0 lb

## 2015-12-19 DIAGNOSIS — E875 Hyperkalemia: Secondary | ICD-10-CM

## 2015-12-19 DIAGNOSIS — M25461 Effusion, right knee: Secondary | ICD-10-CM

## 2015-12-20 MED ORDER — METHYLPREDNISOLONE ACETATE 40 MG/ML IJ SUSP
40.0000 mg | Freq: Once | INTRAMUSCULAR | Status: AC
Start: 2015-12-20 — End: 2015-12-19
  Administered 2015-12-19: 40 mg via INTRA_ARTICULAR

## 2015-12-20 NOTE — Progress Notes (Signed)
HISTORY:  Kent Jordan is here today for follow up.    Overall, his right knee-  The pain is still unchanged.  The pain is located over the medial proximal tibia.  He rates at 8 out of 10 in nature.  Sharp and difficult walking.  He did get a CT scan to discuss results of that.  Unable to get an MRI because of a previous history of a spinal cord stimulator     REVIEW OF SYSTEMS: History obtained from chart review and the patient.  General: negative for chills, fever or night sweats  Hematological and Lymphatic: negative for bleeding problems, jaundice or pallor  Endocrine: negative for hot flashes, malaise/lethargy or mood swings  Respiratory: negative for cough, shortness of breath or wheezing  Cardiovascular: negative for chest pain, LOC or SOB  Gastrointestinal: negative for abdominal pain, appetite loss or nausea/vomiting  Musculoskeletal: positive for leg pain as listed in the history of present illness.    PHYSICAL EXAMINATION    Filed Vitals:    12/19/15 1400   Temp: 98.6 F (37 C)       General: Alert, oriented, pleasant throughout the evaluation  Neck: Supple  Chest: Symmetric rise and fall  CV: regular rate and rhythm via radial pulse examination  Abdomen: Soft non tender, non distended  Extremities:  Left knee evaluation shows no gross deformity.  He is exquisitely tender to palpation over the proximal medial tibia.  Even to light touch.  This is extremely tender.  He also has tenderness over the medial joint line as well as the medial femoral condyle.  However, the proximal medial tibia is the worst.  He has a extremely antalgic gait.  Minimal effusion noted today.  His ligaments are otherwise stable.    IMAGING STUDIES  CT of the left knee was reviewed.  There is moderate degenerative changes noted.  Difficult to evaluate the meniscus.  Given the fact that it is a CT scan.  No fractures are noted as well.    IMPRESSION: Left knee pain    PLAN: I am unclear of what is going on with his knee.  He  certainly has tenderness over the presence arm bursa.  Medial joint line and proximal medial femoral condyle.  However, the majority located over the pes anserine bursa.  Therefore, we attempted an injection into that area with 5 mL of 1 percent lidocaine.  He had 40 percent relief of pain.  Therefore, I injected 1 mL of 40 mg/kg Depo-Medrol into that area as well.  I will see him back in 4-6 weeks to check on his progress.  I am not quite sure what the next step is, as he cannot obtain an MRI.  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  We may consider getting a bone scan done instead.  All questions were answered

## 2015-12-26 ENCOUNTER — Inpatient Hospital Stay: Payer: BLUE CROSS/BLUE SHIELD

## 2015-12-29 ENCOUNTER — Inpatient Hospital Stay: Payer: BLUE CROSS/BLUE SHIELD

## 2016-01-03 ENCOUNTER — Inpatient Hospital Stay: Payer: BLUE CROSS/BLUE SHIELD | Admitting: Physical Therapist

## 2016-01-05 ENCOUNTER — Ambulatory Visit (INDEPENDENT_AMBULATORY_CARE_PROVIDER_SITE_OTHER): Payer: BLUE CROSS/BLUE SHIELD | Admitting: Orthopaedic Surgery

## 2016-01-05 ENCOUNTER — Encounter (INDEPENDENT_AMBULATORY_CARE_PROVIDER_SITE_OTHER): Payer: Self-pay | Admitting: Orthopaedic Surgery

## 2016-01-05 ENCOUNTER — Inpatient Hospital Stay: Payer: BLUE CROSS/BLUE SHIELD | Admitting: Physical Therapist

## 2016-01-05 VITALS — BP 147/76 | HR 84 | Temp 98.9°F | Ht 71.0 in | Wt 215.0 lb

## 2016-01-05 DIAGNOSIS — M25461 Effusion, right knee: Secondary | ICD-10-CM

## 2016-01-05 MED ORDER — METHYLPREDNISOLONE ACETATE 80 MG/ML IJ SUSP
80.0000 mg | Freq: Once | INTRAMUSCULAR | Status: DC
Start: 2016-01-05 — End: 2016-01-05

## 2016-01-05 MED ORDER — METHYLPREDNISOLONE ACETATE 40 MG/ML IJ SUSP
40.0000 mg | Freq: Once | INTRAMUSCULAR | Status: AC
Start: 2016-01-05 — End: 2016-01-05
  Administered 2016-01-05: 40 mg via INTRA_ARTICULAR

## 2016-01-05 NOTE — Addendum Note (Signed)
Addended by: Rush Farmer on: 01/05/2016 04:40 PM     Modules accepted: Orders

## 2016-01-05 NOTE — Progress Notes (Signed)
Kent Jordan is a 65 y.o. male who returns today after being seen recently for his right knee.  The injection I gave him over his presence or bursa has helped him significantly.  However, he still has significant pain over the medial aspect of his knee, especially with weightbearing and flexion.    His medical history, medications and allergies were reviewed and updated in EPIC.    REVIEW OF SYSTEMS: History obtained from chart review and the patient.  General: negative for chills, fever or night sweats  Hematological and Lymphatic: negative for bleeding problems, jaundice or pallor  Endocrine: negative for hot flashes, malaise/lethargy or mood swings  Respiratory: negative for cough, shortness of breath or wheezing  Cardiovascular: negative for chest pain, LOC or SOB  Gastrointestinal: negative for abdominal pain, appetite loss or nausea/vomiting  Musculoskeletal: positive for knee pain as listed in the history of present illness.      PHYSICAL EXAM  On physical examination, Kent Jordan is a well-appearing male who appears his stated age and is in no acute distress, he is alert and oriented to person, place and time.     Left knee examination (unaffected limb) is normal with full range of motion, ligamentous stability, and no tenderness to palpation. There are no skin lesions or rashes. Distal pulses are 2+ and there is brisk capillary refill on all five toes. There is no lymphadenopathy, effusion, erythema or soft tissue swelling. Strength is 5/5 throughout the lower extremity and sensation is intact to light touch throughout. Patellar exam reveals 2 quadrant medial patellar glide, 2 quadrant lateral patellar glide, grade 2 crepitation, and negative patellar apprehension.  Range of motion is noted to be 0-130.    Right knee examination (affected limb) reveals the following: varus alignment, there is no knee effusion and a(n) Antalgic gait is noted.  McMurray's is negative. There is no quadriceps atrophy noted  on exam when compared to the contralateral side. The patient is able to perform a straight leg raise. There is tenderness to palpation over the medial joint line and medial femoral condyle. There is no tenderness to palpation over the lateral femroal condyle.  Sensation is intact to light touch sensation throughout. Manual muscle testing reveals knee extension: 5/5 and flexion: 5/5.  Patellar exam reveals 2 quadrant medial patellar glide, 2 quadrant lateral patellar glide, grade 2 crepitation, and there is no patellar apprehension. Popliteal angle is 30\ degrees. Range of motion is noted to be 0-130.  Ligamentous testing reveals a grade 1A Lachman, negative anterior drawer, negative posterior drawer, grade 0 valgus instability in full extension, grade 0 valgus instability in 20 deg of flexion, grade 0 varus instability in full extension, grade 0 varus instability in 20 degrees of flexion.     My impression at this time is right knee pain.  I am not quite sure what to make of his right knee injury.  His CT demonstrate mild degenerative changes, possible medial meniscus tear and for today's not get able to get an MRI because of his spinal cord stimulator.  We discussed a possible intra-articular cortisone injection as he states today much of his pain is underneath his patella.  I think this is a reasonable idea.    Under sterile conditions, a mixture of 5cc's of 1% lidocaine and 1 cc's of 80 mg/kg Depomedrol were injected under sterile conditions into the right knee via the inferolateral portal. Post injection instructions were given. He tolerated this procedure well.  I will see him back in 6 weeks.  If he is no better, we may consider diagnostic arthroscopy.  I did warn him that this is not ideal, as we do not have a game plan going in and, therefore, I am unable to predict exactly how he will respond to operative treatment.Marland Kitchen

## 2016-01-10 ENCOUNTER — Inpatient Hospital Stay: Payer: BLUE CROSS/BLUE SHIELD | Admitting: Physical Therapist

## 2016-01-12 ENCOUNTER — Inpatient Hospital Stay: Payer: BLUE CROSS/BLUE SHIELD | Admitting: Physical Therapist

## 2016-01-17 ENCOUNTER — Inpatient Hospital Stay: Payer: BLUE CROSS/BLUE SHIELD | Admitting: Physical Therapist

## 2016-01-19 ENCOUNTER — Inpatient Hospital Stay: Payer: BLUE CROSS/BLUE SHIELD | Admitting: Physical Therapist

## 2016-02-02 ENCOUNTER — Encounter (INDEPENDENT_AMBULATORY_CARE_PROVIDER_SITE_OTHER): Payer: Self-pay | Admitting: Orthopaedic Surgery

## 2016-02-02 ENCOUNTER — Ambulatory Visit (INDEPENDENT_AMBULATORY_CARE_PROVIDER_SITE_OTHER): Payer: BLUE CROSS/BLUE SHIELD | Admitting: Orthopaedic Surgery

## 2016-02-02 VITALS — Temp 98.6°F | Ht 71.0 in | Wt 215.0 lb

## 2016-02-02 DIAGNOSIS — M25561 Pain in right knee: Secondary | ICD-10-CM

## 2016-02-02 DIAGNOSIS — M25461 Effusion, right knee: Secondary | ICD-10-CM

## 2016-02-02 MED ORDER — RISEDRONATE SODIUM 35 MG PO TABS
35.0000 mg | ORAL_TABLET | ORAL | Status: AC
Start: 2016-02-02 — End: 2016-02-08

## 2016-02-02 NOTE — Progress Notes (Signed)
Kent Jordan is a 65 y.o. male who returns today after being seen recently for his right knee.  He's happy to report that he's 50% better  He still has some pain with squatting, pivoting.  Pain is located over the medial joint line and medial femoral condyle    His medical history, medications and allergies were reviewed and updated in EPIC.    REVIEW OF SYSTEMS: History obtained from chart review and the patient.  General: negative for chills, fever or night sweats  Hematological and Lymphatic: negative for bleeding problems, jaundice or pallor  Endocrine: negative for hot flashes, malaise/lethargy or mood swings  Respiratory: negative for cough, shortness of breath or wheezing  Cardiovascular: negative for chest pain, LOC or SOB  Gastrointestinal: negative for abdominal pain, appetite loss or nausea/vomiting  Musculoskeletal: positive for knee pain as listed in the history of present illness.      PHYSICAL EXAM  On physical examination, Kent Jordan is a well-appearing male who appears his stated age and is in no acute distress, he is alert and oriented to person, place and time.     Left knee examination (unaffected limb) is normal with full range of motion, ligamentous stability, and no tenderness to palpation. There are no skin lesions or rashes. Distal pulses are 2+ and there is brisk capillary refill on all five toes. There is no lymphadenopathy, effusion, erythema or soft tissue swelling. Strength is 5/5 throughout the lower extremity and sensation is intact to light touch throughout. Patellar exam reveals 2 quadrant medial patellar glide, 2 quadrant lateral patellar glide, grade 2 crepitation, and negative patellar apprehension.  Range of motion is noted to be 0-130.    Right knee examination (affected limb) reveals the following: there is no knee effusion and a(n) Antalgic gait is noted.  McMurray's is negative. There is no quadriceps atrophy noted on exam when compared to the contralateral side. The  patient is able to perform a straight leg raise. There is tenderness to palpation over the medial femoral condyle. There is no tenderness to palpation over the lateral joint line.  Sensation is intact to light touch sensation throughout. Manual muscle testing reveals knee extension: 5/5 and flexion: 5/5.  Patellar exam reveals 2 quadrant medial patellar glide, 2 quadrant lateral patellar glide, grade 2 crepitation, and there is no patellar apprehension. Popliteal angle is 30 degrees. Range of motion is noted to be 0-130.  Ligamentous testing reveals a grade 1A Lachman, negative anterior drawer, negative posterior drawer, grade 0 valgus instability in full extension, grade 0 valgus instability in 20 deg of flexion, grade 0 varus instability in full extension, grade 0 varus instability in 20 degrees of flexion.     My impression at this time is right knee medial sided pain.  At this time, we will start with a bone scan to rule any bony pathology.  We are unable to obtain an MRI, which would be my preferred imaging modality.  I will also start him on Actonel as he may potentially have spontaneous osteonecrosis of the knee given the fact that he has medial femoral condyle tenderness.  I will see him back in 2 months for follow-up.  He is no better at that time, we will consider diagnostic arthroscopy.

## 2016-02-03 ENCOUNTER — Telehealth (INDEPENDENT_AMBULATORY_CARE_PROVIDER_SITE_OTHER): Payer: Self-pay

## 2016-02-03 NOTE — Telephone Encounter (Signed)
[name] called to request clarification on his new Actonel medication.  We discussed that he would remain upright and take medication with water on an empty stomach.  He wanted clarification on the label from from the pharmacy which said to take the medication after breakfast.  I called the [facility] pharmacist on duty at [facility] and he explained that the reason Dr. Cherly Hensen included to lie down was that this medication may cause drowsiness or dizziness. The pharmacist also stated he did not label the medication to be taken after breakfast and he included the label exactly are per Dr. Nelta Numbers orders.  I returned the call to [name] to explain he should take the medication on an empty stomach in the AM, and he may sit down to avoid dizziness and to ensure he does not get any stomach upset.  [name] confirmed understanding and will take his first dose tomorrow AM. EP

## 2016-02-08 ENCOUNTER — Ambulatory Visit
Admission: RE | Admit: 2016-02-08 | Discharge: 2016-02-08 | Disposition: A | Discharge status: Home / Self Care | Payer: BLUE CROSS/BLUE SHIELD | Source: Ambulatory Visit | Attending: Orthopaedic Surgery | Admitting: Orthopaedic Surgery

## 2016-02-08 ENCOUNTER — Other Ambulatory Visit (INDEPENDENT_AMBULATORY_CARE_PROVIDER_SITE_OTHER): Payer: Self-pay

## 2016-02-08 ENCOUNTER — Other Ambulatory Visit (INDEPENDENT_AMBULATORY_CARE_PROVIDER_SITE_OTHER): Payer: Self-pay | Admitting: Orthopaedic Surgery

## 2016-02-08 DIAGNOSIS — M25561 Pain in right knee: Secondary | ICD-10-CM

## 2016-02-08 DIAGNOSIS — M25461 Effusion, right knee: Secondary | ICD-10-CM

## 2016-02-08 DIAGNOSIS — M94279 Chondromalacia, unspecified ankle and joints of foot: Secondary | ICD-10-CM | POA: Insufficient documentation

## 2016-02-08 MED ORDER — TECHNETIUM TC 99M MEDRONATE INJECTION
25.0000 | Freq: Once | Status: AC | PRN
Start: 2016-02-08 — End: 2016-02-08
  Administered 2016-02-08: 25 via INTRAVENOUS

## 2016-02-09 ENCOUNTER — Other Ambulatory Visit: Payer: BLUE CROSS/BLUE SHIELD

## 2016-02-27 ENCOUNTER — Encounter (INDEPENDENT_AMBULATORY_CARE_PROVIDER_SITE_OTHER): Payer: Self-pay | Admitting: Orthopaedic Surgery

## 2016-02-27 ENCOUNTER — Ambulatory Visit (INDEPENDENT_AMBULATORY_CARE_PROVIDER_SITE_OTHER): Payer: BLUE CROSS/BLUE SHIELD | Admitting: Orthopaedic Surgery

## 2016-02-27 VITALS — BP 146/80 | HR 81 | Temp 98.5°F | Ht 71.0 in | Wt 215.0 lb

## 2016-02-27 DIAGNOSIS — S83241A Other tear of medial meniscus, current injury, right knee, initial encounter: Secondary | ICD-10-CM | POA: Insufficient documentation

## 2016-02-27 NOTE — Progress Notes (Signed)
Kent Jordan is a 65 y.o. male who returns today after being seen recently for right knee pain.  His symptoms are no better.  I then managing him for 3 months now.  He has CT scan demonstrate a possible meniscal tear.  He has a bone scan that shows uptake along the medial femoral condyle and medial tibial plateau.  His pain is predominantly medial.  He can hardly walk.  We are unable to get an MRI secondary to a spinal cord stimulator off.    His medical history, medications and allergies were reviewed and updated in EPIC.    REVIEW OF SYSTEMS: History obtained from chart review and the patient.  General: negative for chills, fever or night sweats  Hematological and Lymphatic: negative for bleeding problems, jaundice or pallor  Endocrine: negative for hot flashes, malaise/lethargy or mood swings  Respiratory: negative for cough, shortness of breath or wheezing  Cardiovascular: negative for chest pain, LOC or SOB  Gastrointestinal: negative for abdominal pain, appetite loss or nausea/vomiting  Musculoskeletal: positive for knee pain as listed in the history of present illness.      PHYSICAL EXAM  On physical examination, Kent Jordan is a well-appearing male who appears his stated age and is in no acute distress, he is alert and oriented to person, place and time.     Left knee examination (unaffected limb) is normal with full range of motion, ligamentous stability, and no tenderness to palpation. There are no skin lesions or rashes. Distal pulses are 2+ and there is brisk capillary refill on all five toes. There is no lymphadenopathy, effusion, erythema or soft tissue swelling. Strength is 5/5 throughout the lower extremity and sensation is intact to light touch throughout. Patellar exam reveals 2 quadrant medial patellar glide, 2 quadrant lateral patellar glide, grade 2 crepitation, and negative patellar apprehension.  Range of motion is noted to be 0-130.    Right knee examination (affected limb) reveals the  following: varus alignment, there is no knee effusion and a(n) Antalgic gait is noted.  McMurray's is negative. There is no quadriceps atrophy noted on exam when compared to the contralateral side. The patient is able to perform a straight leg raise. There is tenderness to palpation over the medial joint line. There is no tenderness to palpation over the lateral joint line.  Sensation is intact to light touch sensation throughout. Manual muscle testing reveals knee extension: 5/5 and flexion: 5/5.  Patellar exam reveals 2 quadrant medial patellar glide, 2 quadrant lateral patellar glide, grade 1 crepitation, and there is no patellar apprehension. Popliteal angle is 30 degrees. Range of motion is noted to be 0-120.  Ligamentous testing reveals a grade 1A Lachman, negative anterior drawer, negative posterior drawer, grade 0 valgus instability in full extension, grade 0 valgus instability in 20 deg of flexion, grade 0 varus instability in full extension, grade 0 varus instability in 20 degrees of flexion.     My impression at this time is right medial knee pain, possible meniscal tear.    I had a long discussion with him about the diagnosis and treatment options.  I am not exactly sure what is going on his right knee and, given the fact that he cannot obtain an MRI, we do not have a definitive diagnosis.  However, he has undergone physical therapy, multiple cortisone injections as well as bisphosphonate treatment for possible spontaneous osteonecrosis with limited benefit.  Therefore, we discussed the idea that knee arthroscopy with a possible meniscectomy.  This procedure will likely be more diagnostic than therapeutic.  However, he does have a medial meniscus tear, I will certainly address this. The risks and benefits of the treatment options were discussed and the plan is to proceed with knee arthroscopy with possible meniscectomy.  We discussed the risks which include but are not limited to bleeding, infection,  damage to neurovascular structures, knee stiffness, incomplete resolution of symptoms, need for further surgery, DVT, PE, and medical complications of anesthesia. After discussing the risks and benefits of the procedure, the patient expressed willingness to proceed. We will proceed with scheduling surgery and obtain the necessary pre-operative work-up. We will see the patient back for surgery.

## 2016-03-05 ENCOUNTER — Encounter (INDEPENDENT_AMBULATORY_CARE_PROVIDER_SITE_OTHER): Payer: Self-pay | Admitting: Orthopaedic Surgery

## 2016-03-05 ENCOUNTER — Ambulatory Visit (INDEPENDENT_AMBULATORY_CARE_PROVIDER_SITE_OTHER): Payer: BLUE CROSS/BLUE SHIELD | Admitting: Orthopaedic Surgery

## 2016-03-05 ENCOUNTER — Other Ambulatory Visit (INDEPENDENT_AMBULATORY_CARE_PROVIDER_SITE_OTHER): Payer: Self-pay | Admitting: Orthopaedic Surgery

## 2016-03-05 VITALS — BP 142/74 | HR 86 | Temp 98.8°F | Ht 71.0 in | Wt 215.0 lb

## 2016-03-05 DIAGNOSIS — M25461 Effusion, right knee: Secondary | ICD-10-CM

## 2016-03-05 NOTE — Progress Notes (Signed)
Kent Jordan is a 65 y.o. male who returns today after being seen recently for his right knee.  He is scheduled for an arthroscopic surgery in 2 weeks.  Unfortunately, his knee began to swell up with him over the past week.  This made it difficult to bend and walk up.  He is interested in having this aspirated today.    His medical history, medications and allergies were reviewed and updated in EPIC.    REVIEW OF SYSTEMS: History obtained from chart review and the patient.  General: negative for chills, fever or night sweats  Hematological and Lymphatic: negative for bleeding problems, jaundice or pallor  Endocrine: negative for hot flashes, malaise/lethargy or mood swings  Respiratory: negative for cough, shortness of breath or wheezing  Cardiovascular: negative for chest pain, LOC or SOB  Gastrointestinal: negative for abdominal pain, appetite loss or nausea/vomiting  Musculoskeletal: positive for knee pain as listed in the history of present illness.      PHYSICAL EXAM  On physical examination, Kent Jordan is a well-appearing male who appears his stated age and is in no acute distress, he is alert and oriented to person, place and time.     Left knee examination (unaffected limb) is normal with full range of motion, ligamentous stability, and no tenderness to palpation. There are no skin lesions or rashes. Distal pulses are 2+ and there is brisk capillary refill on all five toes. There is no lymphadenopathy, effusion, erythema or soft tissue swelling. Strength is 5/5 throughout the lower extremity and sensation is intact to light touch throughout. Patellar exam reveals 2 quadrant medial patellar glide, 2 quadrant lateral patellar glide, grade 2 crepitation, and negative patellar apprehension.  Range of motion is noted to be 0-130.    Right knee examination (affected limb) reveals the following: varus alignment, there is a large knee effusion and a(n) Antalgic gait is noted.  McMurray's is negative. There is  no quadriceps atrophy noted on exam when compared to the contralateral side. The patient is able to perform a straight leg raise. There is tenderness to palpation over the medial joint line. There is no tenderness to palpation over the lateral joint line.  Sensation is intact to light touch sensation throughout. Manual muscle testing reveals knee extension: 5/5 and flexion: 5/5.  Patellar exam reveals 2 quadrant medial patellar glide, 2 quadrant lateral patellar glide, grade 2 crepitation, and there is no patellar apprehension. Popliteal angle is 30 degrees. Range of motion is noted to be 0-80.  Ligamentous testing reveals a grade 1A Lachman, negative anterior drawer, negative posterior drawer, grade 0 valgus instability in full extension, grade 0 valgus instability in 20 deg of flexion, grade 0 varus instability in full extension, grade 0 varus instability in 20 degrees of flexion.     My impression at this time is symptomatic right knee effusion.     I reviewed the findings with the patient.  I do think an aspiration would be therapeutic.    Under sterile conditions, the subcutaneous superolateral portal was injected with 5 cc 1% lidocaine.   Then the right knee was aspirated and 60 cc of blood tinged aspirate was withdrawn. He tolerated this procedure well.  The fluid was sent off for analysis.  I will see him back in 2 weeks for surgery

## 2016-03-08 ENCOUNTER — Telehealth (INDEPENDENT_AMBULATORY_CARE_PROVIDER_SITE_OTHER): Payer: Self-pay

## 2016-03-08 NOTE — Telephone Encounter (Signed)
Kent Jordan called to report his knee has filled with fluid again, and he is in pain.  He offers to come back in or to receive pain medication, while he awaits surgery. I gave the message to Dr. Cherly Hensen, and returned the call to advise an OV.  Kent Jordan will see Dr. Cherly Hensen on 03/12/16. EP

## 2016-03-12 ENCOUNTER — Encounter (INDEPENDENT_AMBULATORY_CARE_PROVIDER_SITE_OTHER): Payer: Self-pay | Admitting: Orthopaedic Surgery

## 2016-03-12 ENCOUNTER — Ambulatory Visit (INDEPENDENT_AMBULATORY_CARE_PROVIDER_SITE_OTHER): Payer: BLUE CROSS/BLUE SHIELD | Admitting: Orthopaedic Surgery

## 2016-03-12 VITALS — BP 133/71 | HR 86 | Temp 98.4°F | Ht 71.0 in | Wt 215.0 lb

## 2016-03-12 DIAGNOSIS — M25461 Effusion, right knee: Secondary | ICD-10-CM

## 2016-03-12 DIAGNOSIS — S83241A Other tear of medial meniscus, current injury, right knee, initial encounter: Secondary | ICD-10-CM

## 2016-03-12 LAB — CULTURE + GRAM STAIN,AEROBIC, WOUND

## 2016-03-12 LAB — SYNOVIAL FLUID, CRYSTAL

## 2016-03-13 ENCOUNTER — Encounter (INDEPENDENT_AMBULATORY_CARE_PROVIDER_SITE_OTHER): Payer: Self-pay

## 2016-03-13 ENCOUNTER — Ambulatory Visit: Payer: BLUE CROSS/BLUE SHIELD

## 2016-03-13 NOTE — Progress Notes (Signed)
Kent Jordan is a 65 y.o. male who returns today after being seen recently for his right knee.  He is scheduled for surgery next week.  Unfortunately, his knee has swelled up again and has really cause him significant discomfort and disability.  He went to have it drained again today.  His medical history, medications and allergies were reviewed and updated in EPIC.    REVIEW OF SYSTEMS: History obtained from chart review and the patient.  General: negative for chills, fever or night sweats  Hematological and Lymphatic: negative for bleeding problems, jaundice or pallor  Endocrine: negative for hot flashes, malaise/lethargy or mood swings  Respiratory: negative for cough, shortness of breath or wheezing  Cardiovascular: negative for chest pain, LOC or SOB  Gastrointestinal: negative for abdominal pain, appetite loss or nausea/vomiting  Musculoskeletal: positive for knee pain as listed in the history of present illness.      PHYSICAL EXAM  On physical examination, Kent Jordan is a well-appearing male who appears his stated age and is in no acute distress, he is alert and oriented to person, place and time.     Left knee examination (unaffected limb) is normal with full range of motion, ligamentous stability, and no tenderness to palpation. There are no skin lesions or rashes. Distal pulses are 2+ and there is brisk capillary refill on all five toes. There is no lymphadenopathy, effusion, erythema or soft tissue swelling. Strength is 5/5 throughout the lower extremity and sensation is intact to light touch throughout. Patellar exam reveals 2 quadrant medial patellar glide, 2 quadrant lateral patellar glide, grade 2 crepitation, and negative patellar apprehension.  Range of motion is noted to be 0-130.    Right knee examination (affected limb) reveals the following: varus alignment, there is knee effusion and a(n) Antalgic gait is noted.  McMurray's is positive. There is no quadriceps atrophy noted on exam when  compared to the contralateral side. The patient is able to perform a straight leg raise. There is tenderness to palpation over the medial joint line. There is no tenderness to palpation over the lateral joint line.  Sensation is intact to light touch sensation throughout. Manual muscle testing reveals knee extension: 5/5 and flexion: 5/5.  Patellar exam reveals 2 quadrant medial patellar glide, 2 quadrant lateral patellar glide, grade 2 crepitation, and there is no patellar apprehension. Popliteal angle is 30 degrees. Range of motion is noted to be 0-120.  Ligamentous testing reveals a grade 01A Lachman, negative anterior drawer, negative posterior drawer, grade 0 valgus instability in full extension, grade 0 valgus instability in 20 deg of flexion, grade 0 varus instability in full extension, grade 0 varus instability in 20 degrees of flexion.     My impression at this time is right knee medial sided pain with a large effusion.  We will go ahead and do another knee aspiration today. Under sterile conditions, the subcutaneous superolateral portal was injected with 5 cc 1% lidocaine.   Then the right knee was aspirated and 40 cc of straw colored aspirate was withdrawn. He tolerated this procedure well.

## 2016-03-15 ENCOUNTER — Ambulatory Visit (INDEPENDENT_AMBULATORY_CARE_PROVIDER_SITE_OTHER): Payer: BLUE CROSS/BLUE SHIELD | Admitting: Orthopaedic Surgery

## 2016-03-21 ENCOUNTER — Ambulatory Visit
Admission: RE | Admit: 2016-03-21 | Discharge: 2016-03-21 | Disposition: A | Discharge status: Home / Self Care | Payer: BLUE CROSS/BLUE SHIELD | Source: Ambulatory Visit | Attending: Orthopaedic Surgery | Admitting: Orthopaedic Surgery

## 2016-03-21 ENCOUNTER — Ambulatory Visit: Payer: BLUE CROSS/BLUE SHIELD | Admitting: Anesthesiology

## 2016-03-21 ENCOUNTER — Encounter
Admission: RE | Disposition: A | Discharge status: Home / Self Care | Payer: Self-pay | Source: Ambulatory Visit | Attending: Orthopaedic Surgery

## 2016-03-21 ENCOUNTER — Ambulatory Visit: Payer: BLUE CROSS/BLUE SHIELD | Admitting: Orthopaedic Surgery

## 2016-03-21 ENCOUNTER — Ambulatory Visit: Payer: Self-pay

## 2016-03-21 DIAGNOSIS — M25461 Effusion, right knee: Secondary | ICD-10-CM | POA: Insufficient documentation

## 2016-03-21 DIAGNOSIS — Z87891 Personal history of nicotine dependence: Secondary | ICD-10-CM | POA: Insufficient documentation

## 2016-03-21 DIAGNOSIS — M23231 Derangement of other medial meniscus due to old tear or injury, right knee: Secondary | ICD-10-CM | POA: Insufficient documentation

## 2016-03-21 DIAGNOSIS — M67861 Other specified disorders of synovium, right knee: Secondary | ICD-10-CM

## 2016-03-21 DIAGNOSIS — M25561 Pain in right knee: Secondary | ICD-10-CM

## 2016-03-21 DIAGNOSIS — M65861 Other synovitis and tenosynovitis, right lower leg: Secondary | ICD-10-CM | POA: Insufficient documentation

## 2016-03-21 DIAGNOSIS — E119 Type 2 diabetes mellitus without complications: Secondary | ICD-10-CM | POA: Insufficient documentation

## 2016-03-21 DIAGNOSIS — G4730 Sleep apnea, unspecified: Secondary | ICD-10-CM | POA: Insufficient documentation

## 2016-03-21 DIAGNOSIS — M1711 Unilateral primary osteoarthritis, right knee: Secondary | ICD-10-CM | POA: Insufficient documentation

## 2016-03-21 DIAGNOSIS — L4050 Arthropathic psoriasis, unspecified: Secondary | ICD-10-CM | POA: Insufficient documentation

## 2016-03-21 DIAGNOSIS — I10 Essential (primary) hypertension: Secondary | ICD-10-CM | POA: Insufficient documentation

## 2016-03-21 DIAGNOSIS — S83241A Other tear of medial meniscus, current injury, right knee, initial encounter: Secondary | ICD-10-CM | POA: Diagnosis present

## 2016-03-21 DIAGNOSIS — E7800 Pure hypercholesterolemia, unspecified: Secondary | ICD-10-CM | POA: Insufficient documentation

## 2016-03-21 HISTORY — PX: ARTHROSCOPY, KNEE: SHX3174

## 2016-03-21 HISTORY — DX: Cardiac arrhythmia, unspecified: I49.9

## 2016-03-21 HISTORY — DX: Arthropathic psoriasis, unspecified: L40.50

## 2016-03-21 LAB — BODY FLUID CELL COUNT
Body Fluid Lymphocytes: 43 %
Body Fluid Monocyte/Macrophage Cells: 40 %
Body Fluid Polymorphonuclear Cell: 17 %
Body Fluid RBC: 9000 /mm3
Body Fluid WBC: 365 /mm3

## 2016-03-21 LAB — BODY FLUID PATH REVIEW-

## 2016-03-21 SURGERY — ARTHROSCOPY, KNEE
Anesthesia: Anesthesia General | Site: Knee | Laterality: Right | Wound class: Clean

## 2016-03-21 MED ORDER — CEFAZOLIN SODIUM-DEXTROSE 2-3 GM-% IV SOLR
INTRAVENOUS | Status: AC
Start: 2016-03-21 — End: ?
  Filled 2016-03-21: qty 50

## 2016-03-21 MED ORDER — OXYCODONE HCL 5 MG PO TABS
5.0000 mg | ORAL_TABLET | Freq: Once | ORAL | Status: DC | PRN
Start: 2016-03-21 — End: 2016-03-21

## 2016-03-21 MED ORDER — ASPIRIN 325 MG PO TBEC
325.0000 mg | DELAYED_RELEASE_TABLET | Freq: Every day | ORAL | Status: AC
Start: 2016-03-21 — End: 2016-04-11

## 2016-03-21 MED ORDER — FENTANYL CITRATE (PF) 50 MCG/ML IJ SOLN (WRAP)
INTRAMUSCULAR | Status: DC | PRN
Start: 2016-03-21 — End: 2016-03-21
  Administered 2016-03-21: 100 ug via INTRAVENOUS

## 2016-03-21 MED ORDER — OXYCODONE-ACETAMINOPHEN 5-325 MG PO TABS
1.0000 | ORAL_TABLET | Freq: Once | ORAL | Status: AC | PRN
Start: 2016-03-21 — End: 2016-03-21

## 2016-03-21 MED ORDER — MORPHINE SULFATE (PF) 1 MG/ML IJ SOLN
INTRAMUSCULAR | Status: AC
Start: 2016-03-21 — End: ?
  Filled 2016-03-21: qty 10

## 2016-03-21 MED ORDER — MIDAZOLAM HCL 2 MG/2ML IJ SOLN
INTRAMUSCULAR | Status: AC
Start: 2016-03-21 — End: ?
  Filled 2016-03-21: qty 2

## 2016-03-21 MED ORDER — EPINEPHRINE HCL 1 MG/ML IJ SOLN (WRAP)
Status: DC | PRN
Start: 2016-03-21 — End: 2016-03-21
  Administered 2016-03-21: 09:00:00 2 mg

## 2016-03-21 MED ORDER — OXYCODONE-ACETAMINOPHEN 5-325 MG PO TABS
ORAL_TABLET | ORAL | Status: AC
Start: 2016-03-21 — End: 2016-03-21
  Administered 2016-03-21: 1 via ORAL
  Filled 2016-03-21: qty 1

## 2016-03-21 MED ORDER — FENTANYL CITRATE (PF) 50 MCG/ML IJ SOLN (WRAP)
INTRAMUSCULAR | Status: AC
Start: 2016-03-21 — End: 2016-03-21
  Administered 2016-03-21: 25 ug via INTRAVENOUS
  Filled 2016-03-21: qty 2

## 2016-03-21 MED ORDER — HYDRALAZINE HCL 20 MG/ML IJ SOLN
INTRAMUSCULAR | Status: AC
Start: 2016-03-21 — End: ?
  Filled 2016-03-21: qty 1

## 2016-03-21 MED ORDER — PREGABALIN 150 MG PO CAPS
ORAL_CAPSULE | ORAL | Status: AC
Start: 2016-03-21 — End: ?
  Filled 2016-03-21: qty 1

## 2016-03-21 MED ORDER — ONDANSETRON HCL 4 MG/2ML IJ SOLN
4.0000 mg | Freq: Once | INTRAMUSCULAR | Status: DC | PRN
Start: 2016-03-21 — End: 2016-03-21

## 2016-03-21 MED ORDER — FENTANYL CITRATE (PF) 50 MCG/ML IJ SOLN (WRAP)
25.0000 ug | INTRAMUSCULAR | Status: AC | PRN
Start: 2016-03-21 — End: 2016-03-21
  Administered 2016-03-21 (×3): 25 ug via INTRAVENOUS

## 2016-03-21 MED ORDER — LIDOCAINE HCL (PF) 2 % IJ SOLN
INTRAMUSCULAR | Status: AC
Start: 2016-03-21 — End: ?
  Filled 2016-03-21: qty 5

## 2016-03-21 MED ORDER — FENTANYL CITRATE (PF) 50 MCG/ML IJ SOLN (WRAP)
INTRAMUSCULAR | Status: AC
Start: 2016-03-21 — End: ?
  Filled 2016-03-21: qty 2

## 2016-03-21 MED ORDER — PROPOFOL 10 MG/ML IV EMUL (WRAP)
INTRAVENOUS | Status: DC | PRN
Start: 2016-03-21 — End: 2016-03-21
  Administered 2016-03-21: 200 mg via INTRAVENOUS

## 2016-03-21 MED ORDER — HYDRALAZINE HCL 20 MG/ML IJ SOLN
INTRAMUSCULAR | Status: DC | PRN
Start: 2016-03-21 — End: 2016-03-21
  Administered 2016-03-21: 10 mg via INTRAVENOUS

## 2016-03-21 MED ORDER — BUPIVACAINE-EPINEPHRINE (PF) 0.5% -1:200000 IJ SOLN
INTRAMUSCULAR | Status: AC
Start: 2016-03-21 — End: ?
  Filled 2016-03-21: qty 30

## 2016-03-21 MED ORDER — MORPHINE SULFATE 10 MG/ML IJ SOLN
INTRAMUSCULAR | Status: DC | PRN
Start: 2016-03-21 — End: 2016-03-21
  Administered 2016-03-21: 3 mg via INTRAVENOUS
  Administered 2016-03-21: 4 mg via INTRAVENOUS

## 2016-03-21 MED ORDER — PROMETHAZINE HCL 25 MG/ML IJ SOLN
6.2500 mg | Freq: Once | INTRAMUSCULAR | Status: DC | PRN
Start: 2016-03-21 — End: 2016-03-21

## 2016-03-21 MED ORDER — OXYCODONE HCL ER 10 MG PO T12A
EXTENDED_RELEASE_TABLET | ORAL | Status: AC
Start: 2016-03-21 — End: ?
  Filled 2016-03-21: qty 1

## 2016-03-21 MED ORDER — LACTATED RINGERS IV SOLN
INTRAVENOUS | Status: DC | PRN
Start: 2016-03-21 — End: 2016-03-21

## 2016-03-21 MED ORDER — BUPIVACAINE-EPINEPHRINE (PF) 0.5% -1:200000 IJ SOLN
INTRAMUSCULAR | Status: DC | PRN
Start: 2016-03-21 — End: 2016-03-21
  Administered 2016-03-21: 25 mL via INTRAMUSCULAR

## 2016-03-21 MED ORDER — LACTATED RINGERS IV SOLN
50.0000 mL/h | INTRAVENOUS | Status: DC
Start: 2016-03-21 — End: 2016-03-21

## 2016-03-21 MED ORDER — PROPOFOL 10 MG/ML IV EMUL (WRAP)
INTRAVENOUS | Status: AC
Start: 2016-03-21 — End: ?
  Filled 2016-03-21: qty 20

## 2016-03-21 MED ORDER — MORPHINE SULFATE 10 MG/ML IJ/IV SOLN (WRAP)
Status: AC
Start: 2016-03-21 — End: ?
  Filled 2016-03-21: qty 1

## 2016-03-21 MED ORDER — OXYCODONE-ACETAMINOPHEN 5-325 MG PO TABS
1.0000 | ORAL_TABLET | ORAL | Status: AC | PRN
Start: 2016-03-21 — End: 2016-03-31

## 2016-03-21 MED ORDER — CEFAZOLIN SODIUM-DEXTROSE 2-3 GM-% IV SOLR
2.0000 g | Freq: Once | INTRAVENOUS | Status: AC
Start: 2016-03-21 — End: 2016-03-21
  Administered 2016-03-21: 2 g via INTRAVENOUS

## 2016-03-21 MED ORDER — ACETAMINOPHEN 500 MG PO TABS
500.0000 mg | ORAL_TABLET | Freq: Once | ORAL | Status: DC | PRN
Start: 2016-03-21 — End: 2016-03-21

## 2016-03-21 MED ORDER — MIDAZOLAM HCL 2 MG/2ML IJ SOLN
INTRAMUSCULAR | Status: DC | PRN
Start: 2016-03-21 — End: 2016-03-21
  Administered 2016-03-21: 2 mg via INTRAVENOUS

## 2016-03-21 MED ORDER — CELECOXIB 200 MG PO CAPS
ORAL_CAPSULE | ORAL | Status: AC
Start: 2016-03-21 — End: ?
  Filled 2016-03-21: qty 1

## 2016-03-21 MED ORDER — FENTANYL CITRATE (PF) 50 MCG/ML IJ SOLN (WRAP)
INTRAMUSCULAR | Status: AC
Start: 2016-03-21 — End: ?
  Filled 2016-03-21: qty 5

## 2016-03-21 MED ORDER — HYDROMORPHONE HCL 1 MG/ML IJ SOLN
0.5000 mg | INTRAMUSCULAR | Status: DC | PRN
Start: 2016-03-21 — End: 2016-03-21

## 2016-03-21 MED ORDER — LIDOCAINE HCL 2 % IJ SOLN
INTRAMUSCULAR | Status: DC | PRN
Start: 2016-03-21 — End: 2016-03-21
  Administered 2016-03-21: 100 mg via INTRAVENOUS

## 2016-03-21 MED ORDER — MORPHINE SULFATE (PF) 1 MG/ML IJ SOLN
INTRAMUSCULAR | Status: DC | PRN
Start: 2016-03-21 — End: 2016-03-21
  Administered 2016-03-21: 10 mg

## 2016-03-21 SURGICAL SUPPLY — 29 items
APPLCATOR CHLORAPREP 26ML (Prep) ×9 IMPLANT
BANDAGE GZE CTTN MED KRLX 3.6YDX6.4IN LF (Dressing) ×2
BANDAGE KERLIX MEDIUM GAUZE L3.6 YD X (Dressing) ×1 IMPLANT
BLADE FULL RADIUS BEIGE 3.5MM (Ortho Supply) ×3 IMPLANT
BLADE FULL RADIUS ELITE 4.5MM (Ortho Supply) IMPLANT
BLADE ORBIT SNOVTR ULTRA 4.5 (Ortho Supply) ×2 IMPLANT
BLADE SURG SAFETYLOCK STRL 11 (Blade) ×3 IMPLANT
BNDG KRLX GZE 3.6YDX6.4IN MED CTTN 6 PLY (Dressing) ×1
DRAPE EXTREMITY HEAVY DUTY (Drape) ×3 IMPLANT
DRAPE SRG PE STRDRP 17X11IN LF STRL ADH (Drape) ×3
DRAPE SRG PLS U STRDRP 51X47IN LF STRL (Drape) ×3
DRAPE SURGICAL ADHESIVE L51 IN X W47 IN (Drape) ×1
DRAPE SURGICAL ADHESIVE L51 IN X W47 IN STERI-DRAPE CLEAR (Drape) ×1 IMPLANT
DRAPE SURGICAL ADHESIVE STRIP SMALL TOWEL MATTE FINISH L17 IN X W11 IN (Drape) ×1 IMPLANT
DRESSING ABDOMINAL ONE-SZ (Dressing) ×3 IMPLANT
DRESSING PETRO 3% BI 3BRM GZE XR 8X1IN (Dressing) ×3
DRESSING PETROLATUM XEROFORM L8 IN X W1 (Dressing) ×1
DRESSING PETROLATUM XEROFORM L8 IN X W1 IN 3% BISMUTH TRIBROMOPHENATE (Dressing) ×1 IMPLANT
GLOVE SSURG SZ 7.5 (Glove) ×6 IMPLANT
GLOVE SURG BIOGEL INDIC SZ 7.5 (Glove) ×3 IMPLANT
MARKER SKIN (Positioning Supplies) ×3 IMPLANT
PACK ARTHROSCOPY IAH (Pack) ×3 IMPLANT
SET L10 FT INFLOW 10K TUBING ARTHROSCOPY (Tubing) ×1 IMPLANT
SET TUBING 10K 10FT LF STRL INFL DISP (Tubing) ×3
SPONGE GAUZE 12PLY STRL 4X4IN (Sponge) ×3 IMPLANT
SUTURE ETHILON BLACK 3-0 PS-2 L18 IN (Suture) ×1
SUTURE ETHILON BLACK 3-0 PS-2 L18 IN MONOFILAMENT NONABSORBABLE (Suture) ×1 IMPLANT
SUTURE NABSB 3-0 PS2 ETH MTPS 18IN MFL (Suture) ×3
TOURNIQUET 34IN STRL (Procedure Accessories) ×1 IMPLANT

## 2016-03-21 NOTE — Interval H&P Note (Signed)
Pt seen and examined  General: The patient is a well appearing male who appears the stated age and is in no acute distress.  Neuro: Cranial nerves 2-12 grossly intact  Neck: Supple, no lymphadenopathy  Cardiovascular: Normal rate and rhythem by pulse exam  Chest: Normal excursion of the chest wall  Abdomen: Soft, non distended  Right Knee  Large effusion  ROM 20-90  TTP over medial joint line    Impression: Right Knee Pain, medial meniscal tear  Plan: Right Knee Arthroscopy

## 2016-03-21 NOTE — Anesthesia Postprocedure Evaluation (Signed)
Anesthesia Post Evaluation    Patient: Kent Jordan    Procedures performed: Procedure(s):  ARTHROSCOPY, KNEE, RIGHT, PARTIAL MEDIAL MENISCECTOMY, PARTIAL SYNOVECTOMY    Anesthesia type: General LMA    Patient location:Phase II PACU    Last vitals:   Filed Vitals:    03/21/16 1149   BP: 174/83   Pulse: 73   Temp: 36.1C most recent   Resp: 16   SpO2: 98%       Post pain: pain score 1/10 at the time of discharge     Mental Status:awake and alert     Respiratory Function: tolerating room air    Cardiovascular: stable    Nausea/Vomiting: patient not complaining of nausea or vomiting    Hydration Status: adequate    Post assessment: no apparent anesthetic complications, no reportable events and no evidence of recall    Gerome Apley Makaria Poarch, 03/21/2016 2:35 PM

## 2016-03-21 NOTE — Discharge Instructions (Signed)

## 2016-03-21 NOTE — Anesthesia Preprocedure Evaluation (Addendum)
Anesthesia Evaluation    AIRWAY    Mallampati: III    TM distance: >3 FB  Neck ROM: full  Mouth Opening:full   CARDIOVASCULAR    cardiovascular exam normal, regular and normal       DENTAL    no notable dental hx     PULMONARY    pulmonary exam normal and clear to auscultation     OTHER FINDINGS    Patient scheduled for knee arthroscopy    Medical history and medications reviewed.    Discussed with patient GA with LMA/ETT as indicated with risk to include but not limited to N/V, H/A, sore throat.  Questions answered.          PSS Anesthesia Comments:          Anesthesia Plan    ASA 2     general               (Discussed with patient GA with LMA/ETT as indicated with risk to include but not limited to N/V, H/A, sore throat.  Questions answered. )      intravenous induction   Detailed anesthesia plan: general LMA  Monitors/Adjuncts: other (BP cuff, pulse oximeter, EKG)    Post Op: other (PACU)  Post op pain management: per surgeon    informed consent obtained    Plan discussed with CRNA.    ECG reviewed  pertinent labs reviewed  imaging results reviewed (most recent CXR 2015)

## 2016-03-21 NOTE — Op Note (Signed)
DATE OF SURGERY:    March 21, 2016     PREOPERATIVE DIAGNOSIS:  Right knee medial meniscus tear and recurrent effusion.       POSTOPERATIVE DIAGNOSIS:   Right knee effusion, medial meniscus tear, degenerative joint disease, and  synovitis.     PROCEDURE PERFORMED:  Right knee arthroscopy, partial medial meniscectomy, and partial  synovectomy.     SURGEON:  Rush Farmer, MD      ASSISTANT:  Janece Canterbury.     ANESTHESIA:  General.     ESTIMATED BLOOD LOSS:  Less than 5 mL.     IMPLANTS USED:  None.     DRAINS:  None.     COMPLICATIONS:  None.     SPECIMENS:  Right knee synovium, synovial tissue, and synovial fluid.       INDICATIONS FOR PROCEDURE:   This is a 65 year old male with a history of right knee pain for 4 months.   He has also developed recurrent effusions with medial-sided pain and  inability to ambulate.  He has failed conservative measures including  physical therapy and cortisone injections.  I have discussed the risks and  benefits of operative and nonoperative management.  The patient opted for  operative treatment.     INTRAOPERATIVE FINDINGS:    Exam under anesthesia demonstrated a moderate effusion.  Range of motion is  0 to 130 degrees.  Lachman 1A.  Posterior drawer 1+.  Stable to varus and  valgus at 0 and 30 degrees.     Diagnostic arthroscopy demonstrated grade 0 to grade 1 changes in the  patella and trochlea.  In the medial compartment, there were grade 4  degenerative changes in the medial femoral condyle.  There also appeared to  be a large defect that measured 2 cm x 1 cm x 0.5 cm.  There are grade 2  changes in the tibial plateau.  There are also radial tears along the mid  body of the medial meniscus extending into the white-white zone.     The ACL was intact.  PCL was intact.  The lateral compartment demonstrated  grade 1 changes of the lateral femoral condyle and grade 2 changes of the  tibial plateau.  No lateral meniscus tear noted.     DESCRIPTION OF PROCEDURE:  The patient was met in  the preoperative holding area.  The right knee was  marked.  He then went back to the operating room and underwent general  anesthesia.  Exam under anesthesia was then performed with aforementioned  findings.  The knee was then prepped and draped in the usual sterile  fashion.  A timeout was performed, confirming proper patient, side of  surgery, site of surgery, as well as antibiotics given.  We first began  with an aspiration.  An 18-gauge needle was placed into the superolateral  aspect of the knee and 40 mL of synovial fluid was withdrawn and sent for  culture and pathology.  We then began with a diagnostic arthroscopy with  the inferomedial and inferolateral portals.  We turned our attention to the  medial compartment.  The patient does have a large defect of the medial  femoral condyle with grade 4 changes as well.  He is noted to have a small  radial tear of the mid body of the medial meniscus.  This was trimmed back  utilizing a shaver as well as a 3.5-mm full-radius resector to smooth out  the edges.  The patient also had moderate synovitis  in the medial and  lateral compartment, and a partial synovectomy was performed in these  compartments as well as in the patellofemoral compartment.  The knee was  then thoroughly irrigated and all arthroscopic fluid was evacuated.  The  incisions were then closed with 3-0 nylon, followed by a sterile dressing.   The patient awoke from anesthesia without complications.

## 2016-03-21 NOTE — Brief Op Note (Signed)
BRIEF OP NOTE    Date Time: 03/21/2016 9:48 AM    Patient Name:   Kent Jordan    Date of Operation:   03/21/2016    Providers Performing:   Surgeon(s):  Verner Chol, MD    Assistant (s):   Circulator: Preston-Guy, Laverda Page, RN  Scrub Person: Alfredia Ferguson, RN  First Assistant: Lynnda Shields  Preceptor: Staci Acosta    Operative Procedure:   Procedure(s):  ARTHROSCOPY, KNEE, RIGHT, PARTIAL MEDIAL MENISCECTOMY, PARTIAL SYNOVECTOMY    Preoperative Diagnosis:   Pre-Op Diagnosis Codes:     * Acute medial meniscus tear of right knee, initial encounter [S83.241A]    Postoperative Diagnosis:   Post-Op Diagnosis Codes:     * Acute medial meniscus tear of right knee, initial encounter [W09.811B]     * Synovitis [M65.9]    Anesthesia:   General    Estimated Blood Loss:    * No values recorded between 03/21/2016  8:26 AM and 03/21/2016  9:44 AM *    Implants:   * No implants in log *    Drains:   Drains: no    Specimens:        SPECIMENS (last 24 hours)      Pathology Specimens       03/21/16 0900             Specimen Information    Specimen Testing Required Routine Pathology       Specimen ID  A       Specimen Description RIGHT KNEE SYNOVIUM           Findings:   See opnote    Complications:   none      Signed by: Verner Chol, MD                                                                           [facility]

## 2016-03-21 NOTE — Transfer of Care (Signed)
Anesthesia Transfer of Care Note    Patient: Kent Jordan    Procedures performed: Procedure(s):  ARTHROSCOPY, KNEE, RIGHT, PARTIAL MEDIAL MENISCECTOMY, PARTIAL SYNOVECTOMY    Anesthesia type: General LMA    Patient location:Phase I PACU    Last vitals:   Filed Vitals:    03/21/16 0950   BP: 146/70   Pulse: 83   Temp: 37.3 C (99.1 F)   Resp: 14   SpO2: 98%       Post pain: pain score 2     Mental Status:awake    Respiratory Function: tolerating nasal cannula    Cardiovascular: stable    Nausea/Vomiting: patient not complaining of nausea or vomiting    Hydration Status: adequate    Post assessment: no apparent anesthetic complications, no reportable events and no evidence of recall    Signed by: Gerome Apley Lytle [place]  03/21/2016 9:55 AM

## 2016-03-21 NOTE — H&P (View-Only) (Signed)
Kent Jordan is a 65 y.o. male who returns today after being seen recently for right knee pain.  His symptoms are no better.  I then managing him for 3 months now.  He has CT scan demonstrate a possible meniscal tear.  He has a bone scan that shows uptake along the medial femoral condyle and medial tibial plateau.  His pain is predominantly medial.  He can hardly walk.  We are unable to get an MRI secondary to a spinal cord stimulator off.    His medical history, medications and allergies were reviewed and updated in EPIC.    REVIEW OF SYSTEMS: History obtained from chart review and the patient.  General: negative for chills, fever or night sweats  Hematological and Lymphatic: negative for bleeding problems, jaundice or pallor  Endocrine: negative for hot flashes, malaise/lethargy or mood swings  Respiratory: negative for cough, shortness of breath or wheezing  Cardiovascular: negative for chest pain, LOC or SOB  Gastrointestinal: negative for abdominal pain, appetite loss or nausea/vomiting  Musculoskeletal: positive for knee pain as listed in the history of present illness.      PHYSICAL EXAM  On physical examination, Kent Jordan is a well-appearing male who appears his stated age and is in no acute distress, he is alert and oriented to person, place and time.     Left knee examination (unaffected limb) is normal with full range of motion, ligamentous stability, and no tenderness to palpation. There are no skin lesions or rashes. Distal pulses are 2+ and there is brisk capillary refill on all five toes. There is no lymphadenopathy, effusion, erythema or soft tissue swelling. Strength is 5/5 throughout the lower extremity and sensation is intact to light touch throughout. Patellar exam reveals 2 quadrant medial patellar glide, 2 quadrant lateral patellar glide, grade 2 crepitation, and negative patellar apprehension.  Range of motion is noted to be 0-130.    Right knee examination (affected limb) reveals the  following: varus alignment, there is no knee effusion and a(n) Antalgic gait is noted.  McMurray's is negative. There is no quadriceps atrophy noted on exam when compared to the contralateral side. The patient is able to perform a straight leg raise. There is tenderness to palpation over the medial joint line. There is no tenderness to palpation over the lateral joint line.  Sensation is intact to light touch sensation throughout. Manual muscle testing reveals knee extension: 5/5 and flexion: 5/5.  Patellar exam reveals 2 quadrant medial patellar glide, 2 quadrant lateral patellar glide, grade 1 crepitation, and there is no patellar apprehension. Popliteal angle is 30 degrees. Range of motion is noted to be 0-120.  Ligamentous testing reveals a grade 1A Lachman, negative anterior drawer, negative posterior drawer, grade 0 valgus instability in full extension, grade 0 valgus instability in 20 deg of flexion, grade 0 varus instability in full extension, grade 0 varus instability in 20 degrees of flexion.     My impression at this time is right medial knee pain, possible meniscal tear.    I had a long discussion with him about the diagnosis and treatment options.  I am not exactly sure what is going on his right knee and, given the fact that he cannot obtain an MRI, we do not have a definitive diagnosis.  However, he has undergone physical therapy, multiple cortisone injections as well as bisphosphonate treatment for possible spontaneous osteonecrosis with limited benefit.  Therefore, we discussed the idea that knee arthroscopy with a possible meniscectomy.  This procedure will likely be more diagnostic than therapeutic.  However, he does have a medial meniscus tear, I will certainly address this. The risks and benefits of the treatment options were discussed and the plan is to proceed with knee arthroscopy with possible meniscectomy.  We discussed the risks which include but are not limited to bleeding, infection,  damage to neurovascular structures, knee stiffness, incomplete resolution of symptoms, need for further surgery, DVT, PE, and medical complications of anesthesia. After discussing the risks and benefits of the procedure, the patient expressed willingness to proceed. We will proceed with scheduling surgery and obtain the necessary pre-operative work-up. We will see the patient back for surgery.

## 2016-03-21 NOTE — Progress Notes (Signed)
Pt's Diagnosed with OSA, OSA protocol in place. . No s/s of respiratory/cardiac distress. Pt denies SOB/Dyspnea.     O2 saturation _100__% prior to surgery.

## 2016-03-21 NOTE — Progress Notes (Signed)
Pt. Demonstrates use of crutches, pamphlet given.

## 2016-03-22 ENCOUNTER — Encounter: Payer: Self-pay | Admitting: Orthopaedic Surgery

## 2016-03-23 LAB — SYNOVIAL FLUID, CELL COUNT
Eos, Fluid: 0 %
Lymphs, Fluid: 22 %
MACROPHAGE: 72 %
Nucl Cell, Fluid: 158 cells/uL (ref 0–200)
Polys, Fluid: 6 %
Synovial Lining Cells: 0 %

## 2016-03-26 ENCOUNTER — Inpatient Hospital Stay
Payer: BLUE CROSS/BLUE SHIELD | Attending: Orthopaedic Surgery | Admitting: Rehabilitative and Restorative Service Providers"

## 2016-03-26 DIAGNOSIS — Z9889 Other specified postprocedural states: Secondary | ICD-10-CM | POA: Insufficient documentation

## 2016-03-26 DIAGNOSIS — M25561 Pain in right knee: Secondary | ICD-10-CM | POA: Insufficient documentation

## 2016-03-26 NOTE — PT/OT Therapy Note (Signed)
INITIAL EVALUATION (Knee)    Name: Kent Jordan Age: 65 y.o. Occupation: Retired SOC: 03/26/2016  Referring Physician: Verner Chol, MD MD recheck: 04/02/2016 DOS:  Date of Surgery: 03/21/16 DOI: 11/2015    Diagnosis (Treating/Medical): The primary encounter diagnosis was Acute pain of right knee. A diagnosis of S/P medial meniscectomy of right knee was also pertinent to this visit.   # of Authorized Visits: 18 Visit # 1 today     SUBJECTIVE: In March was carrying laundry up the stairs and felt a pop; was treated conservatively without enough relief therefore exploratory surgery with a medial menisectomy on the right. Since surgery has done very well; it swelled but he propped it up which has helped; uses cane for long walks but around the house hasn't been using it; PMH of right nerve issues due to previous back problems    Mechanism of Injury/History of Present Illness:      Pain Characteristics:  Location: right knee  Quality: stiff, sore  Aggravating Factors: 100% of weight on it, hasn't full tested it  Alleviating Factors: lay off of it; exercises    Patient's reason for seeking PT /Functional Limitations (PLOF):  See POC    Past Medical History:   Past Medical History   Diagnosis Date   . Back pain    . Hypertensive disorder    . High cholesterol    . Failed back syndrome    . Pneumonia 11/2009   . Bilateral cataracts      removed 2009   . Neuropathy      Tingling left foot, numbness left toes   . Low back pain    . Difficulty in walking(719.7)      Uses cane to ambulate   . Elevated liver function tests    . 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 Psoriasis    . Arthritis      psoriatic and osteoarthritis    . Blood disorder 11/2014     Hemochromatosis   . Psoriatic arthritis    . Sleep apnea      Does not use CPAP   . Arrhythmia      when on ARB     Medications: No outpatient prescriptions have been marked as taking for the 03/26/16 encounter (Clinical Support) with Mauro Kaufmann, Molli Hazard, PT.        Other  Treatment/Prior Therapy: No prior PT  Prior Hospitalization: No  Hand Dominance: Dominant Hand: Right Involved Side: Involved Side: Right   DiagnosticTests: per MR    Outcome Measure:   LEFS 36/80% Pain Score: 23% Rate Satisfaction with Current Function: 3/10   Living Environment: Type of Residence: Multi-story home      Dwelling Entrance: # Steps to Enter: 14   Patient lives with: Living Arrangements: Spouse/significant other, Children, Pets    OBJECTIVE:    Vitals: 164/94 (patient currently being followed by MD)          Observation/Posture/Gait/Integumentary:  Observation of posture: R>L genu varum, bilateral recurvatum however maintains flexed; P/A    Ambulation: antalgic on right with poor weight shift over right LE    Integumentary: Surgical incisions healing without signs of infection    Neurological Screen:      Sensation to Light touch: right lateral knee hypersensitivity     Girth/Edema:    Initial R  Initial L   Suprapatellar 43     Mid Patellar 39.5     Infrapatellar  37     10 cm. Prox.      10 cm. Dist. 37.5           (blank fields were intentionally left blank)    Range of Motion: (degrees)      Right  AROM   Right PROM Knee   Left AROM   Left PROM   120  Flexion 135    0(rests in 5)  Extension 0    (blank fields were intentionally left blank)    Hip AROM: Limitations hip IR, extension  Ankle AROM: NT today    Flexibility: decreased posterior chain (hamstring/gluteals)    Lower Extremity Manual Muscle Testing/Myotomes    MMT = /5 IE R IE L   Knee extension (L3) 3+ 5   Knee Flexion (S1) 3+ 4+   (blank fields were intentionally left blank)     Functional Strength:   Squat:  Partial- sitting    Joint Mobility:  Tibiofemoral Mobility: NT today  Patellar Mobility: hypomobile Direction: superior and inferior    Treatment Initial Visit:  Evaluation   Patient Education on current status, plan of care, home exercise program, role of the physical therapist, precautions including driving and wound care-  maintaining dry incisions  Therapeutic exercise quad setting from slight flex(towel roll); quad set with SLR  Modalities: None  Rehab Potential:excellent  Is patient aware of diagnosis: Yes  For Next Visit Add STM, gentle joint mobility, core facilitation and Pelvic PNF    Charlies Silvers Sickle PT, DPT, CFMT [phone]   03/26/2016    Total Treatment (billable) Time:  45  Total Times Minutes:  15    Plan of Care IPTC Medicare Provider #: [phone]                Patient Name: Kent Jordan  MRN: [id number]  DOI:   DOS: Date of Surgery: 03/21/16 SOC:03/26/2016    Diagnosis:     ICD-10-CM    1. Acute pain of right knee M25.561    2. S/P medial meniscectomy of right knee Z98.890        ASSESSMENT: the patient is a 65 y.o. male presenting s/p right medial menisectomy who requires Physical Therapy for the following:  Impairments: limitations in right knee ROM, strength, pain, poor posture and gait mechanics    Barriers to Rehabilitations/Comorbidities/personal  factors:  Past surgical history Microdiscectomy, lumbar funsion, Electrostimulator in back    Pain located: right knee    Clinical presentation: stable typical restrictions s/p meniscectomy    Functional Limitations (PLOF): pain limiting walking after injury to right knee    Plan Of Care: Body Mechanics Education, NMR, Proprioceptive Activites, Instruction in HEP, Therapeutic Exercise, Balance/Gait training and Soft Tissue/Joint Mobilization     Frequency/Duration: 2 times a week for 18 sessions. Certification Status Ends: 06/22/2016    Goals:  Date (Body Area, Impairment Goal, Functional   Activity, Target Performance) Time Frame Status Date/  Initial   03/26/2016   Increase knee flexion AROM to 135 degrees to allow patient to safely negotiate stairs reciprocally with railing.  12 sessions Initial Eval    03/26/2016   Increase quadriceps strength <4/5 for safe ascending stairs/descending stairs reciprocally.  18 sessions Initial Eval    03/26/2016   Improve LEFS to >75%  18  sessions Initial Eval    03/26/2016   Patient will demonstrate independence in prescribed HEP with proper form, sets and reps for safe discharge to an independent program.  18 sessions Initial  Eval      Signature: Kathrin Greathouse PT, DPT, CFMT [phone] Date: 03/26/2016    Signature: Verner Chol, MD _______________________  Date:  ________________     Patient Name: Kent Jordan  MRN: [id number]

## 2016-03-26 NOTE — PT/OT Plan of Care (Signed)
Plan of Care IPTC Medicare Provider #: [id number]                Patient Name: Kent Jordan  MRN: [id number]  DOI:   DOS: Date of Surgery: 03/21/16 SOC:03/26/2016    Diagnosis:     ICD-10-CM    1. Acute pain of right knee M25.561    2. S/P medial meniscectomy of right knee Z98.890        ASSESSMENT: the patient is a 65 y.o. male presenting s/p right medial menisectomy who requires Physical Therapy for the following:  Impairments: limitations in right knee ROM, strength, pain, poor posture and gait mechanics    Barriers to Rehabilitations/Comorbidities/personal  factors:  Past surgical history Microdiscectomy, lumbar funsion, Electrostimulator in back    Pain located: right knee    Clinical presentation: stable typical restrictions s/p meniscectomy    Functional Limitations (PLOF): pain limiting walking after injury to right knee    Plan Of Care: Body Mechanics Education, NMR, Proprioceptive Activites, Instruction in HEP, Therapeutic Exercise, Balance/Gait training and Soft Tissue/Joint Mobilization     Frequency/Duration: 2 times a week for 18 sessions. Certification Status Ends: 06/22/2016    Goals:  Date (Body Area, Impairment Goal, Functional   Activity, Target Performance) Time Frame Status Date/  Initial   03/26/2016   Increase knee flexion AROM to 135 degrees to allow patient to safely negotiate stairs reciprocally with railing.  12 sessions Initial Eval    03/26/2016   Increase quadriceps strength <4/5 for safe ascending stairs/descending stairs reciprocally.  18 sessions Initial Eval    03/26/2016   Improve LEFS to >75%  18 sessions Initial Eval    03/26/2016   Patient will demonstrate independence in prescribed HEP with proper form, sets and reps for safe discharge to an independent program.  18 sessions Initial Eval      Signature: Kathrin Greathouse PT, DPT, CFMT [phone] Date: 03/26/2016    Signature: Verner Chol, MD _______________________  Date:  ________________     Patient Name: Kent Jordan  MRN:  [id number]

## 2016-03-29 ENCOUNTER — Inpatient Hospital Stay
Payer: BLUE CROSS/BLUE SHIELD | Attending: Orthopaedic Surgery | Admitting: Rehabilitative and Restorative Service Providers"

## 2016-03-29 DIAGNOSIS — Z9889 Other specified postprocedural states: Secondary | ICD-10-CM | POA: Insufficient documentation

## 2016-03-29 DIAGNOSIS — M25561 Pain in right knee: Secondary | ICD-10-CM

## 2016-03-29 NOTE — PT/OT Therapy Note (Signed)
DAILY NOTE   03/29/2016        Total Treatment (billable)Time: 40   Total Timed Minutes:  40 Visit Number:  2      Payor: [employer] / Plan: [employer] / Product Type: *No Product type* /    # of Authorized Visits: 18 Visit #: 1      Diagnosis (Treating/Medical):     ICD-10-CM    1. Acute pain of right knee M25.561    2. S/P medial meniscectomy of right knee Z98.890            Subjective:  Kent Jordan notes that his knee has given out on him a couple times since last time; also notes that he has tried to do the exercise(quad set with SLR) however its been giving him some pain      Objective:   Treatment:  Therapeutic Exercise: to improve: Flexibility/ROM, Stabilization and Strength   Self STM with the stick, tennis ball  Standing gastroc/soleus stretch    Modifications/Patient Education:  Verbal, Visual and Tactile cues     NMR:  Standing TKE  Review of Supine quad set into SLR for mechanics and decreased reps per cycle    Therapeutic Activities:  n/a    Manual Therapy:   STM/MFR- SF surrounding knee joint; scar tissue mobs not performed today due to stitches still in place; BC tib tub, patella borders; STM to patellar tendon, infrapatellar fat pad and bursae, saphenous nerve, VMO/vastus lateralis, rectus femoris,  MCL/LCL performed in full extension and flexion to tolerance      Initial Evaluation Reference and/orCurrent Measurements (ROM, Strength, Girth, Outcomes, etc.):   ROM   -6-115     Assessment (response to treatment):   Patient demonstrated full knee extension and knee flexion to 124. Patient tolerated treatment very well with mild soreness greater with quad setting vs treatment; address scar tissue when appropriate.    Progress towards functional goals: Goals:  Date  (Body Area, Impairment Goal, Functional     Activity, Target Performance)  Time Frame  Status  Date/   Initial    03/26/2016   Increase knee flexion AROM to 135 degrees to allow patient to safely negotiate stairs reciprocally with railing.   12 sessions   Initial Eval      03/26/2016    Increase quadriceps strength <4/5 for safe ascending stairs/descending stairs reciprocally.   18 sessions  Initial Eval      03/26/2016    Improve LEFS to >75%   18 sessions  Initial Eval      03/26/2016    Patient will demonstrate independence in prescribed HEP with proper form, sets and reps for safe discharge to an independent program.   18 sessions  Initial Eval        Patient requires continued skilled care to: continue to progress LE mobility, ROM, strength and functional gait     Plan:  Continue with Plan of Care    Kent Jordan PT, DPT, CFMT [phone]  03/29/2016

## 2016-04-02 ENCOUNTER — Inpatient Hospital Stay
Payer: BLUE CROSS/BLUE SHIELD | Attending: Orthopaedic Surgery | Admitting: Rehabilitative and Restorative Service Providers"

## 2016-04-02 DIAGNOSIS — M25561 Pain in right knee: Secondary | ICD-10-CM | POA: Insufficient documentation

## 2016-04-02 DIAGNOSIS — Z9889 Other specified postprocedural states: Secondary | ICD-10-CM | POA: Insufficient documentation

## 2016-04-02 NOTE — PT/OT Therapy Note (Signed)
DAILY NOTE   04/02/2016        Total Treatment (billable)Time:   Total Timed Minutes:  41 Visit Number:  3      Payor: [employer] / Plan: [employer] / Product Type: *No Product type* /    # of Authorized Visits: 18 Visit #: 3      Diagnosis (Treating/Medical):     ICD-10-CM    1. Acute pain of right knee M25.561    2. S/P medial meniscectomy of right knee Z98.890            Subjective:  Kaheem's notes that his knee has been pretty sore, more then before, notes that he has had more soreness/swelling and that in the AM it wants to give out on him      Objective:   Treatment:  Therapeutic Exercise: to improve: Flexibility/ROM, Stabilization and Strength   Recumbent bike for AROM and swelling reduction- performed with subjective taken today  Reviewed:  Self STM with  the stick, tennis ball  Standing gastroc/soleus stretch    Modifications/Patient Education:  Verbal, Visual and Tactile cues     NMR:    Pulled forward  Standing TKE  Review of Supine quad set into SLR for mechanics and decreased reps per cycle    Therapeutic Activities:  n/a    Manual Therapy:   STM/MFR- SF surrounding knee joint; scar tissue mobs not performed today due to stitches still in place; BC tib tub, patella borders; STM to patellar tendon, infrapatellar fat pad and bursae, saphenous nerve, VMO/vastus lateralis, rectus femoris,  MCL/LCL performed in full extension and flexion to tolerance      Initial Evaluation Reference and/orCurrent Measurements (ROM, Strength, Girth, Outcomes, etc.):   ROM   -6-115     Assessment (response to treatment):   Patient demonstrated full knee extension and improved knee flexion again; emphasized with patient to emphasize TKE at home for efficient gait.    Progress towards functional goals: Goals:  Date  (Body Area, Impairment Goal, Functional     Activity, Target Performance)  Time Frame  Status  Date/   Initial    03/26/2016   Increase knee flexion AROM to 135 degrees to allow patient to safely negotiate stairs  reciprocally with railing.   12 sessions  Initial Eval      03/26/2016    Increase quadriceps strength <4/5 for safe ascending stairs/descending stairs reciprocally.   18 sessions  Initial Eval      03/26/2016    Improve LEFS to >75%   18 sessions  Initial Eval      03/26/2016    Patient will demonstrate independence in prescribed HEP with proper form, sets and reps for safe discharge to an independent program.   18 sessions  Initial Eval        Patient requires continued skilled care to: continue to progress LE mobility, ROM, strength and functional gait     Plan:  Continue with Plan of Care    Charlies Silvers Sickle PT, DPT, CFMT [phone]  04/02/2016

## 2016-04-03 LAB — LAB USE ONLY - HISTORICAL SURGICAL PATHOLOGY

## 2016-04-05 ENCOUNTER — Encounter (INDEPENDENT_AMBULATORY_CARE_PROVIDER_SITE_OTHER): Payer: Self-pay | Admitting: Orthopaedic Surgery

## 2016-04-05 ENCOUNTER — Ambulatory Visit (INDEPENDENT_AMBULATORY_CARE_PROVIDER_SITE_OTHER): Payer: BLUE CROSS/BLUE SHIELD | Admitting: Orthopaedic Surgery

## 2016-04-05 VITALS — BP 147/83 | HR 76 | Temp 97.6°F | Ht 71.0 in | Wt 215.0 lb

## 2016-04-05 DIAGNOSIS — M1711 Unilateral primary osteoarthritis, right knee: Secondary | ICD-10-CM | POA: Insufficient documentation

## 2016-04-05 NOTE — Progress Notes (Signed)
Patient returns for follow-up status post right knee arthroscopy.  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  Overall he is feeling better than before his initial visit.  However, he still has moderate disability with ambulation.  He understands that he does have end-stage degenerative changes and will likely require a total knee arthroplasty.  He would like to have a referral for a total joint specialist.    Physical exam  Right knee shows incision to be well-healed.  The sutures were removed and station were applied.  Range of motion is 0-110 degrees.  He still has medial sided tenderness.  There is a mild effusion noted.  He is otherwise neurovascular intact.    Impression: Status post right knee arthroscopy with end-stage medial apartment.  Degenerative joint disease  Plan: I reviewed my findings with the patient.  We reviewed his arthroscopic images.  I do think he would likely benefit from a total knee arthroplasty.  I gave him the name of Dr. Harriet Masson.  I will see him back on an as-needed basis.  All courses were answered

## 2016-04-06 ENCOUNTER — Inpatient Hospital Stay: Payer: BLUE CROSS/BLUE SHIELD | Admitting: Rehabilitative and Restorative Service Providers"

## 2016-04-09 ENCOUNTER — Inpatient Hospital Stay
Payer: BLUE CROSS/BLUE SHIELD | Attending: Orthopaedic Surgery | Admitting: Rehabilitative and Restorative Service Providers"

## 2016-04-09 DIAGNOSIS — Z9889 Other specified postprocedural states: Secondary | ICD-10-CM | POA: Insufficient documentation

## 2016-04-09 DIAGNOSIS — M25561 Pain in right knee: Secondary | ICD-10-CM | POA: Insufficient documentation

## 2016-04-09 NOTE — PT/OT Therapy Note (Signed)
DAILY NOTE   04/09/2016        Total Treatment (billable)Time:   Total Timed Minutes:  41 Visit Number:  4      Payor: [employer] / Plan: [employer] [employer] / Product Type: *No Product type* /    # of Authorized Visits: 18 Visit #: 3      Diagnosis (Treating/Medical):     ICD-10-CM    1. Acute pain of right knee M25.561    2. S/P medial meniscectomy of right knee Z98.890            Subjective:  Kent Jordan's notes that he went to a concert over the weekend with a lot of walking and stairs and the knee is pretty stiff/sore today      Objective:   Treatment:  Therapeutic Exercise: to improve: Flexibility/ROM, Stabilization and Strength   Recumbent bike for AROM and swelling reduction- performed with subjective taken today  Reviewed:  Self STM with  the stick, tennis ball  Standing gastroc/soleus stretch    Modifications/Patient Education:  Verbal, Visual and Tactile cues     NMR:      Pulled forward- not performed today due to increased swelling and pain  Standing TKE  Review of Supine quad set into SLR for mechanics and decreased reps per cycle    Therapeutic Activities:  n/a    Manual Therapy:   STM/MFR- SF surrounding knee joint; scar tissue mobs, BC tib tub, patella borders; STM to patellar tendon, infrapatellar fat pad and bursae, saphenous nerve, VMO/vastus lateralis, rectus femoris,  MCL/LCL performed in full extension and flexion to tolerance  Manual stretching into knee flexion and extension ranges- on/off barrier to pt tolerance      Initial Evaluation Reference and/orCurrent Measurements (ROM, Strength, Girth, Outcomes, etc.):   ROM   -6-115     Assessment (response to treatment):   Patient demonstrated increased pain in the knee today, able to perform scar tissue mobs today with significant restrictions noted; reassess mobility and tolerance to progressing exercises for mobility and strength at next session    Progress towards functional goals: Goals:  Date  (Body Area, Impairment Goal, Functional     Activity, Target  Performance)  Time Frame  Status  Date/   Initial    03/26/2016   Increase knee flexion AROM to 135 degrees to allow patient to safely negotiate stairs reciprocally with railing.   12 sessions  Initial Eval      03/26/2016    Increase quadriceps strength <4/5 for safe ascending stairs/descending stairs reciprocally.   18 sessions  Initial Eval      03/26/2016    Improve LEFS to >75%   18 sessions  Initial Eval      03/26/2016    Patient will demonstrate independence in prescribed HEP with proper form, sets and reps for safe discharge to an independent program.   18 sessions  Initial Eval        Patient requires continued skilled care to: continue to progress LE mobility, ROM, strength and functional gait     Plan:  Continue with Plan of Care    Kent Jordan PT, DPT, CFMT [phone]  04/09/2016

## 2016-04-12 ENCOUNTER — Inpatient Hospital Stay
Payer: BLUE CROSS/BLUE SHIELD | Attending: Orthopaedic Surgery | Admitting: Rehabilitative and Restorative Service Providers"

## 2016-04-12 ENCOUNTER — Encounter (INDEPENDENT_AMBULATORY_CARE_PROVIDER_SITE_OTHER): Payer: Self-pay

## 2016-04-12 DIAGNOSIS — Z9889 Other specified postprocedural states: Secondary | ICD-10-CM | POA: Insufficient documentation

## 2016-04-12 DIAGNOSIS — M25561 Pain in right knee: Secondary | ICD-10-CM | POA: Insufficient documentation

## 2016-04-12 NOTE — PT/OT Therapy Note (Signed)
DAILY NOTE   04/12/2016        Total Treatment (billable)Time:   Total Timed Minutes:  40 Visit Number:  5      Payor: [employer] / Plan: [employer] [employer] / Product Type: *No Product type* /    # of Authorized Visits: 18 Visit #: 5      Diagnosis (Treating/Medical):     ICD-10-CM    1. Acute pain of right knee M25.561    2. S/P medial meniscectomy of right knee Z98.890            Subjective:  Marquelle reports "knee is swollen. Pain here (patellar tendon)" Pt reporting f/u with knee surgeon Aug 7 and reports possibility of TKR. Pt also with trip coming up in Feb      Objective:   Treatment:  Therapeutic Exercise: to improve: Flexibility/ROM, Stabilization and Strength   Recumbent bike for AROM with small random hills x 6 min and swelling reduction- performed with subj    Reviewed:  Self STM with  the stick, tennis ball  Standing gastroc/soleus stretch - discussed keeping heel on ground and knee straight     Modifications/Patient Education:  Verbal, Visual and Tactile cues     NMR:    AAROM LE flex/abd/ER to ext/add/IR  Resisted femur at distal thigh with heel slide/ ext/add/IR pattern  R LE PD with ext/abd/IR, approx through distal femur to facilitate quads/glutes     Therapeutic Activities:  n/a    Manual Therapy:   STM/MFR- SF surrounding knee joint; scar tissue mobs, BC tib tub, patella borders; STM to patellar tendon and gentle mobs with heel slide, infrapatellar fat pad and bursae, saphenous nerve, VMO/vastus lateralis, rectus femoris,  MCL/LCL performed in full extension and flexion to tolerance  Manual stretching into knee flexion and extension ranges- on/off barrier to pt tolerance  Effleurage for swelling   STM prox/medial gastroc with active DF/PF    Initial Evaluation Reference and/orCurrent Measurements (ROM, Strength, Girth, Outcomes, etc.):   ROM: -4  - 125     Assessment (response to treatment):   Pt with dec pain and improved ROM by end of session. Focused on swelling/pain reduction, gastroc mobility and quad  facilitation to improve WBing. Pt would benefit from cont low level quad facilitation to improve gait and dec pain as well as edu reg standing posturing in reg to LEs.     Progress towards functional goals:   Date  (Body Area, Impairment Goal, Functional     Activity, Target Performance)  Time Frame  Status  Date/   Initial    03/26/2016   Increase knee flexion AROM to 135 degrees to allow patient to safely negotiate stairs reciprocally with railing.   12 sessions  Progressing 125 04/12/16 EBW   03/26/2016    Increase quadriceps strength <4/5 for safe ascending stairs/descending stairs reciprocally.   18 sessions      03/26/2016    Improve LEFS to >75%   18 sessions      03/26/2016    Patient will demonstrate independence in prescribed HEP with proper form, sets and reps for safe discharge to an independent program.   18 sessions  indep thusfar  04/12/16 EBW     Patient requires continued skilled care to: continue to progress LE mobility, ROM, strength and functional gait     Plan:  Continue with Plan of Care    Simren Popson B. Sheliah Hatch, PT, DPT  [place] [address]    04/12/2016

## 2016-04-17 ENCOUNTER — Inpatient Hospital Stay
Payer: BLUE CROSS/BLUE SHIELD | Attending: Orthopaedic Surgery | Admitting: Rehabilitative and Restorative Service Providers"

## 2016-04-17 DIAGNOSIS — Z9889 Other specified postprocedural states: Secondary | ICD-10-CM | POA: Insufficient documentation

## 2016-04-17 DIAGNOSIS — M25561 Pain in right knee: Secondary | ICD-10-CM | POA: Insufficient documentation

## 2016-04-17 NOTE — PT/OT Therapy Note (Signed)
DAILY NOTE   04/17/2016        Total Treatment (billable)Time:   Total Timed Minutes:  40 Visit Number:  6      Payor: [employer] / Plan: [employer] [employer] / Product Type: *No Product type* /    # of Authorized Visits: 18 Visit #: 6      Diagnosis (Treating/Medical):     ICD-10-CM    1. Acute pain of right knee M25.561    2. S/P medial meniscectomy of right knee Z98.890            Subjective:  Kent Jordan reports he was very sore after last session, wants to take it easy today    Objective:   Treatment:  Therapeutic Exercise: to improve: Flexibility/ROM, Stabilization and Strength   Recumbent bike for AROM with level 1 x 6 min and swelling reduction- performed with subj    Reviewed:  Self STM with  the stick, tennis ball  Standing gastroc/soleus stretch - discussed keeping heel on ground and knee straight     Modifications/Patient Education:  Verbal, Visual and Tactile cues     NMR:       Therapeutic Activities:  n/a    Manual Therapy:   STM/MFR- SF surrounding knee joint; scar tissue mobs, BC tib tub, patella borders; STM to patellar tendon and gentle mobs with heel slide, infrapatellar fat pad and bursae, saphenous nerve, VMO/vastus lateralis, rectus femoris,  MCL/LCL performed in full extension and flexion to tolerance  Manual stretching into knee flexion and extension ranges- on/off barrier to pt tolerance  Effleurage for swelling   STM prox/medial gastroc with active DF/PF    Initial Evaluation Reference and/orCurrent Measurements (ROM, Strength, Girth, Outcomes, etc.):   ROM: -4  - 125     Assessment (response to treatment):   Pt with dec pain after treatment today, noting significant scar tissue restriction proximal/distal and surrounding scar tissue. Held NMR today due to sense of significant discomfort from previous sessions NMR     Progress towards functional goals:   Date  (Body Area, Impairment Goal, Functional     Activity, Target Performance)  Time Frame  Status  Date/   Initial    03/26/2016   Increase knee  flexion AROM to 135 degrees to allow patient to safely negotiate stairs reciprocally with railing.   12 sessions  Progressing 125 04/12/16 EBW   03/26/2016    Increase quadriceps strength <4/5 for safe ascending stairs/descending stairs reciprocally.   18 sessions      03/26/2016    Improve LEFS to >75%   18 sessions      03/26/2016    Patient will demonstrate independence in prescribed HEP with proper form, sets and reps for safe discharge to an independent program.   18 sessions  indep thusfar  04/12/16 EBW     Patient requires continued skilled care to: continue to progress LE mobility, ROM, strength and functional gait     Plan:  Continue with Plan of Care    Charlies Silvers Sickle PT, DPT, CFMT [phone]      04/17/2016

## 2016-04-20 ENCOUNTER — Inpatient Hospital Stay
Payer: BLUE CROSS/BLUE SHIELD | Attending: Orthopaedic Surgery | Admitting: Rehabilitative and Restorative Service Providers"

## 2016-04-20 DIAGNOSIS — M25561 Pain in right knee: Secondary | ICD-10-CM | POA: Insufficient documentation

## 2016-04-20 DIAGNOSIS — Z9889 Other specified postprocedural states: Secondary | ICD-10-CM | POA: Insufficient documentation

## 2016-04-20 NOTE — PT/OT Therapy Note (Signed)
DAILY NOTE   04/20/2016        Total Treatment (billable)Time:   Total Timed Minutes:  40 Visit Number:  7      Payor: [employer] / Plan: [place] [employer] / Product Type: *No Product type* /    # of Authorized Visits: 18 Visit #: 6      Diagnosis (Treating/Medical):     ICD-10-CM    1. Acute pain of right knee M25.561    2. S/P medial meniscectomy of right knee Z98.890            Subjective:  Aleksandr reports "I'm doing okay. THe knee has calmed down a little bit. Yesterday was a bad day. My washer blew up." Pt did a lot of standing yesterday. Reports it's feeling better today vs yesterday.     Objective:   Treatment:  Therapeutic Exercise: to improve: Flexibility/ROM, Stabilization and Strength   Recumbent bike for AROM with level 1 x 6 min and swelling reduction- performed with subj  Edu discussed patellar tendon strap to dec tension on patellar tendon     Reviewed:  Self STM with  the stick, tennis ball  Standing gastroc/soleus stretch - discussed keeping heel on ground and knee straight     Modifications/Patient Education:  Verbal, Visual and Tactile cues     NMR:     Resisted DF with SAQ  SAQ in 3 parts with foam roller holds/eccentrics     Therapeutic Activities:  n/a    Manual Therapy:   STM patellar contours, patellar tendon, fat pad, MCL/LCL, VMO, ITB  Patellar mobs gr i-iii   Distraction with plunger FMs and quad sets    Initial Evaluation Reference and/orCurrent Measurements (ROM, Strength, Girth, Outcomes, etc.):   ROM: -4  - 125     Assessment (response to treatment):   Pt  With cont pain around patellar tendon. Overall dec in symptoms since recent flare up, but cont to be swollen. Recommended patellar tendon strap at this time to help dec pain/symptoms and strain to tendon to improve healing.     Progress towards functional goals:   Date  (Body Area, Impairment Goal, Functional     Activity, Target Performance)  Time Frame  Status  Date/   Initial    03/26/2016   Increase knee flexion AROM to 135 degrees to  allow patient to safely negotiate stairs reciprocally with railing.   12 sessions  Progressing 125 04/12/16 EBW   03/26/2016    Increase quadriceps strength <4/5 for safe ascending stairs/descending stairs reciprocally.   18 sessions      03/26/2016    Improve LEFS to >75%   18 sessions      03/26/2016    Patient will demonstrate independence in prescribed HEP with proper form, sets and reps for safe discharge to an independent program.   18 sessions  indep thusfar  04/12/16 EBW     Patient requires continued skilled care to: continue to progress LE mobility, ROM, strength and functional gait     Plan:  Continue with Plan of Care    Clarise Chacko B. Sheliah Hatch, PT, DPT  [place] [address]    04/20/2016

## 2016-04-23 ENCOUNTER — Inpatient Hospital Stay
Payer: BLUE CROSS/BLUE SHIELD | Attending: Orthopaedic Surgery | Admitting: Rehabilitative and Restorative Service Providers"

## 2016-04-23 DIAGNOSIS — M25561 Pain in right knee: Secondary | ICD-10-CM | POA: Insufficient documentation

## 2016-04-23 DIAGNOSIS — Z9889 Other specified postprocedural states: Secondary | ICD-10-CM | POA: Insufficient documentation

## 2016-04-23 NOTE — PT/OT Therapy Note (Signed)
DAILY NOTE   04/23/2016        Total Treatment (billable)Time:   Total Timed Minutes:  40 Visit Number:  8      Payor: [employer] / Plan: [employer] [employer] / Product Type: *No Product type* /    # of Authorized Visits: 18 Visit #: 7      Diagnosis (Treating/Medical):     ICD-10-CM    1. Acute pain of right knee M25.561    2. S/P medial meniscectomy of right knee Z98.890            Subjective:  Najir reports "i overdid it yesterday - went shopping with my wife (>67mi and stairs). I got a knee strap and wanted you to show me where to put it. It's feeling better than yesterday.     Objective:   Treatment:  Therapeutic Exercise: to improve: Flexibility/ROM, Stabilization and Strength   W/U with AROM with level 4 RHs x 6 min and swelling reduction- performed with subj  Edu set up of patellar strap to dec stress to tendon . Discussed practicing walking in pool,     Reviewed:  Self STM with  the stick, tennis ball  Standing gastroc/soleus stretch - discussed keeping heel on ground and knee straight     Modifications/Patient Education:  Verbal, Visual and Tactile cues     NMR:     Quad sets with DF  Standing TKE COI with green band distal thigh     Therapeutic Activities:  n/a    Manual Therapy:   Cont with STM patellar contours, patellar tendon, fat pad, MCL/LCL, VMO, ITB  Patellar mobs gr i-iii   Distraction and mobs with plunger FMs and quad sets    Initial Evaluation Reference and/orCurrent Measurements (ROM, Strength, Girth, Outcomes, etc.):   ROM: - 1 - 128     Assessment (response to treatment):   Pt with significant dec pain walking after session, and dec in antalgic gait. Edu in use of knee tendon bar to help dec irritation with walking. Significant improvement in ROM since [facility].     Progress towards functional goals:   Date  (Body Area, Impairment Goal, Functional     Activity, Target Performance)  Time Frame  Status  Date/   Initial    03/26/2016   Increase knee flexion AROM to 135 degrees to allow patient to safely  negotiate stairs reciprocally with railing.   12 sessions  Progressing 128 04/23/16 EBW   03/26/2016    Increase quadriceps strength <4/5 for safe ascending stairs/descending stairs reciprocally.   18 sessions      03/26/2016    Improve LEFS to >75%   18 sessions      03/26/2016    Patient will demonstrate independence in prescribed HEP with proper form, sets and reps for safe discharge to an independent program.   18 sessions  indep thusfar  04/12/16 EBW     Patient requires continued skilled care to: continue to progress LE mobility, ROM, strength and functional gait     Plan:  Continue with Plan of Care    Meiko Stranahan B. Sheliah Hatch, PT, DPT  [place] [address]    04/23/2016

## 2016-04-26 ENCOUNTER — Inpatient Hospital Stay
Payer: BLUE CROSS/BLUE SHIELD | Attending: Orthopaedic Surgery | Admitting: Rehabilitative and Restorative Service Providers"

## 2016-04-26 DIAGNOSIS — M25561 Pain in right knee: Secondary | ICD-10-CM | POA: Insufficient documentation

## 2016-04-26 DIAGNOSIS — Z9889 Other specified postprocedural states: Secondary | ICD-10-CM

## 2016-04-26 NOTE — PT/OT Therapy Note (Signed)
DAILY NOTE   04/26/2016        Total Treatment (billable)Time:   Total Timed Minutes:  40 Visit Number:  9      Payor: [employer] / Plan: [employer] [employer] / Product Type: *No Product type* /    # of Authorized Visits: 18 Visit #: 8      Diagnosis (Treating/Medical):     ICD-10-CM    1. Acute pain of right knee M25.561    2. S/P medial meniscectomy of right knee Z98.890            Subjective:  Champ reports he took [place] advice and got into the pool did a lot of exercises and now is feeling sore in the whole body. Despite the soreness he states that it made him feel pretty good to be doing the exercise and he plans to go again today    Objective:   Treatment:  Therapeutic Exercise: to improve: Flexibility/ROM, Stabilization and Strength   W/U with AROM with level 1 RHs x 6 min and swelling reduction- performed with subj  Review of DOMs and exercise soreness and discussion of exercise theory and progression with walking in pool,     Reviewed:  Self STM with  the stick, tennis ball  Standing gastroc/soleus stretch - discussed keeping heel on ground and knee straight     Modifications/Patient Education:  Verbal, Visual and Tactile cues     NMR:   Step up training with weight shift and emphasis on posterior LE push off- performed.practice on 4 in 6 in step for NMR and strengthening      Therapeutic Activities:  Functional squats for PH and repitition    Manual Therapy:   Cont with STM patellar contours, fib head, tib tub, gastroc/soleus, peroneals, distal hamstring tendons- performed with DF/PF    Initial Evaluation Reference and/orCurrent Measurements (ROM, Strength, Girth, Outcomes, etc.):   ROM: - 1 - 128     Assessment (response to treatment):   Pt notes he felt pretty good after session today, assumes he will be pretty sore tomorrow. Overall improving functional mechanics with walking and stairs, improving ROM, will continue to benefit from continued functional strenghtening    Progress towards functional goals:   Date   (Body Area, Impairment Goal, Functional     Activity, Target Performance)  Time Frame  Status  Date/   Initial    03/26/2016   Increase knee flexion AROM to 135 degrees to allow patient to safely negotiate stairs reciprocally with railing.   12 sessions  Progressing 128 04/23/16 EBW   03/26/2016    Increase quadriceps strength <4/5 for safe ascending stairs/descending stairs reciprocally.   18 sessions      03/26/2016    Improve LEFS to >75%   18 sessions      03/26/2016    Patient will demonstrate independence in prescribed HEP with proper form, sets and reps for safe discharge to an independent program.   18 sessions  indep thusfar  04/12/16 EBW     Patient requires continued skilled care to: continue to progress LE mobility, ROM, strength and functional gait     Plan:  Continue with Plan of Care    Charlies Silvers Sickle PT, DPT, CFMT [phone]      04/26/2016

## 2016-04-30 ENCOUNTER — Encounter (INDEPENDENT_AMBULATORY_CARE_PROVIDER_SITE_OTHER): Payer: Self-pay | Admitting: Orthopaedic Surgery

## 2016-04-30 ENCOUNTER — Ambulatory Visit (INDEPENDENT_AMBULATORY_CARE_PROVIDER_SITE_OTHER): Payer: BLUE CROSS/BLUE SHIELD

## 2016-04-30 ENCOUNTER — Inpatient Hospital Stay
Payer: BLUE CROSS/BLUE SHIELD | Attending: Orthopaedic Surgery | Admitting: Rehabilitative and Restorative Service Providers"

## 2016-04-30 ENCOUNTER — Ambulatory Visit (INDEPENDENT_AMBULATORY_CARE_PROVIDER_SITE_OTHER): Payer: BLUE CROSS/BLUE SHIELD | Admitting: Orthopaedic Surgery

## 2016-04-30 VITALS — BP 135/83 | HR 80 | Wt 217.0 lb

## 2016-04-30 DIAGNOSIS — Z9889 Other specified postprocedural states: Secondary | ICD-10-CM | POA: Insufficient documentation

## 2016-04-30 DIAGNOSIS — M25561 Pain in right knee: Secondary | ICD-10-CM | POA: Insufficient documentation

## 2016-04-30 DIAGNOSIS — M1711 Unilateral primary osteoarthritis, right knee: Secondary | ICD-10-CM

## 2016-04-30 DIAGNOSIS — M1731 Unilateral post-traumatic osteoarthritis, right knee: Secondary | ICD-10-CM

## 2016-04-30 NOTE — Progress Notes (Signed)
Chief Complaint: Right knee pain    HPI:  Kent Jordan is a 65 y.o.-year-old male referred by Dr. Cherly Hensen for evaluation and treatment of right knee pain/arthritis.  The patient injured his knee in March, twisting the knee and feeling a pop, later diagnosed with a medial meniscal tear.  He underwent arthroscopic surgery on March 21, 2016, approximately 6 weeks ago, and was found intraoperatively to have advanced arthritis of the medial compartment, with an osteochondral defect of the medial femoral condyle.  He recovered well from the surgery without competitions, but has had persistently worsening medial compartment pain and which is at the point where it is functionally disabling, causing him to limp severely, using a cane, failing to improve with the use of multiple different nonsteroidal anti-inflammatory drugs including Celebrex, ibuprofen, naproxen, and also tried Tylenol.  Previous corticosteroid injection of the knee was not effective. He has tried physical therapy, which has also been fairly limited in relief.  He is hoping to move forward with knee replacement surgery.  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  He is also on Enbrel for psoriatic arthritis.    PMH:    Past Medical History   Diagnosis Date   . Back pain    . Hypertensive disorder    . High cholesterol    . Failed back syndrome    . Pneumonia 11/2009   . Bilateral cataracts      removed 2009   . Neuropathy      Tingling left foot, numbness left toes   . Low back pain    . Difficulty in walking(719.7)      Uses cane to ambulate   . Elevated liver function tests    . 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 Psoriasis    . Arthritis      psoriatic and osteoarthritis    . Blood disorder 11/2014     Hemochromatosis   . Psoriatic arthritis    . Sleep apnea      Does not use CPAP   . Arrhythmia      when on ARB       Social History:   Social  History   Substance Use Topics   . Smoking status: Former Smoker -- 1.00 packs/day for 41 years     Quit date: 09/26/2011   . Smokeless tobacco: Never Used   . Alcohol Use: 1.2 oz/week     4 Cans of beer per week       Family History:    Family History   Problem Relation Age of Onset   . Heart disease Mother    . Heart disease Father        Past Surgical History:    Past Surgical History   Procedure Laterality Date   . Lumbar fusion  10/2011   . Microdiscectomy lumbar  11/12     patient had it done 10/12 11/12   . Knee arthrocentesis  1983     right   . Abdominal surgery  03/1971     double hernia repair   . Eye surgery  2009     cataract removal, both eyes    . Back surgery  9/12, 11/12, 2/13     + L-3 to S-1 fusion   . Fracture surgery  1993     right middle finger   .  Insertion, spinal cord stimulator generator N/A 05/07/2014     Procedure: DORSAL COLUMN STIMULATOR PLACEMENT;  Surgeon: Tera Helper, MD;  Location: [facility];  Service: Neurosurgery;  Laterality: N/A;  DCS PLACEMENT   . Septoplasty  10/05/2015   . Hernia repair  03/1071   . Skin biopsy  11/2015     Negative   . Spine surgery  04/2014     Stimulator implant   . Arthroscopy, knee Right 03/21/2016     Procedure: ARTHROSCOPY, KNEE, RIGHT, PARTIAL MEDIAL MENISCECTOMY, PARTIAL SYNOVECTOMY;  Surgeon: Verner Chol, MD;  Location: [facility];  Service: Orthopedics;  Laterality: Right;       Medications:    Current Outpatient Prescriptions   Medication Sig Dispense Refill   . amLODIPine (NORVASC) 5 MG tablet Take 5 mg by mouth daily.        Marland Kitchen aspirin 81 MG chewable tablet Chew 81 mg by mouth daily.     . celecoxib (CELEBREX) 200 MG capsule Take 200 mg by mouth 2 (two) times daily.        . clobetasol (TEMOVATE) 0.05 % external solution Apply topically daily.     . cyclobenzaprine (FLEXERIL) 10 MG tablet Take 10 mg by mouth daily.        . diazepam (VALIUM) 5 MG tablet Take 5 mg by mouth daily.        . DULoxetine (CYMBALTA) 60 MG  capsule Take 60 mg by mouth daily.     Elgie Collard SURECLICK 50 MG/ML Solution Auto-injector once a week.        . folic acid (FOLVITE) 400 MCG tablet Take 400 mcg by mouth daily.     Marland Kitchen FREESTYLE LITE test strip      . hydroCHLOROthiazide (HYDRODIURIL) 25 MG tablet Take 25 mg by mouth daily.        . Lancets (FREESTYLE) lancets      . rosuvastatin (CRESTOR) 5 MG tablet Take 5 mg by mouth daily.     . traZODone (DESYREL) 50 MG tablet Take 50 mg by mouth nightly.        Marland Kitchen ZETIA 10 MG tablet Take 10 mg by mouth daily.            Allergies:    Allergies   Allergen Reactions   . Angiotensin Receptor Blockers Other (See Comments)     ARB combined with Celebrex caused Arrhythmia due to high K+   . Ciprofloxacin Swelling and Anaphylaxis     swelling   . Ace Inhibitors Angioedema   . Gabapentin Other (See Comments)     blurred vision, dizziness       ROS:   All other systems were reviewed and are negative except as previously mentioned in the HPI.    EXAM:   BP 135/83 mmHg  Pulse 80  Wt 98.431 kg (217 lb)  SpO2 95%  Physical Exam   Constitutional: The patient is a 65 y.o. well-developed, well-nourished male in no acute distress.    Neurological: He is alert and oriented to person, place, and time. He has normal sensation and normal strength.  Head: Normocephalic and atraumatic.   Pulmonary/Chest: Effort normal. No respiratory distress.   Abdominal: He exhibits no distension.   Psychiatric: Mood and affect normal.     Gait: antalgic   Ambulatory aides: cane  Right Knee examination:  ROM: 2 degrees of Extension to 115 degrees of Flexion  Swelling: mild to moderate synovial swelling  Effusion: none  Tenderness: medial joint line and lateral patellar facet  Angulation: mild varus   Strength: limited by pain  Ligament Testing: no laxity  Thompson's Test: negative  Pedal pulses palpable bilaterally, with <2sec capillary refill of the foot/toes  Skin: No rashes, or erythema of the legs.  There is no sign of venous stasis  disease.    XRAYS: New x-rays performed of the right knee today, 3 views, and a standing AP bilateral view, were compared to previous x-rays from December 01, 2015, and reveal advanced arthritis of the medial compartment with bone-on-bone changes, a fairly large osteochondral defect of medial femoral condyle, subchondral sclerosis, and mild varus angulation.  There are moderate degenerative changes affecting the patellofemoral performance, more so at the lateral facet, with mild lateral subluxation and tilting.  The lateral compartment appears to be fairly well preserved.    Other studies were also reviewed, including bone scan that shows significantly increased uptake at the medial aspect of the right knee, and intraoperative pictures taken by Dr. Cherly Hensen on June 28 were also reviewed, also revealing advanced medial compartment arthritis with an osteochondral defect of the medial femoral condyle, degenerative meniscal tearing, grade 1 chondrosis of the lateral tibial plateau, and moderate chondromalacia patella.    ASSESSMENT/PLAN: Advanced right knee osteoarthritis, having failed reasonable nonsurgical treatment  Due to the failure of nonsurgical treatment in alleviating the patient's pain and dysfunction, the patient would like to move forward with right total knee replacement, after having been explained the risks and potential benefits of the procedure. The risks include, but are not limited to: bleeding, transfusion, infection, fracture, stiffness, nerve or blood vessel injury, local incisional numbness, pain, blood clots, heart attack, heart arrythmia, stroke, death, and need for future surgery. The patient understands these risks and wishes to proceed. He will need preoperative medical clearance and surgery will be scheduled when appropriate, and will return for a preoperative visit prior to surgery.  I will also ask him to hold his Enbrel dosing for at least 3 weeks prior to surgery, and 4 weeks after, in order  to reduce the risk of infection.

## 2016-04-30 NOTE — PT/OT Therapy Note (Signed)
DAILY NOTE   04/30/2016        Total Treatment (billable)Time: 25 min   Total Timed Minutes:  25 Visit Number:  10      Payor: [employer] / Plan: [employer] / Product Type: *No Product type* /    # of Authorized Visits: 18 Visit #: 10      Diagnosis (Treating/Medical):     ICD-10-CM    1. Acute pain of right knee M25.561    2. S/P medial meniscectomy of right knee Z98.890            Subjective:  Kent Jordan reports he saw his Orthopedic Surgeon and his TKA is scheduled for 9/19; MD suggested hold off on PT until after surgery at this point    Objective:   Treatment:  Therapeutic Exercise: to improve: Flexibility/ROM, Stabilization and Strength   W/U with AROM with level 1 RHs x 10 min and swelling reduction- performed with subj  Review of DOMs and exercise soreness and discussion of exercise theory and progression with walking in pool,     Patient education regarding performing strengthening and mobility exercises, practicing functional activities such as stairs and transfers prior to surgery to improve outcomes after surgery    Reviewed:  Self STM with  the stick, tennis ball  Standing gastroc/soleus stretch - discussed keeping heel on ground and knee straight     Modifications/Patient Education:  Verbal, Visual and Tactile cues     NMR:   n/a    Therapeutic Activities:  n/a    Manual Therapy:   N/a    Initial Evaluation Reference and/orCurrent Measurements (ROM, Strength, Girth, Outcomes, etc.):   ROM: - 1 - 128     Assessment (response to treatment):   Pt demonstrates good understanding of HEP to maintain mobility and strength prior to surgery; all questions answered for prior/after surgery regarding PT.    Progress towards functional goals:   Date  (Body Area, Impairment Goal, Functional     Activity, Target Performance)  Time Frame  Status  Date/   Initial    03/26/2016   Increase knee flexion AROM to 135 degrees to allow patient to safely negotiate stairs reciprocally with railing.   12 sessions  Progressing 128 04/23/16  EBW   03/26/2016    Increase quadriceps strength <4/5 for safe ascending stairs/descending stairs reciprocally.   18 sessions      03/26/2016    Improve LEFS to >75%   18 sessions      03/26/2016    Patient will demonstrate independence in prescribed HEP with proper form, sets and reps for safe discharge to an independent program.   18 sessions  indep thusfar  04/12/16 EBW     Patient requires continued skilled care to: continue to progress LE mobility, ROM, strength and functional gait     Plan:  Discharged from P.T./O.T. to HEP for pre-surgey mobility and strengthening     Charlies Silvers Sickle PT, DPT, CFMT [phone]      04/30/2016

## 2016-05-03 ENCOUNTER — Inpatient Hospital Stay: Payer: BLUE CROSS/BLUE SHIELD | Admitting: Rehabilitative and Restorative Service Providers"

## 2016-05-07 ENCOUNTER — Inpatient Hospital Stay: Payer: BLUE CROSS/BLUE SHIELD | Admitting: Rehabilitative and Restorative Service Providers"

## 2016-05-10 ENCOUNTER — Inpatient Hospital Stay: Payer: BLUE CROSS/BLUE SHIELD | Admitting: Rehabilitative and Restorative Service Providers"

## 2016-05-14 ENCOUNTER — Inpatient Hospital Stay: Payer: BLUE CROSS/BLUE SHIELD | Admitting: Rehabilitative and Restorative Service Providers"

## 2016-05-15 ENCOUNTER — Encounter: Payer: Self-pay | Admitting: Family

## 2016-05-17 ENCOUNTER — Inpatient Hospital Stay: Payer: BLUE CROSS/BLUE SHIELD | Admitting: Rehabilitative and Restorative Service Providers"

## 2016-05-21 ENCOUNTER — Inpatient Hospital Stay: Payer: BLUE CROSS/BLUE SHIELD | Admitting: Rehabilitative and Restorative Service Providers"

## 2016-05-24 ENCOUNTER — Inpatient Hospital Stay: Payer: BLUE CROSS/BLUE SHIELD | Admitting: Rehabilitative and Restorative Service Providers"

## 2016-05-24 ENCOUNTER — Encounter: Payer: Self-pay | Admitting: Family

## 2016-05-24 ENCOUNTER — Ambulatory Visit: Payer: BLUE CROSS/BLUE SHIELD | Attending: Orthopaedic Surgery

## 2016-05-24 DIAGNOSIS — M1731 Unilateral post-traumatic osteoarthritis, right knee: Secondary | ICD-10-CM

## 2016-05-24 DIAGNOSIS — Z01812 Encounter for preprocedural laboratory examination: Secondary | ICD-10-CM | POA: Insufficient documentation

## 2016-05-24 LAB — CBC AND DIFFERENTIAL
Absolute NRBC: 0 10*3/uL
Basophils Absolute Automated: 0.07 10*3/uL (ref 0.00–0.20)
Basophils Automated: 1 %
Eosinophils Absolute Automated: 0.18 10*3/uL (ref 0.00–0.70)
Eosinophils Automated: 2.6 %
Hematocrit: 48.4 % (ref 42.0–52.0)
Hgb: 16.9 g/dL (ref 13.0–17.0)
Immature Granulocytes Absolute: 0.03 10*3/uL
Immature Granulocytes: 0.4 %
Lymphocytes Absolute Automated: 2.41 10*3/uL (ref 0.50–4.40)
Lymphocytes Automated: 35.2 %
MCH: 31.4 pg (ref 28.0–32.0)
MCHC: 34.9 g/dL (ref 32.0–36.0)
MCV: 90 fL (ref 80.0–100.0)
MPV: 9.6 fL (ref 9.4–12.3)
Monocytes Absolute Automated: 0.68 10*3/uL (ref 0.00–1.20)
Monocytes: 9.9 %
Neutrophils Absolute: 3.48 10*3/uL (ref 1.80–8.10)
Neutrophils: 50.9 %
Nucleated RBC: 0 /100 WBC (ref 0.0–1.0)
Platelets: 244 10*3/uL (ref 140–400)
RBC: 5.38 10*6/uL (ref 4.70–6.00)
RDW: 12 % (ref 12–15)
WBC: 6.85 10*3/uL (ref 3.50–10.80)

## 2016-05-24 LAB — BASIC METABOLIC PANEL
BUN: 16 mg/dL (ref 9.0–28.0)
CO2: 24 mEq/L (ref 21–30)
Calcium: 9.4 mg/dL (ref 8.5–10.5)
Chloride: 103 mEq/L (ref 100–111)
Creatinine: 0.9 mg/dL (ref 0.5–1.5)
Glucose: 179 mg/dL — ABNORMAL HIGH (ref 70–100)
Potassium: 4.1 mEq/L (ref 3.5–5.1)
Sodium: 137 mEq/L (ref 135–146)

## 2016-05-24 LAB — URINALYSIS
Bilirubin, UA: NEGATIVE
Blood, UA: NEGATIVE
Glucose, UA: NEGATIVE
Ketones UA: NEGATIVE
Leukocyte Esterase, UA: NEGATIVE
Nitrite, UA: NEGATIVE
Protein, UR: NEGATIVE
Specific Gravity UA: 1.019 (ref 1.001–1.035)
Urine pH: 5.5 (ref 5.0–8.0)
Urobilinogen, UA: 0.2 (ref 0.2–2.0)

## 2016-05-24 LAB — PT AND APTT
PT INR: 1 (ref 0.9–1.1)
PT: 12.8 s (ref 12.6–15.0)
PTT: 26 s (ref 23–37)

## 2016-05-24 LAB — HEMOLYSIS INDEX: Hemolysis Index: 7 (ref 0–18)

## 2016-05-24 LAB — GFR: EGFR: >60

## 2016-05-24 NOTE — H&P (Signed)
PREOP HISTORY AND PHYSICAL EXAM     ZOX@  Patient Name: Kent Jordan, Kent Jordan  Surgeon(s):  Harriet Masson, MD       Assessment:   65 yo male with history of HTN, Hyperlipidemia, Depression, former smoker, alcohol use, insomnia and rheumatoid arthritis, now here with a  longstanding history of post-traumatic OA of right knee, presents for pre op medical evaluation prior to surgery. Right knee arthroplasty is scheduled with Dr. Josiah Lobo on 06/12/16 at the [facility].  No RCRI and patient has adequate functional capacity (METS 5) and is ASA class 2 with BMI greater than 30. Based on ACC/AHA 2014 guidelines, no additional CV testing is needed. Patient is at acceptable risk to proceed with planned procedure pending receipt of acceptable testing results per anesthesia testing grid.              Pre op evaluation:                                        No cardiac symptoms.                                                                               Functional capacity is 5 Mets                                          HTN: uncontrolled with amlodipine and Hctz. Indicated that BP is being managed by cardiology.                                          Lexiscan Stress test, 10/18/15: normal myocardial perfusion. Normal left ventricular systolic function. No prior studies for comparison.                                          Echocardiogram, 10/17/15 : normal echo. LVEF 60%. NWMA.                                          Hyperlipidemia: controlled with crestor and zedia.                                          Depression: currently controlled with cymbalta, diazepam                                          Rheumatoid arthritis: controlled with enbrel  Insomnia: controlled with trazodone.                                         Former smoke: one pack a day for 41 years, quit date 09/26/2011                                          H/O Angioedema: due to long term use of Ace                                                                                Alcohol Use : 1-2 oz of wine/weekly. LD was on 05/19/16                                        Renal insufficiency:         Patient denies any exertional  chest pain, shortness of breath, palpitations, dizziness, lightheadedness, syncope and claudication. He has no cardiopulmonary symptoms. He is afebrile.    Patient's medical and surgical history, medication reconciliation and family history were reviewed as well as a complete physical exam was performed. Recent laboratory results were obtained as well as an ECG recorded and read by me, which are being attached. EKG is NSR with ventricular rate of ......, no ischemic changes    Plan:     Impression/Plan-     -Uncontrolled HTN: Blood pressure is elevated  and I would like a better B/P control prior to surgery. Patient advised to limit salt intake and take all B/P medications including amlodipine and Hctz daily as prescribed. Advised to schedule appointment with cardiology asap and to f/u after the initial visit to confirm that BP is well controlled prior to surgery.    -Tobacco use: patient quit 09/26/2011. Encourage continued smoking cessation to reduce periop risk.    -I have advised him to hold all nonsteroidal anti-inflammatory drugs and aspirin starting 10 days prior to DOS.    -Encouraged Ice for knee pain or swelling and low impact activities only    -I have sent the following lab testing, CBC, BMP, UA, T/S, MSSA/MRSA, APTT, PT/INR and EKG testing as requested on patient's preoperative evaluation form.    -Recommend patient reduce alcohol consumption to reduce perioperative risk.     -Pre op evaluation-ok to proceed with intermediate risk surgery pending receipt of acceptable testing results per anesthesia testing grid.      Discussed the importance of addressing and managing traditional modifiable cardiac risk factors (lipids,  blood pressure, smoking, obesity, sedentary lifestyle) in order to reduce the risk of cardiovascular disease, CVD (heart attacks, heart failure, strokes, and peripheral arterial disease).         History of Present Illness:   I had the pleasure of seeing Kent Jordan today for preoperative evaluations. He is a 65 year old male with history of HTN, Hyperlipidemia, Depression, former smoker, alcohol use, insomnia and rheumatoid  arthritis  who is scheduled for right knee replacement surgery on 06/12/16 at [facility] with Dr. Josiah Lobo.His rt. knee is getting worse and worse and this is affecting his quality of life.  The current episode started last march after he twisted his right knee while walking up the stairs and then heard a popping sound. He was later diagnosed with meniscal tear. Had arthroscopic surgery done on 03/21/16.  The problem has been gradually getting worse since onset. The pain is present in the right calf. The quality of the pain is described as aching. The pain does not radiate. The pain is moderate. The pain is worse during the day and mostly with activities. The symptoms are aggravated by positioning, standing and sitting. He indicated that non surgical treatments had failed to alleviate his pain and so decided to have it surgically repaired.  Risk factors; no history of surgical complications, no easy bleeding tendency, no history of blood clots, no history of allergic reaction to anesthetic agents.   Current symptoms; no unusual fatigue, normal apetite, no fever, no chest pain, no palpitations, no unexplained syncope, no shortness of breath, no reduction in exercise tolerance due to chest pain or dyspnea, no upper respiratory infection, no wheezing, no atypical bleeding, normal bowel function, no dysuria/urinary symptoms.            Procedures:     Procedure(s):  ARTHROPLASTY, KNEE, TOTAL      Date of Procedure:   06/12/2016    Past Medical History:     Past Medical History   Diagnosis Date   . Back  pain    . Failed back syndrome    . Bilateral cataracts      removed 2009   . Low back pain    . Elevated liver function tests    . 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 Psoriasis    . Psoriatic arthritis    . Arthritis      psoriatic and osteoarthritis    . Blood disorder 11/2014     Hemochromatosis   . Difficulty in walking(719.7)      Uses cane to ambulate   . High cholesterol    . Hypertensive disorder    . Arrhythmia      when on ARB   . Sleep apnea      Does not use CPAP   . Disease of lung    . Neuropathy      Tingling left foot, numbness left toes       Past Surgical History:     Past Surgical History   Procedure Laterality Date   . Lumbar fusion  10/2011   . Microdiscectomy lumbar  11/12     patient had it done 10/12 11/12   . Knee arthrocentesis  1983     right   . Abdominal surgery  03/1971     double hernia repair   . Eye surgery  2009     cataract removal, both eyes    . Back surgery  9/12, 11/12, 2/13     + L-3 to S-1 fusion   . Fracture surgery  1993     right middle finger   . Insertion, spinal cord stimulator generator N/A 05/07/2014     Procedure: DORSAL COLUMN STIMULATOR PLACEMENT;  Surgeon: Tera Helper, MD;  Location: [facility];  Service: Neurosurgery;  Laterality: N/A;  DCS PLACEMENT   . Septoplasty  10/05/2015   .  Hernia repair  03/1071   . Skin biopsy  11/2015     Negative   . Spine surgery  04/2014     Stimulator implant   . Arthroscopy, knee Right 03/21/2016     Procedure: ARTHROSCOPY, KNEE, RIGHT, PARTIAL MEDIAL MENISCECTOMY, PARTIAL SYNOVECTOMY;  Surgeon: Verner Chol, MD;  Location: [facility];  Service: Orthopedics;  Laterality: Right;       Family History:     Family History   Problem Relation Age of Onset   . Heart disease Mother    . Heart disease Father        Social History:     Social History     Social History   . Marital Status: Married     Spouse Name: N/A   . Number of Children: N/A   . Years of Education: N/A     Social History Main Topics   .  Smoking status: Former Smoker -- 1.00 packs/day for 41 years     Quit date: 09/05/2011   . Smokeless tobacco: Never Used   . Alcohol Use: 1.2 oz/week     4 Cans of beer per week   . Drug Use: No   . Sexual Activity:     Partners: Female     Pharmacist, hospital Protection: Surgical     Other Topics Concern   . Not on file     Social History Narrative       Allergies:     Allergies   Allergen Reactions   . Angiotensin Receptor Blockers Other (See Comments)     ARB combined with Celebrex caused Arrhythmia due to high K+   . Ciprofloxacin Swelling and Anaphylaxis     swelling   . Ace Inhibitors Angioedema   . Gabapentin Other (See Comments)     blurred vision, dizziness       Medications:     No prescriptions prior to admission       Review of Systems:   Review of Systems - History obtained from the patient  General ROS: negative  Respiratory ROS: no cough, shortness of breath, or wheezing  Cardiovascular ROS: no chest pain or dyspnea on exertion  Gastrointestinal ROS: no abdominal pain, change in bowel habits, or black or bloody stools  Musculoskeletal ROS: positive for - joint pain  Neurological ROS: no TIA or stroke symptoms      Physical Exam:     Filed Vitals:    05/24/16 0906   Pulse: 71   Temp: 36.4 C (97.5 F)   Resp: 20   SpO2: 98%       Intake and Output Summary (Last 24 hours) at Date Time  No intake or output data in the 24 hours ending 05/24/16 0948    General appearance - alert, well appearing, and in no distress  Mental status - alert, oriented to person, place, and time  Chest - clear to auscultation, no wheezes, rales or rhonchi, symmetric air entry  Heart - normal rate, regular rhythm, normal S1, S2, no murmurs, rubs, clicks or gallops  Abdomen - soft, nontender, nondistended, no masses or organomegaly  Back exam - full range of motion, no tenderness, palpable spasm or pain on motion  Neurological - alert, oriented, normal speech, no focal findings or movement disorder noted  Musculoskeletal - abnormal  exam of right knee. Decreased ROM  Extremities - peripheral pulses normal, no pedal edema, no clubbing or cyanosis    Labs:  Admission on 03/21/2016, Discharged on 03/21/2016   Component Date Value Ref Range Status   . Body Fluid WBC 03/21/2016 365   Final    Comment: Synovial: WBC Count: <200 cells/ul  Peritoneal/Pleural/Pericardial: WBC Count: <500 cells/ul     . Body Fluid RBC 03/21/2016 9000  None Estab. /cumm Final   . Body Fluid Source: 03/21/2016 Synovial   Final   . Body Fluid Polymorphonuclear Cell 03/21/2016 17   Final    Comment: Neutrophils:<25%  Lymphocytes and Monocytes: Variable  Due to the difficulty in obtaining normal body fluid  samples, the reference ranges are from published  literature.     . Body Fluid Lymphocytes 03/21/2016 43   Final   . Body Fluid Mononuclear Cells 03/21/2016 40   Final   . Body Fluid Other Cell 03/21/2016 see below   Final    Comment: -  Synovial lining cells:few     . Body Fluid Path Review 03/21/2016 See Note   Final    Comment:  03/21/2016  11:02 Electronically reviewed and signed:  Sherlon Handing, M.D.     Office Visit on 03/05/2016   Component Date Value Ref Range Status   . CRYSTAL ID,SYNOVIAL FLUID 03/05/2016    None Seen Final    No crystals found   . Culture, Aerobic 03/05/2016     Final    Comment:   CULTURE, AEROBIC BACTERIA     MICRO NUMBER:      [id number]    TEST STATUS:       FINAL    SPECIMEN SOURCE:    R KNEE    SPECIMEN QUALITY:  ADEQUATE    RESULT:            No Growth     . Gram Stain Smear 03/05/2016     Final    Comment:   GRAM STAIN     MICRO NUMBER:      [id number]    TEST STATUS:       FINAL    SPECIMEN SOURCE:    R KNEE    SPECIMEN QUALITY:  ADEQUATE    GRAM STAIN:        No organisms or white blood cells seen     . Culture,Anaerobic w/Gram Stain 03/05/2016     Final    Comment:   CULTURE, ANAEROBIC BACTERIA     MICRO NUMBER:      [id number]    TEST STATUS:       FINAL    SPECIMEN SOURCE:    R KNEE      SPECIMEN QUALITY:  ADEQUATE    RESULT:             No anaerobes isolated.     Orders Only on 03/05/2016   Component Date Value Ref Range Status   . Color, Body Fluid 03/05/2016 Red* Yellow Final   . Clarity, Fluid 03/05/2016 Slightly hazy  Clear Final   . Nucl Cell, Fluid 03/05/2016 158  0 - 200 cells/uL Final   . RBC, Fluid 03/05/2016 Many  Not Estab. Michelle Piper Final   . Polys, Fluid 03/05/2016 6  Not Estab. % Final   . Lymphs, Fluid 03/05/2016 22  Not Estab. % Final   . MACROPHAGE 03/05/2016 72  Not Estab. % Final   . Eos, Fluid 03/05/2016 0  Not Estab. % Final   . Synovial Lining Cells 03/05/2016 0  Not Estab. % Final   . Crystals, Joint Fluid 03/05/2016 Comment  None seen  Final    Comment: No crystals seen under normal or polarized light.  Results verified by concentration technique.       05/24/2016 10:06 AM - Interface, Lab In [facility]      Component Results     Component Value Ref Range & Units Status    WBC 6.85 3.50 - 10.80 x10 3/uL Final    Hgb 16.9 13.0 - 17.0 g/dL Final    Hematocrit 60.4 42.0 - 52.0 % Final    Platelets 244 140 - 400 x10 3/uL Final    RBC 5.38 4.70 - 6.00 x10 6/uL Final    MCV 90.0 80.0 - 100.0 fL Final    MCH 31.4 28.0 - 32.0 pg Final    MCHC 34.9 32.0 - 36.0 g/dL Final    RDW 12 12 - 15 % Final    MPV 9.6 9.4 - 12.3 fL Final    Neutrophils 50.9 None % Final    Lymphocytes Automated 35.2 None % Final    Monocytes 9.9 None % Final    Eosinophils Automated 2.6 None % Final    Basophils Automated 1.0 None % Final    Immature Granulocyte 0.4 None % Final    Nucleated RBC 0.0 0.0 - 1.0 /100 WBC Final    Neutrophils Absolute 3.48 1.80 - 8.10 x10 3/uL Final    Abs Lymph Automated 2.41 0.50 - 4.40 x10 3/uL Final    Abs Mono Automated 0.68 0.00 - 1.20 x10 3/uL Final    Abs Eos Automated 0.18 0.00 - 0.70 x10 3/uL Final    Absolute Baso Automated 0.07 0.00 - 0.20 x10 3/uL Final    Absolute Immature Granulocyte 0.03 0 x10 3/uL Final    Absolute NRBC 0.00 0 x10 3/uL Final        05/24/2016 11:07 AM - Interface, Lab In [facility]       Component Results     Component Value Ref Range & Units Status    Glucose 179 (H) 70 - 100 mg/dL Final    ADA guidelines for diabetes mellitus:   Fasting: Equal to or greater than 126 mg/dL   Random:  Equal to or greater than 200 mg/dL       BUN 54.0 9.0 - 98.1 mg/dL Final    Creatinine 0.9 0.5 - 1.5 mg/dL Final    Calcium 9.4 8.5 - 10.5 mg/dL Final    Sodium 191 478 - 146 mEq/L Final    Potassium 4.1 3.5 - 5.1 mEq/L Final    Chloride 103 100 - 111 mEq/L Final    CO2 24 21 - 30 mEq/L Final          05/24/2016 12:51 PM - Interface, Lab In [facility]      Component Results     Component Value Ref Range & Units Status    PT 12.8 12.6 - 15.0 sec Final    PT INR 1.0 0.9 - 1.1 Final    Recommended Ranges for Protime INR:    2.0-3.0 for most medical and surgical thromboembolic states    2.5-3.5 for artificial heart valves   INR result may not represent exact Warfarin dosing level during   the transition period from Heparin to Warfarin therapy.   Recommend close clinical monitoring.       PT Anticoag. Given Within 48 hrs. None  Final    PTT 26 23 - 37 sec Final    In vivo therapeutic range of heparin (0.3 - 0.7 IU/mL)  correlate with the following APTT times: 64 - 102 seconds.           05/24/2016 10:21 AM - Interface, Lab In [employer]      Component Results     Component Value Ref Range & Units Status    Urine Type Clean Catch  Final    Color, UA YELLOW Clear - Yellow Final    Clarity, UA CLEAR Clear - Hazy Final    Specific Gravity UA 1.019 1.001-1.035 Final    Urine pH 5.5 5.0-8.0 Final    Leukocyte Esterase, UA NEGATIVE Negative Final    Nitrite, UA NEGATIVE Negative Final    Protein, UR NEGATIVE Negative Final    Glucose, UA NEGATIVE Negative Final    Ketones UA NEGATIVE Negative Final    Urobilinogen, UA 0.2 0.2-2.0 Final    Bilirubin, UA NEGATIVE Negative Final    Blood, UA NEGATIVE Negative Final              Signed by: Sherlean Foot Rickiya Picariello

## 2016-05-25 ENCOUNTER — Ambulatory Visit: Payer: BLUE CROSS/BLUE SHIELD

## 2016-05-25 LAB — HEMOGLOBIN A1C
Average Estimated Glucose: 151.3 mg/dL
Hemoglobin A1C: 6.9 % — ABNORMAL HIGH (ref 4.6–5.9)

## 2016-05-25 NOTE — Pre-Procedure Instructions (Addendum)
   05/24/16- Allyne Gee, NP for medical clearance. Pt BP was 145/93 and pt said he needs to go to cardiologist for review.  Pt has appt w/ Dr. Morrie Sheldon 06/02/16   11/2015- LOV w/ card.  Sent fax req LOV notes and any recent testing.   05/24/16- pre-op labs WDL.  Pt still needs TS.  He will come to [facility] after his appt w/ Dr. Morrie Sheldon 06/02/16. Pt will ask for report to be faxed to [facility] [phone].   Pt attended joint class

## 2016-05-25 NOTE — Pre-Procedure Instructions (Addendum)
Reviewed 05/24/16 labs.  HgbA1c pending.  PSS  NP made aware BS 179.

## 2016-05-26 NOTE — Pre-Procedure Instructions (Signed)
   HgbA1c results reviewed.

## 2016-05-29 ENCOUNTER — Ambulatory Visit (INDEPENDENT_AMBULATORY_CARE_PROVIDER_SITE_OTHER): Payer: Self-pay | Admitting: Nurse Practitioner

## 2016-05-30 ENCOUNTER — Ambulatory Visit
Admission: RE | Admit: 2016-05-30 | Discharge: 2016-05-30 | Disposition: A | Discharge status: Home / Self Care | Payer: BLUE CROSS/BLUE SHIELD | Source: Ambulatory Visit | Attending: Internal Medicine | Admitting: Internal Medicine

## 2016-05-30 ENCOUNTER — Encounter (INDEPENDENT_AMBULATORY_CARE_PROVIDER_SITE_OTHER): Payer: Self-pay

## 2016-05-30 DIAGNOSIS — E119 Type 2 diabetes mellitus without complications: Secondary | ICD-10-CM | POA: Insufficient documentation

## 2016-05-30 DIAGNOSIS — I10 Essential (primary) hypertension: Secondary | ICD-10-CM | POA: Insufficient documentation

## 2016-05-30 DIAGNOSIS — E785 Hyperlipidemia, unspecified: Secondary | ICD-10-CM | POA: Insufficient documentation

## 2016-05-30 LAB — CBC AND DIFFERENTIAL
Absolute NRBC: 0 10*3/uL
Basophils Absolute Automated: 0.06 10*3/uL (ref 0.00–0.20)
Basophils Automated: 0.9 %
Eosinophils Absolute Automated: 0.21 10*3/uL (ref 0.00–0.70)
Eosinophils Automated: 3.1 %
Hematocrit: 48.3 % (ref 42.0–52.0)
Hgb: 16.5 g/dL (ref 13.0–17.0)
Immature Granulocytes Absolute: 0.02 10*3/uL
Immature Granulocytes: 0.3 %
Lymphocytes Absolute Automated: 2.82 10*3/uL (ref 0.50–4.40)
Lymphocytes Automated: 42.1 %
MCH: 31.3 pg (ref 28.0–32.0)
MCHC: 34.2 g/dL (ref 32.0–36.0)
MCV: 91.7 fL (ref 80.0–100.0)
MPV: 10.2 fL (ref 9.4–12.3)
Monocytes Absolute Automated: 0.74 10*3/uL (ref 0.00–1.20)
Monocytes: 11 %
Neutrophils Absolute: 2.85 10*3/uL (ref 1.80–8.10)
Neutrophils: 42.6 %
Nucleated RBC: 0 /100 WBC (ref 0.0–1.0)
Platelets: 257 10*3/uL (ref 140–400)
RBC: 5.27 10*6/uL (ref 4.70–6.00)
RDW: 12 % (ref 12–15)
WBC: 6.7 10*3/uL (ref 3.50–10.80)

## 2016-05-30 LAB — COMPREHENSIVE METABOLIC PANEL
ALT: 89 U/L — ABNORMAL HIGH (ref 0–55)
AST (SGOT): 61 U/L — ABNORMAL HIGH (ref 5–34)
Albumin/Globulin Ratio: 1.2 (ref 0.9–2.2)
Albumin: 4.1 g/dL (ref 3.5–5.0)
Alkaline Phosphatase: 107 U/L — ABNORMAL HIGH (ref 38–106)
BUN: 20 mg/dL (ref 9.0–28.0)
Bilirubin, Total: 0.7 mg/dL (ref 0.1–1.2)
CO2: 26 mEq/L (ref 21–30)
Calcium: 9.9 mg/dL (ref 8.5–10.5)
Chloride: 103 mEq/L (ref 100–111)
Creatinine: 1 mg/dL (ref 0.5–1.5)
Globulin: 3.3 g/dL (ref 2.0–3.7)
Glucose: 185 mg/dL — ABNORMAL HIGH (ref 70–100)
Potassium: 4.3 mEq/L (ref 3.5–5.1)
Protein, Total: 7.4 g/dL (ref 6.0–8.3)
Sodium: 138 mEq/L (ref 135–146)

## 2016-05-30 LAB — HEMOGLOBIN A1C
Average Estimated Glucose: 157.1 mg/dL
Hemoglobin A1C: 7.1 % — ABNORMAL HIGH (ref 4.6–5.9)

## 2016-05-30 LAB — HEMOLYSIS INDEX: Hemolysis Index: 7 (ref 0–18)

## 2016-05-30 LAB — LIPID PANEL
Cholesterol / HDL Ratio: 3.9
Cholesterol: 149 mg/dL (ref 0–199)
HDL: 38 mg/dL — ABNORMAL LOW (ref 40–9999)
LDL Calculated: 88 mg/dL (ref 0–99)
Triglycerides: 115 mg/dL (ref 34–149)
VLDL Calculated: 23 mg/dL (ref 10–40)

## 2016-05-30 LAB — GFR: EGFR: >60

## 2016-05-30 NOTE — Pre-Procedure Instructions (Addendum)
Informed patient to come to [facility] NP for BP recheck after his preop appointment with Dr. Josiah Lobo and T/S on 06/07/16. Patient agreed and stated that he will ask the lab technicians to bring him back for BP recheck with NP on 06/07/2016..      Addendum @ 1155: Dr. Josiah Lobo informed via email that patient was seen by cardiologist for BP f/u on 05/29/16 with medication adjustment done. Informed that patient will f/u with NP on 06/07/16 after his preop and T/S appointment for BP recheck.    Addendum @ 1434: Lab Tech, please send patient to NP on 06/07/16 after T/S for BP recheck.

## 2016-06-01 NOTE — Pre-Procedure Instructions (Addendum)
Spoke to Patient regarding elevated A1c of 7.1 and FBG of 185. He indicated that he was previously diagnosed with Diabetes last year and placed on glipizide per PCP. He indicated that he reacted to glipizide with dizziness and hypoglycemia and so PCP discontinued medication. Requested that patient schedule an appointment with his PCP for diabetes management. Patient agreed to call his PCP today for an appointment and will call me back with appointment date and time.    Addendum @ 1615: Spoke to [place] at [employer] office and informed her that patient was seen last week at [facility] for preop clearance and was found to be diabetic. Informed of abnormal a1c and FBG as indicated above. Maryelizabeth Kaufmann indicated that patient had called and scheduled appointment to be seen on 06/08/16 at 1345. Informed that he is scheduled for surgery on 06/12/16. Will fax lab results to [phone].      Addendum @ 1627: Faxed lab results as indicated above to Dr Casimiro Needle, [facility] Office at [phone].      Addendum @ 5784: Informed Dr. Josiah Lobo via email of patient's elevated A1c and FBG with no treatment as indicated above and that appointment has been scheduled with PCP on 06/08/16. cc'ed [facility].

## 2016-06-04 NOTE — Pre-Procedure Instructions (Signed)
Receivd an email from Dr. Josiah Lobo indicating that surgery will be postponed for 3 months till patient's diabetes is well optimized since he has history of psoriasis with enbrel usage.

## 2016-06-07 ENCOUNTER — Encounter (INDEPENDENT_AMBULATORY_CARE_PROVIDER_SITE_OTHER): Payer: Self-pay | Admitting: Orthopaedic Surgery

## 2016-06-07 ENCOUNTER — Ambulatory Visit (INDEPENDENT_AMBULATORY_CARE_PROVIDER_SITE_OTHER): Payer: BLUE CROSS/BLUE SHIELD | Admitting: Orthopaedic Surgery

## 2016-06-07 VITALS — BP 136/88 | HR 83 | Wt 213.0 lb

## 2016-06-07 DIAGNOSIS — M1731 Unilateral post-traumatic osteoarthritis, right knee: Secondary | ICD-10-CM

## 2016-06-07 DIAGNOSIS — E119 Type 2 diabetes mellitus without complications: Secondary | ICD-10-CM

## 2016-06-07 NOTE — Progress Notes (Signed)
Chief Complaint: Right knee pain     HPI:  Kent Jordan is a 65 y.o.-year-old male here for here for their presurgical evaluation prior to Right total knee replacement scheduled for next week. Pre-operative medical testing, however, revealed that the patient has untreated diabetes, with an A1c of 7.1, in addition to his existing risk factors for infection of psoriasis, with the use of the immune modulating medication (Enbrel).    PMH:    Past Medical History:   Diagnosis Date   . Arrhythmia     when on ARB   . Arthritis     psoriatic and osteoarthritis    . Back pain    . Blood disorder 11/2014    Hemochromatosis   . Diabetes mellitus 2016    Typed II    . Difficulty in walking(719.7)     Uses cane to ambulate   . Elevated liver function tests    . Failed back syndrome    . High cholesterol    . Hypertensive disorder    . Low back pain    . Lung nodule 2012    signed off from pulmonology f/u's   . Neuropathy     Tingling left foot, numbness left toes   . Psoriasis    . Psoriatic arthritis    . Sleep apnea     Does not use CPAP, less snoring since turboplasty       MEDS:    Current Outpatient Prescriptions   Medication Sig Dispense Refill   . AFLURIA QUADRIVALENT IM injection      . amLODIPine (NORVASC) 5 MG tablet Take 5 mg by mouth daily.        Marland Kitchen aspirin 81 MG chewable tablet Chew 81 mg by mouth daily.     . celecoxib (CELEBREX) 200 MG capsule Take 200 mg by mouth 2 (two) times daily.        . clobetasol (TEMOVATE) 0.05 % external solution Apply topically daily.         . cyclobenzaprine (FLEXERIL) 10 MG tablet Take 10 mg by mouth daily.        . diazepam (VALIUM) 5 MG tablet Take 5 mg by mouth daily.        . DULoxetine (CYMBALTA) 60 MG capsule Take 60 mg by mouth daily.     Elgie Collard SURECLICK 50 MG/ML Solution Auto-injector once a week.        . folic acid (FOLVITE) 400 MCG tablet Take 400 mcg by mouth daily.     Marland Kitchen FREESTYLE LITE test strip      . hydroCHLOROthiazide (HYDRODIURIL) 25 MG tablet Take 25 mg by  mouth daily.        . Lancets (FREESTYLE) lancets      . rosuvastatin (CRESTOR) 5 MG tablet Take 5 mg by mouth daily.     . traZODone (DESYREL) 50 MG tablet Take 50 mg by mouth nightly as needed.         Marland Kitchen ZETIA 10 MG tablet Take 10 mg by mouth daily.        . calcipotriene (DOVONOX) 0.005 % cream 1 APP 2 TIMES A DAY APPLIED TOPICALLY 30 DAYS  2   . clobetasol (TEMOVATE) 0.05 % cream 1 APP 2 TIMES A DAY TO AFFECTED AREAS APPLIED TOPICALLY 30 DAYS  2       ROS:   All other systems were reviewed and are negative except as previously mentioned in the HPI.    EXAM:  BP 136/88 (BP Site: Right arm, Patient Position: Sitting, Cuff Size: Large)   Pulse 83   Wt 96.6 kg (213 lb)   SpO2 98%   BMI 30.56 kg/m   Physical Exam   Constitutional: The patient is a 65 y.o. well-developed, well-nourished male in no acute distress.  Accompanied by his supportive wife.   Neurological: He is alert and oriented to person, place, and time. He has normal sensation and normal strength.  Head: Normocephalic and atraumatic.   Pulmonary/Chest: Effort normal. No respiratory distress.   Abdominal: He exhibits no distension.   Psychiatric: Mood and affect normal.   Gait: antalgic   Ambulatory aides: cane    ASSESSMENT/PLAN: Advanced right knee arthritis, having failed reasonable nonsurgical treatment as well as untreated diabetes mellitus.  The above findings were discussed with the patient today at length.  He is understandably disappointed that I am temporarily postponing his surgery until his diabetes can be appropriately treated.  The rationale, however, was discussed at length, primarily the importance of reducing the risk of infection and wound healing problems, particularly in light of his additional risk factors of psoriasis and the use of immune modulating medication.  I have rescheduled his case tentatively for September 03, 2016, pending appropriate treatment of his diabetes.  We will follow his progress.  I will see him back for a  preoperative evaluation before the next surgical date.

## 2016-06-07 NOTE — Pre-Procedure Instructions (Signed)
   Email to posting to clarify 9/11 [facility] NP note regarding postponing case for three months.

## 2016-06-24 DIAGNOSIS — I10 Essential (primary) hypertension: Secondary | ICD-10-CM

## 2016-06-24 HISTORY — DX: Essential (primary) hypertension: I10

## 2016-06-27 ENCOUNTER — Ambulatory Visit (INDEPENDENT_AMBULATORY_CARE_PROVIDER_SITE_OTHER): Payer: BLUE CROSS/BLUE SHIELD | Admitting: Orthopaedic Surgery

## 2016-06-28 ENCOUNTER — Ambulatory Visit (INDEPENDENT_AMBULATORY_CARE_PROVIDER_SITE_OTHER): Payer: BLUE CROSS/BLUE SHIELD | Admitting: Orthopaedic Surgery

## 2016-06-28 ENCOUNTER — Inpatient Hospital Stay: Payer: BLUE CROSS/BLUE SHIELD | Admitting: Rehabilitative and Restorative Service Providers"

## 2016-06-28 ENCOUNTER — Encounter (INDEPENDENT_AMBULATORY_CARE_PROVIDER_SITE_OTHER): Payer: BLUE CROSS/BLUE SHIELD | Admitting: Orthopaedic Surgery

## 2016-06-29 ENCOUNTER — Ambulatory Visit
Admission: RE | Admit: 2016-06-29 | Discharge: 2016-06-29 | Disposition: A | Discharge status: Home / Self Care | Payer: BLUE CROSS/BLUE SHIELD | Source: Ambulatory Visit | Attending: Internal Medicine | Admitting: Internal Medicine

## 2016-06-29 DIAGNOSIS — E119 Type 2 diabetes mellitus without complications: Secondary | ICD-10-CM | POA: Insufficient documentation

## 2016-06-29 LAB — COMPREHENSIVE METABOLIC PANEL
ALT: 76 U/L — ABNORMAL HIGH (ref 0–55)
AST (SGOT): 55 U/L — ABNORMAL HIGH (ref 5–34)
Albumin/Globulin Ratio: 1.4 (ref 0.9–2.2)
Albumin: 4.2 g/dL (ref 3.5–5.0)
Alkaline Phosphatase: 87 U/L (ref 38–106)
BUN: 15 mg/dL (ref 9.0–28.0)
Bilirubin, Total: 0.8 mg/dL (ref 0.1–1.2)
CO2: 26 mEq/L (ref 21–30)
Calcium: 9.7 mg/dL (ref 8.5–10.5)
Chloride: 102 mEq/L (ref 100–111)
Creatinine: 1 mg/dL (ref 0.5–1.5)
Globulin: 2.9 g/dL (ref 2.0–3.7)
Glucose: 123 mg/dL — ABNORMAL HIGH (ref 70–100)
Potassium: 3.9 mEq/L (ref 3.5–5.1)
Protein, Total: 7.1 g/dL (ref 6.0–8.3)
Sodium: 137 mEq/L (ref 135–146)

## 2016-06-29 LAB — GFR: EGFR: >60

## 2016-06-29 LAB — HEMOGLOBIN A1C
Average Estimated Glucose: 134.1 mg/dL
Hemoglobin A1C: 6.3 % — ABNORMAL HIGH (ref 4.6–5.9)

## 2016-06-29 LAB — HEMOLYSIS INDEX: Hemolysis Index: 9 (ref 0–18)

## 2016-07-02 ENCOUNTER — Other Ambulatory Visit: Payer: Self-pay | Admitting: Gastroenterology

## 2016-07-02 ENCOUNTER — Inpatient Hospital Stay: Payer: BLUE CROSS/BLUE SHIELD | Admitting: Rehabilitative and Restorative Service Providers"

## 2016-07-02 DIAGNOSIS — R748 Abnormal levels of other serum enzymes: Secondary | ICD-10-CM

## 2016-07-04 ENCOUNTER — Inpatient Hospital Stay: Payer: BLUE CROSS/BLUE SHIELD | Admitting: Rehabilitative and Restorative Service Providers"

## 2016-07-05 ENCOUNTER — Ambulatory Visit
Admission: RE | Admit: 2016-07-05 | Discharge: 2016-07-05 | Disposition: A | Discharge status: Home / Self Care | Payer: BLUE CROSS/BLUE SHIELD | Source: Ambulatory Visit | Attending: Gastroenterology | Admitting: Gastroenterology

## 2016-07-05 DIAGNOSIS — K760 Fatty (change of) liver, not elsewhere classified: Secondary | ICD-10-CM | POA: Insufficient documentation

## 2016-07-05 DIAGNOSIS — R748 Abnormal levels of other serum enzymes: Secondary | ICD-10-CM

## 2016-07-05 DIAGNOSIS — R945 Abnormal results of liver function studies: Secondary | ICD-10-CM | POA: Insufficient documentation

## 2016-07-05 DIAGNOSIS — K7689 Other specified diseases of liver: Secondary | ICD-10-CM | POA: Insufficient documentation

## 2016-07-06 ENCOUNTER — Inpatient Hospital Stay: Payer: BLUE CROSS/BLUE SHIELD | Admitting: Rehabilitative and Restorative Service Providers"

## 2016-07-09 ENCOUNTER — Inpatient Hospital Stay: Payer: BLUE CROSS/BLUE SHIELD | Admitting: Rehabilitative and Restorative Service Providers"

## 2016-07-11 ENCOUNTER — Inpatient Hospital Stay: Payer: BLUE CROSS/BLUE SHIELD | Admitting: Rehabilitative and Restorative Service Providers"

## 2016-07-11 ENCOUNTER — Encounter (INDEPENDENT_AMBULATORY_CARE_PROVIDER_SITE_OTHER): Payer: Self-pay

## 2016-07-13 ENCOUNTER — Inpatient Hospital Stay: Payer: BLUE CROSS/BLUE SHIELD | Admitting: Rehabilitative and Restorative Service Providers"

## 2016-07-16 ENCOUNTER — Inpatient Hospital Stay: Payer: BLUE CROSS/BLUE SHIELD | Admitting: Rehabilitative and Restorative Service Providers"

## 2016-07-17 ENCOUNTER — Encounter (INDEPENDENT_AMBULATORY_CARE_PROVIDER_SITE_OTHER): Payer: Self-pay

## 2016-07-18 ENCOUNTER — Inpatient Hospital Stay: Payer: BLUE CROSS/BLUE SHIELD | Admitting: Rehabilitative and Restorative Service Providers"

## 2016-07-23 ENCOUNTER — Ambulatory Visit: Payer: BLUE CROSS/BLUE SHIELD

## 2016-07-23 ENCOUNTER — Encounter (INDEPENDENT_AMBULATORY_CARE_PROVIDER_SITE_OTHER): Payer: Self-pay

## 2016-07-23 DIAGNOSIS — Z01818 Encounter for other preprocedural examination: Secondary | ICD-10-CM

## 2016-07-23 NOTE — Pre-Procedure Instructions (Addendum)
   06/26/16 Med Clear by PMD in epic.   05/29/16 EKG ([facility]DL) in epic.   06/29/16 A1C = 6.3, CMP ([facility]DL) glucose = 123, AST 76, ALT 55.   05/30/16 CBC ([facility]), 05/24/16 PT/PTT ([facility]DL), UA neg, MRSA/MSSA n/t neg. Pt states he has not been told by surgeon that any testing needed to be repeated.   Coming to [facility] 08/01/16 for T&S - order placed in epic.   07/02/16 GI notes by Dr. Dorna Bloom in epic - f/u abn LFT's. GI wanted pt to have liver bx but surgeon did not want pt to have any invasive procedures 2 months prior to surgery - abd u/s ordered instead.   07/05/16 Abdominal u/s in epic - showed fatty liver & simple right hepatic cyst.   05/29/16 Cardiac Eval in epic - states acceptable risk pending improved BP. Norvasc was increased to 10 mg daily.   Pt states he's been checking BP's at home & most recent BP 117/73. PMD office visit 06/26/16 BP 122/80.   Pt states he does not have any f/u visits scheduled w/ cardio office prior to surgery. Engineer, civil (consulting) was ok w/ his recent BP readings.   10/18/15 neg ST in epic.   10/17/15 Echo in epic.   Hx hemochromatosis - gets phlebotomy every 3 months. Last done 02/2016. States was told by hematologist Dr. Bing Matter not to have phlebotomy between now and surgery. Fax req for hematology LOV notes.   Previously followed for 3 yrs by pulmonologist for benign lung nodule. States last seen 2015 & was told no further f/u required. Fax req for LOV notes & any testing from Dr. Wilma Flavin.   Dental Clear done last week at Dr. Marta Lamas office [phone]). Carollee Herter at that office states Clear faxed to surgeon and they do not have a copy of clearance on file. Do not yet see Dental Clear in epic. Nav please f/u w/ surgeon during office hrs to make sure they received.   States attended Joint Class 04/2016.

## 2016-08-01 ENCOUNTER — Ambulatory Visit (INDEPENDENT_AMBULATORY_CARE_PROVIDER_SITE_OTHER): Payer: BLUE CROSS/BLUE SHIELD | Admitting: Orthopaedic Surgery

## 2016-08-01 ENCOUNTER — Ambulatory Visit: Payer: BLUE CROSS/BLUE SHIELD | Attending: Orthopaedic Surgery

## 2016-08-01 ENCOUNTER — Encounter (INDEPENDENT_AMBULATORY_CARE_PROVIDER_SITE_OTHER): Payer: Self-pay | Admitting: Orthopaedic Surgery

## 2016-08-01 VITALS — BP 143/61 | HR 71 | Resp 18 | Ht 71.0 in | Wt 197.0 lb

## 2016-08-01 DIAGNOSIS — Z01818 Encounter for other preprocedural examination: Secondary | ICD-10-CM | POA: Insufficient documentation

## 2016-08-01 DIAGNOSIS — M1731 Unilateral post-traumatic osteoarthritis, right knee: Secondary | ICD-10-CM

## 2016-08-01 LAB — TYPE AND SCREEN
AB Screen Gel: NEGATIVE
ABO Rh: O POS

## 2016-08-01 MED ORDER — ENOXAPARIN SODIUM 40 MG/0.4ML SC SOLN
40.0000 mg | Freq: Every day | SUBCUTANEOUS | 0 refills | Status: AC
Start: 2016-08-10 — End: 2016-08-24

## 2016-08-01 NOTE — Progress Notes (Signed)
Chief Complaint: Right knee pain     HPI:  Kent Jordan is a 65 y.o.-year-old male here for here for their presurgical evaluation prior to Right total knee replacement scheduled for August 07, 2016. Pre-operative medical and dental clearances have been completed and he has had the total joint class.  He reports that his knee has become progressively more painful, and is very much looking forward to having the knee replaced.  He also notes that he has been quite diligent with dietary changes, eating healthier, cutting down on carbohydrates and losing weight, with much better control of his diabetes and approximately 20 pounds of weight loss.  He has been off of his Enbrel for over a month.    PMH:    Past Medical History:   Diagnosis Date   . Arrhythmia     when on ARB   . Arthritis     psoriatic and osteoarthritis    . Back pain    . Blood disorder 11/2014    Hemochromatosis   . Diabetes mellitus 2016    started metformin 05/2016 d/t A1C 7.1 - now checking BS 2x/day - average 70-110 in the last week - A1C on 06/29/16 = 6.3 , has also lost 16 lbs since 05/2016   . Difficulty in walking(719.7)     Uses cane to ambulate   . Elevated liver function tests     had abd u/s 07/05/16    . Failed back syndrome    . Fatty liver    . Hemochromatosis     phlebotomy every 3 months - last done 02/2016   . High cholesterol    . Hypertensive disorder 06/2016    norvasc recently increased to 10 mg daily - now controlled on meds - most recent BP 117/73   . Low back pain    . Lung nodule 2012    benign - was monitored x  3 yrs by Dr. Bernita Raisin [facility]) - signed off from pulmonology - no further f/u required since 2015   . Neuropathy     Tingling left foot, numbness left toes   . Psoriasis    . Psoriatic arthritis     tx w/ enbrel   . S/P insertion of spinal cord stimulator 2015   . Sleep apnea     mild - does not have CPAP - was unable to tolerate, less snoring since turboplasty       MEDS:    Current Outpatient Prescriptions   Medication  Sig Dispense Refill   . amLODIPine (NORVASC) 5 MG tablet Take 10 mg by mouth daily.         Marland Kitchen aspirin 81 MG chewable tablet Chew 81 mg by mouth daily.     . celecoxib (CELEBREX) 200 MG capsule Take 200 mg by mouth 2 (two) times daily.        . clobetasol (TEMOVATE) 0.05 % cream 1 APP 2 TIMES A DAY TO AFFECTED AREAS APPLIED TOPICALLY 30 DAYS  2   . cyclobenzaprine (FLEXERIL) 10 MG tablet Take 10 mg by mouth daily.        . diazepam (VALIUM) 5 MG tablet Take 5 mg by mouth daily.        . DULoxetine (CYMBALTA) 60 MG capsule Take 60 mg by mouth daily.     Elgie Collard SURECLICK 50 MG/ML Solution Auto-injector Inject as directed once a week.Thursdays         . folic acid (FOLVITE) 400 MCG tablet Take 400  mcg by mouth daily.     Marland Kitchen FREESTYLE LITE test strip      . hydroCHLOROthiazide (HYDRODIURIL) 25 MG tablet Take 25 mg by mouth daily.        . Lancets (FREESTYLE) lancets      . metFORMIN (GLUCOPHAGE) 500 MG tablet Take 500 mg by mouth daily.     . rosuvastatin (CRESTOR) 5 MG tablet Take 5 mg by mouth daily.     . traZODone (DESYREL) 50 MG tablet Take 50 mg by mouth nightly as needed.         Marland Kitchen ZETIA 10 MG tablet Take 10 mg by mouth daily.        [name] ON 08/10/2016] enoxaparin (LOVENOX) 40 MG/0.4ML Solution Inject 0.4 mLs (40 mg total) into the skin daily.for 14 days 5.6 mL 0       ROS:   All other systems were reviewed and are negative except as previously mentioned in the HPI.    EXAM:   BP 143/61   Pulse 71   Resp 18   Ht 1.803 m (5\' 11" )   Wt 89.4 kg (197 lb)   BMI 27.48 kg/m   Physical Exam   Constitutional: The patient is a 65 y.o. well-developed, well-nourished male in no acute distress.  Accompanied by his supportive wife.   Neurological: He is alert and oriented to person, place, and time. He has normal sensation and normal strength.  Head: Normocephalic and atraumatic.   Pulmonary/Chest: Effort normal. No respiratory distress.   Abdominal: He exhibits no distension.   Psychiatric: Mood and affect normal.    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 The risks include, but are not limited to: bleeding, transfusion, infection, fracture, stiffness, nerve or blood vessel injury, local incisional numbness, pain, blood clots, heart attack, heart arrythmia, stroke, death, and need for future surgery. The patient understands these risks and wishes to proceed. Pre-operative medical and dental clearances, and lab work were reviewed. The surgical consent was reviewed and signed. The importance of working very diligently with physical therapy after surgery to restore functional range of motion and strength of the knee was reiterated.Postop DVT prophylaxis with Lovenox was pre-ordered. He has no further questions at this time. The 1st post-op appointment is scheduled for August 22, 2016.

## 2016-08-06 NOTE — Discharge Instr - AVS First Page (Signed)
Reason for your Hospital Admission:    Right Total Knee Arthroplasty       Instructions for after your discharge:      Mixing alcohol and pain medications can cause dizziness and slow your breathing.    Don't drink alcoholic beverages while taking pain medications.

## 2016-08-07 ENCOUNTER — Inpatient Hospital Stay: Payer: BLUE CROSS/BLUE SHIELD | Admitting: Family

## 2016-08-07 ENCOUNTER — Encounter
Admission: RE | Disposition: A | Discharge status: Home / Self Care | Payer: Self-pay | Source: Ambulatory Visit | Attending: Orthopaedic Surgery

## 2016-08-07 ENCOUNTER — Inpatient Hospital Stay: Payer: BLUE CROSS/BLUE SHIELD | Admitting: Orthopaedic Surgery

## 2016-08-07 ENCOUNTER — Other Ambulatory Visit: Payer: Self-pay

## 2016-08-07 ENCOUNTER — Ambulatory Visit
Admission: RE | Admit: 2016-08-07 | Discharge: 2016-08-07 | Disposition: A | Discharge status: Home / Self Care | Payer: BLUE CROSS/BLUE SHIELD | Source: Ambulatory Visit | Attending: Orthopaedic Surgery | Admitting: Orthopaedic Surgery

## 2016-08-07 DIAGNOSIS — Z87891 Personal history of nicotine dependence: Secondary | ICD-10-CM | POA: Insufficient documentation

## 2016-08-07 DIAGNOSIS — M069 Rheumatoid arthritis, unspecified: Secondary | ICD-10-CM | POA: Insufficient documentation

## 2016-08-07 DIAGNOSIS — T1490XS Injury, unspecified, sequela: Secondary | ICD-10-CM | POA: Insufficient documentation

## 2016-08-07 DIAGNOSIS — Z538 Procedure and treatment not carried out for other reasons: Secondary | ICD-10-CM | POA: Insufficient documentation

## 2016-08-07 DIAGNOSIS — X58XXXS Exposure to other specified factors, sequela: Secondary | ICD-10-CM | POA: Insufficient documentation

## 2016-08-07 DIAGNOSIS — I10 Essential (primary) hypertension: Secondary | ICD-10-CM | POA: Insufficient documentation

## 2016-08-07 DIAGNOSIS — M1731 Unilateral post-traumatic osteoarthritis, right knee: Secondary | ICD-10-CM | POA: Insufficient documentation

## 2016-08-07 HISTORY — DX: Fatty (change of) liver, not elsewhere classified: K76.0

## 2016-08-07 LAB — GLUCOSE WHOLE BLOOD - POCT: Whole Blood Glucose POCT: 105 mg/dL — ABNORMAL HIGH (ref 70–100)

## 2016-08-07 SURGERY — ARTHROPLASTY, KNEE, TOTAL
Anesthesia: Anesthesia General | Laterality: Right

## 2016-08-07 MED ORDER — LIDOCAINE HCL 1 % IJ SOLN
INTRAMUSCULAR | Status: AC
Start: 2016-08-07 — End: ?
  Filled 2016-08-07: qty 10

## 2016-08-07 MED ORDER — FAMOTIDINE 20 MG/2ML IV SOLN
INTRAVENOUS | Status: AC
Start: 2016-08-07 — End: ?
  Filled 2016-08-07: qty 2

## 2016-08-07 MED ORDER — PREGABALIN 75 MG PO CAPS
ORAL_CAPSULE | ORAL | Status: AC
Start: 2016-08-07 — End: ?
  Filled 2016-08-07: qty 1

## 2016-08-07 MED ORDER — ROCURONIUM BROMIDE 50 MG/5ML IV SOLN
INTRAVENOUS | Status: AC
Start: 2016-08-07 — End: ?
  Filled 2016-08-07: qty 5

## 2016-08-07 MED ORDER — ROPIVACAINE HCL 5 MG/ML IJ SOLN
INTRAMUSCULAR | Status: AC
Start: 2016-08-07 — End: ?
  Filled 2016-08-07: qty 30

## 2016-08-07 MED ORDER — CELECOXIB 200 MG PO CAPS
200.0000 mg | ORAL_CAPSULE | Freq: Once | ORAL | Status: DC
Start: 2016-08-07 — End: 2016-08-07

## 2016-08-07 MED ORDER — TRANEXAMIC ACID 1000 MG/10ML IV SOLN
1000.0000 mg | Freq: Once | INTRAVENOUS | Status: DC
Start: 2016-08-07 — End: 2016-08-07
  Filled 2016-08-07: qty 10

## 2016-08-07 MED ORDER — OXYCODONE HCL ER 10 MG PO T12A
EXTENDED_RELEASE_TABLET | ORAL | Status: AC
Start: 2016-08-07 — End: ?
  Filled 2016-08-07: qty 1

## 2016-08-07 MED ORDER — LACTATED RINGERS IV SOLN
INTRAVENOUS | Status: DC
Start: 2016-08-07 — End: 2016-08-07

## 2016-08-07 MED ORDER — PROPOFOL 10 MG/ML IV EMUL (WRAP)
INTRAVENOUS | Status: AC
Start: 2016-08-07 — End: ?
  Filled 2016-08-07: qty 50

## 2016-08-07 MED ORDER — ACETAMINOPHEN 500 MG PO TABS
ORAL_TABLET | ORAL | Status: AC
Start: 2016-08-07 — End: ?
  Filled 2016-08-07: qty 2

## 2016-08-07 MED ORDER — SODIUM CHLORIDE 0.9 % IJ SOLN
INTRAMUSCULAR | Status: AC
Start: 2016-08-07 — End: ?
  Filled 2016-08-07: qty 10

## 2016-08-07 MED ORDER — OXYCODONE HCL ER 10 MG PO T12A
10.0000 mg | EXTENDED_RELEASE_TABLET | Freq: Once | ORAL | Status: AC
Start: 2016-08-07 — End: 2016-08-07
  Administered 2016-08-07: 10 mg via ORAL

## 2016-08-07 MED ORDER — PROPOFOL 10 MG/ML IV EMUL (WRAP)
INTRAVENOUS | Status: AC
Start: 2016-08-07 — End: ?
  Filled 2016-08-07: qty 20

## 2016-08-07 MED ORDER — CEFAZOLIN SODIUM-DEXTROSE 2-3 GM-% IV SOLR
2.0000 g | Freq: Once | INTRAVENOUS | Status: DC
Start: 2016-08-07 — End: 2016-08-07

## 2016-08-07 MED ORDER — CELECOXIB 200 MG PO CAPS
ORAL_CAPSULE | ORAL | Status: AC
Start: 2016-08-07 — End: ?
  Filled 2016-08-07: qty 1

## 2016-08-07 MED ORDER — PREGABALIN 75 MG PO CAPS
75.0000 mg | ORAL_CAPSULE | Freq: Once | ORAL | Status: DC
Start: 2016-08-07 — End: 2016-08-07

## 2016-08-07 MED ORDER — MIDAZOLAM HCL 2 MG/2ML IJ SOLN
INTRAMUSCULAR | Status: AC
Start: 2016-08-07 — End: ?
  Filled 2016-08-07: qty 2

## 2016-08-07 MED ORDER — GLYCOPYRROLATE 0.2 MG/ML IJ SOLN
INTRAMUSCULAR | Status: AC
Start: 2016-08-07 — End: ?
  Filled 2016-08-07: qty 1

## 2016-08-07 MED ORDER — ACETAMINOPHEN 500 MG PO TABS
1000.0000 mg | ORAL_TABLET | Freq: Once | ORAL | Status: AC
Start: 2016-08-07 — End: 2016-08-07
  Administered 2016-08-07: 1000 mg via ORAL

## 2016-08-07 MED ORDER — FENTANYL CITRATE (PF) 50 MCG/ML IJ SOLN (WRAP)
INTRAMUSCULAR | Status: AC
Start: 2016-08-07 — End: ?
  Filled 2016-08-07: qty 2

## 2016-08-07 MED ORDER — DESFLURANE IN SOLN
RESPIRATORY_TRACT | Status: AC
Start: 2016-08-07 — End: ?
  Filled 2016-08-07: qty 1

## 2016-08-07 MED FILL — Neomycin-Polymyxin B GU Irrigation Soln: Qty: 1 | Status: CN

## 2016-08-07 SURGICAL SUPPLY — 75 items
APPLCATOR CHLORAPREP 26ML (Prep) ×8 IMPLANT
BANDAGE ESMARK COMPRESSION L9 FT X W6 IN (Bandage) ×1
BANDAGE ESMARK COMPRESSION L9 FT X W6 IN SMOOTH FINISH ELASTIC (Bandage) ×1 IMPLANT
BANDAGE ESMARK STRL 6INX9YDS (Bandage) ×1
BANDAGE WEBRIL NSTRL 6IN (Bandage) ×1
BLADE SAW SAG 25X90X1.27X127MM (Blade) ×1
BLADE SAW SAG 29X58X0.64MM (Blade) ×1
BLADE SAW SAGITTAL HEAVY DUTY L91.5 MM X (Blade) ×1
BLADE SAW SAGITTAL HEAVY DUTY NA L91.5 MM X W25 MM X H1.27 MM (Blade) ×1 IMPLANT
BLADE SAW THK.64 MM SHORT NARROW THIN (Blade) ×1
BLADE SAW THK.64 MM SHORT NARROW THIN SAGITTAL NA L28.9 MM X W1.02 MM (Blade) ×1 IMPLANT
BNDG ACE ELASTIC 6IN STRL (Procedure Accessories) ×4 IMPLANT
CATHETER YANKAUER BULBOUS TIP (Suction) ×2 IMPLANT
CEMENT MIXER W/FEMERAL NOZZLE (Procedure Accessories) ×2 IMPLANT
CONTAINER SPECIMAN STERILE 5OZ (Procedure Accessories) ×2 IMPLANT
DRAIN HEMOVAC 100ML 1/8 TROCAR (Drain)
DRAPE INCISE IOBAN-2 90X45CM (Drape) ×1
DRAPE INCISE IOBAN-2 90X60CM (Drape) ×2 IMPLANT
DRAPE LWR EXT 114X88X126IN (Drape) ×2 IMPLANT
DRAPE STERI U-DRAPE 47X51IN (Drape) ×1
DRAPE SURGICAL 2 INCISE FILM (Drape) ×1 IMPLANT
DRAPE SURGICAL ADHESIVE L51 IN X W47 IN (Drape) ×1
DRAPE SURGICAL ADHESIVE L51 IN X W47 IN STERI-DRAPE CLEAR (Drape) ×1 IMPLANT
DRAPE SURGICAL FANFOLD L98 IN X W72 IN (Procedure Accessories) ×4
DRAPE SURGICAL FANFOLD L98 IN X W72 IN CONVERTORS TIBURON LARGE (Procedure Accessories) ×4 IMPLANT
DRESSING PETRO 3% BI 3BRM GZE XR 9X5IN (Dressing) ×1
DRESSING PETROLATUM XEROFORM L9 IN X W5 IN 3% BISMUTH TRIBROMOPHENATE (Dressing) ×1 IMPLANT
DRESSING XEROFORM 5X9IN (Dressing) ×1
GLOVE BIOGEL SURGCL INDICTR 8 (Glove) ×2
GLOVE SURG BIOGEL ORTHO SZ7.5 (Glove) ×2 IMPLANT
GLOVE SURGICAL 8 INDICATOR BIOGEL POWDER (Glove) ×2
GLOVE SURGICAL 8 INDICATOR BIOGEL POWDER FREE SMOOTH BEAD CUFF (Glove) ×2 IMPLANT
HANDLE LIGHT ADAPTIVE LIGHT CONTROL PLUS (Other) ×2
HANDLE LIGHT ADAPTIVE LIGHT CONTROL PLUS TECHNOLOGY SNAP ON LENS TOUCH (Other) ×2 IMPLANT
HANDLE TRUMPF LIGHT  DISP (Other) ×2
HNDPC INTRPLS SUCT (Other) ×2 IMPLANT
HOOD FLYTE PEELAWAY (Procedure Accessories) ×3
HOOD SURGEON ZIPPER PEEL AWAY LENS FLYTE (Procedure Accessories) ×3 IMPLANT
INSTRUMENT SCULPTING BONE CEM (Ortho Supply) ×1
KIT DRAINAGE L12.5 IN ROUND 3 SPRING (Drain) ×1
KIT DRAINAGE L12.5 IN ROUND 3 SPRING EVACUATOR Y CONNECTOR TUBE HOLE (Drain) ×1 IMPLANT
KIT DRAINAGE ROUND BULB EVACUATOR LOW (Drain)
KIT DRAINAGE ROUND BULB EVACUATOR LOW LEVEL SUCTION TROCAR OD1/8 IN (Drain) IMPLANT
KIT HEMOVAC 3SPRNG 1/8IN 400CC (Drain) ×1
KIT JOINT TOTAL IFH (Kits) ×2 IMPLANT
NEEDLE QUINCKE SPNL 22GA 3.5IN (Needles) ×1
NEEDLE SPINAL BD OD22 GA L3 1/2 IN (Needles) ×1
NEEDLE SPINAL L3 1/2 IN REGULAR WALL QUINCKE TIP OD22 GA BD (Needles) ×1 IMPLANT
PAD ELECTROSRG GRND REM W CRD (Procedure Accessories) ×2 IMPLANT
PADDING CAST L4 YD X W6 IN UNDERCAST (Bandage) ×1
PADDING CAST L4 YD X W6 IN UNDERCAST (Cast) ×1
PADDING CAST L4 YD X W6 IN UNDERCAST HAND TEARABLE SPECIALIST 100 (Bandage) ×1 IMPLANT
PADDING CAST L4 YD X W6 IN UNDERCAST MILD STRETCH COHESIVE WEBRIL (Cast) ×1 IMPLANT
PADNG CAST 6IN STRL (Cast) ×1
POUCH INST 18X30CM (Drape) ×2 IMPLANT
SEALER AQUAMANTYS 6.0 BIPOLAR (Cautery) ×1
SEALER ELECTROSURGICAL L.226 IN 30 D (Cautery) ×1
SEALER ELECTROSURGICAL L.226 IN 30 D .236 IN SPACE BIPOLAR 2 ELECTRODE (Cautery) ×1 IMPLANT
SHEET LARGE DRAPE 72X86IN (Procedure Accessories) ×4
SOL ALCOHOL ISOPROPYL 70% 4 OZ (Prep) ×2 IMPLANT
SOL NACL .9% IRRIG 500ML (Irrigation Solutions) ×1
SOLUTION IRRIGATION 0.9% SDM CHLORIDE 500ML PR BTTL ISOTONIC NONPRGNC (Irrigation Solutions) ×1 IMPLANT
SOLUTION IRRIGATION 0.9% SODIUM CHLORIDE (Irrigation Solutions) ×1
SPONGE GAUZE 16PLY STRL 4X4 (Sponge) ×1
SPONGE GAUZE L4 IN X W4 IN 16 PLY (Sponge) ×1
SPONGE GAUZE L4 IN X W4 IN 16 PLY MAXIMUM ABSORBENT TRAY CURITY (Sponge) ×1 IMPLANT
SPONGE LAP XRAY 18X18IN (Sponge) ×1
SPONGE LAPAROTOMY L18 IN X W18 IN (Sponge) ×1
SPONGE LAPAROTOMY L18 IN X W18 IN PREWASH WHITE (Sponge) ×1 IMPLANT
SYRINGE LEUR LOK TIP 30 ML (Syringes, Needles) ×2 IMPLANT
SYSTEM BONE CEMENT SCULP PREPARATION KIT (Ortho Supply) ×1
SYSTEM BONE CEMENT SCULP PREPARATION KIT CURETTE (Ortho Supply) ×1 IMPLANT
TOWEL STERILE REUSABLE 8PK (Procedure Accessories) ×6 IMPLANT
WAX BONE 2.5 G NATURAL (Hemostat) IMPLANT
WAX BONE 2.5GM (Hemostat)

## 2016-08-07 NOTE — Anesthesia Preprocedure Evaluation (Signed)
Anesthesia Evaluation    AIRWAY    Mallampati: II    TM distance: <3 FB  Neck ROM: full  Mouth Opening:full   CARDIOVASCULAR    regular and normal       DENTAL    no notable dental hx     PULMONARY    clear to auscultation     OTHER FINDINGS    Fused lumbar-sacral spine  Spinal cord stimulator in place, lumbar area  DM  OSA  Psoriatic arthritis          Relevant Problems   No active problems are marked relevant to this note.               Anesthesia Plan    ASA 3     general                     Detailed anesthesia plan: general endotracheal        Post op pain management: PNB catheter    informed consent obtained      pertinent labs reviewed             Signed by: Joselyn Arrow 08/07/16 7:06 AM

## 2016-08-07 NOTE — Progress Notes (Signed)
The patient's case is being cancelled unexpectedly due to problems with the processing of the equipment to perform the surgery.  The situation was explained to the patient and his wife.  We will discharge the patient to home, with plans to reschedule his case at the earliest convenient time.

## 2016-08-07 NOTE — Plan of Care (Signed)
Rapt at pre op class 05/21/16           Value   Score      What is your age group            50-65 =  2       2                    66-75 = 1              > 75=0    Gender            Male = 2        2        Male= 1         How far on average can you walk?    A block being 200 ft              Housebound more of time =  0      2    2 or more = 2  1 or 2 = 1  Housebound most of time = 1    What kind of gait aid do you use?       1    None = 2    Single point cane = 1    Crutches/walker =  0    Do you use community support.    Home help, meals on wheels,        District nursing          1   None or 1 per week = 1  Two or more/week  = 0       Will you live with someone     who can help care for you                       3     After surgery?     Yes = 3   No = 0        Total  11

## 2016-08-07 NOTE — OR PreOp (Signed)
Surgery cancelled per MD due to OR equipment. Dr. Josiah Lobo talked to pt.

## 2016-08-08 ENCOUNTER — Ambulatory Visit (INDEPENDENT_AMBULATORY_CARE_PROVIDER_SITE_OTHER): Payer: BLUE CROSS/BLUE SHIELD | Admitting: Orthopaedic Surgery

## 2016-08-15 NOTE — Pre-Procedure Instructions (Signed)
Talked with Jasmin and she okayed the T&S DOS

## 2016-08-20 ENCOUNTER — Inpatient Hospital Stay: Payer: BLUE CROSS/BLUE SHIELD | Admitting: Anesthesiology

## 2016-08-20 ENCOUNTER — Inpatient Hospital Stay: Payer: BLUE CROSS/BLUE SHIELD | Admitting: Orthopaedic Surgery

## 2016-08-20 ENCOUNTER — Encounter
Admission: RE | Disposition: A | Discharge status: Home Health | Payer: Self-pay | Source: Ambulatory Visit | Attending: Orthopaedic Surgery

## 2016-08-20 ENCOUNTER — Inpatient Hospital Stay: Payer: BLUE CROSS/BLUE SHIELD

## 2016-08-20 ENCOUNTER — Inpatient Hospital Stay
Admission: RE | Admit: 2016-08-20 | Discharge: 2016-08-22 | DRG: 470 | Disposition: A | Discharge status: Home Health | Payer: BLUE CROSS/BLUE SHIELD | Source: Ambulatory Visit | Attending: Orthopaedic Surgery | Admitting: Orthopaedic Surgery

## 2016-08-20 DIAGNOSIS — Z7984 Long term (current) use of oral hypoglycemic drugs: Secondary | ICD-10-CM

## 2016-08-20 DIAGNOSIS — G4733 Obstructive sleep apnea (adult) (pediatric): Secondary | ICD-10-CM | POA: Diagnosis present

## 2016-08-20 DIAGNOSIS — M1731 Unilateral post-traumatic osteoarthritis, right knee: Principal | ICD-10-CM | POA: Diagnosis present

## 2016-08-20 DIAGNOSIS — Z7982 Long term (current) use of aspirin: Secondary | ICD-10-CM

## 2016-08-20 DIAGNOSIS — Z7901 Long term (current) use of anticoagulants: Secondary | ICD-10-CM

## 2016-08-20 DIAGNOSIS — I10 Essential (primary) hypertension: Secondary | ICD-10-CM | POA: Diagnosis present

## 2016-08-20 DIAGNOSIS — L4050 Arthropathic psoriasis, unspecified: Secondary | ICD-10-CM | POA: Diagnosis present

## 2016-08-20 DIAGNOSIS — Z96651 Presence of right artificial knee joint: Secondary | ICD-10-CM

## 2016-08-20 DIAGNOSIS — Z981 Arthrodesis status: Secondary | ICD-10-CM

## 2016-08-20 DIAGNOSIS — M1711 Unilateral primary osteoarthritis, right knee: Secondary | ICD-10-CM

## 2016-08-20 DIAGNOSIS — E7800 Pure hypercholesterolemia, unspecified: Secondary | ICD-10-CM | POA: Diagnosis present

## 2016-08-20 DIAGNOSIS — E785 Hyperlipidemia, unspecified: Secondary | ICD-10-CM | POA: Diagnosis present

## 2016-08-20 DIAGNOSIS — E1142 Type 2 diabetes mellitus with diabetic polyneuropathy: Secondary | ICD-10-CM | POA: Diagnosis present

## 2016-08-20 HISTORY — PX: ARTHROPLASTY, KNEE, TOTAL: SHX3134

## 2016-08-20 LAB — TYPE AND SCREEN
AB Screen Gel: NEGATIVE
ABO Rh: O POS

## 2016-08-20 LAB — GLUCOSE WHOLE BLOOD - POCT
Whole Blood Glucose POCT: 121 mg/dL — ABNORMAL HIGH (ref 70–100)
Whole Blood Glucose POCT: 172 mg/dL — ABNORMAL HIGH (ref 70–100)
Whole Blood Glucose POCT: 181 mg/dL — ABNORMAL HIGH (ref 70–100)
Whole Blood Glucose POCT: 159 mg/dL — ABNORMAL HIGH (ref 70–100)

## 2016-08-20 SURGERY — ARTHROPLASTY, KNEE, TOTAL
Anesthesia: Anesthesia General | Site: Knee | Laterality: Right | Wound class: Clean

## 2016-08-20 MED ORDER — FENTANYL CITRATE (PF) 50 MCG/ML IJ SOLN (WRAP)
INTRAMUSCULAR | Status: AC
Start: 2016-08-20 — End: 2016-08-20
  Administered 2016-08-20: 25 ug via INTRAVENOUS
  Filled 2016-08-20: qty 2

## 2016-08-20 MED ORDER — ROCURONIUM BROMIDE 50 MG/5ML IV SOLN
INTRAVENOUS | Status: AC
Start: 2016-08-20 — End: ?
  Filled 2016-08-20: qty 5

## 2016-08-20 MED ORDER — NEOSTIGMINE METHYLSULFATE 1 MG/ML IJ/IV SOLN (WRAP)
Status: DC | PRN
Start: 2016-08-20 — End: 2016-08-20
  Administered 2016-08-20: 3 mg via INTRAVENOUS

## 2016-08-20 MED ORDER — CEFAZOLIN SODIUM-DEXTROSE 2-3 GM-% IV SOLR
2.0000 g | Freq: Three times a day (TID) | INTRAVENOUS | Status: AC
Start: 2016-08-20 — End: 2016-08-20
  Administered 2016-08-20 (×2): 2 g via INTRAVENOUS
  Filled 2016-08-20 (×2): qty 50

## 2016-08-20 MED ORDER — DEXTROSE 10 % IV BOLUS
125.0000 mL | INTRAVENOUS | Status: DC | PRN
Start: 2016-08-20 — End: 2016-08-22

## 2016-08-20 MED ORDER — MEPERIDINE HCL 25 MG/ML IJ SOLN
6.2500 mg | Freq: Once | INTRAMUSCULAR | Status: DC | PRN
Start: 2016-08-20 — End: 2016-08-20

## 2016-08-20 MED ORDER — DIAZEPAM 2 MG PO TABS
5.0000 mg | ORAL_TABLET | Freq: Every day | ORAL | Status: DC
Start: 2016-08-21 — End: 2016-08-22
  Administered 2016-08-21 – 2016-08-22 (×2): 5 mg via ORAL
  Filled 2016-08-20 (×2): qty 3

## 2016-08-20 MED ORDER — PROMETHAZINE HCL 25 MG PO TABS
25.0000 mg | ORAL_TABLET | Freq: Four times a day (QID) | ORAL | Status: DC | PRN
Start: 2016-08-20 — End: 2016-08-22

## 2016-08-20 MED ORDER — SODIUM CHLORIDE 0.9% BAG (IRRIGATION USE)
INTRAVENOUS | Status: DC | PRN
Start: 2016-08-20 — End: 2016-08-20
  Administered 2016-08-20: 1000 mL
  Administered 2016-08-20: 3000 mL

## 2016-08-20 MED ORDER — VITAMIN C 500 MG PO TABS
500.0000 mg | ORAL_TABLET | Freq: Every day | ORAL | Status: DC
Start: 2016-08-20 — End: 2016-08-22
  Administered 2016-08-20 – 2016-08-22 (×3): 500 mg via ORAL
  Filled 2016-08-20 (×3): qty 1

## 2016-08-20 MED ORDER — ACETAMINOPHEN 325 MG PO TABS
650.0000 mg | ORAL_TABLET | Freq: Four times a day (QID) | ORAL | Status: DC
Start: 2016-08-20 — End: 2016-08-22
  Administered 2016-08-20 – 2016-08-22 (×8): 650 mg via ORAL
  Filled 2016-08-20 (×8): qty 2

## 2016-08-20 MED ORDER — PREGABALIN 75 MG PO CAPS
75.0000 mg | ORAL_CAPSULE | Freq: Once | ORAL | Status: DC
Start: 2016-08-20 — End: 2016-08-20

## 2016-08-20 MED ORDER — ROPIVACAINE HCL 5 MG/ML IJ SOLN
INTRAMUSCULAR | Status: DC | PRN
Start: 2016-08-20 — End: 2016-08-20
  Administered 2016-08-20: 20 mL

## 2016-08-20 MED ORDER — PROPOFOL 10 MG/ML IV EMUL (WRAP)
INTRAVENOUS | Status: DC | PRN
Start: 2016-08-20 — End: 2016-08-20
  Administered 2016-08-20: 50 mg via INTRAVENOUS
  Administered 2016-08-20: 20 mg via INTRAVENOUS
  Administered 2016-08-20: 160 mg via INTRAVENOUS
  Administered 2016-08-20: 20 mg via INTRAVENOUS

## 2016-08-20 MED ORDER — FAMOTIDINE 20 MG/2ML IV SOLN
INTRAVENOUS | Status: AC
Start: 2016-08-20 — End: ?
  Filled 2016-08-20: qty 2

## 2016-08-20 MED ORDER — ONDANSETRON 4 MG PO TBDP
4.0000 mg | ORAL_TABLET | Freq: Three times a day (TID) | ORAL | Status: DC | PRN
Start: 2016-08-20 — End: 2016-08-22

## 2016-08-20 MED ORDER — OXYCODONE HCL ER 10 MG PO T12A
10.0000 mg | EXTENDED_RELEASE_TABLET | Freq: Two times a day (BID) | ORAL | Status: DC
Start: 2016-08-20 — End: 2016-08-22
  Administered 2016-08-20 – 2016-08-22 (×4): 10 mg via ORAL
  Filled 2016-08-20 (×4): qty 1

## 2016-08-20 MED ORDER — CLOBETASOL PROPIONATE 0.05 % EX CREA
TOPICAL_CREAM | Freq: Two times a day (BID) | CUTANEOUS | Status: DC
Start: 2016-08-20 — End: 2016-08-22
  Filled 2016-08-20: qty 15

## 2016-08-20 MED ORDER — LACTATED RINGERS IV SOLN
100.0000 mL/h | INTRAVENOUS | Status: DC
Start: 2016-08-20 — End: 2016-08-20
  Administered 2016-08-20: 100 mL/h via INTRAVENOUS

## 2016-08-20 MED ORDER — PROPOFOL 10 MG/ML IV EMUL (WRAP)
INTRAVENOUS | Status: AC
Start: 2016-08-20 — End: ?
  Filled 2016-08-20: qty 20

## 2016-08-20 MED ORDER — OXYCODONE HCL 5 MG PO TABS
5.0000 mg | ORAL_TABLET | ORAL | Status: DC | PRN
Start: 2016-08-20 — End: 2016-08-22

## 2016-08-20 MED ORDER — HYDROMORPHONE HCL 1 MG/ML IJ SOLN
0.5000 mg | INTRAMUSCULAR | Status: DC | PRN
Start: 2016-08-20 — End: 2016-08-22

## 2016-08-20 MED ORDER — ONDANSETRON HCL 4 MG/2ML IJ SOLN
INTRAMUSCULAR | Status: AC
Start: 2016-08-20 — End: ?
  Filled 2016-08-20: qty 2

## 2016-08-20 MED ORDER — GLYCOPYRROLATE 0.2 MG/ML IJ SOLN
INTRAMUSCULAR | Status: DC | PRN
Start: 2016-08-20 — End: 2016-08-20
  Administered 2016-08-20: 0.4 mg via INTRAVENOUS

## 2016-08-20 MED ORDER — MIDAZOLAM HCL 2 MG/2ML IJ SOLN
INTRAMUSCULAR | Status: AC
Start: 2016-08-20 — End: ?
  Filled 2016-08-20: qty 2

## 2016-08-20 MED ORDER — ACETAMINOPHEN 500 MG PO TABS
1000.0000 mg | ORAL_TABLET | Freq: Once | ORAL | Status: AC
Start: 2016-08-20 — End: 2016-08-20
  Administered 2016-08-20: 1000 mg via ORAL

## 2016-08-20 MED ORDER — CELECOXIB 200 MG PO CAPS
200.0000 mg | ORAL_CAPSULE | Freq: Once | ORAL | Status: DC
Start: 2016-08-20 — End: 2016-08-20

## 2016-08-20 MED ORDER — METFORMIN HCL 500 MG PO TABS
500.0000 mg | ORAL_TABLET | Freq: Every day | ORAL | Status: DC
Start: 2016-08-20 — End: 2016-08-22
  Administered 2016-08-21 – 2016-08-22 (×2): 500 mg via ORAL
  Filled 2016-08-20 (×2): qty 1

## 2016-08-20 MED ORDER — NEOSTIGMINE METHYLSULFATE 1 MG/ML IJ SOLN
INTRAMUSCULAR | Status: AC
Start: 2016-08-20 — End: ?
  Filled 2016-08-20: qty 5

## 2016-08-20 MED ORDER — INSULIN REGULAR HUMAN 100 UNIT/ML IJ SOLN
1.0000 [IU] | Freq: Three times a day (TID) | INTRAMUSCULAR | Status: DC | PRN
Start: 2016-08-20 — End: 2016-08-22

## 2016-08-20 MED ORDER — OXYCODONE HCL ER 10 MG PO T12A
EXTENDED_RELEASE_TABLET | ORAL | Status: AC
Start: 2016-08-20 — End: ?
  Filled 2016-08-20: qty 1

## 2016-08-20 MED ORDER — DIPHENHYDRAMINE HCL 50 MG/ML IJ SOLN
12.5000 mg | Freq: Once | INTRAMUSCULAR | Status: DC | PRN
Start: 2016-08-20 — End: 2016-08-20

## 2016-08-20 MED ORDER — BACITRACIN ZINC 500 UNIT/GM EX OINT
TOPICAL_OINTMENT | CUTANEOUS | Status: DC | PRN
Start: 2016-08-20 — End: 2016-08-20
  Administered 2016-08-20: 1 via TOPICAL

## 2016-08-20 MED ORDER — PHENYLEPHRINE HCL 10 MG/ML IV SOLN (WRAP)
Status: DC | PRN
Start: 2016-08-20 — End: 2016-08-20
  Administered 2016-08-20 (×3): 50 ug via INTRAVENOUS

## 2016-08-20 MED ORDER — LACTATED RINGERS IV SOLN
INTRAVENOUS | Status: DC | PRN
Start: 2016-08-20 — End: 2016-08-20

## 2016-08-20 MED ORDER — ROPIVACAINE HCL 5 MG/ML IJ SOLN
INTRAMUSCULAR | Status: AC
Start: 2016-08-20 — End: ?
  Filled 2016-08-20: qty 30

## 2016-08-20 MED ORDER — PREGABALIN 75 MG PO CAPS
ORAL_CAPSULE | ORAL | Status: AC
Start: 2016-08-20 — End: ?
  Filled 2016-08-20: qty 1

## 2016-08-20 MED ORDER — ONDANSETRON HCL 4 MG/2ML IJ SOLN
INTRAMUSCULAR | Status: DC | PRN
Start: 2016-08-20 — End: 2016-08-20

## 2016-08-20 MED ORDER — FOLIC ACID 400 MCG PO TABS
400.0000 ug | ORAL_TABLET | Freq: Every day | ORAL | Status: DC
Start: 2016-08-20 — End: 2016-08-20

## 2016-08-20 MED ORDER — CEFAZOLIN SODIUM-DEXTROSE 2-3 GM-% IV SOLR
INTRAVENOUS | Status: AC
Start: 2016-08-20 — End: ?
  Filled 2016-08-20: qty 50

## 2016-08-20 MED ORDER — ENOXAPARIN SODIUM 30 MG/0.3ML SC SOLN
30.0000 mg | Freq: Two times a day (BID) | SUBCUTANEOUS | Status: DC
Start: 2016-08-21 — End: 2016-08-22
  Administered 2016-08-21 – 2016-08-22 (×3): 30 mg via SUBCUTANEOUS
  Filled 2016-08-20 (×3): qty 0.3

## 2016-08-20 MED ORDER — GLYCOPYRROLATE 0.2 MG/ML IJ SOLN
INTRAMUSCULAR | Status: AC
Start: 2016-08-20 — End: ?
  Filled 2016-08-20: qty 1

## 2016-08-20 MED ORDER — SODIUM CHLORIDE 0.9 % IV SOLN
INTRAVENOUS | Status: DC
Start: 2016-08-20 — End: 2016-08-21

## 2016-08-20 MED ORDER — ONDANSETRON HCL 4 MG/2ML IJ SOLN
4.0000 mg | Freq: Once | INTRAMUSCULAR | Status: DC | PRN
Start: 2016-08-20 — End: 2016-08-20

## 2016-08-20 MED ORDER — ON-Q PUMP SINGLE FLOW
Status: DC
Start: 2016-08-20 — End: 2016-08-22
  Filled 2016-08-20: qty 550

## 2016-08-20 MED ORDER — MAGNESIUM HYDROXIDE 400 MG/5ML PO SUSP
10.0000 mL | Freq: Every day | ORAL | Status: DC | PRN
Start: 2016-08-20 — End: 2016-08-22

## 2016-08-20 MED ORDER — FAMOTIDINE 10 MG/ML IV SOLN (WRAP)
INTRAVENOUS | Status: DC | PRN
Start: 2016-08-20 — End: 2016-08-20
  Administered 2016-08-20: 20 mg via INTRAVENOUS

## 2016-08-20 MED ORDER — DEXAMETHASONE SODIUM PHOSPHATE 4 MG/ML IJ SOLN (WRAP)
INTRAMUSCULAR | Status: DC | PRN
Start: 2016-08-20 — End: 2016-08-20
  Administered 2016-08-20: 4 mg via INTRAVENOUS

## 2016-08-20 MED ORDER — NALOXONE HCL 0.4 MG/ML IJ SOLN (WRAP)
0.4000 mg | INTRAMUSCULAR | Status: DC | PRN
Start: 2016-08-20 — End: 2016-08-22

## 2016-08-20 MED ORDER — PROMETHAZINE HCL 25 MG/ML IJ SOLN
6.2500 mg | Freq: Once | INTRAMUSCULAR | Status: DC | PRN
Start: 2016-08-20 — End: 2016-08-20

## 2016-08-20 MED ORDER — CELECOXIB 200 MG PO CAPS
200.0000 mg | ORAL_CAPSULE | Freq: Every day | ORAL | Status: DC
Start: 2016-08-21 — End: 2016-08-22
  Administered 2016-08-21 – 2016-08-22 (×2): 200 mg via ORAL
  Filled 2016-08-20 (×2): qty 1

## 2016-08-20 MED ORDER — CEFAZOLIN SODIUM-DEXTROSE 2-3 GM-% IV SOLR
2.0000 g | Freq: Once | INTRAVENOUS | Status: AC
Start: 2016-08-20 — End: 2016-08-20
  Administered 2016-08-20: 2 g via INTRAVENOUS

## 2016-08-20 MED ORDER — HYDROMORPHONE HCL 1 MG/ML IJ SOLN
INTRAMUSCULAR | Status: AC
Start: 2016-08-20 — End: 2016-08-20
  Administered 2016-08-20: 0.5 mg via INTRAVENOUS
  Filled 2016-08-20: qty 1

## 2016-08-20 MED ORDER — EZETIMIBE 10 MG PO TABS
10.0000 mg | ORAL_TABLET | Freq: Every day | ORAL | Status: DC
Start: 2016-08-21 — End: 2016-08-22
  Administered 2016-08-21 – 2016-08-22 (×2): 10 mg via ORAL
  Filled 2016-08-20 (×2): qty 1

## 2016-08-20 MED ORDER — OXYCODONE HCL ER 10 MG PO T12A
10.0000 mg | EXTENDED_RELEASE_TABLET | Freq: Once | ORAL | Status: AC
Start: 2016-08-20 — End: 2016-08-20
  Administered 2016-08-20: 10 mg via ORAL

## 2016-08-20 MED ORDER — AMLODIPINE BESYLATE 5 MG PO TABS
10.0000 mg | ORAL_TABLET | Freq: Every day | ORAL | Status: DC
Start: 2016-08-21 — End: 2016-08-22
  Administered 2016-08-21 – 2016-08-22 (×2): 10 mg via ORAL
  Filled 2016-08-20 (×2): qty 2

## 2016-08-20 MED ORDER — HYDROMORPHONE HCL 1 MG/ML IJ SOLN
0.5000 mg | INTRAMUSCULAR | Status: AC | PRN
Start: 2016-08-20 — End: 2016-08-20
  Administered 2016-08-20 (×2): 0.5 mg via INTRAVENOUS

## 2016-08-20 MED ORDER — TRAZODONE HCL 50 MG PO TABS
50.0000 mg | ORAL_TABLET | Freq: Every evening | ORAL | Status: DC | PRN
Start: 2016-08-20 — End: 2016-08-22

## 2016-08-20 MED ORDER — ROSUVASTATIN CALCIUM 5 MG PO TABS
5.0000 mg | ORAL_TABLET | Freq: Every day | ORAL | Status: DC
Start: 2016-08-21 — End: 2016-08-22
  Administered 2016-08-21 – 2016-08-22 (×2): 5 mg via ORAL
  Filled 2016-08-20 (×2): qty 1

## 2016-08-20 MED ORDER — GLUCOSE 40 % PO GEL
15.0000 g | ORAL | Status: DC | PRN
Start: 2016-08-20 — End: 2016-08-22

## 2016-08-20 MED ORDER — HYDROCHLOROTHIAZIDE 25 MG PO TABS
25.0000 mg | ORAL_TABLET | Freq: Every day | ORAL | Status: DC
Start: 2016-08-21 — End: 2016-08-22
  Administered 2016-08-21 – 2016-08-22 (×2): 25 mg via ORAL
  Filled 2016-08-20 (×2): qty 1

## 2016-08-20 MED ORDER — GLYCOPYRROLATE 0.2 MG/ML IJ SOLN
INTRAMUSCULAR | Status: AC
Start: 2016-08-20 — End: ?
  Filled 2016-08-20: qty 2

## 2016-08-20 MED ORDER — ACETAMINOPHEN 500 MG PO TABS
ORAL_TABLET | ORAL | Status: AC
Start: 2016-08-20 — End: ?
  Filled 2016-08-20: qty 2

## 2016-08-20 MED ORDER — MIDAZOLAM HCL 2 MG/2ML IJ SOLN
INTRAMUSCULAR | Status: DC | PRN
Start: 2016-08-20 — End: 2016-08-20
  Administered 2016-08-20: 2 mg via INTRAVENOUS

## 2016-08-20 MED ORDER — SODIUM CHLORIDE BACTERIOSTATIC 0.9 % IJ SOLN
INTRAMUSCULAR | Status: DC | PRN
Start: 2016-08-20 — End: 2016-08-20
  Administered 2016-08-20: 7 mL

## 2016-08-20 MED ORDER — INSULIN REGULAR HUMAN 100 UNIT/ML IJ SOLN
1.0000 [IU] | Freq: Every evening | INTRAMUSCULAR | Status: DC | PRN
Start: 2016-08-20 — End: 2016-08-22
  Administered 2016-08-20: 1 [IU] via SUBCUTANEOUS
  Filled 2016-08-20: qty 3

## 2016-08-20 MED ORDER — HYDROMORPHONE HCL 2 MG PO TABS
2.0000 mg | ORAL_TABLET | Freq: Once | ORAL | Status: AC | PRN
Start: 2016-08-20 — End: 2016-08-20

## 2016-08-20 MED ORDER — ONDANSETRON HCL 4 MG/2ML IJ SOLN
4.0000 mg | Freq: Three times a day (TID) | INTRAMUSCULAR | Status: DC | PRN
Start: 2016-08-20 — End: 2016-08-22

## 2016-08-20 MED ORDER — FENTANYL CITRATE (PF) 50 MCG/ML IJ SOLN (WRAP)
INTRAMUSCULAR | Status: DC | PRN
Start: 2016-08-20 — End: 2016-08-20
  Administered 2016-08-20 (×3): 25 ug via INTRAVENOUS
  Administered 2016-08-20: 50 ug via INTRAVENOUS
  Administered 2016-08-20 (×6): 25 ug via INTRAVENOUS
  Administered 2016-08-20: 50 ug via INTRAVENOUS

## 2016-08-20 MED ORDER — LIDOCAINE HCL 2 % IJ SOLN
INTRAMUSCULAR | Status: DC | PRN
Start: 2016-08-20 — End: 2016-08-20
  Administered 2016-08-20: 100 mg

## 2016-08-20 MED ORDER — OXYCODONE HCL 5 MG PO TABS
10.0000 mg | ORAL_TABLET | ORAL | Status: DC | PRN
Start: 2016-08-20 — End: 2016-08-22
  Administered 2016-08-20 (×2): 10 mg via ORAL
  Filled 2016-08-20: qty 2

## 2016-08-20 MED ORDER — FENTANYL CITRATE (PF) 50 MCG/ML IJ SOLN (WRAP)
25.0000 ug | INTRAMUSCULAR | Status: AC | PRN
Start: 2016-08-20 — End: 2016-08-20
  Administered 2016-08-20 (×3): 25 ug via INTRAVENOUS

## 2016-08-20 MED ORDER — KETOROLAC TROMETHAMINE 30 MG/ML IJ SOLN
15.0000 mg | Freq: Once | INTRAMUSCULAR | Status: AC
Start: 2016-08-20 — End: 2016-08-20

## 2016-08-20 MED ORDER — SODIUM CHLORIDE 0.9 % IV SOLN
1000.0000 mg | Freq: Once | INTRAVENOUS | Status: AC
Start: 2016-08-20 — End: 2016-08-20
  Administered 2016-08-20: 1000 mg via INTRAVENOUS
  Filled 2016-08-20: qty 10

## 2016-08-20 MED ORDER — LUBRIFRESH P.M. OP OINT
TOPICAL_OINTMENT | OPHTHALMIC | Status: AC
Start: 2016-08-20 — End: ?
  Filled 2016-08-20: qty 3.5

## 2016-08-20 MED ORDER — PHENYLEPHRINE 100 MCG/ML IN NACL 0.9% IV SOSY
PREFILLED_SYRINGE | INTRAVENOUS | Status: AC
Start: 2016-08-20 — End: ?
  Filled 2016-08-20: qty 5

## 2016-08-20 MED ORDER — ROPIVACAINE HCL 5 MG/ML IJ SOLN
INTRAMUSCULAR | Status: DC | PRN
Start: 2016-08-20 — End: 2016-08-20
  Administered 2016-08-20: 6 mL via PERINEURAL

## 2016-08-20 MED ORDER — KETOROLAC TROMETHAMINE 30 MG/ML IJ SOLN
INTRAMUSCULAR | Status: AC
Start: 2016-08-20 — End: 2016-08-20
  Administered 2016-08-20: 15 mg via INTRAVENOUS
  Filled 2016-08-20: qty 1

## 2016-08-20 MED ORDER — DULOXETINE HCL 60 MG PO CPEP
60.0000 mg | ORAL_CAPSULE | Freq: Every day | ORAL | Status: DC
Start: 2016-08-21 — End: 2016-08-22
  Administered 2016-08-21 – 2016-08-22 (×2): 60 mg via ORAL
  Filled 2016-08-20 (×2): qty 1

## 2016-08-20 MED ORDER — PROMETHAZINE HCL 25 MG RE SUPP
25.0000 mg | Freq: Four times a day (QID) | RECTAL | Status: DC | PRN
Start: 2016-08-20 — End: 2016-08-22

## 2016-08-20 MED ORDER — CELECOXIB 200 MG PO CAPS
ORAL_CAPSULE | ORAL | Status: AC
Start: 2016-08-20 — End: ?
  Filled 2016-08-20: qty 1

## 2016-08-20 MED ORDER — ROCURONIUM BROMIDE 10 MG/ML IV SOLN (WRAP)
INTRAVENOUS | Status: DC | PRN
Start: 2016-08-20 — End: 2016-08-20
  Administered 2016-08-20: 50 mg via INTRAVENOUS

## 2016-08-20 MED ORDER — PROMETHAZINE HCL 25 MG/ML IJ SOLN
6.2500 mg | Freq: Four times a day (QID) | INTRAMUSCULAR | Status: DC | PRN
Start: 2016-08-20 — End: 2016-08-22

## 2016-08-20 MED ORDER — LIDOCAINE HCL (PF) 2 % IJ SOLN
INTRAMUSCULAR | Status: AC
Start: 2016-08-20 — End: ?
  Filled 2016-08-20: qty 5

## 2016-08-20 MED ORDER — DOCUSATE SODIUM 100 MG PO CAPS
100.0000 mg | ORAL_CAPSULE | Freq: Two times a day (BID) | ORAL | Status: DC
Start: 2016-08-20 — End: 2016-08-22
  Administered 2016-08-20 – 2016-08-22 (×4): 100 mg via ORAL
  Filled 2016-08-20 (×4): qty 1

## 2016-08-20 MED ORDER — CYCLOBENZAPRINE HCL 10 MG PO TABS
10.0000 mg | ORAL_TABLET | Freq: Every day | ORAL | Status: DC
Start: 2016-08-20 — End: 2016-08-22
  Administered 2016-08-20 – 2016-08-22 (×3): 10 mg via ORAL
  Filled 2016-08-20 (×3): qty 1

## 2016-08-20 MED ORDER — HYDROMORPHONE HCL 2 MG PO TABS
ORAL_TABLET | ORAL | Status: AC
Start: 2016-08-20 — End: 2016-08-20
  Administered 2016-08-20: 2 mg via ORAL
  Filled 2016-08-20: qty 1

## 2016-08-20 MED ORDER — ONDANSETRON HCL 4 MG/2ML IJ SOLN
INTRAMUSCULAR | Status: DC | PRN
Start: 2016-08-20 — End: 2016-08-20
  Administered 2016-08-20: 4 mg via INTRAVENOUS

## 2016-08-20 MED ORDER — OXYCODONE HCL 5 MG PO TABS
15.0000 mg | ORAL_TABLET | ORAL | Status: DC | PRN
Start: 2016-08-20 — End: 2016-08-22
  Administered 2016-08-21 – 2016-08-22 (×6): 15 mg via ORAL
  Filled 2016-08-20 (×6): qty 3

## 2016-08-20 MED ORDER — GLUCAGON 1 MG IJ SOLR (WRAP)
1.0000 mg | INTRAMUSCULAR | Status: DC | PRN
Start: 2016-08-20 — End: 2016-08-22

## 2016-08-20 MED FILL — Neomycin-Polymyxin B GU Irrigation Soln: Qty: 1 | Status: AC

## 2016-08-20 SURGICAL SUPPLY — 94 items
APPLCATOR CHLORAPREP 26ML (Prep) ×8 IMPLANT
BANDAGE CMPR PLSTR CTTN MED MTRX 5YDX6IN (Bandage) ×1
BANDAGE ELASTIC L5 YD X W6 IN W/SELF-CLOSURE HOOK (Bandage) ×1 IMPLANT
BANDAGE ESMARK COMPRESSION L9 FT X W6 IN (Bandage) ×1
BANDAGE ESMARK COMPRESSION L9 FT X W6 IN SMOOTH FINISH ELASTIC (Bandage) ×1 IMPLANT
BANDAGE ESMARK STRL 6INX9YDS (Bandage) ×1
BANDAGE MEDLINE MEDIUM COMPRESSION L5 YD (Bandage) ×1
BEARING TIB E1 VNGRD 83MMX14MM ANT STAB (Bearing) ×1 IMPLANT
BEARING TIBIAL L83 MM X H14 MM KNEE (Bearing) ×1 IMPLANT
BEARING TIBIAL L83 MM X H14 MM KNEE VANGUARD E1 ANTERIOR STABILIZE (Bearing) ×1 IMPLANT
BLADE SAW SAGITTAL HEAVY DUTY L91.5 MM X (Blade) ×1
BLADE SAW SAGITTAL HEAVY DUTY NA L91.5 MM X W25 MM X H1.27 MM (Blade) ×1 IMPLANT
BLADE SAW THK.64 MM SHORT NARROW THIN (Blade) ×1
BLADE SAW THK.64 MM SHORT NARROW THIN SAGITTAL NA L28.9 MM X W1.02 MM (Blade) ×1 IMPLANT
BLADE SW 91.5X25X1.27MM STRL SGTL HVDTY (Blade) ×1
BLADE SW SS THK.64MM SHRT NAR THN 28.9 (Blade) ×1
BNDG ACE ELASTIC 6IN STRL (Procedure Accessories) ×4 IMPLANT
CATHETER YANKAUER BULBOUS TIP (Suction) ×2 IMPLANT
CEMENT MIXER W/FEMERAL NOZZLE (Procedure Accessories) ×2 IMPLANT
CEMENT PALACOS R  G GENATMICIN (Cement) ×2 IMPLANT
COMPONENT FEM TI COCR POR VNGRD 67.5MM (Component) ×1 IMPLANT
COMPONENT FEMORAL L67.5 MM KNEE RIGHT CRUCIATE RETAIN CEMENT PRIMARY (Component) ×1 IMPLANT
COMPONENT FEMORAL L67.5 MM KNEE RT (Component) ×1 IMPLANT
COMPONENT PATELLAR H8.5 MM OD34 MM 3 PEG (Patella) ×1 IMPLANT
COMPONENT PATELLAR H8.5 MM OD34 MM 3 PEG WIRE ASYMMETRIC VANGUARD KNEE (Patella) ×1 IMPLANT
COMPONENT PTLR VNGRD 34MM 8.5MM STRL 3 (Patella) ×1 IMPLANT
CONTAINER SPECIMAN STERILE 5OZ (Procedure Accessories) ×2 IMPLANT
DRAPE INCISE IOBAN-2 90X60CM (Drape) ×2 IMPLANT
DRAPE LWR EXT 114X88X126IN (Drape) ×2 IMPLANT
DRAPE SRG TBRN LG CNVRT 98X72IN LF STRL (Procedure Accessories) ×4
DRAPE SURGICAL FANFOLD L98 IN X W72 IN (Procedure Accessories) ×4
DRAPE SURGICAL FANFOLD L98 IN X W72 IN CONVERTORS TIBURON LARGE (Procedure Accessories) ×4 IMPLANT
DRESSING PETRO 3% BI 3BRM GZE XR 8X1IN (Dressing) ×1
DRESSING PETRO 3% BI 3BRM GZE XR 9X5IN (Dressing) ×2
DRESSING PETROLATUM XEROFORM L8 IN X W1 (Dressing) ×1
DRESSING PETROLATUM XEROFORM L8 IN X W1 IN 3% BISMUTH TRIBROMOPHENATE (Dressing) ×1 IMPLANT
DRESSING PETROLATUM XEROFORM L9 IN X W5 IN 3% BISMUTH TRIBROMOPHENATE (Dressing) ×1 IMPLANT
GLOVE SRG NTR RBR 8 INDCTR BGL 299X103MM (Glove) ×2
GLOVE SURG BIOGEL ORTHO SZ7.5 (Glove) ×2 IMPLANT
GLOVE SURGICAL 8 INDICATOR BIOGEL POWDER (Glove) ×2
GLOVE SURGICAL 8 INDICATOR BIOGEL POWDER FREE SMOOTH BEAD CUFF (Glove) ×2 IMPLANT
HANDLE LIGHT ADAPTIVE LIGHT CONTROL PLUS (Other) ×2
HANDLE LIGHT ADAPTIVE LIGHT CONTROL PLUS TECHNOLOGY SNAP ON LENS TOUCH (Other) ×2 IMPLANT
HNDL LGHT ADP LGHT CNTRL + TCH SNPON LEN (Other) ×2
HNDPC INTRPLS SUCT (Other) ×2 IMPLANT
HOOD SRG FLYTE STRL PLWY LEN LVL 1 DISP (Procedure Accessories) ×3
HOOD SURGEON ZIPPER PEEL AWAY LENS FLYTE (Procedure Accessories) ×3 IMPLANT
KIT DRAINAGE L12.5 IN ROUND 3 SPRING (Drain)
KIT DRAINAGE L12.5 IN ROUND 3 SPRING EVACUATOR Y CONNECTOR TUBE HOLE (Drain) IMPLANT
KIT DRAINAGE ROUND BULB EVACUATOR LOW (Drain)
KIT DRAINAGE ROUND BULB EVACUATOR LOW LEVEL SUCTION TROCAR OD1/8 IN (Drain) IMPLANT
KIT DRN PVC 400CC RND 1/8IN 12.5IN LF (Drain)
KIT DRN SIL 100CC RND 1/8IN LF STRL BLB (Drain)
KIT JOINT TOTAL IFH (Kits) ×2 IMPLANT
NEEDLE SPINAL BD OD22 GA L3 1/2 IN (Needles) ×1
NEEDLE SPINAL L3 1/2 IN REGULAR WALL QUINCKE TIP OD22 GA BD (Needles) ×1 IMPLANT
NEEDLE SPNL PP RW BD QNCK 22GA 3.5IN LF (Needles) ×1
PAD ABD DERMACEA 9X5IN LF STRL 4 SL EDG (Dressing) ×2
PAD ABDOMINAL L9 IN X W5 IN 4 SEAL EDGE (Dressing) ×2 IMPLANT
PAD ELECTROSRG GRND REM W CRD (Procedure Accessories) ×2 IMPLANT
PADDING CAST L4 YD X W6 IN UNDERCAST (Bandage) ×1
PADDING CAST L4 YD X W6 IN UNDERCAST (Cast) ×1
PADDING CAST L4 YD X W6 IN UNDERCAST HAND TEARABLE SPECIALIST 100 (Bandage) ×1 IMPLANT
PADDING CAST L4 YD X W6 IN UNDERCAST MILD STRETCH COHESIVE WEBRIL (Cast) ×1 IMPLANT
PADDING CST CTTN SPCLST 100 4YDX6IN LF (Bandage) ×1
PADDING CST CTTN WBRL 4YDX6IN LF STRL (Cast) ×1
POUCH INST 18X30CM (Drape) ×2 IMPLANT
SEALER ELECTROSURGICAL L.226 IN 30 D (Cautery) ×1
SEALER ELECTROSURGICAL L.226 IN 30 D .236 IN SPACE BIPOLAR 2 ELECTRODE (Cautery) ×1 IMPLANT
SEALER ESURG 30D .236IN SPC AQUAMANTYS 6 (Cautery) ×1
SOL ALCOHOL ISOPROPYL 70% 4 OZ (Prep) ×2 IMPLANT
SOL IRR 0.9% NACL 500ML PLS PR BTL ISTNC (Irrigation Solutions) ×1
SOLUTION IRRIGATION 0.9% SDM CHLORIDE 500ML PR BTTL ISOTONIC NONPRGNC (Irrigation Solutions) ×1 IMPLANT
SOLUTION IRRIGATION 0.9% SODIUM CHLORIDE (Irrigation Solutions) ×1
SPONGE GAUZE L4 IN X W4 IN 16 PLY (Dressing) ×1
SPONGE GAUZE L4 IN X W4 IN 16 PLY (Sponge) ×1
SPONGE GAUZE L4 IN X W4 IN 16 PLY MAXIMUM ABSORBENT TRAY CURITY (Sponge) ×1 IMPLANT
SPONGE GAUZE L4 IN X W4 IN 16 PLY MAXIMUM ABSORBENT USP TYPE VII (Dressing) ×1 IMPLANT
SPONGE GZE CTTN CRTY 4X4IN LF NS 16 PLY (Dressing) ×1
SPONGE GZE PLS CTTN CRTY 4X4IN LF STRL (Sponge) ×1
SPONGE LAP 18X18IN PREWASH WHT (Sponge) ×1
SPONGE LAPAROTOMY L18 IN X W18 IN (Sponge) ×1
SPONGE LAPAROTOMY L18 IN X W18 IN PREWASH WHITE (Sponge) ×1 IMPLANT
SYRINGE LEUR LOK TIP 30 ML (Syringes, Needles) ×2 IMPLANT
SYSTEM BN CMNT CNCS SCULP PREP KT CURT (Ortho Supply) ×1
SYSTEM BONE CEMENT SCULP PREPARATION KIT (Ortho Supply) ×1
SYSTEM BONE CEMENT SCULP PREPARATION KIT CURETTE (Ortho Supply) ×1 IMPLANT
TOURNIQUET 34X4 PURPLE (Procedure Accessories) ×2 IMPLANT
TOWEL STERILE REUSABLE 8PK (Procedure Accessories) ×6 IMPLANT
TRAY TIB INTLK COCR ASCNT MXM 83MM CRCTE (Plate) ×1 IMPLANT
TRAY TIBIAL KNEE ASCENT MAXIM CRUCIATE (Plate) ×1 IMPLANT
TRAY TIBIAL KNEE ASCENT MAXIM CRUCIATE FIN L83 MM INTERLOK COCR (Plate) ×1 IMPLANT
WAX BN STRL 2.5G NTR (Hemostat)
WAX BONE 2.5 G NATURAL (Hemostat) IMPLANT

## 2016-08-20 NOTE — Anesthesia Postprocedure Evaluation (Signed)
Anesthesia Post Evaluation    Patient: Kent Jordan    Procedure(s) with comments:  ARTHROPLASTY, KNEE, TOTAL - RIGHT TOTAL KNEE REPLACEMENT    Anesthesia type: General ETT    Last Vitals:   Vitals:    08/20/16 1220   BP: 136/73   Pulse: 77   Resp: 20   Temp:    SpO2: 99%       Patient Location: Phase I PACU      Post Pain: Patient not complaining of pain, continue current therapy    Mental Status: awake and alert    Respiratory Function: tolerating room air    Cardiovascular: stable    Nausea/Vomiting: patient not complaining of nausea or vomiting    Hydration Status: adequate    Post Assessment: no apparent anesthetic complications          Anesthesia Qualified Clinical Data Registry    Central Line      CVC insertion : NO                                               Perioperative temperature management      General/neuraxial anesthesia > or = 60 minutes (excluding CABG) : YES              > Use of intraoperative active warming : YES              > Temperature > or = 36 degrees Centigrade (96.8 degrees Farenheit) during time span from 30 minutes before up to 15 minutes after anesthesia end time : YES      Administration of antibiotic prophylaxis      Age > or = 18, with IV access, with surgical procedure for which antibiotic prophylaxis indicated, and not on chronic antibiotics : YES              > Prophylactic antibiotics within 1 hour of incision (or fluroroquinolone/vancomycin within 2 hours of incision) : YES    Medication Administration      Ordering or administration of drug inconsistent with intended drug, dose, delivery or timing : NO      Dental loss/damage      Dental injury with administration of anesthesia : NO      Difficult intubation due to unrecognized difficult airway        Elective airway procedure including but not limited to: tracheostomy, fiberoptic bronchoscopy, rigid bronchoscopy; jet ventilation; or elective use of a device to facilitate airway management such as a Glidescope : NO                 > Unanticipated difficult intubation post pre-evaluation : NO      Aspiration of gastric contents        Aspiration of gastric contents : NO                    Surgical fire        Procedure requiring electrocautery/laser : YES                > Ignition/burning in invasive procedure location : NO      Immediate perioperative cardiac arrest        Cardiac arrest in OR or PACU : NO  Unplanned hospital admission        Unplanned hospital admission for initially intended outpatient anesthesia service : NO      Unplanned ICU admission        Unplanned ICU admission related to anesthesia occurring within 24 hours of induction or start of MAC : NO      Surgical case cancellation        Cancellation of procedure after care already started by anesthesia care team : NO      Post-anesthesia transfer of care checklist/protocol to PACU        Transfer from OR to PACU upon case conclusion : YES              > Use of PACU transfer checklist/protocol : YES     (Includes the key elements of: patient identification, responsible practitioner identification (PACU nurse or advanced practitioner), discussion of pertinent history and procedure course, intraoperative anesthetic management, post-procedure plans, acknowledgement/questions)    Post-anesthesia transfer of care checklist/protocol to ICU        Transfer from OR to ICU upon case conclusion : NO                    Post-operative nausea/vomiting risk protocol        Post-operative nausea/vomiting risk protocol : YES  Patient > or = 18 with care initiated by anesthesia team that has a risk factor screen for post-op nausea/vomiting (Includes male, hx PONV, or motion sickness, non-smoker, intended opioid administration for post-op analgesia.)    Anaphylaxis        Anaphylaxis during anesthesia services : NO    (Inclusive of any suspected transfusion reaction in association with blood-bank confirmed blood product incompatibility)              Signed by: Duard [name], 08/20/2016 12:52 PM

## 2016-08-20 NOTE — PT Eval Note (Signed)
[facility]   Physical Therapy Evaluation   Patient: Kent Jordan    MRN#: [id number]   Unit: [facility] TOWER 8  Bed: V409/W119.14    Discharge Recommendations:   Discharge Recommendation: Home with home health PT     DME Recommendation: in place      Assessment:   Kent Jordan is a 65 y.o. male admitted 08/20/2016. Pt was senn on the day of surgery for R TKA.  Pt did well with TE but felt a sharp pain when moving from flexion down into extension with the heel slide.  Pt was also experiencing occasional sharp pain at during the walk to the bathroom.  He reported he didn't feel his knee was stable although there didn't appear to be anything unusual beyond the general antalgic gt.  Nevertheless, ambulation was kept to a minimum at this session to be careful.  See below for details of session.  Pt presents with increased need for assistance and assistive device, decreased balance, increased fall risk, increased pain.  Patient will benefit from further PT intervention to address above limitations in order to maximize functional independence and safety with mobility.    Therapy Diagnosis: gait abnormality    Rehabilitation Potential/Prognosis: good for goals    Treatment Activities: eval, bed mob training, gt training, TKA TE: ap, gs, qs, heel slide, SAQ, SLR    Informed patient  of the risks and benefits of participating in PT.    Educated the patient to role of physical therapy, plan of care, goals of therapy and HEP, safety with mobility and ADLs, weight bearing precautions.    Plan:   Treatment/Interventions: gt training, transfer training, TE, TA,     PT Frequency: 4-5x/wk            Precautions and Contraindications:   Full weight bearing RLE  falls    Activity Orders  As tolerated     Consult received for Oretha Milch Burgo for PT Evaluation and Treatment.  Patient's medical condition is appropriate for Physical therapy intervention at this time.    Medical Diagnosis: Unilateral  post-traumatic osteoarthritis, right knee [M17.31]  Status post right knee replacement [Z96.651]      History of Present Illness:   Kent Jordan is a 65 y.o. male admitted on 08/20/2016 for ARTHROPLASTY, KNEE, TOTAL (Right)      Past Medical/Surgical History:  Past Medical History:   Diagnosis Date   . Arrhythmia     when on ARB   . Arthritis     psoriatic and osteoarthritis    . Back pain    . Blood disorder 11/2014    Hemochromatosis   . Diabetes mellitus 2016    started metformin 05/2016 d/t A1C 7.1 - now checking BS 2x/day - average 70-110 in the last week - A1C on 06/29/16 = 6.3 , has also lost 16 lbs since 05/2016   . Difficulty in walking(719.7)     Uses cane to ambulate   . Elevated liver function tests     had abd u/s 07/05/16    . Failed back syndrome    . Fatty liver    . Hemochromatosis     phlebotomy every 3 months - last done 02/2016   . High cholesterol    . Hypertensive disorder 06/2016    norvasc recently increased to 10 mg daily - now controlled on meds - most recent BP 117/73   . Low back pain    .  Lung nodule 2012    benign - was monitored x  3 yrs by Dr. Bernita Raisin [facility]) - signed off from pulmonology - no further f/u required since 2015   . Neuropathy     Tingling left foot, numbness left toes   . Psoriasis    . Psoriatic arthritis     tx w/ enbrel   . S/P insertion of spinal cord stimulator 2015   . Sleep apnea     mild - does not have CPAP - was unable to tolerate, less snoring since turboplasty      Past Surgical History:   Procedure Laterality Date   . ABDOMINAL SURGERY  03/1971    double hernia repair   . ARTHROSCOPY, KNEE Right 03/21/2016    Procedure: ARTHROSCOPY, KNEE, RIGHT, PARTIAL MEDIAL MENISCECTOMY, PARTIAL SYNOVECTOMY;  Surgeon: Verner Chol, MD;  Location: [facility];  Service: Orthopedics;  Laterality: Right;   . CATARACT EXT.WITH IOL Bilateral 2009   . FRACTURE SURGERY  1993    right middle finger   . INSERTION, SPINAL CORD STIMULATOR GENERATOR N/A 05/07/2014    Procedure: DORSAL  COLUMN STIMULATOR PLACEMENT;  Surgeon: Tera Helper, MD;  Location: [facility];  Service: Neurosurgery;  Laterality: N/A;  DCS PLACEMENT   . KNEE ARTHROCENTESIS  1983    right   . LUMBAR FUSION  10/2011   . MICRODISCECTOMY LUMBAR  06/2011, 07/2011   . SEPTOPLASTY  10/05/2015   . SKIN BIOPSY  11/2015    Negative           X-Rays/Tests/Labs:  Status post total knee replacement in anatomic alignment.    Social History:   Prior Level of Function:  Prior level of function: Independent with ADLs, Ambulates with assistive device  Assistive Device: Single point cane  Baseline Activity Level: Community ambulation  DME Currently at Home: Single point cane, Front wheel walker    Home Living Arrangements:  Living Arrangements: Spouse/significant other  Type of Home: House  Home Layout: Multi-level  DME Currently at Home: Single point cane, Front wheel walker    Subjective:    Patient is agreeable to participation in the therapy session. Nursing clears patient for therapy.     Pt reports he would like to go to the bathroom    Patient Goal: get back to bed    Pain:   Scale: 3/10  Location: R knee  Intervention: pain medication, mobility, reposition       Objective:    Patient is in bed with ace wrap and bandaging R knee,  IV , On Q pump in place.      Cognitive Status and Neuro Exam:  Arousal/Alertness: Appropriate responses to stimuli  Attention Span: Appears intact  Memory: Appears intact  Following Commands: Follows all commands and directions without difficulty  Safety Awareness: independent  Insights: Fully aware of deficits  Problem Solving: Able to problem solve independently    Patient is alert and oriented x 3 and follows directions without difficulty.     Sensation: intact to light touch; denies paresthesia     Postural Assessment:   Unremarkable     Musculoskeletal Examination  RLE ROM: WFL except knee which is 5 to 90 actively  LLE ROM: WFL     RLE Strength: WFL except knee which is 2+/5  LLE Strength: WFL      UE Strength and ROM:  WFL      Functional Mobility  Rolling: nt  Supine to Sit: mod  ind for increased time  Sit to Supine: cg for RLE  Scooting: ind at EOB  Sit to Stand:  cg  Stand to Sit: cg  Transfers: cg with rw    Ambulation  Weightbearing Status: full RLE  Device Used: rw  Level of assistance required: cg  Ambulation Distance: 15', 10'  Pattern: antalgic RLE, occasional sharp pain R knee  Stair Management: nt    PMP - Progressive Mobility Protocol   PMP Activity: Step 6 - Walks in Room  Distance Walked (ft) (Step 6,7):  (15', 10')      Balance  Static Sitting: normal  Dynamic Sitting: normal  Static Standing: fair + with rw  Dynamic Standing: fair + with rw    Coordination/Motor Planning   WNL    Participation Effort: good    Endurance: not challenged      Pt left supine with needs met with RN informed of status as well as outcome of PT session.    Patient left with call bell and phone within reach, SCD, fall mat. All needs met and all questions answered.    Goals:   Goals  Goal Formulation: With patient  Time for Goal Acheivement: 3 visits  Goals: Select goal  Pt Will Go Supine To Sit: independent  Pt Will Perform Sit To Supine: independent  Pt Will Transfer Bed/Chair: with rolling walker;modified independent  Pt Will Ambulate: 101-150 feet;with rolling walker;modified independent  Pt Will Go Up / Down Stairs: 1 flight;modified independent;With rail;With [facility]         Luther Bradley, P.T.  Pager number [phone]    Time of treatment:   PT Received On: 08/20/16  Start Time: 1530  Stop Time: 1624  Time Calculation (min): 54 min

## 2016-08-20 NOTE — Plan of Care (Signed)
Rapt at pre op class 05/21/16         Value   Score      What is your age group      2           65-65 =  2                       66-75 = 1              > 75=0    Gender            Male = 2         2        Male= 1         How far on average can you walk?     A block being 200 ft           Housebound more of time =  0      2    2 or more = 2  1 or 2 = 1  Housebound most of time = 1    What kind of gait aid do you use?         1    None = 2    Single point cane = 1    Crutches/walker =  0    Do you use community support.    Home help, meals on wheels,         District nursing            1   None or 1 per week = 1  Two or more/week  = 0       Will you live with someone       3   who can help care for you      After surgery?     Yes = 3   No = 0        Total  11

## 2016-08-20 NOTE — Transfer of Care (Signed)
Anesthesia Transfer of Care Note    Patient: Artemis Loyal Barros    Procedures performed: Procedure(s) with comments:  ARTHROPLASTY, KNEE, TOTAL - RIGHT TOTAL KNEE REPLACEMENT    Anesthesia type: General ETT    Patient location:PACU    Last vitals:   Vitals:    08/20/16 1037   BP: 153/73   Pulse: 92   Resp: 14   Temp: 36.2 C (97.1 F)   SpO2: 99%       Post pain: Patient not complaining of pain, continue current therapy      Mental Status:awake and alert     Respiratory Function: tolerating face mask    Cardiovascular: stable    Nausea/Vomiting: patient not complaining of nausea or vomiting    Hydration Status: adequate    Post assessment: no apparent anesthetic complications, no reportable events and no evidence of recall    Signed by: Wilkie Aye  08/20/16 10:37 AM

## 2016-08-20 NOTE — Op Note (Addendum)
OP NOTE     Date Time: 10:07 AM, 08/20/2016      Patient Name:   [facility]     Date of Operation:   08/20/2016    Providers Performing:   Surgeon(s) and Role:     Harriet Masson, MD - Primary     * Donzetta Sprung, [place], CSA - First Assist     * Lilia Pro, [place] - Second Assist    Please page the surgical assistant or Ortho on call ([phone]) for order clarifications    Diagnosis:   Right knee osteoarthritis    Procedure:   Procedure(s) (LRB):  ARTHROPLASTY, KNEE, TOTAL (Right)      Anesthesia:    General   Duard [name], MD   Anesthesiologist: Duard [name], MD  CRNA: Berneta Levins A, CRNA     Estimated Blood Loss:   50cc    Implants:     Implant Name Type Inv. Item Serial No. Manufacturer Lot No. LRB No. Used Action   CEMENT Kimberlee Nearing [id number] Cement CEMENT Kimberlee Nearing  [employer] [id number] Right 1 Implanted   PATELLA SERIES A  34X8. - [id number] Patella PATELLA SERIES A  34X8.  BIOMET [id number] Right 1 Implanted   COMPNT FEMORAL CR VANGRD - [id number] Component COMPNT FEMORAL CR VANGRD  BIOMET [id number] Right 1 Implanted   PLATE INTRLK LCKG BAR - [id number] Plate PLATE INTRLK LCKG BAR  BIOMET [id number] Right 1 Implanted   BEARING E1 VANGRD TIB 14X83MM - [id number] Bearing BEARING E1 VANGRD TIB 14X83MM   BIOMET [id number] Right 1 Implanted         Drains:   None    Complications:   None    Operative Details:     The patient was brought to the preoperative holding area and positively identified. The operative extremity was marked, and then he was taken to the operating room where spinal anesthetic was induced without difficulty.  The patient was given 2 g IV Ancef for antibiotic prophylaxis. A well-padded tourniquet was applied to the proximal right thigh. The knee and leg were prepped and draped in the usual sterile orthopedic fashion.   A 5-inch longitudinal midline incision was made centered over the patient's knee through the skin and subcutaneous tissues down to the level of  the joint capsule whereupon a medial parapatellar arthrotomy was performed. The medial and lateral menisci as well as a portion of the retropatellar fat pad were excised.  Distal femoral bone cuts were made perpendicular to the patient's mechanical axis, for a size 67.79mm CR femoral component. The proximal tibia was resected also perpendicular to the patients mechanical axis. The proximal tibia was measured at a size 83mm, and the proximal tibial keel cut was made.    Subsequently, the articular undersurface of the patella was resected to accommodate a size 34 x 8.25mm oval, 3 peg polyethylene patellar button, the 3 peg holes for which were drilled. With the above-sized trial components in place, the 14mm AS trial tibial polyethylene spacer was found to produce a full functional stable range of motion from 0 degrees of extension to 135 degrees of flexion with no varus, valgus, or anterior-posterior laxity and central patellar tracking.   At this point, the leg was exsanguinated with an Esmarch, and the tourniquet was inflated for the cementation portion of the procedure. The trial implants were removed.  The cut bony surfaces were copiously pulsatile lavage irrigated and dried.  The tibial and patellar implants were cemented in place, and a press-fit femoral component was used due to his excellent bone quality, and tight fit with the trial implant, and the knee was held in full extension, and the patella was held clamped until the bone cement had fully hardened. A periarticular injection of local anesthetic was performed extravascularly for postoperative analgesia. After the bone cement had fully hardened, the trial tibial spacer was once again reconfirmed to produce a full functional stable range of motion.  The tourniquet was let down, and hemostasis was obtained. The trial tibial spacer was removed and the actual 14mm AS Vitamin-E infused highly crosslinked polyethylene tibial spacer was impacted securely into  position. The knee was copiously pulsatile lavage irrigated.  The arthrotomy was carefully repaired with multiple interrupted #1 Vicryl sutures.  The subcutaneous tissues were reapproximated in layers with multiple #1 Vicryl sutures, followed by 2-0 Vicryl sutures, followed by staples on the skin. A sterile dressing was applied, followed by a gently compressive Ace wrap.  The patient was then awakened and transferred to the recovery room in stable condition where he received a postoperative adductor canal nerve block.    Please note that no qualified resident was available to assist with this case, and that the certified, trained assistant utilized in this case was medically necessary for positioning, preparation, retraction, manipulation, implant placement, wound closure and other operative tasks that required orthopaedic skill, knowledge, experience and expertise to perform important duties during the procedure, without whom the procedure would have been significantly more complicated, and taken considerably more time.

## 2016-08-20 NOTE — Progress Notes (Signed)
I have reviewed the prior patient H&P. I have seen and evaluated the patient today and there are no significant changes from the previous clinical info in the H&P.Heart RRR, Lungs CTAB. I reviewed the consent and discussed the risks, potential benefits, and alternatives of proposed surgery including but not limited to death, infection, persistent and possibly increased pain, blood clots, loss of limb, failure of the components, limb deformity or leg length discrepancy, rotational deformity, neurovascular injury, the possible need for a transfusion, the possible need for future surgery, pneumonia, heart arrhythmia, heart attack, stroke, and death. After discussion of above, the patient has decided to proceed with surgery.We will proceed to surgery as scheduled.

## 2016-08-20 NOTE — Anesthesia Procedure Notes (Signed)
Peripheral    Patient location during procedure: OR  Reason for block: Post-op pain managment  Injection technique: Catheter  Block Region: Adductor canal/Mid-thigh femoral  Laterality: Right  Block at surgeon's request Yes    Staffing  Performed: Anesthesiologist     Pre-procedure Checklist   Completed: patient identified, surgical consent, pre-op evaluation, timeout performed, risks and benefits discussed, anesthesia consent given and correct site      Peripheral Block  Patient monitoring: Pulse oximetry, EKG, NIBP and Circuit O2  Patient position: Supine    Needle  Needle type: Tuohy   Needle gauge: 19 G  Needle length: 10 cm  Catheter size: 20 G  Catheter at skin depth: 7 cm    Procedures: ultrasound guided  Ultrasound Guided: LA spread visualized, Needle visualized, Relevant anatomy identified (nerve, vessels, muscle), Image stored or printed, Catheter visualized and Air contrast visualized      Assessment   Incremental injection: yes  Injection made incrementally with aspirations every 5 mL.  Injection Resistance: no  Paresthesia Pain: No    Blood Aspirated: No  no suspected intravascular injection  Patient tolerated procedure well: Yes  Block Outcome: No complications

## 2016-08-20 NOTE — Anesthesia Preprocedure Evaluation (Signed)
Anesthesia Evaluation    AIRWAY    Mallampati: III    TM distance: <3 FB  Neck ROM: full  Mouth Opening:full   CARDIOVASCULAR    regular and normal       DENTAL    no notable dental hx     PULMONARY    clear to auscultation     OTHER FINDINGS              Relevant Problems   No active problems are marked relevant to this note.       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 Spinal cord stimulator in situ  HTN  OSA  Hemachromatosis  DM        Anesthesia Plan    ASA 3     general                             Post op pain management: PNB catheter    informed consent obtained    ECG reviewed  pertinent labs reviewed             Signed by: Duard [name] 08/20/16 7:00 AM

## 2016-08-20 NOTE — Plan of Care (Signed)
Problem: Knee Surgery  Goal: Hemodynamic Stability  Outcome: Progressing  stable  Goal: Pain at adequate level as identified by patient  Outcome: Progressing  Under control ,prn meds  given po  Goal: Stable Neurovascular Status  Outcome: Progressing  intact  Goal: Free from Infection  Outcome: Progressing  Monitoring vs,labs and dsg  Goal: Mobility/activity is maintained at optimum level for patient  Outcome: Progressing  Plan for PT& OT   Goal: Patient will maintain normal GI status  Outcome: Progressing  No nausea,no vomiting  Goal: Address patient self-management plan  Emotional support provided    Problem: Psychosocial and Spiritual Needs  Goal: Demonstrates ability to cope with hospitalization/illness  Outcome: Progressing  Emotional support provided

## 2016-08-20 NOTE — PACU (Signed)
XRay done to right knee in PACU.

## 2016-08-20 NOTE — Discharge Instr - AVS First Page (Addendum)
Reason for your Hospital Admission:    R Total Knee Replacement      Instructions for after your discharge:    Mixing alcohol and pain medications can cause dizziness and slow your breathing.    Don't drink alcoholic beverages while taking pain medications.           Dr. Josiah Lobo Discharge Summary  Total Knee Replacement    Home Health Discharge Information:  . Name of Home Health Agency:   . Physical therapy and Home Health provide in-home care about 3 times per week for the first 2 weeks following surgery.     1st Post-Operative Appointment:   . Please make sure you have a 1st post-operative appointment scheduled for approximately 2 weeks after surgery. If you do not have one scheduled, please call our office to arrange this [phone]). You will have your staples removed at this appointment.    Dressing changes:  . Dressing changes begin on post-op day 2 and can continue to be changed every day, or every other once the incision is dry.   . Wash hands first, then apply a thin layer of antibiotic ointment (Neosporin or Bacitracin) to the incision, and use gauze to cover the incision and a gentle ACE wrap over the knee to hold it on.     Showering:   . You should keep the incision dry until the 1st post-op appointment. Either use a waterproof bandage when taking a shower, or cover the knee with a plastic bag to prevent getting wet. The incision can get wet 24 hours after staples are removed, but avoid submerging the knee under water until 3 weeks after surgery.    Anticoagulation:   . A blood-thinner is prescribed in order to reduce the risk of blood clots. Usually, a prescription for Lovenox is given at the pre-op appointment. Please take this prescription to your pharmacy prior to surgery to allow time for the prior-authorization process. After discharge from the hospital, inject once daily for 14 days. After the Lovenox prescription is completed, please take 325mg  Aspirin once daily for an additional 4 weeks.  *Sometimes, based on your medical history, a different anticoagulant medication may be used.    Icing:   . Ice your knee with an ice bag, or bag of frozen peas/corn, as tolerated. If you have arranged to have an ice machine after your knee surgery, use as instructed. Be sure not to sit around for too long "tethered" to the machine - it is important to get up at least once an hour during the day.    On-Q Pump:   . Usually is stopped on the third day after surgery.  Follow instructions for care/removal.  If you have any questions regarding your On-Q Pump or have questions regarding the removal of the catheter, please call the 24 hour Nurse Support Hotline at [phone]).    Compression stockings:  . Compression stockings (TED stockings) are provided in the hospital and need to be worn continuously until your 1st post-op appointment. You may remove them for showering, or if desired for sleep at night, but should be worn all day. You will need help taking these on and off.    Outpatient Physical Therapy:  . An order for Outpatient physical therapy will be given at your 1st post-op appointment. Please schedule the Outpatient PT appointment in a timely fashion so that there is no significant gap between stopping Home PT and beginning Outpatient PT, since it is very important to not  allow the knee to become stiff. Working very hard after surgery on regaining your range of motion in particular, is essential to a good outcome after knee replacement.  This can be painful and difficult, but it must be done, and cannot be delayed.    Call our office:    . Incision concerns:    o The most common patient concern after surgery is a fear of infection.  It is true that signs of infection are redness, warmth, and swelling.  But early after surgery, normal healing incisions are red, warm, and swollen, with an expected gradual improvement with time.  What is concerning is if there is steadily increasing redness, warmth, and  swelling, pus-like drainage, fevers (greater than 101.0), or increasing pain.  If you have any concern about infection, please call our office for evaluation.    . Urinary symptoms:  o Frequent urination, burning with urination, "cloudy" urine, or foul odor can be signs of a urinary infection.  Call our office if there are concerns, and we will likely order a urine test to check.    . Signs of possible "blood clots" or deep vein thrombosis (DVT) in the leg:  o The risk of blood clots in the leg is elevated for about 6 weeks after joint replacement.  Use of your blood thinning medication, TED stockings, ankle pumps, regular walking (getting up at least once an hour), and staying hydrated all help to reduce this risk.  However, if you notice signs of pain or firm swelling in the calf, contact us for evaluation.  If you have chest pain, or shortness of breath associated with these symptoms, this can be a sign of a clot that has traveled to the lungs, which can be very dangerous, so we will recommend that you immediately go to the ER for evaluation.     . Fever: Slightly elevated temperature is common in the first week after surgery, and should resolve with mobilization, and deep breathing exercises.  If, however, you notice that fevers are increasing, or exceeds 101.0, please call our office for evaluation.  The most common causes of fever after surgery is actually not a knee infection, but often related to the urine or the lungs.      Infection Risk:  . Prosthetic joints can become infected if there is infection in other parts of your body. If you develop a deep cut or puncture wound, clean it well, put a sterile bandage on it, and notify your doctor. Animal bites should always be reported to your doctor to get treatment. Pedicures also increase your risk of infection, and should be avoided for at least 6 months after your surgery. The closer the injury is to your joint, the greater the concern. Occasionally,  antibiotics may be needed.     Dentist:   . Dr. Josiah Lobo recommends lifetime antibiotics prior to dental procedures. Please wait a minimum of 3 months after your joint replacement surgery prior to elective dental procedures, including cleaning. Please tell your dentist that you have a prosthesis prior to your dental appointment so that they can give you a prescription for an antibiotic.     Driving:  . It is usually okay to drive when you have transitioned from the walker to a cane.  You need to have the strength and reaction time in your leg for an emergency stop. This is typically 2-3 weeks after left leg surgery, and 3-6 weeks after right leg surgery.  Please practice in an  empty parking lot prior to driving. DO NOT DRIVE WHILE TAKING NARCOTIC PAIN MEDICATION.     Flying/Roadtrips:  . In order to reduce the risk of developing blood clots, try to avoid flying/long roadtrips for 6 weeks after surgery.  If you do travel, using your TED stockings, taking Aspirin, staying hydrated, ankle pumps, and getting up to walk around at least once an hour is recommended.    Post-op medications:   . Please reference your discharge medication list for information regarding the medications you will be taking after discharge.     Constipation Card          Home Health Discharge Information     Your doctor has ordered Physical Therapy in-home service(s) for you while you recuperate at home, to assist you in the transition from hospital to home.      The agency that you or your representative chose to provide the service:  Name of Home Health Agency:  [facility] ([phone])]    The above services were set up by:  Marigene Ehlers  (Home Health Liaison)                     [facility] Anesthesiology and Pain Medicine    [facility] WITH A NERVE BLOCKCATHETER DISCHARGE INSTRUCTIONS  WHAT TO EXPECT WITH YOUR CATHETER(S):   Numbess, tingling, and weakness in area blocked is NORMAL.   Some pain or discomfort is NORMAL.  Take  pain medication as prescribed.   Some fluid leaking around the catheter or dressing is expected and okay.  Reinforce with dressing to keep catheter site covered.   Do not change the dressing or tape, you will REMOVE the catheter!   Did you have surgery on your hip, leg, knee or foot? DO NOT GET UP ALONE.  Ask for help to avoid falling ! Be Careful Walking.   Did you have surgery on your shoulder, clavicle, arm or hand? You may have any of these symptoms.  This is expected and will wear off !    Droopy eye, stuffy nose, pin-point pupil, hoarseness, weak voice.   Feeling that you cannot take a deep breath.  Have questions? Call ON-Q specialists at this number FIRST: [phone]   Is my On-Q pump working?   The ball is not getting smaller, what do I do?   Why is my catheter leaking?   My catheter dressing/tape is loose, what do I do?   How do I remove the catheter(s)?   [email or web address]   Click on 'My ON-Q Pump'. Scroll down to patient education and click on removal of Catheter Video.  If you still have questions, call the [facility] service: [phone]   If you have SEVERE pain, redness or swelling around catheter, Temp>102   If numbness doesn't go away AFTER 24 hours of removal of catheter   For  Numbness/tingling around the mouth, metallic taste, ringing in ears: Clamp your catheter and IMMEDIATELY call the anesthesiology pain medicine service   Extremely rare symptoms include confusion, seisures, fainting, unusual chest pain: Call 911  When is the infusion complete?  You can remove the catheter on this Date and Time  Date:_________________  Time:_________________

## 2016-08-21 ENCOUNTER — Encounter: Payer: Self-pay | Admitting: Orthopaedic Surgery

## 2016-08-21 LAB — BASIC METABOLIC PANEL
BUN: 13 mg/dL (ref 9.0–28.0)
CO2: 26 mEq/L (ref 21–29)
Calcium: 8.6 mg/dL (ref 8.5–10.5)
Chloride: 105 mEq/L (ref 100–111)
Creatinine: 0.8 mg/dL (ref 0.5–1.5)
Glucose: 120 mg/dL — ABNORMAL HIGH (ref 70–100)
Potassium: 4.3 mEq/L (ref 3.5–5.1)
Sodium: 138 mEq/L (ref 135–146)

## 2016-08-21 LAB — CBC
Absolute NRBC: 0 10*3/uL
Hematocrit: 37 % — ABNORMAL LOW (ref 42.0–52.0)
Hgb: 12.7 g/dL — ABNORMAL LOW (ref 13.0–17.0)
MCH: 31.7 pg (ref 28.0–32.0)
MCHC: 34.3 g/dL (ref 32.0–36.0)
MCV: 92.3 fL (ref 80.0–100.0)
MPV: 9.6 fL (ref 9.4–12.3)
Nucleated RBC: 0 /100 WBC (ref 0.0–1.0)
Platelets: 228 10*3/uL (ref 140–400)
RBC: 4.01 10*6/uL — ABNORMAL LOW (ref 4.70–6.00)
RDW: 12 % (ref 12–15)
WBC: 10.82 10*3/uL — ABNORMAL HIGH (ref 3.50–10.80)

## 2016-08-21 LAB — GLUCOSE WHOLE BLOOD - POCT
Whole Blood Glucose POCT: 120 mg/dL — ABNORMAL HIGH (ref 70–100)
Whole Blood Glucose POCT: 103 mg/dL — ABNORMAL HIGH (ref 70–100)
Whole Blood Glucose POCT: 108 mg/dL — ABNORMAL HIGH (ref 70–100)
Whole Blood Glucose POCT: 117 mg/dL — ABNORMAL HIGH (ref 70–100)

## 2016-08-21 LAB — GFR: EGFR: >60

## 2016-08-21 LAB — HEMOLYSIS INDEX: Hemolysis Index: 26 — ABNORMAL HIGH (ref 0–18)

## 2016-08-21 MED ORDER — DSS 100 MG PO CAPS
100.0000 mg | ORAL_CAPSULE | Freq: Two times a day (BID) | ORAL | 0 refills | Status: DC
Start: 2016-08-21 — End: 2016-09-05

## 2016-08-21 MED ORDER — ENOXAPARIN SODIUM 30 MG/0.3ML SC SOLN
30.0000 mg | Freq: Two times a day (BID) | SUBCUTANEOUS | 0 refills | Status: AC
Start: 2016-08-21 — End: 2016-09-12
  Filled 2016-08-22: qty 12.6, 21d supply, fill #0

## 2016-08-21 MED ORDER — OXYCODONE HCL 5 MG PO TABS
5.0000 mg | ORAL_TABLET | ORAL | 0 refills | Status: DC | PRN
Start: 2016-08-21 — End: 2016-09-05
  Filled 2016-08-22: qty 60, 10d supply, fill #0

## 2016-08-21 MED ORDER — ONDANSETRON 4 MG PO TBDP
4.0000 mg | ORAL_TABLET | Freq: Three times a day (TID) | ORAL | 0 refills | Status: DC | PRN
Start: 2016-08-21 — End: 2016-09-05
  Filled 2016-08-22: qty 10, 4d supply, fill #0

## 2016-08-21 NOTE — Progress Notes (Signed)
Home Health Referral          Referral from Shona Simpson (Case Manager) for home health care upon discharge.    By [employer], the patient has the right to freely choose a home care provider.  Arrangements have been made with:     A company of the patients choosing. We have supplied the patient with a listing of providers in your area who asked to be included and participate in Medicare.   [facility], a home care agency that provides both adult home care services which is a wholly owned and operated by [employer] and participates in [employer]   The preferred provider of your insurance company. Choosing a home care provider other than your insurance company's preferred provider may affect your insurance coverage.    The Home Health Care Referral Form acknowledging the voluntary selection of the home care company has been completed, signed, and is on file.      Home Health Discharge Information     Your doctor has ordered Physical Therapy in-home service(s) for you while you recuperate at home, to assist you in the transition from hospital to home.      The agency that you or your representative chose to provide the service:  Name of Home Health Agency: [facility] [phone])]    The above services were set up by:  Marigene Ehlers  [facility] Liaison)   Phone   [phone]    Signed by: Marigene Ehlers  Date Time: 08/21/16 12:14 PM

## 2016-08-21 NOTE — UM Notes (Signed)
08/20/16 1019  Admit to Inpatient        Diagnosis:   Right knee osteoarthritis    Procedure:   Procedure(s) (LRB):  ARTHROPLASTY, KNEE, TOTAL (Right)      Vs-97.1, 92, 14, 153/73, 100% 2L O2 nc,   Labs: glucose 121, 172, 181, 159,     NPO for OR, Iv ancef preop, then q8h, iv toradol x 1, iv tranexamic acid x 1, ivf @ 100, ropivacaine on-q pump continuous, iv fentanyl prn x 4, iv dilaudid prn x 4, reg insulin sq prn x 1, cc/dm diet, pt/ot eval,     Cordelia Pen MSN, [employer], ACM  Utilization Review Case Manager  [employer]  [phone]

## 2016-08-21 NOTE — Plan of Care (Signed)
Problem: Knee Surgery  Goal: Hemodynamic Stability  Outcome: Progressing   08/21/16 1884   Goal/Interventions addressed this shift   Hemodynamic stability  Monitor/assess vital signs;Monitor SpO2 and treat as needed     Goal: Stable Neurovascular Status  Outcome: Progressing   08/21/16 0519   Goal/Interventions addressed this shift   Stable neurovascular status  Assess and document plantar/dorsiflexion every 4 hours;Monitor/assess neurovascular status (pulses, capillary refill, pain, paresthesia, presence of edema);Monitor/assess for return of sensation after nerve block therapy if indicated       Problem: Moderate/High Fall Risk Score >5  Goal: Patient will remain free of falls  Outcome: Progressing   08/21/16 0519   OTHER   High (Greater than 13) MOD-Place Fall Risk level on whiteboard in room

## 2016-08-21 NOTE — Progress Notes (Signed)
Attending Addendum/Attestation:    I have seen the patient and reviewed the pertinent clinical information, and agree with the physical exam, patient history, and planning as documented by the Resident or Physician Assistant. In addition, I have edited this note where appropriate to reflect my findings and plan as well as to incorporate any new data.    -POD#1 s/p R TKA  -AVSS, pain controlled  -PT/OT  -anticipate d/c to home tomorrow with HHPT    Harriet Masson, M.D.  [facility]  Joint Replacement Specialist  [address], [address]  [place], [place] [address]  P: [phone]  F: [phone]    Orthopedic Daily Progress Note    08/21/2016   5:32 AM    Kent Jordan is a 65 y.o. male POD#1 from R TKA      Subjective: Pain is well controlled this AM, no new complaints. Patient Denies nausea, vomiting, or fevers.     Physical Exam:  Vitals:    08/21/16 0313   BP: 139/63   Pulse: 70   Resp: 16   Temp: (!) 96.1 F (35.6 C)   SpO2: 99%        Intake/Output Summary (Last 24 hours) at 08/21/16 0532  Last data filed at 08/20/16 1756   Gross per 24 hour   Intake             1750 ml   Output             1525 ml   Net              225 ml     Results     Procedure Component Value Units Date/Time    Basic metabolic panel [[id number]]  (Abnormal) Collected:  08/21/16 0308    Specimen:  Blood Updated:  08/21/16 0438     Glucose 120 (H) mg/dL      BUN 69.6 mg/dL      Creatinine 0.8 mg/dL      Calcium 8.6 mg/dL      Sodium 295 mEq/L      Potassium 4.3 mEq/L      Chloride 105 mEq/L      CO2 26 mEq/L     Hemolysis index [[id number]]  (Abnormal) Collected:  08/21/16 0308     Updated:  08/21/16 0438     Hemolysis Index 26 (H)    GFR [[id number]] Collected:  08/21/16 0308     Updated:  08/21/16 0438     EGFR >60.0    CBC [[id number]]  (Abnormal) Collected:  08/21/16 0308    Specimen:  Blood from Blood Updated:  08/21/16 0430     WBC 10.82 (H) x10 3/uL      Hgb 12.7 (L) g/dL      Hematocrit 95.6 (L) %       Platelets 228 x10 3/uL      RBC 4.01 (L) x10 6/uL      MCV 92.3 fL      MCH 31.7 pg      MCHC 34.3 g/dL      RDW 12 %      MPV 9.6 fL      Nucleated RBC 0.0 /100 WBC      Absolute NRBC 0.00 x10 3/uL     Glucose Whole Blood - POCT [[id number]]  (Abnormal) Collected:  08/20/16 2116     Updated:  08/20/16 2120     POCT - Glucose Whole blood 159 (H) mg/dL  Glucose Whole Blood - POCT [[id number]]  (Abnormal) Collected:  08/20/16 1626     Updated:  08/20/16 1631     POCT - Glucose Whole blood 181 (H) mg/dL     Glucose Whole Blood - POCT [[id number]]  (Abnormal) Collected:  08/20/16 1036     Updated:  08/20/16 1038     POCT - Glucose Whole blood 172 (H) mg/dL     Type and Screen [[id number]] Collected:  08/20/16 0657    Specimen:  Blood Updated:  08/20/16 0822     ABO Rh O POS     AB Screen Gel NEG    Glucose Whole Blood - POCT [[id number]]  (Abnormal) Collected:  08/20/16 0709     Updated:  08/20/16 1937     POCT - Glucose Whole blood 121 (H) mg/dL           Right Lower Extremity:  Dressing c/d/i                                                                                                                     +PF/DF/EHL                                                                                                                       SILT SP/DP/T                                                                                                                     2+ DP/PT  Cap refill <2 secs          Assessment: s/p Procedure(s) (LRB):  ARTHROPLASTY, KNEE, TOTAL (Right)    Plan:   Mobility: Out of bed as tolerated with PT/OT   Pain control: Continue to wean/titrate to appropriate oral regimen   DVT Prophylaxis (per Ortho service Protocol): Lovenox for 3 weeks post-op   Foley catheter status: Does not have Foley   Further surgical plans: No further Orthopaedic plans      RUE: WBAT  LUE:  WBAT   RLE:  WBAT   LLE:  WBAT   Disposition: Planning for D/C home in 1-2 days    Kent Jordan

## 2016-08-21 NOTE — OT Eval Note (Signed)
[facility]   Occupational Therapy Evaluation     Patient: Kent Jordan    MRN#: [id number]   Unit: [facility] TOWER 8  Bed: V409/W119.14                                     Discharge Recommendations:   Discharge Recommendation: Home with supervision (and assistance from fmaily for higher level adls)   DME Recommended for Discharge:  (RTS, shower chair)        Assessment:   Kent Jordan is a 65 y.o. male admitted 08/20/2016 for TKA. Patient presents with decreased adl I, functional mobility, actiivty tolerance and endurance. Patient will benefit from continued OT to maximize adls    Impairments: Assessment: decreased independence with ADLs    Therapy Diagnosis: decreased adl I    Rehabilitation Potential: Prognosis: Good;With continued OT s/p acute discharge     Treatment Activities: OT Eval, adl re training, functional transfer training    Educated the patient to role of occupational therapy, plan of care, goals of therapy and HEP, safety with mobility and ADLs.    Plan:   OT Frequency Recommended: 4-5x/wk     Treatment Interventions: ADL retraining;Functional transfer training;Patient/Family training;Equipment eval/education     Risks/benefits/POC discussed with patient         Precautions and Contraindications:   Precautions  Weight Bearing Status: no restrictions  Precaution Instructions Given to Patient: Yes  Other Precautions: FALLS      Consult received for Oretha Milch Branca for OT Evaluation and Treatment.  Patient's medical condition is appropriate for Occupational Therapy intervention at this time.    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 male admitted on 08/20/2016 with R TKA      Past Medical/Surgical History:  Past Medical History:   Diagnosis Date   . Arrhythmia     when on ARB   . Arthritis     psoriatic and  osteoarthritis    . Back pain    . Blood disorder 11/2014    Hemochromatosis   . Diabetes mellitus 2016    started metformin 05/2016 d/t A1C 7.1 - now checking BS 2x/day - average 70-110 in the last week - A1C on 06/29/16 = 6.3 , has also lost 16 lbs since 05/2016   . Difficulty in walking(719.7)     Uses cane to ambulate   . Elevated liver function tests     had abd u/s 07/05/16    . Failed back syndrome    . Fatty liver    . Hemochromatosis     phlebotomy every 3 months - last done 02/2016   . High cholesterol    . Hypertensive disorder 06/2016    norvasc recently increased to 10 mg daily - now controlled on meds - most recent BP 117/73   . Low back pain    . Lung nodule 2012    benign - was monitored x  3 yrs by Dr. Bernita Raisin [facility]) - signed off from pulmonology - no further f/u required since 2015   . Neuropathy     Tingling left foot, numbness left toes   . Psoriasis    . Psoriatic arthritis     tx w/ enbrel   .  S/P insertion of spinal cord stimulator 2015   . Sleep apnea     mild - does not have CPAP - was unable to tolerate, less snoring since turboplasty     Past Surgical History:   Procedure Laterality Date   . ABDOMINAL SURGERY  03/1971    double hernia repair   . ARTHROPLASTY, KNEE, TOTAL Right 08/20/2016    Procedure: ARTHROPLASTY, KNEE, TOTAL;  Surgeon: Harriet Masson, MD;  Location: [facility];  Service: Orthopedics;  Laterality: Right;  RIGHT TOTAL KNEE REPLACEMENT   . ARTHROSCOPY, KNEE Right 03/21/2016    Procedure: ARTHROSCOPY, KNEE, RIGHT, PARTIAL MEDIAL MENISCECTOMY, PARTIAL SYNOVECTOMY;  Surgeon: Verner Chol, MD;  Location: [facility];  Service: Orthopedics;  Laterality: Right;   . CATARACT EXT.WITH IOL Bilateral 2009   . FRACTURE SURGERY  1993    right middle finger   . INSERTION, SPINAL CORD STIMULATOR GENERATOR N/A 05/07/2014    Procedure: DORSAL COLUMN STIMULATOR PLACEMENT;  Surgeon: Tera Helper, MD;  Location: [facility];  Service: Neurosurgery;  Laterality: N/A;  DCS  PLACEMENT   . KNEE ARTHROCENTESIS  1983    right   . LUMBAR FUSION  10/2011   . MICRODISCECTOMY LUMBAR  06/2011, 07/2011   . SEPTOPLASTY  10/05/2015   . SKIN BIOPSY  11/2015    Negative         Imaging/Tests/Labs:  X-ray Knee Right Ap Lateral And Axial    Result Date: 08/20/2016   Status post  total knee replacement in anatomic alignment. Jerald Kief, MD 08/20/2016 12:00 PM       Social History:   Prior Level of Function:  Prior level of function: Independent with ADLs, Ambulates with assistive device  Assistive Device: Single point cane  Baseline Activity Level: Community ambulation  DME Currently at Home: Single point cane, Front wheel walker    Home Living Arrangements:  Living Arrangements: Spouse/significant other  Type of Home: House  Home Layout: Multi-level  Bathroom Shower/Tub: Pension scheme manager: Standard  DME Currently at Home: Single point cane, Front wheel walker      Subjective:     Patient is agreeable to participation in the therapy session.     Pain:7/10  R LE  Pre medicated via nsg    Patient goal- home              Objective:        Observation of Patient/Vital Signs:  Patient is in bed with dressings, SCD's and peripheral IV in place.    Cognitive Status and Neuro Exam:        intact         Musculoskeletal Examination  Gross ROM  Right Upper Extremity ROM: within functional limits  Left Upper Extremity ROM: within functional limits    Gross Strength  Right Upper Extremity Strength: within functional limits  Left Upper Extremity Strength: within functional limits              Sensory/Oculomotor Examination        touch- intact  Vision -0intact    Activities of Daily Living  Self-care and Home Management  Eating: Setup  Grooming: setup  Bathing: Moderate Assist;Maximal Assist  UB Dressing: set up  LB Dressing: Maximal Assist  Toileting: Maximal Assist  Functional Transfers: Maximal Assist    Functional Mobility:  Mobility and Transfers  Supine to Sit: Minimal Assist  Sit to Supine:  Minimal Assist  Sit to Stand: Minimal Assist  Bed to Chair: Minimal Assist     PMP - Progressive Mobility Protocol   PMP Activity: Step 7 - Walks out of Room  Distance Walked (ft) (Step 6,7):  (50)     Balance  Balance  Static Sitting Balance: good  Dyanamic Sitting Balance: fair  Static Standing Balance: fair    Participation and Activity Tolerance  Participation and Endurance  Participation Effort: good  Endurance: Tolerates 10 - 20 min exercise with multiple rests    Patient left with call bell within reach, all needs met, SCDs on, fall mat on, bed alarm off, chair alarm off and all questions answered. RN notified of session outcome and patient response.       Goals:  Time For Goal Achievement: 5 visits  ADL Goals  Patient will dress lower body: Modified Independent  Patient will toilet: Modified Independent  Mobility and Transfer Goals  Pt will perform functional transfers: Modified Independent                                Time of treatment:   OT Received On: 08/21/16  Start Time: 0845  Stop Time: 0925  Time Calculation (min): 40 min    Tora Perches OTR/L  Pager [phone]

## 2016-08-21 NOTE — Progress Notes (Addendum)
REGIONAL ANESTHESIA PAIN MEDICINE (RAPM) PROGRESS NOTE  ==================================================================  Date:  08/21/2016  Time: 2:17 PM      Patient Name: Kent Jordan, Kent Jordan    Surgical Procedure(s): Procedure(s) (LRB):  ARTHROPLASTY, KNEE, TOTAL (Right)    Assessment:  Catheter Functioning as expected.  Patient seen and examined. Agree with pain RN note.    Plan:  Please feel free to call the RAPM numbers below with any questions.  Continue current regimen.  Follow up in AM  ------------------------------------------------------------------------------------------------------------------  Marland Mcalpine  Department of Anesthesiology  [facility]  [facility]      Date:  08/21/2016  Time: 11:51 AM      Patient Name: Kent Jordan, Kent Jordan  Surgeon: Harriet Masson, MD    Visit Type:  Inpatient Room #   [phone]    Surgical Procedure(s): Procedure(s) (LRB):  ARTHROPLASTY, KNEE, TOTAL (Right)  Post-op Day: 1 Day Post-Op    Continuous Peripheral Nerve Catheter Present?: Yes    Catheter #1  Catheter Location: Adductor Canal  Laterality: Right  Days Catheter In Situ: 1  Infusion Medication and Rate: Ropivacaine 0.2% currently infusing at 8 mL/hr    Opioid Medication(s):       Oxycodone 10 mg  x  3 doses in 24 hours.       Oxycontin 10 mg PO  x  3 doses in 24 hours.    Adjuvant Analgesic Medication(s):       Acetaminophen 650 mg PO q6hrs       Celebrex 200 mg PO q24hrs       flexeril 10 mg Q day, Valium 5 mg Q day, cymbalta 60 mg Q day, trazadone 50 mg Q hs prn    24 Hour Vital Signs and Pain Score(s): Temp:  [96.1 F (35.6 C)-97.9 F (36.6 C)]   Heart Rate:  [9-82]   Resp Rate:  [12-20]   BP: (123-165)/(63-81)   SpO2:  [94 %-99 %]   Height:  [179.1 cm (5' 10.5")]   Weight:  [88.5 kg (195 lb)]   BMI (calculated):  [27.6]     Last recorded pain score:  Pain Scale Used: Numeric Scale (0-10)  Pain Score: 8-severe pain       Subjective:  Symptoms Include:  My pain level is about  a 8-9 but after I take oral pain meds it comes down well.    Objective: Pain score at rest:  4-5 recorded    Pain score with activity:  Will work with PT this afternoon - can increase to 9.                     Motor block:  No                     Sensory block:  Diminished                     Catheter site:   Clean, dry and intact.    Assessment:   Catheter is functioning as expected.    Plan:       - Continue local anesthetic infusion at current rate.       - RAPM will follow up in morning.   Discussed with pt and RN, if pain levels go up with activity will consider increasing rate to 10 mL/hr and watch motor function for quad weakness.    Would patient receive block again?  Yes    Level of Care: 2    ------------------------------------------------------------------------------------------------------------------  Melene Plan  Department of Anesthesiology  [facility]  [facility]    RAPM General Phone (All hours): [phone]  R[facility]M Pain Nurse (8am - 4pm): [phone]

## 2016-08-21 NOTE — Discharge Summary (Signed)
Orthopaedic assessment:  S/p R TKA           HPI    Kent Jordan is a 65 y.o. year old male.  He was admitted on 08/20/16 for an elective RTKA.  He was taken to the OR on the same day.  He tolerated the procedure well and was transferred to the floor in stable condition.  His pain was well controlled and he worked with PT without issue.  He was started on lovenox for dvt ppx.  He is appropriate for discharge at this time.    Past Medical and Surgical History      Past Medical History:   Diagnosis Date   . Arrhythmia     when on ARB   . Arthritis     psoriatic and osteoarthritis    . Back pain    . Blood disorder 11/2014    Hemochromatosis   . Diabetes mellitus 2016    started metformin 05/2016 d/t A1C 7.1 - now checking BS 2x/day - average 70-110 in the last week - A1C on 06/29/16 = 6.3 , has also lost 16 lbs since 05/2016   . Difficulty in walking(719.7)     Uses cane to ambulate   . Elevated liver function tests     had abd u/s 07/05/16    . Failed back syndrome    . Fatty liver    . Hemochromatosis     phlebotomy every 3 months - last done 02/2016   . High cholesterol    . Hypertensive disorder 06/2016    norvasc recently increased to 10 mg daily - now controlled on meds - most recent BP 117/73   . Low back pain    . Lung nodule 2012    benign - was monitored x  3 yrs by Dr. Bernita Raisin [facility]) - signed off from pulmonology - no further f/u required since 2015   . Neuropathy     Tingling left foot, numbness left toes   . Psoriasis    . Psoriatic arthritis     tx w/ enbrel   . S/P insertion of spinal cord stimulator 2015   . Sleep apnea     mild - does not have CPAP - was unable to tolerate, less snoring since turboplasty        Past Surgical History:   Procedure Laterality Date   . ABDOMINAL SURGERY  03/1971    double hernia repair   . ARTHROSCOPY, KNEE Right 03/21/2016    Procedure: ARTHROSCOPY, KNEE, RIGHT, PARTIAL MEDIAL MENISCECTOMY, PARTIAL SYNOVECTOMY;  Surgeon: Verner Chol, MD;  Location: [facility];   Service: Orthopedics;  Laterality: Right;   . CATARACT EXT.WITH IOL Bilateral 2009   . FRACTURE SURGERY  1993    right middle finger   . INSERTION, SPINAL CORD STIMULATOR GENERATOR N/A 05/07/2014    Procedure: DORSAL COLUMN STIMULATOR PLACEMENT;  Surgeon: Tera Helper, MD;  Location: [facility];  Service: Neurosurgery;  Laterality: N/A;  DCS PLACEMENT   . KNEE ARTHROCENTESIS  1983    right   . LUMBAR FUSION  10/2011   . MICRODISCECTOMY LUMBAR  06/2011, 07/2011   . SEPTOPLASTY  10/05/2015   . SKIN BIOPSY  11/2015    Negative       Past Social History     Social History     Social History   . Marital status: Married     Spouse name: N/A   . Number of children:  N/A   . Years of education: N/A     Social History Main Topics   . Smoking status: Former Smoker     Packs/day: 1.00     Years: 41.00     Quit date: 09/26/2011   . Smokeless tobacco: Never Used   . Alcohol use 1.2 oz/week     4 Cans of beer per week   . Drug use: No   . Sexual activity: Yes     Partners: Female     Birth control/ protection: Surgical     Other Topics Concern   . Not on file     Social History Narrative   . No narrative on file       Family History     Family History   Problem Relation Age of Onset   . Heart disease Mother    . Heart disease Father      Reviewed and not contributory to this presentation    Review of Systems:   All other systems were reviewed and are negative except: NONE            Home Medications     Prior to Admission medications    Medication Sig Start Date End Date Taking? Authorizing Provider   amLODIPine (NORVASC) 5 MG tablet Take 10 mg by mouth daily.     11/14/15  Yes [provider]   aspirin 81 MG chewable tablet Chew 81 mg by mouth daily.   Yes [provider]   clobetasol (TEMOVATE) 0.05 % cream 1 APP 2 TIMES A DAY TO AFFECTED AREAS APPLIED TOPICALLY 30 DAYS 05/14/16  Yes [provider]   cyclobenzaprine (FLEXERIL) 10 MG tablet Take 10 mg by mouth daily.      Yes [provider]   DULoxetine (CYMBALTA) 60 MG capsule Take 60 mg by mouth daily.   Yes [provider]   ENBREL SURECLICK 50 MG/ML Solution Auto-injector Inject as directed once a week.Thursdays     12/08/15  Yes [provider]   folic acid (FOLVITE) 400 MCG tablet Take 400 mcg by mouth daily.   Yes [provider]   metFORMIN (GLUCOPHAGE) 500 MG tablet Take 500 mg by mouth daily.   Yes [provider]   traZODone (DESYREL) 50 MG tablet Take 50 mg by mouth nightly as needed.     10/25/15  Yes [provider]   ZETIA 10 MG tablet Take 10 mg by mouth daily.    07/16/12  Yes [provider]   celecoxib (CELEBREX) 200 MG capsule Take 200 mg by mouth 2 (two) times daily.    11/04/15 08/21/16 Yes [provider]   diazepam (VALIUM) 5 MG tablet Take 5 mg by mouth daily.     08/21/16 Yes [provider]   docusate sodium (COLACE) 100 MG capsule Take 1 capsule (100 mg total) by mouth 2 (two) times daily. 08/21/16   Wadie Lessen, MD   enoxaparin (LOVENOX) 30 MG/0.3ML Solution Inject 0.3 mLs (30 mg total) into the skin every 12 (twelve) hours.for 21 days 08/21/16 09/11/16  Wadie Lessen, MD   enoxaparin (LOVENOX) 40 MG/0.4ML Solution Inject 0.4 mLs (40 mg total) into the skin daily.for 14 days 08/10/16 08/24/16  Harriet Masson, MD   FREESTYLE LITE test strip  12/07/15   [provider]   hydroCHLOROthiazide (HYDRODIURIL) 25 MG tablet Take 25 mg by mouth daily.    12/15/15   [provider]  Lancets (FREESTYLE) lancets  12/16/15   [provider]   ondansetron (ZOFRAN-ODT) 4 MG disintegrating tablet Take 1 tablet (4 mg total) by mouth every 8 (eight) hours as needed for Nausea. 08/21/16   Wadie Lessen, MD   oxyCODONE (ROXICODONE) 5 MG immediate release tablet Take 1 tablet (5 mg total) by mouth every 4 (four) hours as needed for Pain (1 - 2 tab every 4 hours as needed for pain). 08/21/16   Wadie Lessen, MD   rosuvastatin  (CRESTOR) 5 MG tablet Take 5 mg by mouth daily.    [provider]       Allergies     Allergies   Allergen Reactions   . Angiotensin Receptor Blockers Other (See Comments)     ARB combined with Celebrex caused Arrhythmia due to high K+   . Ciprofloxacin Swelling and Anaphylaxis     swelling   . Ace Inhibitors Angioedema   . Gabapentin Other (See Comments)     blurred vision, dizziness       Radiology Studies:   No new studies performed at this visit.                                                                        SURGERIES     1 Day Post-Op S/PProcedure(s) (LRB):  ARTHROPLASTY, KNEE, TOTAL (Right) Plan:   Mobility: Out of bed as tolerated with PT/OT   Further surgical plans: No further Orthopaedic plans      RUE: WBAT   LUE:  WBAT   RLE:  WBAT   LLE:  WBAT   Disposition: OK for discharge per Orthopaedics   Home with HPT              Meds:  Scheduled Meds:  Current Facility-Administered Medications   Medication Dose Route Frequency   . acetaminophen  650 mg Oral 4 times per day   . amLODIPine  10 mg Oral Daily   . celecoxib  200 mg Oral Daily   . clobetasol   Topical BID   . cyclobenzaprine  10 mg Oral Daily   . diazePAM  5 mg Oral Daily   . docusate sodium  100 mg Oral BID   . DULoxetine  60 mg Oral Daily   . enoxaparin  30 mg Subcutaneous Q12H   . ezetimibe  10 mg Oral Daily   . hydroCHLOROthiazide  25 mg Oral Daily   . metFORMIN  500 mg Oral Daily   . oxyCODONE  10 mg Oral Q12H SCH   . rosuvastatin  5 mg Oral Daily   . vitamin C  500 mg Oral Daily     Continuous Infusions:  . sodium chloride 100 mL/hr at 08/20/16 1716   . ropivacaine 8 mL/hr at 08/20/16 1042     PRN Meds:.Nursing communication: Adult Hypoglycemia Treatment Algorithm **AND** dextrose **AND** dextrose **AND** glucagon (rDNA), HYDROmorphone, insulin regular, insulin regular, magnesium hydroxide, naloxone, ondansetron **OR** ondansetron, oxyCODONE, oxyCODONE, oxyCODONE, promethazine **OR** promethazine **OR** promethazine, traZODone                 Attending Addendum/Attestation:    I have seen the patient and reviewed the pertinent clinical information, and agree with the discharge summary  as documented by the Resident or Physician Assistant. In addition, I have edited this note where appropriate to reflect my findings and plan as well as to incorporate any new data.      Harriet Masson, M.D.  Production manager and Sports Medicine  Joint Replacement Specialist  [address], [address]  [place], [place] [address]  P: [phone]  F: [phone]

## 2016-08-21 NOTE — Plan of Care (Signed)
Problem: Knee Surgery  Goal: Hemodynamic Stability  Outcome: Progressing  VSS, WNL, afebrile.  Goal: Pain at adequate level as identified by patient  Outcome: Progressing  8/10 to the right knee, given meds, repositions self. Q pump at 24ml/hr  Goal: Stable Neurovascular Status  Outcome: Progressing  Baseline tingling to left toesx3, full sensation to RLE, good cap refill, pulses    Good dorsi/plantar flexion  Goal: Free from Infection  Outcome: Progressing  Uses IS, dressing CDI  Goal: Mobility/activity is maintained at optimum level for patient  Outcome: Progressing    Goal: Patient will maintain normal GI status  Outcome: Progressing  Denies nausea/vomiting, good appetite, active BS    Comments: A/Ox4.    Voids to urinal.    Up in chair with minimal assist and walker.    SCS turned on, ok per resident.

## 2016-08-21 NOTE — Progress Notes (Signed)
TCT Clydie Braun [facility] liaison [id number], lvmm advising of poss [place] tomorrowwith the following needs - HHC PT, DME in place.    Shona Simpson, RN, [facility] Discharge Planner  [facility]  [phone]

## 2016-08-21 NOTE — PT Progress Note (Signed)
Physical Therapy Note     [facility]   Physical Therapy Treatment  Patient:  Kent Jordan MRN#:  [id number]  Unit: [facility] TOWER 8  Bed: N829/F621.30    Discharge Recommendations:   D/C Recommendations: Home with home health PT     DME Recommendations: in place    Assessment:   Treatment Summary/Assessment:   Pt reported he had a good night and feels so much better having gotten dressed and now sitting in the chair.  Pt did well on this POD#1 of TKR.  See below for details of session.  Pt continues to benefit from acute skilled PT.    Treatment Activities: gt training, TKR TE: ap, gs, qs, heel slide, SAQ, SLR    Educated the patient to role of physical therapy, plan of care, goals of therapy and HEP, safety with mobility and ADLs, weight bearing precautions.    Plan:       PT Frequency: 4-5x/wk     Continue plan of care.       Precautions and Contraindications:   Full weight bearing RLE  Falls      Subjective:   Pt reported although it hurts to walk, it feels good    Patient is agreeable to participation in the therapy session. Nursing clears patient for therapy.    Patient's medical condition is appropriate for Physical Therapy intervention at this time.      Cognition: Patient is alert and oriented x 3 and follows directions without difficulty.     Objective:   Observation of Patient/Vital Signs:  Patient is seated in a bedside chair with ace wrap and bandaging R knee, IV access in place.    Postural Assessment:   Unremarkable     Pain:   Scale: 3/10  Location: R knee  Intervention: pain medication, mobility, reposition     Functional Mobility       Rolling: nt       Supine to Sit: nt as pt was up in chair upon this PT's arrival       Sit to Supine: Not tested - pt left in bedside chair       Scooting: ind in chair       Sit to Stand: ind        Stand to Sit: ind       Transfers: ind with rw    Ambulation:       WB Status: full RLE       Assistive Device: rw       Assistance Level:  supervision       Distance: 175'       Gait Pattern: antalgic RLE with occasional sharp pain that would cause him to stop       Stair Management: nt    PMP - Progressive Mobility Protocol   PMP Activity: Step 7 - Walks out of Room  Distance Walked (ft) (Step 6,7):  (175')     Balance:   Good with rw    Endurance:  good    Patient Participation: good    Active  ROM R knee: 10 to 65    Pt left sitting in bedside chair with needs met with RN informed of status as well as outcome of PT session.    Patient left with call bell and phone within reach, SCD, fall mat . All needs met and all questions answered.    Goals:  Goals  Goal Formulation: With patient  Time for Goal Acheivement: 3 visits  Goals: Select goal  Pt Will Go Supine To Sit: independent  Pt Will Perform Sit To Supine: independent  Pt Will Transfer Bed/Chair: with rolling walker, modified independent  Pt Will Ambulate: 101-150 feet, with rolling walker, modified independent  Pt Will Go Up / Down Stairs: 1 flight, modified independent, With rail, With [facility]    Luther Bradley, PT  Pager Number [phone]    Time of Treatment  PT Received On: 08/21/16  Start Time: 1015  Stop Time: 1100  Time Calculation (min): 45 min    Treatment # 1 out of 3 visits

## 2016-08-22 ENCOUNTER — Ambulatory Visit (INDEPENDENT_AMBULATORY_CARE_PROVIDER_SITE_OTHER): Payer: BLUE CROSS/BLUE SHIELD | Admitting: Orthopaedic Surgery

## 2016-08-22 ENCOUNTER — Other Ambulatory Visit: Payer: Self-pay

## 2016-08-22 LAB — GLUCOSE WHOLE BLOOD - POCT
Whole Blood Glucose POCT: 99 mg/dL (ref 70–100)
Whole Blood Glucose POCT: 93 mg/dL (ref 70–100)

## 2016-08-22 LAB — CBC
Absolute NRBC: 0 10*3/uL
Hematocrit: 36.7 % — ABNORMAL LOW (ref 42.0–52.0)
Hgb: 12.5 g/dL — ABNORMAL LOW (ref 13.0–17.0)
MCH: 32.1 pg — ABNORMAL HIGH (ref 28.0–32.0)
MCHC: 34.1 g/dL (ref 32.0–36.0)
MCV: 94.3 fL (ref 80.0–100.0)
MPV: 9.6 fL (ref 9.4–12.3)
Nucleated RBC: 0 /100 WBC (ref 0.0–1.0)
Platelets: 205 10*3/uL (ref 140–400)
RBC: 3.89 10*6/uL — ABNORMAL LOW (ref 4.70–6.00)
RDW: 13 % (ref 12–15)
WBC: 9.72 10*3/uL (ref 3.50–10.80)

## 2016-08-22 MED ORDER — ROPIVACAINE HCL 2 MG/ML IJ SOLN
INTRAMUSCULAR | 0 refills | Status: DC
Start: 2016-08-22 — End: 2016-09-05

## 2016-08-22 MED ORDER — ON-Q PUMP SINGLE FLOW
Status: DC
Start: 2016-08-22 — End: 2016-08-22
  Filled 2016-08-22: qty 550

## 2016-08-22 NOTE — OT Progress Note (Signed)
Occupational Therapy Note    [facility]   Occupational Therapy Treatment     Patient: Kent Jordan    MRN#: [id number]   Unit: [facility] TOWER 8  Bed: V409/W119.14      Discharge Recommendations:   Discharge Recommendation: Home with supervision (and assistance from fmaily for higher level adls)   DME Recommended for Discharge:  (RTS, shower chair)      Assessment:   Tolerated session well. Patient presented sitting up in the chair. Supervision with adls and functional transfers. Completed car transfer in the gym. No acute OT needs. D/c OT  Patient buckled x 1 and c/o clicking sounds in knee-MD aware per patient.    Treatment Activities: Adl re training, functional transfer training    Educated the patient to role of occupational therapy, plan of care, goals of therapy and HEP, safety with mobility and ADLs.    Plan:       Discharge from OT Acute Care Services.       Precautions and Contraindications:   Falls  TKR    Updated Medical Status/Imaging/Labs:  Reviewed    Subjective:I feel better today.   Patient's medical condition is appropriate for Occupational Therapy intervention at this time.  Patient is agreeable to participation in the therapy session.    Pain:   Scale: 4/10  Location: R LE  Intervention: Pre medicated via nsg    Objective:   Patient is in chair with dressings, SCD's and peripheral IV in place.      Cognition  intact    Functional Mobility  Rolling:  nt  Supine to Sit: SBA  Sit to Stand: SBA  Transfers: SBA    PMP - Progressive Mobility Protocol   PMP Activity: Step 7 - Walks out of Room  Distance Walked (ft) (Step 6,7): 200 Feet     Balance  Static Sitting: Good  Dynamic Sitting: fair  Static Standing: fair  Dynamic Standing: fair    Self Care and Home Management  Eating: set up  Grooming: set up  Bathing: min a  UE Dressing: set up  LE Dressing: set up  Toileting: set up    Therapeutic Exercises  With actiivty    Participation: good  Endurance: good    Patient left  with call bell within reach, all needs met, SCDs on, fall mat on, bed alarm off, chair alarm off and all questions answered. RN notified of session outcome and patient response.     Goals:  Time For Goal Achievement: 5 visits  ADL Goals  Patient will dress lower body: Modified Independent  Patient will toilet: Modified Independent  Mobility and Transfer Goals  Pt will perform functional transfers: Modified Independent                             Time of Treatment  OT Received On: 08/22/16  Start Time: 0955  Stop Time: 1020  Time Calculation (min): 25 min    Treatment # 1   Tora Perches OTR/L  Pager [phone]

## 2016-08-22 NOTE — Progress Notes (Signed)
[facility] Inpatient Progress Note  ==================================================================    Date:  08/22/2016  Time: 11:07 AM      Patient Name: Kent Jordan, Kent Jordan  Surgeon: Harriet Masson, MD    Visit Type:  Inpatient Room #   [phone]    Surgical Procedure(s): Procedure(s) (LRB):  ARTHROPLASTY, KNEE, TOTAL (Right)  Post-op Day: 2 Days Post-Op    Continuous Peripheral Nerve Catheter Present?: Yes    Catheter #1  Catheter Location: Adductor Canal  Laterality: Right  Days Catheter In Situ: 2  Infusion Medication and Rate: Ropivacaine 0.2% currently infusing at 8 mL/hr    Opioid Medication(s):  Oxycodone 15 mg x 5 doses in 24 hours  OxyContin 10 mg Q 12 hours    Adjuvant Analgesic Medication(s):       Acetaminophen 650 mg PO q6hrs       Celebrex 200 mg PO q24hrs   Flexeril 10 mg Q day  Cymbalta 60 mg Q day    24 Hour Vital Signs and Pain Score(s): Temp:  [96.5 F (35.8 C)-98.7 F (37.1 C)]   Heart Rate:  [67-87]   Resp Rate:  [16-20]   BP: (135-153)/(70-77)   SpO2:  [94 %-96 %]     Last recorded pain score:  Pain Scale Used: Numeric Scale (0-10)  Pain Score: 8-severe pain       Subjective:  Symptoms Include:  My knee hurts when I first get up - but with more movement the pain subsides.  Getting up and moving has helped overall.    Objective: Pain score at rest:  3    Pain score with activity:  7 - subsides after initial movement.                     Motor block:  No                     Sensory block:  Diminished                     Catheter site:   Clean, dry and intact.    Assessment:   Catheter is functioning as expected.    Plan:       - Continue local anesthetic infusion at current rate.       - Patient will benefit from continued nerve catheter infusion, please discharge patient with catheter infusing at current rate.   Discharge - home care instructions reviewed with pt.  New pump ordered for planned discharge home later today.    Would patient receive block  again?  Yes    Level of Care: 2    ------------------------------------------------------------------------------------------------------------------  Melene Plan  Department of Anesthesiology  [facility]  [facility]    [facility] General Phone (All hours): [phone]  [facility] Pain Nurse (8am - 4pm): [phone]

## 2016-08-22 NOTE — Progress Notes (Signed)
[facility] Inpatient Progress Note  ==================================================================    Date:  08/22/2016  Time: 1:05 PM      Patient Name: Kent Jordan, Kent Jordan  Surgeon: Harriet Masson, MD    Visit Type:  Inpatient Room #   [phone]    Surgical Procedure(s): Procedure(s) (LRB):  ARTHROPLASTY, KNEE, TOTAL (Right)  Post-op Day: 2 Days Post-Op    Catheter #1  Catheter Location: Adductor Canal  Laterality: Right  Days Catheter In Situ: 2  Infusion Medication and Rate: Ropivacaine 0.2% currently infusing at 8 mL/hr    Opioid Medication(s):  Oxycodone 15 mg x 5 doses in 24 hours  OxyContin 10 mg Q 12 hours    Adjuvant Analgesic Medication(s):       Acetaminophen 650 mg PO q6hrs       Celebrex 200 mg PO q24hrs   Flexeril 10 mg Q day  Cymbalta 60 mg Q day    24 Hour Vital Signs and Pain Score(s): Temp:  [35.8 C (96.4 F)-37.1 C (98.7 F)]   Heart Rate:  [67-87]   Resp Rate:  [16-20]   BP: (135-157)/(70-77)   SpO2:  [93 %-96 %]     Last recorded pain score:  Pain Scale Used: Numeric Scale (0-10)  Pain Score: 8-severe pain     Subjective:  Symptoms Include:  My knee hurts when I first get up - but with more movement the pain subsides.  Getting up and moving has helped overall.    Objective: Pain score at rest:  3    Pain score with activity:  7                     Motor block:  No                     Sensory block:  Diminished                     Catheter site:   Clean, dry and intact.    Assessment:   Catheter is functioning as expected.    Plan:       - Continue local anesthetic infusion at current rate.       - Patient will benefit from continued nerve catheter infusion, please discharge patient with catheter infusing at current rate.     Level of Care: 2    ------------------------------------------------------------------------------------------------------------------  Avanell Shackleton  Department of Anesthesiology  [facility]  [facility]    [facility] General Phone (All hours): [phone]  [facility] Pain Nurse (8am - 4pm): [phone]

## 2016-08-22 NOTE — Progress Notes (Signed)
Attending Addendum/Attestation:    I have seen the patient and reviewed the pertinent clinical information, and agree with the physical exam, patient history, and planning as documented by the Resident or Physician Assistant. In addition, I have edited this note where appropriate to reflect my findings and plan as well as to incorporate any new data.    -POD#2 s/p R TKA  -AVSS, pain controlled  -PT/OT  -d/c to home today with HHPT  -Pt already has Lovenox Rx for home use (40mg  sq qday for 2 wks)  -f/u with me as scheduled in 2 wks, sooner if any issues    Harriet Masson, M.D.  [facility]  Joint Replacement Specialist  [address]  [place], [place] [address]  P: [phone]  F: [phone]    Orthopedic Daily Progress Note    08/22/2016   5:32 AM    Kent Jordan is a 65 y.o. male       Subjective: Pain better controlled today.  Worked with PT yesterday and felt it went well.  No new complaints. Patient Denies nausea, vomiting, or fevers.     Physical Exam:  Vitals:    08/22/16 0411   BP: 144/70   Pulse: 75   Resp: 20   Temp: (!) 96.7 F (35.9 C)   SpO2: 94%        Intake/Output Summary (Last 24 hours) at 08/22/16 0532  Last data filed at 08/22/16 0411   Gross per 24 hour   Intake                0 ml   Output             1001 ml   Net            -1001 ml     Results     Procedure Component Value Units Date/Time    CBC [[id number]] Collected:  08/22/16 0427    Specimen:  Blood from Blood Updated:  08/22/16 0427    Glucose Whole Blood - POCT [[id number]]  (Abnormal) Collected:  08/21/16 2201     Updated:  08/21/16 2204     POCT - Glucose Whole blood 117 (H) mg/dL     Glucose Whole Blood - POCT [[id number]]  (Abnormal) Collected:  08/21/16 1649     Updated:  08/21/16 1658     POCT - Glucose Whole blood 108 (H) mg/dL     Glucose Whole Blood - POCT [[id number]]  (Abnormal) Collected:  08/21/16 1109     Updated:  08/21/16 1113     POCT - Glucose Whole blood 103 (H) mg/dL      Glucose Whole Blood - POCT [[id number]]  (Abnormal) Collected:  08/21/16 0710     Updated:  08/21/16 0714     POCT - Glucose Whole blood 120 (H) mg/dL           Right Lower Extremity:  Dressing c/d/i                                                                                                                     +  PF/DF/EHL                                                                                                                       SILT SP/DP/T                                                                                                                     2+ DP/PT  Cap refill <2 secs          Assessment: s/p Procedure(s) (LRB):  ARTHROPLASTY, KNEE, TOTAL (Right)    Plan:   Mobility: Out of bed as tolerated with PT/OT   Pain control: Continue to wean/titrate to appropriate oral regimen   DVT Prophylaxis (per Ortho service Protocol): lovenox   Foley catheter status: Does not have Foley   Further surgical plans: No further Orthopaedic plans      RUE: WBAT   LUE:  WBAT   RLE:  WBAT   LLE:  WBAT   Disposition: Planning for D/C home today    Wadie Lessen

## 2016-08-22 NOTE — Discharge Summary (Signed)
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 male who was admitted on 08/20/2016 for PR ARTHRP KNE CONDYLE&PLATU MEDIAL&LAT CMPRTS [27447] (ARTHROPLASTY, KNEE, TOTAL).  He recovered routinely, without complications.      By 08/22/2016, the patient was deemed hemodynamically stable, and neurovascularly intact, and ready for discharge to home with the following instructions:    He may weightbear as tolerated with the use of a walker for support.  Keep incision covered and dry with dressings as instructed.  Home PT/OT as ordered for range of motion, rehabilitative excercises and functional restoration.  May resume routine home/Diabetic diet.  Medications as per the discharge medication list.  Follow up with Dr. Josiah Lobo in 2 weeks (as already scheduled), or sooner if there are any questions, signs of infection (worsening redness, drainage, fevers, chills), or any other concerns.        Dr. Josiah Lobo Discharge Summary  Total Knee Replacement  Home Health Discharge Information:  . Name of Home Health Agency: [facility] [phone])  . Physical therapy and Home Health provide in-home care about 3 times per week for the first 2 weeks following surgery.   1st Post-Operative Appointment:   . Please make sure you have a 1st post-operative appointment scheduled for approximately 2 weeks after surgery. If you do not have one scheduled, please call our office to arrange this [phone]). You will have your staples removed at this appointment.  Dressing changes:  . Dressing changes begin on post-op day 2 and can  continue to be changed every day, or every other once the incision is dry.   . Wash hands first, then apply a thin layer of antibiotic ointment (Neosporin or Bacitracin) to the incision, and use gauze to cover the incision and a gentle ACE wrap over the knee to hold it on.   Showering:   . You should keep the incision dry until the 1st post-op appointment. Either use a waterproof bandage when taking a shower, or cover the knee with a plastic bag to prevent getting wet. The incision can get wet 24 hours after staples are removed, but avoid submerging the knee under water until 3 weeks after surgery.  Anticoagulation:   . A blood-thinner is prescribed in order to reduce the risk of blood clots. Usually, a prescription for Lovenox is given at the pre-op appointment. Please take this prescription to your pharmacy prior to surgery to allow time for the prior-authorization process. After discharge from the hospital, inject once daily for 14 days. After the Lovenox prescription is completed, please take 325mg  Aspirin once daily for an additional 4 weeks. *Sometimes, based on your medical history, a different anticoagulant medication may be used.  Icing:   . Ice your knee with an ice bag, or bag of frozen peas/corn, as tolerated. If you have arranged to have an ice machine after your knee surgery, use as instructed. Be sure not to sit around for too long "tethered" to the machine - it is important to get up at least once an hour during the day.  On-Q Pump:   . Usually is stopped on the third day after surgery.  Follow instructions for care/removal.  If you have any questions regarding your On-Q Pump or have questions regarding the removal of the catheter, please call the 24 hour Nurse Support Hotline at [phone]).  Compression stockings:  . Compression stockings (TED stockings) are provided in the hospital and need to be worn continuously until your 1st post-op appointment. You may remove them for showering, or if  desired for sleep at night, but should be worn all day. You will need help taking these on and off.  Outpatient Physical Therapy:  . An order for Outpatient physical therapy will be given at your 1st post-op appointment. Please schedule the Outpatient PT appointment in a timely fashion so that there is no significant gap between stopping Home PT and beginning Outpatient PT, since it is very important to not allow the knee to become stiff. Working very hard after surgery on regaining your range of motion in particular, is essential to a good outcome after knee replacement.  This can be painful and difficult, but it must be done, and cannot be delayed.  Call our office:  . Incision concerns:    o The most common patient concern after surgery is a fear of infection.  It is true that signs of infection are redness, warmth, and swelling.  But early after surgery, normal healing incisions are red, warm, and swollen, with an expected gradual improvement with time.  What is concerning is if there is steadily increasing redness, warmth, and swelling, pus-like drainage, fevers (greater than 101.0), or increasing pain.  If you have any concern about infection, please call our office for evaluation.  . Urinary symptoms:  o Frequent urination, burning with urination, "cloudy" urine, or foul odor can be signs of a urinary infection.  Call our office if there are concerns, and we will likely order a urine test to check.  . Signs of possible "blood clots" or deep vein thrombosis (DVT) in the leg:  o The risk of blood clots in the leg is elevated for about 6 weeks after joint replacement.  Use of your blood thinning medication, TED stockings, ankle pumps, regular walking (getting up at least once an hour), and staying hydrated all help to reduce this risk.  However, if you notice signs of pain or firm swelling in the calf, contact us for evaluation.  If you have chest pain, or shortness of breath associated with these symptoms, this  can be a sign of a clot that has traveled to the lungs, which can be very dangerous, so we will recommend that you immediately go to the ER for evaluation.  . Fever: Slightly elevated temperature is common in the first week after surgery, and should resolve with mobilization, and deep breathing exercises.  If, however, you notice that fevers are increasing, or exceeds 101.0, please call our office for evaluation.  The most common causes of fever after surgery is actually not a knee infection, but often related to the urine or the lungs.   Infection Risk:  . Prosthetic joints can become infected if there is infection in other parts of your body. If you develop a deep cut or puncture wound, clean it well, put a sterile bandage on it, and notify your doctor. Animal bites should always be reported to your doctor to get treatment. Pedicures also increase your risk of infection, and should be avoided for at least 6 months after your surgery. The closer the injury is to your joint, the greater the concern. Occasionally, antibiotics may be needed.   Dentist:   .  Dr. Josiah Lobo recommends lifetime antibiotics prior to dental procedures. Please wait a minimum of 3 months after your joint replacement surgery prior to elective dental procedures, including cleaning. Please tell your dentist that you have a prosthesis prior to your dental appointment so that they can give you a prescription for an antibiotic.   Driving:  . It is usually okay to drive when you have transitioned from the walker to a cane.  You need to have the strength and reaction time in your leg for an emergency stop. This is typically 2-3 weeks after left leg surgery, and 3-6 weeks after right leg surgery.  Please practice in an empty parking lot prior to driving. DO NOT DRIVE WHILE TAKING NARCOTIC PAIN MEDICATION.   Flying/Roadtrips:  . In order to reduce the risk of developing blood clots, try to avoid flying/long roadtrips for 6 weeks after surgery.  If you do  travel, using your TED stockings, taking Aspirin, staying hydrated, ankle pumps, and getting up to walk around at least once an hour is recommended.  Post-op medications:   . Please reference your discharge medication list for information regarding the medications you will be taking after discharge.

## 2016-08-22 NOTE — Plan of Care (Signed)
Problem: Knee Surgery  Goal: Pain at adequate level as identified by patient  Outcome: Progressing   08/22/16 0139   Goal/Interventions addressed this shift   Pain at adequate level as identified by patient  Identify patient comfort function goal;Administer analgesics as prescribed to achieve pain goal;Reposition patient every 2 hours and PRN unless able to self-reposition     Patient experiencing more pain today and benefited from repositioning/around the clock PRN pain medication   Goal: Stable Neurovascular Status  Outcome: Adequate for Discharge   08/22/16 0139   Goal/Interventions addressed this shift   Stable neurovascular status  Assess and document plantar/dorsiflexion every 4 hours;Monitor/assess neurovascular status (pulses, capillary refill, pain, paresthesia, presence of edema);Monitor/assess for return of sensation after nerve block therapy if indicated       Problem: Moderate/High Fall Risk Score >5  Goal: Patient will remain free of falls  Outcome: Progressing   08/22/16 0139   OTHER   Moderate Risk (6-13) MOD-Initiate Yellow "Fall Risk" magnet communication tool

## 2016-08-22 NOTE — PT Progress Note (Signed)
Physical Therapy Note   [facility]   Physical Therapy Treatment  Patient:  Kent Jordan MRN#:  [id number]  Unit: [facility] TOWER 8  Bed: I696/E952.84    Discharge Recommendations:   D/C Recommendations: Home with home health PT     DME Recommendations: in place    Assessment:   Treatment Summary/Assessment:   Pt has met goals and is safe for [place] home with HHPT.  See below for details of session.    Treatment Activities: gt training, TE    Educated the patient to role of physical therapy, plan of care, goals of therapy and HEP, safety with mobility and ADLs, discharge instructions, home safety.    Plan:       PT Frequency:  ([facility])     Discharge from PT Acute Care Services.       Precautions and Contraindications:   Full weight bearing RLE  Falls      Subjective:   Pt reported he is motivated to work hard to get the flexion back    Patient is agreeable to participation in the therapy session. Nursing clears patient for therapy.    Patient's medical condition is appropriate for Physical Therapy intervention at this time.      Cognition: Patient is alert and oriented x 3 and follows directions without difficulty.     Objective:   Observation of Patient/Vital Signs:  Patient is seated in a bedside chair with ace wrap R knee, IV access in place.    Postural Assessment:   Unremarkable     Pain:   Scale: 6/10  Location: R knee  Intervention: pain medication, mobility, reposition       Functional Mobility       Rolling: nt       Supine to Sit: ind       Sit to Supine: ind       Scooting: ind       Sit to Stand: ind       Stand to Sit: ind       Transfers: ind with rw    Ambulation:       WB Status: full RLE       Assistive Device: rw       Assistance Level: none       Distance: 200'       Gait Pattern: antalgic RLE       Stair Management: ind with SPC and railing    PMP - Progressive Mobility Protocol   PMP Activity: Step 7 - Walks out of Room  Distance Walked (ft) (Step 6,7): 200 Feet      Balance:   Good with rw    Endurance:  good    Patient Participation: good    Pt left sitting in bedside chair with needs met with RN informed of status as well as outcome of PT session.    Patient left with call bell and phone within reach, SCD, fall mat . All needs met and all questions answered.    Goals:  Goals  Goal Formulation: With patient  Time for Goal Acheivement: 3 visits  Goals: Select goal  Pt Will Go Supine To Sit: independent  Pt Will Perform Sit To Supine: independent  Pt Will Transfer Bed/Chair: with rolling walker, modified independent  Pt Will Ambulate: 101-150 feet, with rolling walker, modified independent  Pt Will Go Up / Down Stairs: 1 flight, modified independent, With rail, With [facility]  Luther Bradley, PT  Pager Number [phone]    Time of Treatment  PT Received On: 08/22/16  Start Time: 1015  Stop Time: 1045  Time Calculation (min): 30 min    Treatment # 2 out of 3 visits

## 2016-08-22 NOTE — Plan of Care (Signed)
Problem: Knee Surgery  Goal: Hemodynamic Stability  Outcome: Completed Date Met: 08/22/16  Vitals stable.   Goal: Pain at adequate level as identified by patient  Outcome: Completed Date Met: 08/22/16  Patient verbalized moderate to severe pain. Oxycodone administered. New Q pump ball applied. No side effects from Q pump.  Goal: Stable Neurovascular Status  Outcome: Completed Date Met: 08/22/16  Neurovascular status intact.   Goal: Free from Infection  Outcome: Completed Date Met: 08/22/16  No signs of infection.  Goal: Mobility/activity is maintained at optimum level for patient  Outcome: Completed Date Met: 08/22/16  Patient tolerated ambulation with minimal assist.   Goal: Patient will maintain normal GI status  Outcome: Completed Date Met: 08/22/16  No nausea and vomiting.  Goal: Address patient self-management plan  Outcome: Completed Date Met: 08/22/16  Discharge instructions given. Patient discharged to home.   Goal: Patient/Patient Care Companion demonstrates understanding of disease process, treatment plan, medications, and discharge plan  Outcome: Completed Date Met: 08/22/16      Problem: Psychosocial and Spiritual Needs  Goal: Demonstrates ability to cope with hospitalization/illness  Outcome: Completed Date Met: 08/22/16      Problem: Compromised Tissue integrity  Goal: Damaged tissue is healing and protected  Outcome: Completed Date Met: 08/22/16    Goal: Nutritional status is improving  Outcome: Completed Date Met: 08/22/16      Problem: Moderate/High Fall Risk Score >5  Goal: Patient will remain free of falls  Outcome: Completed Date Met: 08/22/16  Patient free of fall.

## 2016-08-22 NOTE — Plan of Care (Deleted)
Problem: Knee Surgery  Goal: Patient will maintain normal GI status  Outcome: Not Progressing   08/22/16 0134   Goal/Interventions addressed this shift   Patient will maintain normal GI status  Assess for nausea and/or vomiting. Provide pharmacological and/or non-pharmacological support as needed.;Assess and document if patient is passing gas, each shift.;Assess for normal bowel sounds      08/22/16 0134   Goal/Interventions addressed this shift   Patient will maintain normal GI status  Assess for nausea and/or vomiting. Provide pharmacological and/or non-pharmacological support as needed.;Assess and document if patient is passing gas, each shift.;Assess for normal bowel sounds       Problem: Compromised Tissue integrity  Goal: Nutritional status is improving  Outcome: Not Progressing   08/22/16 0134   Goal/Interventions addressed this shift   Nutritional status is improving Assist patient with eating;Allow adequate time for meals;Encourage patient to take dietary supplement(s) as ordered     Patient had poor appetite today    Problem: Moderate/High Fall Risk Score >5  Goal: Patient will remain free of falls  Outcome: Progressing   08/22/16 0134   OTHER   Moderate Risk (6-13) MOD-Initiate Yellow "Fall Risk" magnet communication tool       Comments: Patient had increased generalized swelling today but Neuro signs intact and VSS

## 2016-08-24 ENCOUNTER — Inpatient Hospital Stay: Payer: BLUE CROSS/BLUE SHIELD

## 2016-08-27 ENCOUNTER — Inpatient Hospital Stay: Payer: BLUE CROSS/BLUE SHIELD | Admitting: Physical Therapist

## 2016-08-29 ENCOUNTER — Ambulatory Visit (INDEPENDENT_AMBULATORY_CARE_PROVIDER_SITE_OTHER): Payer: BLUE CROSS/BLUE SHIELD | Admitting: Orthopaedic Surgery

## 2016-08-30 ENCOUNTER — Inpatient Hospital Stay: Payer: BLUE CROSS/BLUE SHIELD

## 2016-09-03 ENCOUNTER — Inpatient Hospital Stay: Payer: BLUE CROSS/BLUE SHIELD | Admitting: Physical Therapist

## 2016-09-05 ENCOUNTER — Ambulatory Visit
Admission: RE | Admit: 2016-09-05 | Discharge: 2016-09-05 | Disposition: A | Discharge status: Home / Self Care | Payer: BLUE CROSS/BLUE SHIELD | Source: Ambulatory Visit | Attending: Orthopaedic Surgery | Admitting: Orthopaedic Surgery

## 2016-09-05 ENCOUNTER — Ambulatory Visit (INDEPENDENT_AMBULATORY_CARE_PROVIDER_SITE_OTHER): Payer: BLUE CROSS/BLUE SHIELD | Admitting: Orthopaedic Surgery

## 2016-09-05 ENCOUNTER — Encounter (INDEPENDENT_AMBULATORY_CARE_PROVIDER_SITE_OTHER): Payer: Self-pay | Admitting: Orthopaedic Surgery

## 2016-09-05 VITALS — BP 147/83 | HR 85 | Temp 97.8°F | Wt 185.0 lb

## 2016-09-05 DIAGNOSIS — R300 Dysuria: Secondary | ICD-10-CM | POA: Insufficient documentation

## 2016-09-05 DIAGNOSIS — Z471 Aftercare following joint replacement surgery: Secondary | ICD-10-CM

## 2016-09-05 DIAGNOSIS — Z96651 Presence of right artificial knee joint: Secondary | ICD-10-CM

## 2016-09-05 DIAGNOSIS — N390 Urinary tract infection, site not specified: Secondary | ICD-10-CM | POA: Insufficient documentation

## 2016-09-05 DIAGNOSIS — R3989 Other symptoms and signs involving the genitourinary system: Secondary | ICD-10-CM

## 2016-09-05 LAB — URINALYSIS REFLEX TO MICROSCOPIC EXAM - REFLEX TO CULTURE
Bilirubin, UA: NEGATIVE
Blood, UA: NEGATIVE
Glucose, UA: NEGATIVE
Ketones UA: NEGATIVE
Leukocyte Esterase, UA: NEGATIVE
Nitrite, UA: NEGATIVE
Protein, UR: NEGATIVE
Specific Gravity UA: 1.02 (ref 1.001–1.035)
Urine pH: 6 (ref 5.0–8.0)
Urobilinogen, UA: 1

## 2016-09-05 MED ORDER — SULFAMETHOXAZOLE-TRIMETHOPRIM 800-160 MG PO TABS
1.0000 | ORAL_TABLET | Freq: Two times a day (BID) | ORAL | 0 refills | Status: AC
Start: 2016-09-05 — End: 2016-09-15

## 2016-09-05 MED ORDER — OXYCODONE HCL 5 MG PO TABS
5.0000 mg | ORAL_TABLET | ORAL | 0 refills | Status: DC | PRN
Start: 2016-09-05 — End: 2016-10-24

## 2016-09-05 NOTE — Progress Notes (Signed)
VS: BP 147/83   Pulse 85   Temp 97.8 F (36.6 C) (Oral)   Wt 83.9 kg (185 lb)   SpO2 98%   BMI 26.17 kg/m     HPI: The patient returns for their first postoperative visit, s/p right total knee replacement, performed approximately 2 weeks ago on August 20, 2016.  He reports he is making good progress with therapy, currently ambulating  with a cane at home, and a walker when out of the house. He denies any purulent drainage, or other signs of infection.  He has, however, had some dysuria, with a foul odor to the urine, and some low-grade temperatures over the last week.  It is notable that he was straight cathed at the hospital for urinary retention.  Pain is moderate and gradually improving.    PE:   Ambulating with a walker with a minimally antalgic gait.  Accompanied by his supportive wife  Incision healing well.  No erythema, or drainage.  Staples intact, removed today during visit, and steri strips applied.  ROM: 0 degrees of extension to 100 degrees of flexion  Calves soft/NT, negative Homan's test and Thompson's sign  Pedal pulses palpable, symmetric, with <3 sec capillary refill    A/P: Approximately 2 weeks s/p right total knee replacement, making good progress for this stage of recovery.  He may shower and get the incision wet tomorrow, but should avoid submersion under water for another week.  A prescription was given to begin outpatient physical therapy to continue working diligently on ROM and strengthening, gait training/stability, and functional restoration.    I am going to send him for a urinalysis with culture considering his dysuria with foul-smelling urine, and start him empirically on Bactrim DS twice a day for 10 days due to the strong suspicion of a urinary tract infection.    I will see him back for followup in 6 weeks, or sooner if he has any problems in the meantime.

## 2016-09-06 ENCOUNTER — Inpatient Hospital Stay: Payer: BLUE CROSS/BLUE SHIELD

## 2016-09-10 ENCOUNTER — Inpatient Hospital Stay: Payer: BLUE CROSS/BLUE SHIELD | Attending: Orthopaedic Surgery | Admitting: Physical Therapist

## 2016-09-10 DIAGNOSIS — M25561 Pain in right knee: Secondary | ICD-10-CM | POA: Insufficient documentation

## 2016-09-10 NOTE — PT/OT Therapy Note (Signed)
INITIAL EVALUATION (Knee)      Name: Kent Jordan Age: 65 y.o. Occupation: retired SOC: 09/10/16  Referring Physician: Harriet Masson, MD MD recheck: late January  DOS: 08/20/16  DOI:  08/20/16  Diagnosis (Treating/Medical): The encounter diagnosis was Acute pain of right knee.   # of Authorized Visits: 18 Visit # 1 today     SUBJECTIVE: s/p R TKR Nov. 27, 2017.  Arthroscopic surgery in July to repair meniscus.  I saw Dr. Josiah Lobo.  I am tired. I am not allowed to take my arthritis medicine because it creates a higher risk for infection.  I can start the meds again in 2 weeks.  MD said I have 100 degrees of bending.  I had home health for a week.  I have an elliptical machine.  I am not sleeping well at all.  I can't sleep on my back.  I sleep with a pillow between my knees.  I take Trazadone and I can sleep.  MD told me to wear compression stocking for 1 more week.  My daughter is getting married in May, I would like to walk her down the aisle without a cane.  [place] trip in February.  Neuropathy L foot, no feeling in 4 toes- lateral. L knee cap pain.      Mechanism of Injury:  surgery  Patient's reason for seeking PT /Patient's Goals:   see assessment    Past Medical History:   Past Medical History:   Diagnosis Date   . Arrhythmia     when on ARB   . Arthritis     psoriatic and osteoarthritis    . Back pain    . Blood disorder 11/2014    Hemochromatosis   . Diabetes mellitus 2016    started metformin 05/2016 d/t A1C 7.1 - now checking BS 2x/day - average 70-110 in the last week - A1C on 06/29/16 = 6.3 , has also lost 16 lbs since 05/2016   . Difficulty in walking(719.7)     Uses cane to ambulate   . Elevated liver function tests     had abd u/s 07/05/16    . Failed back syndrome    . Fatty liver    . Hemochromatosis     phlebotomy every 3 months - last done 02/2016   . High cholesterol    . Hypertensive disorder 06/2016    norvasc recently increased to 10 mg daily - now controlled on meds - most recent BP 117/73   .  Low back pain    . Lung nodule 2012    benign - was monitored x  3 yrs by Dr. Bernita Raisin [facility]) - signed off from pulmonology - no further f/u required since 2015   . Neuropathy     Tingling left foot, numbness left toes   . Psoriasis    . Psoriatic arthritis     tx w/ enbrel   . S/P insertion of spinal cord stimulator 2015   . Sleep apnea     mild - does not have CPAP - was unable to tolerate, less snoring since turboplasty     Medications: Trazadone  Other Treatment/Prior Therapy: Yes at this clinic   Prior Hospitalization:  n/a  Hand Dominance:  right  Involved Side:   right  Tests:      n/a  Outcome Measure:         LEFS R 24% Pain Score: 67%   Rate Satisfaction with Current Function: 3/10  PLOF:  History of pain s/p R meniscus surgery  Living Environment:  multi-level # of Steps: 12      Dwelling Entrance:  stairs present Are handrails present? No  Patient lives with:  wife    OBJECTIVE:    Vitals:      error on machine    Observation/Posture/Gait/Integumentary:  Observation of posture: Decreased R TKE and weight acceptance , R iliac crest slightly higher , shaking noted L LE with weight acceptance.      Ambulation: decreased R pushoff, decreased weight acceptance with reliance of WB on L SPC, inefficient weight acceptance of L, decreased R pelvic AE    Knee Tracking: L knee tracks laterally to 2nd toe    Shoe Wear: wearing appropriate tennis shoes    Integumentary: surgical incision healing with no s/s infection   Palpation:    Patellar Mobility:hypo superior and inferior     ST restriction R iliacus, quads, IT band gastroc/ soleus, peroneals    Range of Motion: (degrees)      Right  AROM   Right PROM Knee   Left AROM   Left PROM   120  Flexion 100 degrees    0  Extension -5    (blank fields were intentionally left blank)    Hip AROM:   L IR 5, ER 15 . HISL: early movement of inominate   R IE 4, ER 15.  HISL: Early movement of inominate.      Ankle AROM:   R DF: 5, PF 30  L DF 5, PF 30    Flexibility:  R SLR 60  L  SLR 60     Strength:    R LE Strength  MMT = /5    L   3+ tight in quad  Hip Flexion 3+     Hip Extension     Hip Abduction     Hip Adduction    3+ Knee Flexion    3+ Knee Extension 5   4 Ankle Dorsiflexion 4-   4 Ankle Plantarflexion 5        (blank fields were intentionally left blank)    Functional Strength:   Sit to Stand: UE Assist  Squat:  Unable  Heel Raises:  Bilateral: unable    Unilateral: R:   L:    Balance:  SLS   R: 0 seconds  L: 2 seconds    Sensation to Light touch: intact except medially R knee    Treatment Initial Visit:  Evaluation  Patient Education as part of TE on diagnosis, prognosis, POC  TA: Reviewed SL sleep positioning ABA    Barriers to rehabilitation: None  Is patient aware of diagnosis: Yes    Hassell Halim, PT, MSPT  [place] [phone]  09/10/16    Plan of Care IPTC Medicare Provider #: [phone]                Patient Name: Kent Jordan  MRN: [id number]  DOI:  08/20/16  DOS: 08/20/16    SOC:09/10/2016    Diagnosis:     ICD-10-CM    1. Acute pain of right knee M25.561        ASSESSMENT: the patient is a 65 y.o. male s/p R TKR  who requires Physical Therapy for the following:  Impairments:    Pain that limits and interferes with functional ability.   Impaired biomechanical alignment.  Decreased range of motion of the knee  Decreased strength of hip and knee  Decreased  functional stability of the hip and core  Decreased joint mobility of the patellofemoral joint  Decreased soft tissue mobility of hip and thigh  Impaired gait mechanics.    Decreased/impaired motor control during squatting      Barriers to Rehabilitations/Comorbidities/personal  factors:  Past surgical history R meniscus surgery earlier this year  Comorbidities stimulator, arthritis, failed back syndrome, neuropathy     Pain located: R LE    Clinical presentation: evolving with activity level    Functional Limitations (PLOF): pain/ difficulty with housework, car transfers, donning shoes, walking, stair mobility prolonged  standing, rolling over in bed.      Plan Of Care: Body Mechanics Education, NMR, Proprioceptive Activites, Instruction in HEP, Therapeutic Exercise, Therapeutic Activities, Balance/Gait training and Soft Tissue/Joint Mobilization thoracic/ lumbar spine, scapulae, bilateral LE's, lumbopelvic girdles to address regionally interdependent dysfunctions    Frequency/Duration: 2 times a week for 12 sessions. Certification Status Ends: 12/10/16    Goals:  Date (Body Area, Impairment Goal, Functional   Activity, Target Performance) Time Frame Status Date/  Initial   09/10/16   Patient will improve score on LEFS to >= 70% to indicate improved function  12 sessions Initial Eval       Pt will demo hip hinge with neutral trunk and T-rex arms to keep load close to lift 10 lbs from floor to chest without c/o pain to increase safety with lifting for ADL's    12 sessions Initial Eval      Patient will improve TKE to 0 degrees to  report ability to amb with symmetrical gait pattern without AD x 30 mins at a time to ease ADL performance daily  12 sessions Initial Eval       Patient will improve R knee flexion to >=120 degrees to  go up and down 12, 7 inch stairs with 1 rail and RP without c/o knee pain to ease stair navigation at home and in community daily  12  sessions Initial Eval      Signature: Hassell Halim, PT, MSPT  [place] #[id number] Date: 09/10/16    Signature: Harriet Masson, MD _______________________  Date:  ________________     Patient Name: DAEQUAN KOZMA  MRN: [id number]  Total Treatment (Billable) Time:  45 min  Total Timed Minutes: 25 min

## 2016-09-12 DIAGNOSIS — M25561 Pain in right knee: Secondary | ICD-10-CM | POA: Insufficient documentation

## 2016-09-13 ENCOUNTER — Inpatient Hospital Stay: Payer: BLUE CROSS/BLUE SHIELD | Attending: Orthopaedic Surgery

## 2016-09-13 DIAGNOSIS — M25561 Pain in right knee: Secondary | ICD-10-CM | POA: Insufficient documentation

## 2016-09-13 NOTE — PT/OT Therapy Note (Signed)
DAILY NOTE   09/13/2016        Total Treatment (billable)Time: 45   Total Timed Minutes:  45 Visit Number:  2/12 until 3/19      Payor: [employer] / Plan: [employer] / Product Type: *No Product type* /    # of Authorized Visits: 18 Visit #: 2      Diagnosis (Treating/Medical):     ICD-10-CM    1. Acute pain of right knee M25.561            Subjective:  Eeshan states "I was here on Monday and I got some exercises, I may have overdone them.  I have been having some back pain and my leg is pretty sore.      Objective:   Treatment:  Therapeutic Exercise: to improve: Flexibility/ROM, Stabilization and Strength   SAQ 10 repetitions  Standing terminal knee extension 2 sets of 10 repetitions     NMR:    End range isometric hold with verbal and tactile cues as well as manual facilitation to improve end range motor control during terminal knee extension  Combination of isotonics at with verbal and tactile cues to improve end range stability and motor control during terminal knee extension.     Therapeutic Activities:  N/A          Manual Therapy:   Manual mobilization to the superficial fascia of anterior and posterior knee  Scar mobilization to reduce adhesions and improve skin mobility   Bony contour clearing to patella  Soft tissue mobilization to Quads, hamstrings, gastroc  Trigger point manual release with long duration pressure to quad.   Patellar mobilization in superior, inferior, medial and lateral direction with grade III force          Initial Evaluation Reference and/orCurrent Measurements (ROM, Strength, Girth, Outcomes, etc.):      Flexion: 94 degrees  ROM: missing 4 to straight.     Modalities: None  Therapy Rationale: Other: N/A       Assessment (response to treatment):   Patient making good progress with quad initiation and swelling.  Continues to demonstrate poor quad initiation with pain but improved after MT.  Less antalgia with gait after treatment.     Progress towards functional goals:   Goals:  Date (Body  Area, Impairment Goal, Functional   Activity, Target Performance) Time Frame Status Date/  Initial   09/10/16   Patient will improve score on LEFS to >= 70% to indicate improved function  12 sessions Initial Eval       Pt will demo hip hinge with neutral trunk and T-rex arms to keep load close to lift 10 lbs from floor to chest without c/o pain to increase safety with lifting for ADL's    12 sessions Initial Eval      Patient will improve TKE to 0 degrees to  report ability to amb with symmetrical gait pattern without AD x 30 mins at a time to ease ADL performance daily  12 sessions In Progress - 4 to straight       Patient will improve R knee flexion to >=120 degrees to  go up and down 12, 7 inch stairs with 1 rail and RP without c/o knee pain to ease stair navigation at home and in community daily  12  sessions Initial Eval        Patient requires continued skilled care to: Improve swelling, knee ROM    Plan:  Continue with Plan of Care  Deborra Medina, PT, DPT, ATC  [place] # [phone]   09/13/2016

## 2016-09-14 ENCOUNTER — Ambulatory Visit (INDEPENDENT_AMBULATORY_CARE_PROVIDER_SITE_OTHER): Payer: Self-pay | Admitting: Nurse Practitioner

## 2016-09-18 ENCOUNTER — Inpatient Hospital Stay
Payer: BLUE CROSS/BLUE SHIELD | Attending: Orthopaedic Surgery | Admitting: Rehabilitative and Restorative Service Providers"

## 2016-09-18 DIAGNOSIS — M25561 Pain in right knee: Secondary | ICD-10-CM

## 2016-09-18 NOTE — PT/OT Therapy Note (Signed)
DAILY NOTE   09/18/2016        Total Treatment (billable)Time: 45   Total Timed Minutes:  45 Visit Number:  3/12 until 3/19      Payor: [employer] / Plan: [employer] / Product Type: *No Product type* /    # of Authorized Visits: 18 Visit #: 2      Diagnosis (Treating/Medical):     ICD-10-CM    1. Acute pain of right knee M25.561            Subjective:  Andrell states the back is still sore from the different way I am walking. I have been working the scar tissue on my knee.       Objective:   Treatment:  Therapeutic Exercise: to improve: Flexibility/ROM, Stabilization and Strength   Wealthy pelvis     Pulled fwd:  SAQ 10 repetitions  Standing terminal knee extension 2 sets of 10 repetitions     NMR:    R pelvic AE - RI, PH and COI    Therapeutic Activities:  N/A    Manual Therapy:   Manual mobilization to the superficial fascia of anterior and posterior knee  Scar mobilization to reduce adhesions and improve skin mobility with scar approximation  STM R gastroc soleus, peroneals, bony contours patella and tibial tuberosity and fibular head  STM R QL, lat, thoracolumbar fascia and hip rotators        Initial Evaluation Reference and/orCurrent Measurements (ROM, Strength, Girth, Outcomes, etc.):      Flexion: 98 degrees  ROM: missing 4 to straight.     Modalities: None  Therapy Rationale: Other: N/A       Assessment (response to treatment):   Pt continues to make good progress with improved ROM and decreasing pain. Pt able to don and doff shoes today with min difficulty. Addressed pelvic dysfunction today and instructed in HEP to improve core NMC and MC so pt can advance R LE with improved efficiency.     Progress towards functional goals:   Goals:  Date (Body Area, Impairment Goal, Functional   Activity, Target Performance) Time Frame Status Date/  Initial   09/10/16   Patient will improve score on LEFS to >= 70% to indicate improved function  12 sessions Initial Eval       Pt will demo hip hinge with neutral trunk and T-rex  arms to keep load close to lift 10 lbs from floor to chest without c/o pain to increase safety with lifting for ADL's    12 sessions Initial Eval      Patient will improve TKE to 0 degrees to  report ability to amb with symmetrical gait pattern without AD x 30 mins at a time to ease ADL performance daily  12 sessions In Progress - 4 to straight 09/18/2016 GVS      Patient will improve R knee flexion to >=120 degrees to  go up and down 12, 7 inch stairs with 1 rail and RP without c/o knee pain to ease stair navigation at home and in community daily  12  Sessions Improving with knee ROM 09/18/2016 GVS       Patient requires continued skilled care to: Improve swelling, knee ROM    Plan:  Continue with Plan of Care    Milon Score Sickle, PT DPT CFMT [phone]    09/18/2016

## 2016-09-19 ENCOUNTER — Encounter (INDEPENDENT_AMBULATORY_CARE_PROVIDER_SITE_OTHER): Payer: BLUE CROSS/BLUE SHIELD | Admitting: Orthopaedic Surgery

## 2016-09-20 ENCOUNTER — Inpatient Hospital Stay: Payer: BLUE CROSS/BLUE SHIELD | Attending: Orthopaedic Surgery

## 2016-09-20 DIAGNOSIS — M25561 Pain in right knee: Secondary | ICD-10-CM

## 2016-09-20 NOTE — PT/OT Therapy Note (Signed)
DAILY NOTE   09/20/2016        Total Treatment (billable)Time: 41   Total Timed Minutes:  41 Visit Number:  4/12 until 3/19      Payor: [employer] / Plan: [employer] [employer] / Product Type: *No Product type* /    # of Authorized Visits: 18 Visit #: 3      Diagnosis (Treating/Medical):     ICD-10-CM    1. Acute pain of right knee M25.561            Subjective:  Kent Jordan states "The knee is a little stiff but the last of my steri strips fell off.  I am getting some pretty good range of motion with it."     Objective:   Treatment:  Therapeutic Exercise: to improve: Flexibility/ROM, Stabilization and Strength   Standing TKE with black TB 2 sets of 10 repetitions   Step ups onto 4 inch step for quad strength.     NMR:    End range isometric hold with verbal and tactile cues as well as manual facilitation to improve end range motor control during knee extension  Combination of isotonics at with verbal and tactile cues to improve end range stability and motor control during knee extension.     Therapeutic Activities:  N/A    Manual Therapy:   Manual mobilization to the superficial fascia of anterior knee  Bony contour clearing to patella, tibial tubercle  Soft tissue mobilization to quad, gastroc, hamstring, infrapatellar tendon  Trigger point manual release with long duration pressure to quad   Neural tracing of saphenous nerve.         Initial Evaluation Reference and/orCurrent Measurements (ROM, Strength, Girth, Outcomes, etc.):      Flexion: 103 degrees  ROM: missing 2 to straight.     Modalities: None  Therapy Rationale: Other: N/A       Assessment (response to treatment):   Patient demonstrates improving reciprocity of gait this session.  Demonstrates good improvement in ROM as well which is translating to improved TKE.     Progress towards functional goals:   Goals:  Date (Body Area, Impairment Goal, Functional   Activity, Target Performance) Time Frame Status Date/  Initial   09/10/16   Patient will improve score on LEFS to >= 70%  to indicate improved function  12 sessions Initial Eval       Pt will demo hip hinge with neutral trunk and T-rex arms to keep load close to lift 10 lbs from floor to chest without c/o pain to increase safety with lifting for ADL's    12 sessions Initial Eval      Patient will improve TKE to 0 degrees to  report ability to amb with symmetrical gait pattern without AD x 30 mins at a time to ease ADL performance daily  12 sessions In Progress - 2 to straight 09/20/16   - AJP       Patient will improve R knee flexion to >=120 degrees to  go up and down 12, 7 inch stairs with 1 rail and RP without c/o knee pain to ease stair navigation at home and in community daily  12  Sessions Improving with knee ROM 09/18/2016 GVS       Patient requires continued skilled care to: Improve swelling, knee ROM    Plan:  Continue with Plan of Care    Deborra Medina, PT, DPT, ATC  [place] # [phone]     09/20/2016

## 2016-09-25 ENCOUNTER — Inpatient Hospital Stay
Payer: BLUE CROSS/BLUE SHIELD | Attending: Orthopaedic Surgery | Admitting: Rehabilitative and Restorative Service Providers"

## 2016-09-25 DIAGNOSIS — M25561 Pain in right knee: Secondary | ICD-10-CM | POA: Insufficient documentation

## 2016-09-25 NOTE — PT/OT Therapy Note (Signed)
DAILY NOTE   09/25/2016        Total Treatment (billable)Time: 41   Total Timed Minutes:  41 Visit Number:  5/12 until 12/10/16      Payor: [employer] / Plan: [employer] / Product Type: *No Product type* /    # of Authorized Visits: 18 Visit #: 5      Diagnosis (Treating/Medical):     ICD-10-CM    1. Acute pain of right knee M25.561        Subjective:  Bijan states the scar still feels very tight and I am having aching on both sides of my R ankle, is that normal. I have had to wear the compression stocking again because I am having swelling in the lower leg. But I can cross my R ankle over my L knee.     Objective:   Treatment:  Therapeutic Exercise: to improve: Flexibility/ROM, Stabilization and Strength       NMR:    R quad set PH  R ankle DF and PF PH and COI    Therapeutic Activities:  N/A    Manual Therapy:   STM R posterior and anterior tibialis, peroneals, bony contours tibia  STM T gastroc soleus, knee retinaculum  Bony contours R calcaneus, talus, malleoli   Distraction grd III R calcaneus and talus, medial and lateral glide grd III calcaneus     Initial Evaluation Reference and/orCurrent Measurements (ROM, Strength, Girth, Outcomes, etc.):      Flexion: 113 degrees  Ext: 0 - hard end feel     Modalities: None  Therapy Rationale: Other: N/A       Assessment (response to treatment):   After tx pt able to flex R knee 120 deg and ext to 0 however hard end feel. Pt reported feeling significantly looser and less pain in the R ankle.     Progress towards functional goals:   Goals:  Date (Body Area, Impairment Goal, Functional   Activity, Target Performance) Time Frame Status Date/  Initial   09/10/16   Patient will improve score on LEFS to >= 70% to indicate improved function  12 sessions Initial Eval       Pt will demo hip hinge with neutral trunk and T-rex arms to keep load close to lift 10 lbs from floor to chest without c/o pain to increase safety with lifting for ADL's    12 sessions Initial Eval      Patient will  improve TKE to 0 degrees to  report ability to amb with symmetrical gait pattern without AD x 30 mins at a time to ease ADL performance daily  12 sessions In Progress - 2 to straight 09/20/16   - AJP       Patient will improve R knee flexion to >=120 degrees to  go up and down 12, 7 inch stairs with 1 rail and RP without c/o knee pain to ease stair navigation at home and in community daily  12  Sessions Improving with knee ROM 09/18/2016 GVS       Patient requires continued skilled care to: Improve swelling, knee ROM    Plan:  Continue with Plan of Care    Telford Nab, PT DPT CFMT [phone]    09/25/2016

## 2016-09-27 ENCOUNTER — Inpatient Hospital Stay: Payer: BLUE CROSS/BLUE SHIELD | Attending: Orthopaedic Surgery

## 2016-09-27 DIAGNOSIS — M25561 Pain in right knee: Secondary | ICD-10-CM

## 2016-09-27 NOTE — PT/OT Therapy Note (Signed)
DAILY NOTE   09/27/2016        Total Treatment (billable)Time: 40   Total Timed Minutes:  40 Visit Number:  6/12 until 12/10/16      Payor: [employer] / Plan: [employer] / Product Type: *No Product type* /    # of Authorized Visits: 18 Visit #: 6      Diagnosis (Treating/Medical):     ICD-10-CM    1. Acute pain of right knee M25.561        Subjective:  Kent Jordan states knee has been feeling relatively good recently. He recently walked dog about 1.5 miles, with only slight stiffness the following day. Stairs continue to be easier. Prolonged positioning leads to stiffness and difficulty with pivoting when walking is a primary concern of his.     Objective:   Treatment:  Therapeutic Exercise: to improve: Flexibility/ROM, Stabilization and Strength   Wealthy pelvis- 10 reps- 1 set with tactile cueing to improve form in order to improve trunk elongation in stance with walking.  Mini Squat- 10 reps-2 set to improve LE strength for functional daily activities.    NMR:    End range isometric hold with verbal and tactile cues as well as manual facilitation to improve end range motor control during hip IR/ER  Combination of isotonics at with verbal and tactile cues to improve end range stability and motor control during hip IR/ER      Therapeutic Activities:  N/A    Manual Therapy:   Soft tissue mobilization to adductors, hamstrings  Soft tissue mobilization to hip rotators with hip IR/ER.    Scar tissue mobilization surrounding anterior incision site   Trigger point strumming with perpendicular force to adductors       Initial Evaluation Reference and/orCurrent Measurements (ROM, Strength, Girth, Outcomes, etc.):      Flexion: 112 degrees  Ext: 0 - springy end feel     Knee tracking femoral external rotation R>L (improved following MT and NMR to hip IR/ER)    Modalities: None  Therapy Rationale: Other: N/A       Assessment (response to treatment):   Hip mobility appears to be limited somewhat due to limited glute med, min, TFL mobility.  Following STM to area, knee tracking improved with better ability to flex knee without increased external rotation. Knee ROM appear to be limited due to hamstring, gastroc, adductor length. Spring end feel improved following STM to these area. Pt. Is not achieving terminal knee extension in stance and muscular control within this range to be addressed at follow up visit.      Progress towards functional goals:   Goals:  Date (Body Area, Impairment Goal, Functional   Activity, Target Performance) Time Frame Status Date/  Initial   09/10/16   Patient will improve score on LEFS to >= 70% to indicate improved function  12 sessions Initial Eval       Pt will demo hip hinge with neutral trunk and T-rex arms to keep load close to lift 10 lbs from floor to chest without c/o pain to increase safety with lifting for ADL's    12 sessions Initial Eval      Patient will improve TKE to 0 degrees to  report ability to amb with symmetrical gait pattern without AD x 30 mins at a time to ease ADL performance daily  12 sessions In Progress - 2  to straight 09/20/16   - AJP       Patient will improve R knee  flexion to >=120 degrees to  go up and down 12, 7 inch stairs with 1 rail and RP without c/o knee pain to ease stair navigation at home and in community daily  12  Sessions Improving with knee ROM- 112 degs 09/27/16   - AJP       Patient requires continued skilled care to: Improve swelling, knee ROM, muscular strength, hip mobility    Plan:  Continue with Plan of Care   Assess hip joint mobility and soft tissue restrictions  Terminal knee extension exercises  Test use of elliptical for HEP.     Deborra Medina, PT, DPT, ATC  [place] # [phone]     09/27/2016

## 2016-10-01 ENCOUNTER — Inpatient Hospital Stay: Payer: BLUE CROSS/BLUE SHIELD | Attending: Orthopaedic Surgery | Admitting: Physical Therapist

## 2016-10-01 DIAGNOSIS — M25561 Pain in right knee: Secondary | ICD-10-CM

## 2016-10-01 NOTE — PT/OT Therapy Note (Signed)
DAILY NOTE   10/01/2016        Total Treatment (billable)Time:  40 min Total Timed Minutes: 40 min  Visit Number:  7/12 until 12/10/16      Payor: [employer] BC[employer] / Plan: [employer] BCBS / Product Type: *No Product type* /    # of Authorized Visits: 18 Visit #: 7      Diagnosis (Treating/Medical):     ICD-10-CM    1. Acute pain of right knee M25.561        Subjective:  I lifted some boxes over the weekend, one was at least 35 to 45 pounds.  I hurt my back and knee, Since then I have had swelling on the outside of the knee.        Objective:   Treatment:  Therapeutic Exercise: to improve: Flexibility/ROM, Stabilization and Strength   3 part SLR x 5 reps with CW/CCW x 10 reps each direction   Attempted wall lunge with DF x 10 reps but patient anxious  about hyperextension so modified to:   Wall lunge with focus on TKE with SLS  with contralateral knee/hip  flexion x 10 reps x 2 sets    NMR:    R pelvic PD end range holds, then added R LE ext/ abd/ IR to facilitate TKE R    Therapeutic Activities:  N/A    Manual Therapy:   Extensive bony clearing R patella  STMobs to R patellar tendon , medial and lateral retinaculum, prox quads  Distraction to R patella via plunger and ankle pump FM       Initial Evaluation Reference and/orCurrent Measurements (ROM, Strength, Girth, Outcomes, etc.):      Flexion: 112 degrees. Post: 120   Ext: 0 - springy end feel       Modalities: None  Therapy Rationale: Other: N/A       Assessment (response to treatment): Improving patellar mobility ,R knee flexion and ext AROM.  Improved quad contraction/ TKE and Progressed to WB TKE PH's with wall lunge.        Progress towards functional goals:   Goals:  Date (Body Area, Impairment Goal, Functional   Activity, Target Performance) Time Frame Status Date/  Initial   09/10/16   Patient will improve score on LEFS to >= 70% to indicate improved function  12 sessions Initial Eval       Pt will demo hip hinge with neutral trunk and T-rex arms to keep load close to lift  10 lbs from floor to chest without c/o pain to increase safety with lifting for ADL's    12 sessions Progressing with knee flexion to improve squats 10/01/16/ MG     Patient will improve TKE to 0 degrees to  report ability to amb with symmetrical gait pattern without AD x 30 mins at a time to ease ADL performance daily  12 sessions In Progress 0 after tx today 10/01/16 MG       Patient will improve R knee flexion to >=120 degrees to  go up and down 12, 7 inch stairs with 1 rail and RP without c/o knee pain to ease stair navigation at home and in community daily  12  Sessions Improving with knee ROM- 112 degs 09/27/16   - AJP       Patient requires continued skilled care to: Improve swelling, knee ROM, muscular strength, hip mobility    Plan:  Continue with Plan of Care   Weight acceptance, weight shift NMR  Progressed  Terminal knee extension exercises  Test use of elliptical for HEP.     Hassell Halim PT, MSPT  [phone]          10/01/2016

## 2016-10-04 ENCOUNTER — Inpatient Hospital Stay: Payer: BLUE CROSS/BLUE SHIELD | Attending: Orthopaedic Surgery

## 2016-10-04 DIAGNOSIS — M25561 Pain in right knee: Secondary | ICD-10-CM | POA: Insufficient documentation

## 2016-10-04 NOTE — PT/OT Therapy Note (Signed)
DAILY NOTE   10/04/2016        Total Treatment (billable)Time:  41 min Total Timed Minutes: 41 min  Visit Number:  8/12 until 12/10/16      Payor: [employer] BCBS / Plan: [employer] BCBS / Product Type: BCBS /    # of Authorized Visits: 18 Visit #: 7      Diagnosis (Treating/Medical):     ICD-10-CM    1. Acute pain of right knee M25.561        Subjective: Patient has had some soreness in knee within the past couple days. He states he was still able to walk dog for 20 mins yesterday without much issue. Has noticed some clicking in knee that is not painful.       Objective:   Treatment:  Therapeutic Exercise: to improve: Flexibility/ROM, Stabilization and Strength   Step ups x25 reps  Step down toe taps x 10 reps      NMR:    Prone terminal knee extension with manual resistance to improve quad and glut recruitment as well as facilitate improved motor control in end range knee extension.   Wall lunge with focus on TKE with SLS  with contralateral knee/hip  flexion x 10 reps x 2 sets    Therapeutic Activities:  N/A    Manual Therapy:   Soft tissue mobilization to hamstring with active knee extension/flexion, adductors  Soft tissue mobilization to anterior scar site, patellar retinaculum   Manual mobilization to the superficial fascia of patella   Scar mobilization to reduce adhesions and improve skin mobility            Initial Evaluation Reference and/orCurrent Measurements (ROM, Strength, Girth, Outcomes, etc.):      Flexion: 115 degrees.   Ext: 4 from neutral. Post 2 deg from neutral      Modalities: None  Therapy Rationale: Other: N/A       Assessment (response to treatment): Patient responded well to functional hamstring STM with improvement in knee extension ROM following. Gait pattern continues to improve with better single leg stability and terminal knee swing. Hamstring length appear to be primary limiter of knee extension, anterior knee fascial mobility and residual swelling limiting knee flexion. Pt. had difficulty with  eccentric quad control during step down toe taps.     Progress towards functional goals:   Goals:  Date (Body Area, Impairment Goal, Functional   Activity, Target Performance) Time Frame Status Date/  Initial   09/10/16   Patient will improve score on LEFS to >= 70% to indicate improved function  12 sessions Initial Eval       Pt will demo hip hinge with neutral trunk and T-rex arms to keep load close to lift 10 lbs from floor to chest without c/o pain to increase safety with lifting for ADL's    12 sessions Progressing with knee flexion to improve squats 10/01/16/ MG     Patient will improve TKE to 0 degrees to  report ability to amb with symmetrical gait pattern without AD x 30 mins at a time to ease ADL performance daily  12 sessions In Progress 0 after tx today 10/01/16 MG       Patient will improve R knee flexion to >=120 degrees to  go up and down 12, 7 inch stairs with 1 rail and RP without c/o knee pain to ease stair navigation at home and in community daily  12  Sessions Improving with knee ROM- 112 degs 09/27/16   -  AJP       Patient requires continued skilled care to: Improve swelling, knee ROM, muscular strength, hip mobility    Plan:  Continue with Plan of Care   Weight acceptance, weight shift NMR  Progressed Terminal knee extension exercises  Test use of elliptical for HEP.   Eccentric quad control     Deborra Medina, [place], DPT, [place]  [place] # [phone]           10/04/2016

## 2016-10-08 ENCOUNTER — Inpatient Hospital Stay: Payer: BLUE CROSS/BLUE SHIELD | Admitting: Physical Therapist

## 2016-10-11 ENCOUNTER — Inpatient Hospital Stay: Payer: BLUE CROSS/BLUE SHIELD | Attending: Orthopaedic Surgery

## 2016-10-11 DIAGNOSIS — M25561 Pain in right knee: Secondary | ICD-10-CM | POA: Insufficient documentation

## 2016-10-11 NOTE — PT/OT Therapy Note (Signed)
DAILY NOTE   10/11/2016        Total Treatment (billable)Time:  42 min Total Timed Minutes: 42 min  Visit Number:  9/12 until 12/10/16      Payor: [employer] BCBS / Plan: [employer] BCBS / Product Type: BCBS /    # of Authorized Visits: 18 Visit #: 9      Diagnosis (Treating/Medical):     ICD-10-CM    1. Acute pain of right knee M25.561        Subjective: Kent Jordan states that he had a small slip on ice and has had some increased muscle soreness and tightness in knee since. He states that he ran after his dog without thinking and noticed a little soreness afterwards, but reports he had not ran in 5 years.    Objective:   Treatment:  Therapeutic Exercise: to improve: Flexibility/ROM, Stabilization and Strength   N/A      NMR:    Prone terminal knee extension with manual resistance to improve quad and glut recruitment as well as facilitate improved motor control in end range knee extension.   Bridges with manual facillitation to pelvis improve muscular control of knee flexion  Bridging with lateral shifts and manual resistance   Step Down with toe taps to facilitate eccentric quadriceps lengthening with tapping for VMO recruitment     Therapeutic Activities:  N/A    Manual Therapy:   Soft tissue mobilization to adductors, in progressive amount of knee flexion  Soft tissue mobilization to hamstrings in grasshopper position  Soft tissue mobilization to anterior scar site, patellar retinaculum   Manual mobilization to the superficial fascia of patella   Scar mobilization to reduce adhesions and improve skin mobility            Initial Evaluation Reference and/orCurrent Measurements (ROM, Strength, Girth, Outcomes, etc.):      Flexion: 119 deg at start of session, 123 deg after manual therapy  Ext: 3 deg from neutral       Modalities: None  Therapy Rationale: Other: N/A       Assessment (response to treatment): Patient's increase pain and stiffness at start of session appears to be due to increase muscular tone of adductors and hamstrings.  Following STM to these regions, pt. was able to achieve increased ROM. He had difficulty with glute recruitment in bridging, however responded well to manual facilitation at the pelvis. Eccentric quad control was much improved at this visit and patient was able to perform step down with heel tap with less pain at this visit.    Progress towards functional goals:   Goals:  Date (Body Area, Impairment Goal, Functional   Activity, Target Performance) Time Frame Status Date/  Initial   09/10/16   Patient will improve score on LEFS to >= 70% to indicate improved function  12 sessions Initial Eval       Pt will demo hip hinge with neutral trunk and T-rex arms to keep load close to lift 10 lbs from floor to chest without c/o pain to increase safety with lifting for ADL's    12 sessions Progressing with knee flexion to improve squats 10/01/16/ MG     Patient will improve TKE to 0 degrees to  report ability to amb with symmetrical gait pattern without AD x 30 mins at a time to ease ADL performance daily  12 sessions In Progress 0 after tx today 10/01/16 MG       Patient will improve R knee flexion to >=120  degrees to  go up and down 12, 7 inch stairs with 1 rail and RP without c/o knee pain to ease stair navigation at home and in community daily  12  Sessions Improving-123 degs, end range pain 10/11/16   - AJP         Patient requires continued skilled care to: Improve swelling, knee ROM, muscular strength, hip mobility    Plan:  Continue with Plan of Care   Weight acceptance, weight shift NMR  Progressed Terminal knee extension exercises  Test use of elliptical for HEP.   Eccentric quad control     Deborra Medina, [place], DPT, [place]  [place] # [phone]  10/11/2016

## 2016-10-15 ENCOUNTER — Inpatient Hospital Stay: Payer: BLUE CROSS/BLUE SHIELD | Attending: Orthopaedic Surgery | Admitting: Physical Therapist

## 2016-10-15 DIAGNOSIS — M25561 Pain in right knee: Secondary | ICD-10-CM | POA: Insufficient documentation

## 2016-10-15 NOTE — PT/OT Therapy Note (Signed)
DAILY NOTE   10/15/2016        Total Treatment (billable)Time:  42 min Total Timed Minutes: 42 min  Visit Number:  9/12 until 12/10/16      Payor: [employer] BCBS / Plan: [employer] BCBS / Product Type: BCBS /    # of Authorized Visits: 18 Visit #: 9      Diagnosis (Treating/Medical):     ICD-10-CM    1. Acute pain of right knee M25.561        Subjective: Patient reports knee is feeling better, but states that (peroneals) are burning while walking the dog and it's sore (at fibular head)     Objective:   Treatment:  Therapeutic Exercise: to improve: Flexibility/ROM, Stabilization and Strength   Ankle DF/PF board x 2 minutes  Squats x 1 minute    NMR:    R ankle DF end range holds then COI     Therapeutic Activities:  N/A    Manual Therapy:   STMobs to lateral retinaculum R knee  Bony clearing R fibular head   Gapping and inferior glide prox tib fib joint grade 4 with CR to eversion  Post glide fibula grade 4 with bridge FM  Bony clearing medial/ lateral  Malleoli, talus, then AP mob grade 4  with CR to PF at end range DF with strap assist             Initial Evaluation Reference and/orCurrent Measurements (ROM, Strength, Girth, Outcomes, etc.):     Hard endfeel inferior glide R fibular head  Decreased stance time R LE during gait      Modalities: None  Therapy Rationale: Other: N/A       Assessment (response to treatment): Patient demonstrates improved CKC DF during gait with reports of less peroneal burning and improved heel strike/ toe off on R.  Continue to address R knee dysfunction this area was not addressed today and patient reported continued limitations with squats at end of session 2 to tightness anteriorly at patella.      Progress towards functional goals:   Goals:  Date (Body Area, Impairment Goal, Functional   Activity, Target Performance) Time Frame Status Date/  Initial   09/10/16   Patient will improve score on LEFS to >= 70% to indicate improved function  12 sessions Initial Eval       Pt will demo hip hinge with  neutral trunk and T-rex arms to keep load close to lift 10 lbs from floor to chest without c/o pain to increase safety with lifting for ADL's    12 sessions Progressing with knee flexion to improve squats 10/01/16/ MG     Patient will improve TKE to 0 degrees to  report ability to amb with symmetrical gait pattern without AD x 30 mins at a time to ease ADL performance daily  12 sessions In Progress 0 after tx today 10/01/16 MG       Patient will improve R knee flexion to >=120 degrees to  go up and down 12, 7 inch stairs with 1 rail and RP without c/o knee pain to ease stair navigation at home and in community daily  12  Sessions Improving-123 degs, end range pain 10/11/16   - AJP         Patient requires continued skilled care to: Improve swelling, knee ROM, muscular strength, hip mobility    Plan:  Continue with Plan of Care   Weight acceptance, weight shift NMR  Eccentric quad control  Hassell Halim PT, MSPT  [phone]  10/15/2016

## 2016-10-18 ENCOUNTER — Inpatient Hospital Stay: Payer: BLUE CROSS/BLUE SHIELD | Attending: Orthopaedic Surgery

## 2016-10-18 DIAGNOSIS — M25561 Pain in right knee: Secondary | ICD-10-CM | POA: Insufficient documentation

## 2016-10-18 NOTE — PT/OT Plan of Care (Signed)
Plan of Care   [facility] Medicare Provider #: [phone]                                                                                                                                                    Patient Name: Kent Jordan  MRN: [id number]  DOI:  08/20/16  DOS: 08/20/16    SOC:09/10/2016      Diagnosis:       ICD-10-CM     1. Acute pain of right knee M25.561           ASSESSMENT: Patient demonstrates improved eccentric control with step down, however still only able to perform 5-7 before fatigue. He has improve LEFS by 51%. He still requires skilled care to address functional strength loss, mobility including long duration walking, stair climbing, and improving remaining ROM loss, and other functional tasks indicated by goals below.      Patient requires Physical Therapy for the following:  Impairments:    Pain that limits and interferes with functional ability.    Impaired biomechanical alignment.   Decreased range of motion of the knee   Decreased strength of hip and knee   Decreased functional stability of the hip and core   Decreased joint mobility of the patellofemoral joint   Decreased soft tissue mobility of hip and thigh   Impaired gait mechanics.     Decreased/impaired motor control during squatting        Barriers to Rehabilitations/Comorbidities/personal  factors:  Past surgical history R meniscus surgery earlier this year  Comorbidities stimulator, arthritis, failed back syndrome, neuropathy      Pain located: R LE     Clinical presentation: evolving with activity level     Functional Limitations (PLOF): pain/ difficulty with housework, car transfers, donning shoes, walking, stair mobility prolonged standing, rolling over in bed.       Plan Of Care: Body Mechanics Education, NMR, Proprioceptive Activites, Instruction in HEP, Therapeutic Exercise, Therapeutic Activities, Balance/Gait training and Soft Tissue/Joint Mobilization thoracic/ lumbar spine, scapulae, bilateral LE's, lumbopelvic girdles  to address regionally interdependent dysfunctions     Frequency/Duration: 2 times a week for 12 sessions.        Certification Status Ends: 12/10/16     Goals:  Date (Body Area, Impairment Goal, Functional   Activity, Target Performance) Time Frame Status Date/  Initial   09/10/16   Patient will improve score on LEFS to >= 70% to indicate improved function  12 sessions Met- 76% 10/18/16   - AJP        Pt will demo hip hinge with neutral trunk and T-rex arms to keep load close to lift 10 lbs from floor to chest without c/o pain to increase safety with lifting for ADL's    12 sessions In Progress- improved squatting mechanics and strength 10/18/16   -  AJP         Patient will improve TKE to 0 degrees to  report ability to amb with symmetrical gait pattern without AD x 30 mins at a time to ease ADL performance daily  12 sessions In Progress 1 deg from neutral 10/18/16   - AJP         Patient will improve R knee flexion to >=120 degrees to  go up and down 12, 7 inch stairs with 1 rail and RP without c/o knee pain to ease stair navigation at home and in community daily  12  Sessions Improving-117 at end of session  10/18/16   - AJP         Signature: Deborra Medina, PT, DPT, ATC   # [phone]   Date: 10/18/2016    Signature: Harriet Masson, MD ___________________________ Date:     Patient Name: Kent Jordan  MRN: [id number]

## 2016-10-18 NOTE — PT/OT Therapy Note (Signed)
DAILY NOTE   10/18/2016        Total Treatment (billable)Time:  42 min Total Timed Minutes: 42 min  Visit Number:  10/12 until 12/10/16      Payor: [employer] / Plan: [employer] / Product Type: BCBS /    # of Authorized Visits: 18 Visit #: 11      Diagnosis (Treating/Medical):     ICD-10-CM    1. Acute pain of right knee M25.561        Subjective: Patient reports knee feels improved after last session. He has increased his walking and had some associated soreness. Overall hoping to improve his standing and walking duration in order to volunteer at [employer].    Objective:   Treatment:  Therapeutic Exercise: to improve: Flexibility/ROM, Stabilization and Strength   N/A    NMR:    Supine bridging with manual facilitation with approximation or traction as needed at knees or pelvis using PNF techniques to improve glut and core co-activation, neuromuscular control of gluts and core, and posterior chain initiation.   Supine bridging with weight shifting and COI into R sided weight acceptance  Step up/down toe taps with facilitation into weight acceptance to improve control in/out of knee flexion.    Therapeutic Activities:  N/A    Manual Therapy:   STMobs to lateral retinaculum R knee  Bony clearing R fibular head. patella  Manual mobilization to the superficial fascia of anterior lateral knee  Soft tissue mobilization to proximal adductors with knee flexion, extension FM       Initial Evaluation Reference and/orCurrent Measurements (ROM, Strength, Girth, Outcomes, etc.):   Knee ROM 115 at beginning of session, 117 at end.    Range of Motion: (degrees)        Right  AROM IE Knee    Left AROM IE    Left AROM 10/18/2016    120 Flexion 100 degrees 115    0 Extension -5  1 from neutral   (blank fields were intentionally left blank)     Hip AROM:   L IR 12, ER 20 . HISL: early movement of inominate   R IE 15, ER 25.  HISL: Early movement of inominate.       Ankle AROM:   R DF: 5, PF 30   L DF 5, PF 30        Strength:  R    R 10/18/2016   LE Strength  MMT = /5     L L 10/18/2016    3+ tight in quad  4 Hip Flexion 3+  4      Hip Extension         Hip Abduction         Hip Adduction      3+ 4 Knee Flexion   4+   3+ 4+ Knee Extension 5 5   4 5  Ankle Dorsiflexion 4- 4+   4 5 Ankle Plantarflexion 5 5             (blank fields were intentionally left blank)     Modalities: None   Therapy Rationale: Other: N/A       Assessment (response to treatment): Patient demonstrates improved eccentric control with step down, however still only able to perform 5-7 before fatigue. He has improve LEFS by 51%. He still requires skilled care to address functional strength loss, mobility including long duration walking, stair climbing, and improving remaining ROM loss, and other functional tasks indicated by goals  below.    Progress towards functional goals: improving stair climbing abilities, walking duration  Goals:  Date (Body Area, Impairment Goal, Functional   Activity, Target Performance) Time Frame Status Date/  Initial   09/10/16   Patient will improve score on LEFS to >= 70% to indicate improved function  12 sessions Met- 76% 10/18/16   - AJP        Pt will demo hip hinge with neutral trunk and T-rex arms to keep load close to lift 10 lbs from floor to chest without c/o pain to increase safety with lifting for ADL's    12 sessions In Progress- improved squatting mechanics and strength 10/18/16   - AJP         Patient will improve TKE to 0 degrees to  report ability to amb with symmetrical gait pattern without AD x 30 mins at a time to ease ADL performance daily  12 sessions In Progress 1 deg from neutral 10/18/16   - AJP         Patient will improve R knee flexion to >=120 degrees to  go up and down 12, 7 inch stairs with 1 rail and RP without c/o knee pain to ease stair navigation at home and in community daily  12  Sessions Improving-117 at end of session  10/18/16   - AJP           Patient requires continued skilled care to: Improve swelling, knee ROM, muscular  strength, hip mobility    Plan:  Continue with Plan of Care   Seated weight acceptance onto RLE  Eccentric quad control     Deborra Medina, PT, DPT, ATC  [place] # [phone]    10/18/2016

## 2016-10-22 ENCOUNTER — Inpatient Hospital Stay: Payer: BLUE CROSS/BLUE SHIELD | Attending: Orthopaedic Surgery | Admitting: Physical Therapist

## 2016-10-22 ENCOUNTER — Other Ambulatory Visit (INDEPENDENT_AMBULATORY_CARE_PROVIDER_SITE_OTHER): Payer: Self-pay

## 2016-10-22 DIAGNOSIS — M25561 Pain in right knee: Secondary | ICD-10-CM | POA: Insufficient documentation

## 2016-10-22 NOTE — PT/OT Therapy Note (Signed)
DAILY NOTE   10/22/2016        Total Treatment (billable)Time: 43  min Total Timed Minutes: 43 min  Visit Number:  11/12 until 12/10/16      Payor: [employer] / Plan: [employer] [employer] / Product Type: BCBS /    # of Authorized Visits: 18 Visit #: 12      Diagnosis (Treating/Medical):     ICD-10-CM    1. Acute pain of right knee M25.561        Subjective: I was walking with the dog this weekend, and my back hurts especially at the sacrum. My back is bothering me in all positions, especially with donning shoes. If I slouch at all, my back spasms.    Objective:   Treatment:  Therapeutic Exercise: to improve: Flexibility/ROM, Stabilization and Strength   N/a     NMR:    Prone:  End range holds L LE ER after on axis mobs   Deep breathing to improve relaxation and decrease LB muscle spasm    Therapeutic Activities:  N/A    Manual Therapy:   BC iliac crests, 12th ribs, bilateral sacral ILAs  STMobs to QL, glut max with cluneal nerves   Skin/ superficial fascia sacrum, bilateral lumbar spine  Supine:  Manual stretches to L then R hip flexion - gentle         Initial Evaluation Reference and/orCurrent Measurements (ROM, Strength, Girth, Outcomes, etc.):   L3-S1 fusion       Knee ROM 115 at beginning of session, 117 at end.    Range of Motion: (degrees)        Right  AROM IE Knee    Left AROM IE    Left AROM 10/18/2016    120 Flexion 100 degrees 115    0 Extension -5  1 from neutral   (blank fields were intentionally left blank)     Hip AROM:   L IR 12, ER 20 . HISL: early movement of inominate   R IE 15, ER 25.  HISL: Early movement of inominate.       Ankle AROM:   R DF: 5, PF 30   L DF 5, PF 30        Strength:  R    R 10/18/2016  LE Strength  MMT = /5     L L 10/18/2016    3+ tight in quad  4 Hip Flexion 3+  4      Hip Extension         Hip Abduction         Hip Adduction      3+ 4 Knee Flexion   4+   3+ 4+ Knee Extension 5 5   4 5  Ankle Dorsiflexion 4- 4+   4 5 Ankle Plantarflexion 5 5             (blank fields were intentionally left  blank)     Modalities: None   Therapy Rationale: Other: N/A       Assessment (response to treatment): LBP aggravated with gait dysfunction 2 to R knee surgery.  Addressed lumbar dysfunction to improve squats, walking, and transfers to improve independence with ADL.      Progress towards functional goals: improving stair climbing abilities, walking duration  Goals:  Date (Body Area, Impairment Goal, Functional   Activity, Target Performance) Time Frame Status Date/  Initial   09/10/16   Patient will improve score on LEFS to >= 70% to indicate  improved function  12 sessions Met- 76% 10/18/16   - AJP        Pt will demo hip hinge with neutral trunk and T-rex arms to keep load close to lift 10 lbs from floor to chest without c/o pain to increase safety with lifting for ADL's    12 sessions In Progress- improved squatting mechanics and strength 10/18/16   - AJP         Patient will improve TKE to 0 degrees to  report ability to amb with symmetrical gait pattern without AD x 30 mins at a time to ease ADL performance daily  12 sessions In Progress 1 deg from neutral 10/18/16   - AJP         Patient will improve R knee flexion to >=120 degrees to  go up and down 12, 7 inch stairs with 1 rail and RP without c/o knee pain to ease stair navigation at home and in community daily  12  Sessions Improving-117 at end of session  10/18/16   - AJP           Patient requires continued skilled care to: Improve swelling, knee ROM, muscular strength, hip mobility    Plan:  Continue with Plan of Care   Continue to address lumbar spine dysfunction   Seated weight acceptance onto RLE  Eccentric quad control     Hassell Halim PT, MSPT  [phone]      10/22/2016

## 2016-10-24 ENCOUNTER — Ambulatory Visit (INDEPENDENT_AMBULATORY_CARE_PROVIDER_SITE_OTHER): Payer: BLUE CROSS/BLUE SHIELD | Admitting: Orthopaedic Surgery

## 2016-10-24 ENCOUNTER — Encounter (INDEPENDENT_AMBULATORY_CARE_PROVIDER_SITE_OTHER): Payer: Self-pay | Admitting: Orthopaedic Surgery

## 2016-10-24 VITALS — BP 125/80 | HR 63 | Wt 181.0 lb

## 2016-10-24 DIAGNOSIS — G8929 Other chronic pain: Secondary | ICD-10-CM

## 2016-10-24 DIAGNOSIS — M5442 Lumbago with sciatica, left side: Secondary | ICD-10-CM

## 2016-10-24 DIAGNOSIS — Z471 Aftercare following joint replacement surgery: Secondary | ICD-10-CM

## 2016-10-24 DIAGNOSIS — Z96651 Presence of right artificial knee joint: Secondary | ICD-10-CM

## 2016-10-24 NOTE — Progress Notes (Signed)
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  He reports he is making good progress with therapy, currently ambulating  independently.  He denies any fevers, chill, purulent drainage, or other signs of infection. Pain is resolved.  He has however had a bit of a flareup of his low back pain and sciatic symptoms.    PE:   Ambulating independently with a non-antalgic gait.   Incision well-healed, without erythema, drainage, or collections  ROM: 0 degrees of extension to 125 degrees of flexion  Calves soft/NT, negative Homan's test and Thompson's sign    A/P: Approximately 8 weeks s/p right total knee replacement, making excellent progress for this stage of recovery.  He should continue working diligently on ROM and strengthening, gait training/stability, and functional restoration, as taught by physical therapy.   I did order him additional physical therapy for the knee, as well as to treat his low back and sciatic symptoms.  I did recommend lifetime antibiotic prophylaxis prior to dental visits.    I will see him back for followup at the one-year postoperative mark, with new AP, Lateral, and Sunrise Xrays of the knee at that time, or sooner if he has any problems in the meantime.

## 2016-10-25 ENCOUNTER — Inpatient Hospital Stay: Payer: BLUE CROSS/BLUE SHIELD | Attending: Orthopaedic Surgery | Admitting: Physical Therapist

## 2016-10-25 DIAGNOSIS — M25561 Pain in right knee: Secondary | ICD-10-CM | POA: Insufficient documentation

## 2016-10-25 NOTE — PT/OT Therapy Note (Signed)
DAILY NOTE   10/25/2016        Total Treatment (billable)Time: 45  min Total Timed Minutes: 45 min  Visit Number:  12/24 until 12/10/16      Payor: [employer] [employer] / Plan: [place] [employer] / Product Type: BCBS /    # of Authorized Visits: 12 Visit #: 1      Diagnosis (Treating/Medical):     ICD-10-CM    1. Acute pain of right knee M25.561        Subjective: My back is feeling much better.  I am looser in my back by the end of the day.  I am sleeping a 4-5 hours at night.      Objective:   Treatment:  Therapeutic Exercise: to improve: Flexibility/ROM, Stabilization and Strength   Elliptical x 5 minutes level 1     NMR:   SLR x 20 reps to facilitate R TKE     Therapeutic Activities:  N/A    Manual Therapy:   Skin/ superficial fascia R knee, scar mobs  STMobs to R quads with R LE hanging off edge of bed with knee flex/ ext FM  STMobs to R hamstrings with grasshopper FM         Initial Evaluation Reference and/orCurrent Measurements (ROM, Strength, Girth, Outcomes, etc.):   L3-S1 fusion           Range of Motion: (degrees)        Right  AROM IE Knee    Left AROM IE    Left AROM 10/18/2016    120 Flexion 100 degrees 115    0 Extension -5  1 from neutral   (blank fields were intentionally left blank)     Hip AROM:   L IR 12, ER 20 . HISL: early movement of inominate   R IE 15, ER 25.  HISL: Early movement of inominate.       Ankle AROM:   R DF: 5, PF 30   L DF 5, PF 30        Strength:  R    R 10/18/2016  LE Strength  MMT = /5     L L 10/18/2016    3+ tight in quad  4 Hip Flexion 3+  4      Hip Extension         Hip Abduction         Hip Adduction      3+ 4 Knee Flexion   4+   3+ 4+ Knee Extension 5 5   4 5  Ankle Dorsiflexion 4- 4+   4 5 Ankle Plantarflexion 5 5             (blank fields were intentionally left blank)     Modalities: None   Therapy Rationale: Other: N/A       Assessment (response to treatment): Rx received to treat LBP.  Improved R TKE during gait at end of session today.      Progress towards functional goals: improving  stair climbing abilities, walking duration  Goals:  Date (Body Area, Impairment Goal, Functional   Activity, Target Performance) Time Frame Status Date/  Initial   09/10/16   Patient will improve score on LEFS to >= 70% to indicate improved function  12 sessions Met- 76% 10/18/16   - AJP        Pt will demo hip hinge with neutral trunk and T-rex arms to keep load close to lift 10 lbs from floor to  chest without c/o pain to increase safety with lifting for ADL's    12 sessions In Progress- improved squatting mechanics and strength 10/18/16   - AJP         Patient will improve TKE to 0 degrees to  report ability to amb with symmetrical gait pattern without AD x 30 mins at a time to ease ADL performance daily  12 sessions In Progress 1 deg from neutral 10/18/16   - AJP         Patient will improve R knee flexion to >=120 degrees to  go up and down 12, 7 inch stairs with 1 rail and RP without c/o knee pain to ease stair navigation at home and in community daily  12  Sessions Improving-117 at end of session  10/18/16   - AJP           Patient requires continued skilled care to: Improve swelling, knee ROM, muscular strength, hip mobility    Plan:  Continue with Plan of Care   Pelvic patterns to improve CFS    Hassell Halim PT, MSPT  [phone]      10/25/2016

## 2016-10-29 ENCOUNTER — Inpatient Hospital Stay: Payer: BLUE CROSS/BLUE SHIELD | Attending: Orthopaedic Surgery | Admitting: Physical Therapist

## 2016-10-29 DIAGNOSIS — M25561 Pain in right knee: Secondary | ICD-10-CM

## 2016-10-29 NOTE — PT/OT Therapy Note (Signed)
DAILY NOTE   10/29/2016        Total Treatment (billable)Time: 45  min Total Timed Minutes: 45 min  Visit Number:  12/24 until 12/10/16      Payor: [employer] BC[employer] / Plan: [place] BCBS / Product Type: BCBS /    # of Authorized Visits: 12 Visit #: 1      Diagnosis (Treating/Medical):     ICD-10-CM    1. Acute pain of right knee M25.561        Subjective: My knee is feeling really really stiff.  Medially it is clicking more, especially when I walk.      Objective:   Treatment:  Therapeutic Exercise: to improve: Flexibility/ROM  SL R quad stretch: verbal cues for hip flexion to protect lumbar spine, strap to increase stretch, verbal cues to reach back for 10 seconds    NMR:   Prone: TKE end range holds then COI     Therapeutic Activities:  N/A    Manual Therapy:   BC R patella  STMobs to R quads, hamstrings, medial and lateral retinaculum  Medial to lateral, then lateral to meedial  tib on fib mobs grade 4 with ankle pump FMs  Skin/ superficial fascia R knee, scar mobs  STMobs to R quads with R LE hanging off edge of bed with knee flex/ ext FM  STMobs to R hamstrings with grasshopper FM         Initial Evaluation Reference and/orCurrent Measurements (ROM, Strength, Girth, Outcomes, etc.):   L3-S1 fusion   Hypomobile R patella with clicking medially     Pulled fwd:     Range of Motion: (degrees)        Right  AROM IE Knee    Left AROM IE    Left AROM 10/18/2016    120 Flexion 100 degrees 115    0 Extension -5  1 from neutral   (blank fields were intentionally left blank)     Hip AROM:   L IR 12, ER 20 . HISL: early movement of inominate   R IE 15, ER 25.  HISL: Early movement of inominate.       Ankle AROM:   R DF: 5, PF 30   L DF 5, PF 30        Strength:  R    R 10/18/2016  LE Strength  MMT = /5     L L 10/18/2016    3+ tight in quad  4 Hip Flexion 3+  4      Hip Extension         Hip Abduction         Hip Adduction      3+ 4 Knee Flexion   4+   3+ 4+ Knee Extension 5 5   4 5  Ankle Dorsiflexion 4- 4+   4 5 Ankle Plantarflexion 5 5              (blank fields were intentionally left blank)     Modalities: None   Therapy Rationale: Other: N/A       Assessment (response to treatment): Progressing with R patella mobility, but continues with clicking.  Continue to improve R patellar mobility.    Progress towards functional goals: improving stair climbing abilities, walking duration  Goals:  Date (Body Area, Impairment Goal, Functional   Activity, Target Performance) Time Frame Status Date/  Initial   09/10/16   Patient will improve score on LEFS to >= 70% to indicate  improved function  12 sessions Met- 76% 10/18/16   - AJP        Pt will demo hip hinge with neutral trunk and T-rex arms to keep load close to lift 10 lbs from floor to chest without c/o pain to increase safety with lifting for ADL's    12 sessions In Progress- improved squatting mechanics and strength 10/18/16   - AJP         Patient will improve TKE to 0 degrees to  report ability to amb with symmetrical gait pattern without AD x 30 mins at a time to ease ADL performance daily  12 sessions In Progress 1 deg from neutral 10/18/16   - AJP         Patient will improve R knee flexion to >=120 degrees to  go up and down 12, 7 inch stairs with 1 rail and RP without c/o knee pain to ease stair navigation at home and in community daily  12  Sessions Progressing with ST and patellar mobility to improve stairs 10/29/16 MG           Patient requires continued skilled care to: Improve swelling, knee ROM, muscular strength, hip mobility    Plan:  Continue with Plan of Care   Pelvic patterns to improve CFS  Patellar mobility     Hassell Halim PT, MSPT  [phone]      10/29/2016

## 2016-10-31 ENCOUNTER — Inpatient Hospital Stay: Payer: BLUE CROSS/BLUE SHIELD | Attending: Orthopaedic Surgery | Admitting: Physical Therapist

## 2016-10-31 DIAGNOSIS — M25561 Pain in right knee: Secondary | ICD-10-CM

## 2016-10-31 NOTE — PT/OT Therapy Note (Signed)
DAILY NOTE   10/31/2016        Total Treatment (billable)Time: 43  min Total Timed Minutes: 43 min  Visit Number:  13/24 until 12/10/16      Payor: [employer] [employer] / Plan: [place] [employer] / Product Type: BCBS /    # of Authorized Visits: 12 Visit #: 3      Diagnosis (Treating/Medical):     ICD-10-CM    1. Acute pain of right knee M25.561        Subjective: Still feeling stiff, then knee is constantly clicking and it's uncomfortable    Objective:   Treatment:  Therapeutic Exercise: to improve: Flexibility/ROM  Assited warmup on RCB x 5 minutes   Wall lunge with PF x 15 reps  Standing hip hinge hamstrings stretch 10 sec tension on x 5 reps    Pulled fwd:    SL R quad stretch: verbal cues for hip flexion to protect lumbar spine, strap to increase stretch, verbal cues to reach back for 10 seconds    NMR:   Prone: TKE end range holds then COI     Therapeutic Activities:  N/A      Manual Therapy:   Hamstring mobs in [place] position   BC R patella  STMobs to R quads, hamstrings, medial l retinaculum  STMobs to R hamstrings with grasshopper FM         Initial Evaluation Reference and/orCurrent Measurements (ROM, Strength, Girth, Outcomes, etc.):   L3-S1 fusion   Hypomobile R patella with clicking medially     Pulled fwd:     Range of Motion: (degrees)        Right  AROM IE Knee    Left AROM IE    Left AROM 10/18/2016    120 Flexion 100 degrees 115    0 Extension -5  1 from neutral   (blank fields were intentionally left blank)     Hip AROM:   L IR 12, ER 20 . HISL: early movement of inominate   R IE 15, ER 25.  HISL: Early movement of inominate.       Ankle AROM:   R DF: 5, PF 30   L DF 5, PF 30        Strength:  R    R 10/18/2016  LE Strength  MMT = /5     L L 10/18/2016    3+ tight in quad  4 Hip Flexion 3+  4      Hip Extension         Hip Abduction         Hip Adduction      3+ 4 Knee Flexion   4+   3+ 4+ Knee Extension 5 5   4 5  Ankle Dorsiflexion 4- 4+   4 5 Ankle Plantarflexion 5 5             (blank fields were intentionally left  blank)     Modalities: None   Therapy Rationale: Other: N/A       Assessment (response to treatment):   R medial clicking prompted extensive clearing of patella, improving patellar mobility noted with improve gait mechanics, especially with improve R TKE.      Progress towards functional goals: improving stair climbing abilities, walking duration  Goals:  Date (Body Area, Impairment Goal, Functional   Activity, Target Performance) Time Frame Status Date/  Initial   09/10/16   Patient will improve score on LEFS to >= 70% to  indicate improved function  12 sessions Met- 76% 10/18/16   - AJP        Pt will demo hip hinge with neutral trunk and T-rex arms to keep load close to lift 10 lbs from floor to chest without c/o pain to increase safety with lifting for ADL's    12 sessions In Progress- improved squatting mechanics and strength 10/18/16   - AJP         Patient will improve TKE to 0 degrees to  report ability to amb with symmetrical gait pattern without AD x 30 mins at a time to ease ADL performance daily  12 sessions In Progress 1 deg from neutral 10/18/16   - AJP         Patient will improve R knee flexion to >=120 degrees to  go up and down 12, 7 inch stairs with 1 rail and RP without c/o knee pain to ease stair navigation at home and in community daily  12  Sessions Progressing with ST and patellar mobility to improve stairs 10/29/16 MG           Patient requires continued skilled care to: Improve swelling, knee ROM, muscular strength, hip mobility    Plan:  Continue with Plan of Care   Pelvic patterns with LE components to improve CFS and gait mechanics especially with hip and knee pivots  Patellar mobility     Hassell Halim PT, MSPT  [phone]      10/31/2016

## 2016-11-07 ENCOUNTER — Encounter (INDEPENDENT_AMBULATORY_CARE_PROVIDER_SITE_OTHER): Payer: Self-pay

## 2016-11-07 NOTE — Progress Notes (Signed)
Spoke with patient who is on vacation in [place] and slipped on the pool deck a few days ago.  Patient did not fall all the way down but notes a little discomfort in the knee joint.  Advised patient to continue to ice, use NSAID, and schedule appointment.  If pain dissipates, cancel appointment.  Patient is amenable to this plan of care.

## 2016-11-12 ENCOUNTER — Inpatient Hospital Stay: Payer: BLUE CROSS/BLUE SHIELD | Attending: Orthopaedic Surgery | Admitting: Physical Therapist

## 2016-11-12 ENCOUNTER — Ambulatory Visit (INDEPENDENT_AMBULATORY_CARE_PROVIDER_SITE_OTHER): Payer: BLUE CROSS/BLUE SHIELD | Admitting: Orthopaedic Surgery

## 2016-11-12 DIAGNOSIS — M25561 Pain in right knee: Secondary | ICD-10-CM | POA: Insufficient documentation

## 2016-11-12 NOTE — PT/OT Therapy Note (Signed)
DAILY NOTE   11/12/2016        Total Treatment (billable)Time:  43 min Total Timed Minutes: 43 min  Visit Number:  14/24 until 12/10/16      Payor: [employer] / Plan: [employer] / Product Type: BCBS /    # of Authorized Visits: 12 Visit #: 3      Diagnosis (Treating/Medical):     ICD-10-CM    1. Acute pain of right knee M25.561        Subjective: I slipped and scraped my knee at the edge of the pool  on  Wednesday.  I have medial knee pain mostly and the knee feels tight. I kept busting the scab everytime I bent my knee, so I was walking with my leg straight for a while.       Objective:   Treatment:  Therapeutic Exercise: to improve: strength  Total gym x 3 minutes bilaterally: verbal cues for knee tracking over 2nd toe   Squats : verbal cues for co-contraction of glutes and quads x 20 reps     NMR:   Resisted heel slides R LE flex/ add to ext/ abd    Therapeutic Activities:  N/A    Manual Therapy:   STMobs to R distal quads   BC R patella  STMobs to R quads, hamstrings, medial lateral retinaculum  STMobs to R hamstrings distally with quad sets       Initial Evaluation Reference and/orCurrent Measurements (ROM, Strength, Girth, Outcomes, etc.):   R knee flexion: pre 112 post 120   R knee extension: -4 . Post 0 degrees  L knee extension: 5 degrees hyperextension    Pulled fwd:     Range of Motion: (degrees)        Right  AROM IE Knee    Left AROM IE    Left AROM 10/18/2016    120 Flexion 100 degrees 115    0 Extension -5  1 from neutral   (blank fields were intentionally left blank)     Hip AROM:   L IR 12, ER 20 . HISL: early movement of inominate   R IE 15, ER 25.  HISL: Early movement of inominate.       Ankle AROM:   R DF: 5, PF 30   L DF 5, PF 30        Strength:  R    R 10/18/2016  LE Strength  MMT = /5     L L 10/18/2016    3+ tight in quad  4 Hip Flexion 3+  4      Hip Extension         Hip Abduction         Hip Adduction      3+ 4 Knee Flexion   4+   3+ 4+ Knee Extension 5 5   4 5  Ankle Dorsiflexion 4- 4+   4 5  Ankle Plantarflexion 5 5             (blank fields were intentionally left blank)     Modalities: None   Therapy Rationale: Other: N/A       Assessment (response to treatment):   Improved R knee extension with gait at end of session today, improved AROM but continues with decreased R pelvic AE/PD.  NMR next session to improve gait mechanics.      Progress towards functional goals: improving stair climbing abilities, walking duration  Goals:  Date (Body Area, Impairment  Goal, Functional   Activity, Target Performance) Time Frame Status Date/  Initial   09/10/16   Patient will improve score on LEFS to >= 70% to indicate improved function  12 sessions Met- 76% 10/18/16   - AJP        Pt will demo hip hinge with neutral trunk and T-rex arms to keep load close to lift 10 lbs from floor to chest without c/o pain to increase safety with lifting for ADL's    12 sessions In Progress- improved squatting mechanics and strength 10/18/16   - AJP         Patient will improve TKE to 0 degrees to  report ability to amb with symmetrical gait pattern without AD x 30 mins at a time to ease ADL performance daily  12 sessions In Progress 1 deg from neutral 10/18/16   - AJP         Patient will improve R knee flexion to >=120 degrees to  go up and down 12, 7 inch stairs with 1 rail and RP without c/o knee pain to ease stair navigation at home and in community daily  12  Sessions Progressing with ST and patellar mobility to improve stairs 10/29/16 MG           Patient requires continued skilled care to: Improve swelling, knee ROM, muscular strength, hip mobility    Plan:  Continue with Plan of Care   Pelvic patterns with LE components to improve CFS and gait mechanics especially with hip and knee pivots  Patellar mobility     Hassell Halim PT, MSPT  [phone]      11/12/2016

## 2016-11-14 ENCOUNTER — Inpatient Hospital Stay: Payer: BLUE CROSS/BLUE SHIELD | Attending: Orthopaedic Surgery | Admitting: Physical Therapist

## 2016-11-14 DIAGNOSIS — M25561 Pain in right knee: Secondary | ICD-10-CM

## 2016-11-14 NOTE — PT/OT Therapy Note (Signed)
DAILY NOTE   11/14/2016        Total Treatment (billable)Time:  45 min Total Timed Minutes: 45 min  Visit Number:  15/24 until 12/10/16      Payor: [employer] / Plan: [employer] / Product Type: BCBS /    # of Authorized Visits: 12 Visit #: 5      Diagnosis (Treating/Medical):     ICD-10-CM    1. Acute pain of right knee M25.561        Subjective: I hold my core in and make sure3 that I squeeze my glutes when I am walking.      slipped and scraped my knee at the edge of the pool  on  Wednesday.  I have medial knee pain mostly and the knee feels tight. I kept busting the scab everytime I bent my knee, so I was walking with my leg straight for a while.       Objective:   Treatment:  Therapeutic Exercise: to improve: strength  Assisted warmup on RCB x 5 minutes level 4   Total gym x 3 minutes bilaterally: verbal cues for knee tracking over 2nd toe   Squats : verbal cues for co-contraction of glutes and quads x 20 reps     NMR:   End range holds R ankle DF, progressed to COI  End range holds R hip extension      Therapeutic Activities:  N/A    Manual Therapy:   BC R fibular head   Distraction and inferior glide R fibular head   Post glide R fibular head grade 4  PAP  grade 4 medial/ lateral   AP grade 4 talus grade 4   STMobs to R quads and IT band  R hip extension FM       Initial Evaluation Reference and/orCurrent Measurements (ROM, Strength, Girth, Outcomes, etc.):   R knee flexion: pre 112 post 120   R knee extension: -4 . Post 0 degrees  L knee extension: 5 degrees hyperextension    Pulled fwd:     Range of Motion: (degrees)        Right  AROM IE Knee    Left AROM IE    Left AROM 10/18/2016    120 Flexion 100 degrees 115    0 Extension -5  1 from neutral   (blank fields were intentionally left blank)     Hip AROM:   L IR 12, ER 20 . HISL: early movement of inominate   R IE 15, ER 25.  HISL: Early movement of inominate.       Ankle AROM:   R DF: 5, PF 30   L DF 5, PF 30        Strength:  R    R 10/18/2016  LE Strength  MMT =  /5     L L 10/18/2016    3+ tight in quad  4 Hip Flexion 3+  4      Hip Extension         Hip Abduction         Hip Adduction      3+ 4 Knee Flexion   4+   3+ 4+ Knee Extension 5 5   4 5  Ankle Dorsiflexion 4- 4+   4 5 Ankle Plantarflexion 5 5             (blank fields were intentionally left blank)     Modalities: None   Therapy Rationale: Other:  N/A       Assessment (response to treatment):   Focused on improving ankle and hip mobility to improve gait mechanics, improved fibular head mobility and hip extension at end of session today.  Continue to improve mobility and NM control of LE chain R to improve function.      Progress towards functional goals: improving stair climbing abilities, walking duration  Goals:  Date (Body Area, Impairment Goal, Functional   Activity, Target Performance) Time Frame Status Date/  Initial   09/10/16   Patient will improve score on LEFS to >= 70% to indicate improved function  12 sessions Met- 76% 10/18/16   - AJP        Pt will demo hip hinge with neutral trunk and T-rex arms to keep load close to lift 10 lbs from floor to chest without c/o pain to increase safety with lifting for ADL's    12 sessions In Progress- improved squatting mechanics and strength 10/18/16   - AJP         Patient will improve TKE to 0 degrees to  report ability to amb with symmetrical gait pattern without AD x 30 mins at a time to ease ADL performance daily  12 sessions Progressing with hip and ankle mobility to improve gait mechanics 11/14/16 MG         Patient will improve R knee flexion to >=120 degrees to  go up and down 12, 7 inch stairs with 1 rail and RP without c/o knee pain to ease stair navigation at home and in community daily  12  Sessions Progressing with ST and patellar mobility to improve stairs 10/29/16 MG           Patient requires continued skilled care to: Improve swelling, knee ROM, muscular strength, hip mobility    Plan:  Continue with Plan of Care   NMR to improve gait mechanics and CFS  vs. Holding patterns      Kent Jordan PT, MSPT  [phone]      11/14/2016

## 2016-11-19 ENCOUNTER — Inpatient Hospital Stay: Payer: BLUE CROSS/BLUE SHIELD | Attending: Orthopaedic Surgery | Admitting: Physical Therapist

## 2016-11-19 DIAGNOSIS — M25561 Pain in right knee: Secondary | ICD-10-CM | POA: Insufficient documentation

## 2016-11-19 NOTE — PT/OT Therapy Note (Signed)
DAILY NOTE   11/19/2016        Total Treatment (billable)Time:  43 min Total Timed Minutes: 43 min  Visit Number:  17/24 until 12/10/16      Payor: [employer] / Plan: [employer] [employer] / Product Type: BCBS /    # of Authorized Visits: 12 Visit #: 6      Diagnosis (Treating/Medical):     ICD-10-CM    1. Acute pain of right knee M25.561        Subjective: I'm feeling pretty good!  I made a path to my elliptical this weekend, I'll hop on it after this appt.  Having some issues with my balance       Objective:   Treatment:  Therapeutic Exercise: to improve: strength, balance  Assisted warmup on RCB x 5 minutes level 4   High knee march x 2 minutes  Attempted lateral high steps but with increased back pain so discontinued    Total gym x 3 minutes bilaterally: verbal cues for knee tracking over 2nd toe   Squats : verbal cues for co-contraction of glutes and quads x 20 reps     NMR:   R pelvic PD end range holds then COI , added R knee flex/ ext COI component to improve TKE in gait    Therapeutic Activities:  N/A    Manual Therapy:   Skin/ superficial fascia L knee  Bony clearing bilat patella  STMObs to bilat patellar tendon  STMobs to R quads, medial/ lateral retinaculum             Initial Evaluation Reference and/orCurrent Measurements (ROM, Strength, Girth, Outcomes, etc.):     Gait assessment: decreased R PD with decrease R TKE  Pulled fwd:     Range of Motion: (degrees)        Right  AROM IE Knee    Left AROM IE    Left AROM 10/18/2016    120 Flexion 100 degrees 115    0 Extension -5  1 from neutral   (blank fields were intentionally left blank)     Hip AROM:   L IR 12, ER 20 . HISL: early movement of inominate   R IE 15, ER 25.  HISL: Early movement of inominate.       Ankle AROM:   R DF: 5, PF 30   L DF 5, PF 30        Strength:  R    R 10/18/2016  LE Strength  MMT = /5     L L 10/18/2016    3+ tight in quad  4 Hip Flexion 3+  4      Hip Extension         Hip Abduction         Hip Adduction      3+ 4 Knee Flexion   4+   3+ 4+  Knee Extension 5 5   4 5  Ankle Dorsiflexion 4- 4+   4 5 Ankle Plantarflexion 5 5             (blank fields were intentionally left blank)     Modalities: None   Therapy Rationale: Other: N/A       Assessment (response to treatment):  Improved R pelvic PD and TKE at end of session today, patient continues with issues on L with decreased L hip extension and stability with weight acceptance resulting in increased risk for falls, address L LE dysfunction to improve gait mechanics  and decrease risk for falls.       Progress towards functional goals: improving stair climbing abilities, walking duration  Goals:  Date (Body Area, Impairment Goal, Functional   Activity, Target Performance) Time Frame Status Date/  Initial   09/10/16   Patient will improve score on LEFS to >= 70% to indicate improved function  12 sessions Met- 76% 10/18/16   - AJP        Pt will demo hip hinge with neutral trunk and T-rex arms to keep load close to lift 10 lbs from floor to chest without c/o pain to increase safety with lifting for ADL's    12 sessions In Progress- improved squatting mechanics and strength 10/18/16   - AJP         Patient will improve TKE to 0 degrees to  report ability to amb with symmetrical gait pattern without AD x 30 mins at a time to ease ADL performance daily  12 sessions Progressing with hip and ankle mobility to improve gait mechanics 11/14/16 MG         Patient will improve R knee flexion to >=120 degrees to  go up and down 12, 7 inch stairs with 1 rail and RP without c/o knee pain to ease stair navigation at home and in community daily  12  Sessions Progressing with ST and patellar mobility to improve stairs 10/29/16 MG           Patient requires continued skilled care to: Improve swelling, knee ROM, muscular strength, hip mobility    Plan:  Continue with Plan of Care   NMR to improve gait mechanics and CFS vs. Holding patterns especially on L to improve function       Hassell Halim PT, MSPT  [phone]       11/19/2016

## 2016-11-21 ENCOUNTER — Inpatient Hospital Stay: Payer: BLUE CROSS/BLUE SHIELD | Attending: Orthopaedic Surgery | Admitting: Physical Therapist

## 2016-11-21 DIAGNOSIS — M25561 Pain in right knee: Secondary | ICD-10-CM

## 2016-11-21 NOTE — PT/OT Therapy Note (Signed)
DAILY NOTE   11/21/2016        Total Treatment (billable)Time:  43 min Total Timed Minutes: 43 min  Visit Number:  18/24 until 12/10/16      Payor: [employer] / Plan: [employer] [employer] / Product Type: BCBS /    # of Authorized Visits: 12 Visit #: 6      Diagnosis (Treating/Medical):     ICD-10-CM    1. Acute pain of right knee M25.561        Subjective: I went for a 3 mile walk with my dog, I didn't have any pain during the walk but afterwards I was exhausted, I iced my knee.      Objective:   Treatment:  Therapeutic Exercise: to improve: strength, balance  Assisted warmup on RCB x 5 minutes level 4 x 2 minutes before onset of fibular head pain R.   Ankle pumps after fibular head mobs x 15 reps  Inversion/ eversion x 15 reps  Heel slides R LE x 20 reps      NMR:   n/a    Therapeutic Activities:  N/A    Manual Therapy:   BC R fibular head  STMobs to R peroneals  INferior glide R fibular head grade 4   Post glide grade 4 fibular head with bridge FM  STMobs to R hamstrings in prone and hooklying with knee flexion FM especially to lateral hamstring attachment             Initial Evaluation Reference and/orCurrent Measurements (ROM, Strength, Girth, Outcomes, etc.):     Gait assessment: decreased R PD with decrease R TKE  Pulled fwd:     Range of Motion: (degrees)        Right  AROM IE Knee    Left AROM IE    Left AROM 10/18/2016    120 Flexion 100 degrees 115    0 Extension -5  1 from neutral   (blank fields were intentionally left blank)     Hip AROM:   L IR 12, ER 20 . HISL: early movement of inominate   R IE 15, ER 25.  HISL: Early movement of inominate.       Ankle AROM:   R DF: 5, PF 30   L DF 5, PF 30        Strength:  R    R 10/18/2016  LE Strength  MMT = /5     L L 10/18/2016    3+ tight in quad  4 Hip Flexion 3+  4      Hip Extension         Hip Abduction         Hip Adduction      3+ 4 Knee Flexion   4+   3+ 4+ Knee Extension 5 5   4 5  Ankle Dorsiflexion 4- 4+   4 5 Ankle Plantarflexion 5 5             (blank fields were  intentionally left blank)     Modalities: None   Therapy Rationale: Other: N/A       Assessment (response to treatment):  Patient continues with fibular head pain R with RCB, assess anterior glide of fibular head next session.  Improving function, especially with walking and transfers.    Progress towards functional goals: improving stair climbing abilities, walking duration  Goals:  Date (Body Area, Impairment Goal, Functional   Activity, Target Performance) Time Frame Status Date/  Initial   09/10/16   Patient will improve score on LEFS to >= 70% to indicate improved function  12 sessions Met- 76% 10/18/16   - AJP        Pt will demo hip hinge with neutral trunk and T-rex arms to keep load close to lift 10 lbs from floor to chest without c/o pain to increase safety with lifting for ADL's    12 sessions In Progress- improved squatting mechanics and strength 10/18/16   - AJP         Patient will improve TKE to 0 degrees to  report ability to amb with symmetrical gait pattern without AD x 30 mins at a time to ease ADL performance daily  12 sessions Met 11/21/16 MG         Patient will improve R knee flexion to >=120 degrees to  go up and down 12, 7 inch stairs with 1 rail and RP without c/o knee pain to ease stair navigation at home and in community daily  12  Sessions Progressing with ST and patellar mobility to improve stairs 10/29/16 MG           Patient requires continued skilled care to: Improve swelling, knee ROM, muscular strength, hip mobility    Plan:  Continue with Plan of Care   Check anterior glide of fibular head  NMR to improve gait mechanics and CFS vs. Holding patterns especially on L to improve function       Hassell Halim PT, MSPT  [phone]      11/21/2016

## 2016-11-26 ENCOUNTER — Inpatient Hospital Stay: Payer: BLUE CROSS/BLUE SHIELD | Attending: Orthopaedic Surgery | Admitting: Physical Therapist

## 2016-11-26 DIAGNOSIS — M25561 Pain in right knee: Secondary | ICD-10-CM | POA: Insufficient documentation

## 2016-11-26 NOTE — PT/OT Therapy Note (Signed)
DAILY NOTE   11/26/2016        Total Treatment (billable)Time:  43 min Total Timed Minutes:  Visit Number:  19/24 until 12/10/16      Payor: [employer] / Plan: [employer] [employer] / Product Type: BCBS /    # of Authorized Visits:   Visit #:        Diagnosis (Treating/Medical):     ICD-10-CM    1. Acute pain of right knee M25.561        Subjective: ON Saturday I worked on the top of the scar, it ws so tight so I got it with my wife's hairbrush.  R fibular head feels better after I get moving.      Objective:   Treatment:    Therapeutic Exercise: to improve: AROM/ flexibility  Assisted warmup on RCB x 5 minutes level 4 x 2 minutes before onset of fibular head pain R.   Ankle pumps after fibular head mobs x 15 reps  Inversion/ eversion x 15 reps throughout tx after mobs  Heel slides R LE x 20 reps throughout tx after mobs     NMR:   Wall lunge x 15 reps     Therapeutic Activities:  N/A    Manual Therapy:   STMobs to R Achilles tendon, R peroneals   BC R fibular head, medial and lateral malleoli  STMobs to R peroneals, gastroc/ soleus, anterior tib   PA grade 4  fibular head   STMobs to L hamstrings                Initial Evaluation Reference and/orCurrent Measurements (ROM, Strength, Girth, Outcomes, etc.):     R ankle DF: 15 post 10   Hard endfeel AP grade 4 medial and lateral malleoli     Range of Motion: (degrees)         Right  AROM IE Knee    Left AROM IE    Left AROM 10/18/2016    120 Flexion 100 degrees 115    0 Extension -5  1 from neutral   (blank fields were intentionally left blank)     Hip AROM:   L IR 12, ER 20 . HISL: early movement of inominate   R IE 15, ER 25.  HISL: Early movement of inominate.       Ankle AROM:   R DF: 5, PF 30   L DF 5, PF 30        Strength:  R    R 10/18/2016  LE Strength  MMT = /5     L L 10/18/2016    3+ tight in quad  4 Hip Flexion 3+  4      Hip Extension         Hip Abduction         Hip Adduction      3+ 4 Knee Flexion   4+   3+ 4+ Knee Extension 5 5   4 5  Ankle Dorsiflexion 4- 4+   4 5  Ankle Plantarflexion 5 5             (blank fields were intentionally left blank)     Modalities: None   Therapy Rationale: Other: N/A       Assessment (response to treatment): Improved R ankle DF at end of session with no reports of R fibular head pain.  Address pelvic NMR to improve CFS to decrease risk for falling during ambulation.  Goals:  Date (Body Area, Impairment Goal, Functional   Activity, Target Performance) Time Frame Status Date/  Initial   09/10/16   Patient will improve score on LEFS to >= 70% to indicate improved function  12 sessions Met- 76% 10/18/16   - AJP        Pt will demo hip hinge with neutral trunk and T-rex arms to keep load close to lift 10 lbs from floor to chest without c/o pain to increase safety with lifting for ADL's    12 sessions In Progress- improved squatting mechanics and strength 10/18/16   - AJP         Patient will improve TKE to 0 degrees to  report ability to amb with symmetrical gait pattern without AD x 30 mins at a time to ease ADL performance daily  12 sessions Met 11/21/16 MG         Patient will improve R knee flexion to >=120 degrees to  go up and down 12, 7 inch stairs with 1 rail and RP without c/o knee pain to ease stair navigation at home and in community daily  12  Sessions Progressing with ST and patellar mobility to improve stairs 10/29/16 MG           Patient requires continued skilled care to: Improve swelling, knee ROM, muscular strength, hip mobility    Plan:  Continue with Plan of Care   Pelvic NMR    Hassell Halim PT, MSPT  [phone]      11/26/2016

## 2016-11-27 ENCOUNTER — Inpatient Hospital Stay: Payer: BLUE CROSS/BLUE SHIELD | Attending: Orthopaedic Surgery | Admitting: Physical Therapist

## 2016-11-27 DIAGNOSIS — M25561 Pain in right knee: Secondary | ICD-10-CM | POA: Insufficient documentation

## 2016-11-27 NOTE — PT/OT Therapy Note (Signed)
DAILY NOTE   11/27/2016        Total Treatment (billable)Time:  45 min Total Timed Minutes:  Visit Number:  20/24 until 12/10/16      Payor: [employer] / Plan: [employer] [employer] / Product Type: BCBS /    # of Authorized Visits: 12 Visit #: 9      Diagnosis (Treating/Medical):     ICD-10-CM    1. Acute pain of right knee M25.561        Subjective: I feel pretty good, I felt some tightness  ON Saturday I worked on the top of the scar, it ws so tight so I got it with my wife's hairbrush.  R fibular head feels better after I get moving.      Objective:   Treatment:    Therapeutic Exercise: to improve:  strength, endurance  Hip hinge squats without dowel x 40 reps to improve strength, endurance  Assisted warmup on RCB x 5 minutes level 4 x 2 minutes before onset of fibular head pain R.   Ankle pumps after fibular head mobs x 15 reps  Inversion/ eversion x 15 reps throughout tx after mobs  Heel slides R LE x 20 reps throughout tx after mobs     NMR:    R then L Pelvic patterns AE/PD , end range holds, progressed to COI  Hip hinge squats with dowel to faciliate efficient alignment    Therapeutic Activities:  N/A    Manual Therapy:   Figure 4 sitting:  PA grade with strap to tibia on femur   STMObs to R lateral retinaculum  AP grade 4 to femur on tibia then tibia on femur R LE with ankle pump FMs  Medial and lateral then lateral to medial mobs of femur on tibia then tibia on femur                 Initial Evaluation Reference and/orCurrent Measurements (ROM, Strength, Girth, Outcomes, etc.):         Range of Motion: (degrees)         Right  AROM IE Knee    Left AROM IE    Left AROM 10/18/2016    120 Flexion 100 degrees 115    0 Extension -5  1 from neutral   (blank fields were intentionally left blank)     Hip AROM:   L IR 12, ER 20 . HISL: early movement of inominate   R IE 15, ER 25.  HISL: Early movement of inominate.       Ankle AROM:   R DF: 5, PF 30   L DF 5, PF 30        Strength:  R    R 10/18/2016  LE Strength  MMT = /5     L  L 10/18/2016    3+ tight in quad  4 Hip Flexion 3+  4      Hip Extension         Hip Abduction         Hip Adduction      3+ 4 Knee Flexion   4+   3+ 4+ Knee Extension 5 5   4 5  Ankle Dorsiflexion 4- 4+   4 5 Ankle Plantarflexion 5 5             (blank fields were intentionally left blank)     Modalities: None   Therapy Rationale: Other: N/A       Assessment (response  to treatment): Able to perform efficient hip hinge squat at end of session today, able to manage R fibular head sx with HEP.    Goals:  Date (Body Area, Impairment Goal, Functional   Activity, Target Performance) Time Frame Status Date/  Initial   09/10/16   Patient will improve score on LEFS to >= 70% to indicate improved function  12 sessions Met- 76% 10/18/16   - AJP        Pt will demo hip hinge with neutral trunk and T-rex arms to keep load close to lift 10 lbs from floor to chest without c/o pain to increase safety with lifting for ADL's    12 sessions In Progress- improved squatting mechanics and strength 10/18/16   - AJP         Patient will improve TKE to 0 degrees to  report ability to amb with symmetrical gait pattern without AD x 30 mins at a time to ease ADL performance daily  12 sessions Met 11/21/16 MG         Patient will improve R knee flexion to >=120 degrees to  go up and down 12, 7 inch stairs with 1 rail and RP without c/o knee pain to ease stair navigation at home and in community daily  12  Sessions Progressing with ST and patellar mobility to improve stairs 10/29/16 MG           Patient requires continued skilled care to: Improve swelling, knee ROM, muscular strength, hip mobility    Plan:  Continue with Plan of Care   Progress Pelvic NMR    Hassell Halim PT, MSPT  [phone]      11/27/2016

## 2016-12-07 ENCOUNTER — Encounter (INDEPENDENT_AMBULATORY_CARE_PROVIDER_SITE_OTHER): Payer: Self-pay | Admitting: Student in an Organized Health Care Education/Training Program

## 2016-12-07 ENCOUNTER — Ambulatory Visit (INDEPENDENT_AMBULATORY_CARE_PROVIDER_SITE_OTHER): Payer: BLUE CROSS/BLUE SHIELD | Admitting: Student in an Organized Health Care Education/Training Program

## 2016-12-07 DIAGNOSIS — I10 Essential (primary) hypertension: Secondary | ICD-10-CM

## 2016-12-07 DIAGNOSIS — E782 Mixed hyperlipidemia: Secondary | ICD-10-CM

## 2016-12-07 DIAGNOSIS — E119 Type 2 diabetes mellitus without complications: Secondary | ICD-10-CM | POA: Insufficient documentation

## 2016-12-07 NOTE — Patient Instructions (Signed)
Eating Heart-Healthy Food: Using the DASH Plan    Eating for your heart doesn't have to be hard or boring. You just need to know how to make healthier choices. The DASH eating plan has been developed to help you do just that. DASH stands for Dietary Approaches to Stop Hypertension. It is a plan that has been proven to be healthier for your heart and to lower your risk for high blood pressure. It can also help lower your risk for cancer, heart disease, osteoporosis, and diabetes.    Choosing from each food group  Choose foods from each of the food groups below each day. Try to get the recommended number of servings for each food group. The serving numbers are based on a diet of 2,000 calories a day.Talk to your doctor if you're unsure about your calorie needs. Along with getting the correct servings, the DASH plan also recommends a sodium intake less than 2,300 mg per day.                 2000-2015 [employer] [address], [place] [address]. All rights reserved. This information is not intended as a substitute for professional medical care. Always follow your healthcare professional's instructions.          Exercise for a Healthier Heart  You may wonder how you can improve the health of your heart. If you're thinking about exercise, you're on the right track. You don't need to become an athlete, but you do need a certain amount of brisk exercise to help strengthen your heart. If you have been diagnosed with a heart condition, your doctor may recommend exercise to help stabilize your condition. To help make exercise a habit, choose safe, fun activities.       Why exercise?  Exercising regularly offers many healthy rewards. It can help you do all of the following:   Improve your blood cholesterol levels to help prevent further heart trouble   Lower your blood pressure to help prevent a stroke or heart attack   Control diabetes, or reduce your risk of getting this disease   Improve  your heart and lung function   Reach and maintain a healthy weight   Make your muscles stronger and more limber so you can stay active   Prevent falls and fractures by slowing the loss of bone mass (osteoporosis)   Manage stress better    Exercise tips    Ease into your routine. Set small goals. Then build on them.    Exercise on most days. Aim for a total of 150 or more minutes of moderate to vigorous intensity activity each week. Consider 40 minutes, 3 to 4 times a week. For best results, activity should last for 40 minutes on average. It is OK to work up to the 40 minute period over time. Examples of moderate-intensity activity is walkingone mile in 15 minutes or 30 to 45 minutes of yard work.    Step up your daily activity level. Along with your exercise program, try being more active throughout the day. Walk instead of drive. Do more household tasks or yard work.    Choose one or more activities you enjoy. Walking is one of the easiest things you can do. You can also try swimming, riding a bike, or taking an exercise class.     2000-2015 [employer] [address], [place] [address]. All rights reserved. This information is not intended as a substitute   for professional medical care. Always follow your healthcare professional's instructions.

## 2016-12-07 NOTE — Progress Notes (Signed)
Patient ID: Kent Jordan  is a 66 y.o.  male.     Chief Complaint   Patient presents with   . new patient   . Establish Care      HPI    Problem   Type 2 Diabetes Mellitus Without Complication, Without Long-Term Current Use of Insulin    Medication(s): Metformin 500mg  daily  Side effects: none  Hypoglycemic episodes: none  Neuropathic Sx: no  Eye exam in the past year: Has appointment in April, follows with Dr. Jon Billings  PNA vaccine:~2016  Last urine microalbumin:    Review of labs showed:  Lab Results   Component Value Date    HGBA1C 6.3 (H) 06/29/2016    HGBA1C 7.1 (H) 05/30/2016    HGBA1C 6.9 (H) 05/24/2016          Mixed Hyperlipidemia    Medication(s): Zetia 10mg , Crestor 5mg   Side effects: Crestor causes rash at higher doses  36yr CVD risk:      Denies CP/dyspnea/dizziness/HA/orthopnea/edema   Counseled on diet and exercise    Review of lipid panel shows:  Lab Results   Component Value Date    CHOL 149 05/30/2016    CHOL 131 10/06/2015     Lab Results   Component Value Date    TRIG 115 05/30/2016    TRIG 103 10/06/2015      Lab Results   Component Value Date    HDL 38 (L) 05/30/2016    HDL 38 (L) 10/06/2015     Lab Results   Component Value Date    LDL 88 05/30/2016    LDL 72 10/06/2015          Essential Hypertension    Home BP: 120s/80s  Medications: amlodipine 10mg , HCTZ 25mg    Side Effects: no (avoid ARBs and ACEi d/t hyperkalemia and angioedema)  Diet/exercise: walking regularly  Smoking: quit 5 years ago, 35 pack year history  Symptoms: denies CP/SOB/dizziness/HA  Follows with cardiology    BP Readings from Last 3 Encounters:   12/07/16 125/79   10/24/16 125/80   09/05/16 147/83                The following portions of the patient's history were reviewed and updated as appropriate: current medications, allergies, past family history, past medical history, past social history, past surgical history and problem list.     PAST MEDICAL HISTORY:  Past Medical History:   Diagnosis Date   . Arrhythmia      when on ARB   . Arthritis     psoriatic and osteoarthritis    . Back pain    . Blood disorder 11/2014    Hemochromatosis   . Diabetes mellitus 2016    started metformin 05/2016 d/t A1C 7.1 - now checking BS 2x/day - average 70-110 in the last week - A1C on 06/29/16 = 6.3 , has also lost 16 lbs since 05/2016   . Difficulty in walking(719.7)     Uses cane to ambulate   . Elevated liver function tests     had abd u/s 07/05/16    . Failed back syndrome    . Fatty liver    . Hemochromatosis     phlebotomy every 3 months - last done 02/2016   . High cholesterol    . Hypertensive disorder 06/2016    norvasc recently increased to 10 mg daily - now controlled on meds - most recent BP 117/73   . Low back pain    .  Lung nodule 2012    benign - was monitored x  3 yrs by Dr. Bernita Raisin [facility]) - signed off from pulmonology - no further f/u required since 2015   . Neuropathy     Tingling left foot, numbness left toes   . Psoriasis    . Psoriatic arthritis     tx w/ enbrel   . S/P insertion of spinal cord stimulator 2015   . Sleep apnea     mild - does not have CPAP - was unable to tolerate, less snoring since turboplasty       PAST SURGICAL HISTORY:  Past Surgical History:   Procedure Laterality Date   . ABDOMINAL SURGERY  03/1971    double hernia repair   . ARTHROPLASTY, KNEE, TOTAL Right 08/20/2016    Procedure: ARTHROPLASTY, KNEE, TOTAL;  Surgeon: Harriet Masson, MD;  Location: [facility];  Service: Orthopedics;  Laterality: Right;  RIGHT TOTAL KNEE REPLACEMENT   . ARTHROSCOPY, KNEE Right 03/21/2016    Procedure: ARTHROSCOPY, KNEE, RIGHT, PARTIAL MEDIAL MENISCECTOMY, PARTIAL SYNOVECTOMY;  Surgeon: Verner Chol, MD;  Location: [facility];  Service: Orthopedics;  Laterality: Right;   . CATARACT EXT.WITH IOL Bilateral 2009   . FRACTURE SURGERY  1993    right middle finger   . INSERTION, SPINAL CORD STIMULATOR GENERATOR N/A 05/07/2014    Procedure: DORSAL COLUMN STIMULATOR PLACEMENT;  Surgeon: Tera Helper, MD;  Location:  [facility];  Service: Neurosurgery;  Laterality: N/A;  DCS PLACEMENT   . KNEE ARTHROCENTESIS  1983    right   . LUMBAR FUSION  10/2011   . MICRODISCECTOMY LUMBAR  06/2011, 07/2011   . SEPTOPLASTY  10/05/2015   . SKIN BIOPSY  11/2015    Negative       FAMILY HISTORY:  Family History   Problem Relation Age of Onset   . Heart disease Mother    . Heart disease Father        SOCIAL HISTORY:  Social History     Social History   . Marital status: Married     Spouse name: N/A   . Number of children: N/A   . Years of education: N/A     Social History Main Topics   . Smoking status: Former Smoker     Packs/day: 1.00     Years: 41.00     Quit date: 09/26/2011   . Smokeless tobacco: Never Used   . Alcohol use 1.2 oz/week     4 Cans of beer per week   . Drug use: No   . Sexual activity: Yes     Partners: Female     Birth control/ protection: Surgical     Other Topics Concern   . None     Social History Narrative   . None       ALLERGIES:  Allergies   Allergen Reactions   . Angiotensin Receptor Blockers Other (See Comments)     ARB combined with Celebrex caused Arrhythmia due to high K+   . Ciprofloxacin Swelling and Anaphylaxis     swelling   . Ace Inhibitors Angioedema   . Gabapentin Other (See Comments)     blurred vision, dizziness       Review of Systems   Constitutional: Negative for appetite change, chills, fatigue, fever and unexpected weight change.   HENT: Negative for congestion, rhinorrhea and sore throat.    Eyes: Negative for visual disturbance.   Respiratory: Negative for cough and shortness  of breath.    Cardiovascular: Negative for chest pain and palpitations.   Gastrointestinal: Negative for abdominal pain and blood in stool.   Endocrine: Negative for cold intolerance and heat intolerance.   Genitourinary: Negative for difficulty urinating.   Musculoskeletal: Negative for arthralgias.   Neurological: Negative for dizziness, weakness, numbness and headaches.   Hematological: Negative for adenopathy.    Psychiatric/Behavioral: Negative for dysphoric mood. The patient is not nervous/anxious.    All other systems reviewed and are negative.           OBJECTIVE     BP 125/79 (BP Site: Left arm, Patient Position: Sitting, Cuff Size: Medium)   Pulse 77   Temp 97.8 F (36.6 C) (Oral)   Resp 16   Wt 86.7 kg (191 lb 3.2 oz)   BMI 27.05 kg/m     General appearance: NAD, conversant  HEENT: PERRLA, EOMI, normal oropharynx, neck supple w/ FROM  Lungs: Clear to auscultation  CV: RRR, no MRGs; normal carotid upstroke and amplitude without bruits  Abdomen: Soft, non-tender; no masses or HSM  Extremities: No peripheral edema or digital cyanosis  Skin: no rash, lesions or ulcers  Psych: normal mood and affect, linear thought process, appropriated interactions, no Kent/HI     ASSESSMENT      Kent Jordan is a 66 y.o. male with   1. Essential hypertension     2. Type 2 diabetes mellitus without complication, without long-term current use of insulin     3. Mixed hyperlipidemia            PLAN       1. Essential hypertension  - At goal BP < 140/90, continue current regimen  - Encourage DASH diet and regular exercise, keep home BP log, call if elevated      2. Type 2 diabetes mellitus without complication, without long-term current use of insulin  - Goal A1c < 7, good control, encourage diabetic diet and regular exercise, continue current regimen     3. Mixed hyperlipidemia  - Goal LDL < 100, good control, statin benefit, cont statin, no sfx, encourage diet and exercise      F/u 3 months    Medication list reviewed with patient and updated as indicated.  Risk & Benefits of any new medication(s) were explained to the patient who verbalized understanding & agreed to the treatment plan.  Patient given a printed copy of AVS. Patient verbalized understanding of instructions given.

## 2016-12-07 NOTE — Progress Notes (Signed)
Nursing Documentation:  Limb alert status: Patient asked and denied any limb restrictions for blood pressure/blood draws.  Has the patient seen any other providers since their last visit: no  The patient is due for foot exam, spirometry and urine microalbumin

## 2016-12-14 ENCOUNTER — Other Ambulatory Visit (INDEPENDENT_AMBULATORY_CARE_PROVIDER_SITE_OTHER): Payer: Self-pay | Admitting: Student in an Organized Health Care Education/Training Program

## 2016-12-14 ENCOUNTER — Other Ambulatory Visit (FREE_STANDING_LABORATORY_FACILITY): Payer: BLUE CROSS/BLUE SHIELD

## 2016-12-14 LAB — CBC AND DIFFERENTIAL
Absolute NRBC: 0 10*3/uL
Basophils Absolute Automated: 0.07 10*3/uL (ref 0.00–0.20)
Basophils Automated: 1.1 %
Eosinophils Absolute Automated: 0.15 10*3/uL (ref 0.00–0.70)
Eosinophils Automated: 2.3 %
Hematocrit: 45.1 % (ref 42.0–52.0)
Hgb: 14.9 g/dL (ref 13.0–17.0)
Immature Granulocytes Absolute: 0.03 10*3/uL
Immature Granulocytes: 0.5 %
Lymphocytes Absolute Automated: 2.87 10*3/uL (ref 0.50–4.40)
Lymphocytes Automated: 43.4 %
MCH: 31.6 pg (ref 28.0–32.0)
MCHC: 33 g/dL (ref 32.0–36.0)
MCV: 95.6 fL (ref 80.0–100.0)
MPV: 9.7 fL (ref 9.4–12.3)
Monocytes Absolute Automated: 0.77 10*3/uL (ref 0.00–1.20)
Monocytes: 11.6 %
Neutrophils Absolute: 2.73 10*3/uL (ref 1.80–8.10)
Neutrophils: 41.1 %
Nucleated RBC: 0 /100 WBC (ref 0.0–1.0)
Platelets: 263 10*3/uL (ref 140–400)
RBC: 4.72 10*6/uL (ref 4.70–6.00)
RDW: 15 % (ref 12–15)
WBC: 6.62 10*3/uL (ref 3.50–10.80)

## 2016-12-14 LAB — HEPATIC FUNCTION PANEL
ALT: 37 U/L (ref 0–55)
AST (SGOT): 31 U/L (ref 5–34)
Albumin/Globulin Ratio: 1.6 (ref 0.9–2.2)
Albumin: 4.6 g/dL (ref 3.5–5.0)
Alkaline Phosphatase: 80 U/L (ref 38–106)
Bilirubin Direct: 0.4 mg/dL (ref 0.0–0.5)
Bilirubin Indirect: 0.6 mg/dL (ref 0.0–1.0)
Bilirubin, Total: 1 mg/dL (ref 0.1–1.2)
Globulin: 2.8 g/dL (ref 2.0–3.7)
Protein, Total: 7.4 g/dL (ref 6.0–8.3)

## 2016-12-14 LAB — PT/INR
PT INR: 1 (ref 0.9–1.1)
PT: 13.4 s (ref 12.6–15.0)

## 2016-12-14 LAB — AFP TUMOR MARKER: Alpha-Fetoprotein: 3.4 ng/mL (ref 0.9–8.8)

## 2016-12-14 LAB — HEMOLYSIS INDEX: Hemolysis Index: 9 (ref 0–18)

## 2016-12-18 ENCOUNTER — Inpatient Hospital Stay: Payer: BLUE CROSS/BLUE SHIELD | Admitting: Physical Therapist

## 2016-12-28 ENCOUNTER — Encounter (INDEPENDENT_AMBULATORY_CARE_PROVIDER_SITE_OTHER): Payer: Self-pay

## 2017-01-16 ENCOUNTER — Inpatient Hospital Stay: Payer: BLUE CROSS/BLUE SHIELD | Attending: Orthopaedic Surgery | Admitting: Physical Therapist

## 2017-01-16 DIAGNOSIS — M545 Low back pain: Secondary | ICD-10-CM | POA: Insufficient documentation

## 2017-01-16 DIAGNOSIS — M5440 Lumbago with sciatica, unspecified side: Secondary | ICD-10-CM

## 2017-01-16 DIAGNOSIS — M25561 Pain in right knee: Secondary | ICD-10-CM | POA: Insufficient documentation

## 2017-01-16 NOTE — PT/OT Therapy Note (Signed)
Name: CHEE KINSLOW Age: 66 y.o.   Referring Physician: Harriet Masson, MD   Date of Injury: 12/16/2016  Date Care Plan Established/Reviewed: 01/16/2017  Date Treatment Started: 01/16/2017  Visit Count: 1   Diagnosis:   1. Acute midline low back pain with sciatica, sciatica laterality unspecified    2. Anterior knee pain, right        Subjective     History of Present Illness   Functional Limitations (PLOF): Patient with pain and difficulty lifting grocery bags, dressing/ bathing activities, stair mobility, walking >10 5 minutes    Pain   I am here for my R knee and low back pain.  About a month ago I had to carry  my 70 pound dog up the stairs, afterwards, my L knee and glut started to hurt where the battery is.  When I walk it feels like I am holding 100 pounds, everything is an effort to move.  I felt a sharp pain around the battery in my L glut, like I tore something around it.  I'm still getting the stimulation from the unit, that seems to still be working fine.    Location: Bilateral knees L>R, L glut/ sacrum  Quality: L knee twinges across knee cap, soreness/ burning in L glut  Aggravators: going up stairs, walking, lifting        Social Support/Occupation  Lives in: multiple level home  Lives with: spouse  Occupation: retired    Psychologist, prison and probation services   Sit to Stand: independent  Squat: full    Balance   Left   Eyes Open (Left)(sec)      Trial 1: 20 seconds  Right   Eyes Open (Right)(sec)      Trial 1: 30 seconds    Range of Motion        Left AROM    Left PROM   Lumbar/Hip    Right AROM    Right PROM       Lumbar Rotation       90    Hip Flexion           Hip Extension           Hip Abduction           Hip Adduction       0 pain   Hip IR 5      10 pain   Hip ER 1-      (blank fields were intentionally left blank)       Left AROM    Left PROM   Knee    Right AROM    Right PROM    WFL   Flexion 120       WFL   Extension  WFL     (blank fields were intentionally left blank)  Lumbar flexion :  Fingertips 6 inches from floor with bilateral knee flexion  SB bilaterally with onset of pain lumbar spine and glutes  Extension [facility]    Strength        Left Strength  Hip      Right   3+  Hip Flexion 3+    3+ pain Hip Extension 3+      Hip Abduction       Hip Adduction     3+  Hip IR 3+    3+  Hip ER 3+    3+  Quadriceps 4      Hamstrings     (blank  fields were intentionally left blank)       Left Strength  Ankle/Foot      Right   5  Ankle Dorsiflexion 5    5  Ankle Plantarflexion 5      Ankle Inversion       Ankle Eversion     (blank fields were intentionally left blank)    Palpation Lumbar paraspinals L>R in spasm  and TTP L glut/ piriformis    Flexibility SLR to 40 degrees bilaterally     Neurological Testing     Sensation     Hip   Left Hip   Diminished: light touch    Right Hip   Diminished: light touch    Superior displacement of sacrum with LTR FM in prone             Treatment     Therapeutic Exercises   L grasshopper FMs    Manual Therapy   BC lumbar sp's, lumbar spinal grooves bilaterally  Clearing around battery unit L glut  STMobs to L glut, cluneal nerves         Assessment   Laden is a 66 y.o. male presenting with low back and bilateral knee pain who requires Physical Therapy for the following:  Impairments:   Pain that limits and interferes with functional ability.   Impaired postural alignment.  Decreased range of motion of the lumbar spine and hip  Decreased strength of the core and hip  Decreased functional stability of the core  Decreased joint mobility of the lumbar spine  Decreased soft tissue mobility of lumbar spine  Decreased/impaired motor control during hip hinging   Pain that limits and interferes with functional ability.   Impaired biomechanical alignment.  Decreased range of motion of the knee  Decreased strength of hip and knee  Decreased functional stability of the hip and core  Decreased joint mobility of the patellofemoral joint  Decreased soft tissue mobility of hip and  thigh  Impaired gait mechanics.    Decreased/impaired motor control during squatting    Pain located: low back, bilateral LE's    Clinical presentation: unstable with ADL and positioning  Barriers to therapy: Past surgical history lumbar fusions, placement of stimulator, knee surgery   Prior Level of Function: Patient with pain and difficulty lifting grocery bags, dressing/ bathing activities, stair mobility, walking >10 5 minutes  Prognosis: excellent  Plan   Visits per week: 2  Number of Sessions: 16  Direct One on One  54098: Therapeutic Exercise: To Develop Strength and Endurance, ROM and Flexibility  [id number]: Gait Training  11914: Neuromuscular Reeducation  [phone]: Self Care/Home Mgmt Training (ADLs, safety procedures, use of assistive devices)  97140: Manual Therapy techniques (mobilization, manipulation, manual traction) (bilateral LE's, pelvic girdle, thoracic and lumbar spine to address regionally interdependent dysfunctions)  97530: Therapeutic Activities: Dynamic activities to improve functional performance  Dry Needling  NEEDS LUMBAR SPINE FOTO SURVEY      Goals    Goal 1:  Patient will improve score on Knee and lumbar FOTO by recommended score to demonstrate improved function (specific # to be updated once lumbar FOTO survey completed)    Sessions:  16      Goal 2:  Patient will demonstrate improved lumbopelvic mobility to facilitatehip hinge squat with neutral trunk and T-rex arms to keep load close to lift 10 lbs from knees to chest without c/o pain to increase safety with lifting for ADL's   Sessions:  16  Goal 3:  Pt will report ability to amb with symmetrical gait pattern without AD x 30 mins at a time to ease ADL performance daily   Sessions:  [address]                                   [name], PT

## 2017-01-20 DIAGNOSIS — M25561 Pain in right knee: Secondary | ICD-10-CM | POA: Insufficient documentation

## 2017-01-20 DIAGNOSIS — M5440 Lumbago with sciatica, unspecified side: Secondary | ICD-10-CM | POA: Insufficient documentation

## 2017-01-20 NOTE — Progress Notes (Signed)
Name: Kent Jordan Age: 66 y.o.   Referring Physician: Harriet Masson, MD   Date of Injury: 12/16/2016  Date Care Plan Established/Reviewed: 01/16/2017  Date Treatment Started: 01/16/2017  Visit Count: 1   Diagnosis:   1. Acute midline low back pain with sciatica, sciatica laterality unspecified    2. Anterior knee pain, right                                  Goals    Goal 1:  Patient will improve score on Knee and lumbar FOTO by recommended score to demonstrate improved function (specific # to be updated once lumbar FOTO survey completed)    Sessions:  16      Goal 2:  Patient will demonstrate improved lumbopelvic mobility to facilitatehip hinge squat with neutral trunk and T-rex arms to keep load close to lift 10 lbs from knees to chest without c/o pain to increase safety with lifting for ADL's   Sessions:  16      Goal 3:  Pt will report ability to amb with symmetrical gait pattern without AD x 30 mins at a time to ease ADL performance daily   Sessions:  16                                   [name], PT

## 2017-01-29 ENCOUNTER — Inpatient Hospital Stay: Payer: BLUE CROSS/BLUE SHIELD | Attending: Orthopaedic Surgery | Admitting: Physical Therapist

## 2017-01-29 DIAGNOSIS — M5440 Lumbago with sciatica, unspecified side: Secondary | ICD-10-CM | POA: Insufficient documentation

## 2017-01-29 NOTE — PT/OT Therapy Note (Signed)
Name: LAYKEN DOENGES Age: 66 y.o.   Referring Physician: Harriet Masson, MD   Date of Injury: 12/16/2016  Date Care Plan Established/Reviewed: 01/16/2017  Date Treatment Started: 01/16/2017  Visit Count: 2   Diagnosis:   1. Acute midline low back pain with sciatica, sciatica laterality unspecified        Subjective     Pain   Feeling better, I just went up 4 flights of stairs.  I am still having a problem at the battery ( L glut) and in the (LQL) .  Occasionally I have pain going down the L leg.      Social Support/Occupation  Lives in: multiple level home  Lives with: spouse  Occupation: retired                Building surveyor   LTR to facilitate lumbopelvic mobility above and below fusion    Neuromuscular Re-Education   Justification: To facilitate core and hip stability   Unilateral ab series PHs position 1   RS in hooklying position     Manual Therapy   Justification: To improve pelvic mobility around fusion  BC L iliac crest  Scar mobs with cupping- central lumbar spine  Cupping to sacral superficial fascia  Rib mobs with L scap AE/PD and pelvic AE/PD  BC L lumbar and thoracic spinal grooves with prone LTR  STMobs to L thoracic and lumbar paraspinals         Assessment   Improved ribcage mobility, patient able to perform sit to stand transfers, bed mobility, and ambulation with significant decrease in pain at end of session today.  Continue to improve lumbopelvic mobility to improve transfers and gait mechanics.   Plan   Continue per POC, see assessment      Goals    Goal 1:  Patient will improve score on Knee and lumbar FOTO by recommended score to demonstrate improved function (specific # to be updated once lumbar FOTO survey completed)    Sessions:  16      Goal 2:  Patient will demonstrate improved lumbopelvic mobility to facilitatehip hinge squat with neutral trunk and T-rex arms to keep load close to lift 10 lbs from knees to chest without c/o pain to increase safety with lifting for  ADL's   Sessions:  16      Goal 3:  Pt will report ability to amb with symmetrical gait pattern without AD x 30 mins at a time to ease ADL performance daily   Sessions:  16                                   [name], PT

## 2017-01-31 ENCOUNTER — Inpatient Hospital Stay: Payer: BLUE CROSS/BLUE SHIELD | Attending: Orthopaedic Surgery | Admitting: Physical Therapist

## 2017-01-31 DIAGNOSIS — M25561 Pain in right knee: Secondary | ICD-10-CM

## 2017-01-31 DIAGNOSIS — M5440 Lumbago with sciatica, unspecified side: Secondary | ICD-10-CM | POA: Insufficient documentation

## 2017-01-31 NOTE — PT/OT Therapy Note (Signed)
Name: Kent Jordan Age: 66 y.o.   Referring Physician: Harriet Masson, MD   Date of Injury: 12/16/2016  Date Care Plan Established/Reviewed: 01/16/2017  Date Treatment Started: 01/16/2017  Visit Count: 3   Diagnosis:   1. Acute midline low back pain with sciatica, sciatica laterality unspecified    2. Anterior knee pain, right        Subjective     Pain   I feel pretty good after last session,  I was even able to walk the dog.  Yesterday, I did some gentle twisting to loosen up my back throughout the walk, since it is looser I want to keep things moving.      Social Support/Occupation  Lives in: multiple level home  Lives with: spouse  Occupation: retired    Objective     L SI joint mobility:   Hard endfeel L inominate gapping and approximation at [employer] joint             Treatment     Neuromuscular Re-Education   PH's ab series  L pelvic AE end range holds to facilitate core, progressed to COI    Manual Therapy   BC R lumbar and thoracic spinal grooves   BC R iliac crest L and R  BC L 12 rib inferior border  Inferior glide L inominate   Prone:  L SI gapping and approximation mobs grade 4   L inominate  IR mob in SL grade 4 with basking seal FM  STMobs to L iliacus with free ball FM  L inominate grade 4 ER mob with free ball FM  L inominate flexion mobs grade 4 with CR         Assessment   Hypomobility at L SI joint prompted joint mobilization to improve L inominate mobility.  Patient reports increase ease with bed mobilities, sit to stand transfers at end of session today.  Improving function with painfree walking.  Continue to improve bilateral pelvic AE/PD to improve gait mechanics  Prognosis: excellent  Plan   Continue per POC  R ribcage mobility   FOTO      Goals    Goal 1:  Patient will improve score on Knee and lumbar FOTO by recommended score to demonstrate improved function (specific # to be updated once lumbar FOTO survey completed)    Sessions:  16      Goal 2:  Patient will demonstrate improved lumbopelvic  mobility to facilitatehip hinge squat with neutral trunk and T-rex arms to keep load close to lift 10 lbs from knees to chest without c/o pain to increase safety with lifting for ADL's  01/29/17 Progressing with ST mobility to improve hip mobility for squats. MG   Sessions:  16   Progression:  progressing       Goal 3:  Pt will report ability to amb with symmetrical gait pattern without AD x 30 mins at a time to ease ADL performance daily  01/29/17 Patient reporting improving ambulation distances ad lib throughout community with severe increase in L QL/ hip pain MG   Sessions:  16   Progression:  progressing                                   [name], PT

## 2017-02-05 ENCOUNTER — Inpatient Hospital Stay: Payer: BLUE CROSS/BLUE SHIELD | Attending: Orthopaedic Surgery | Admitting: Physical Therapist

## 2017-02-05 ENCOUNTER — Telehealth (INDEPENDENT_AMBULATORY_CARE_PROVIDER_SITE_OTHER): Payer: Self-pay | Admitting: Student in an Organized Health Care Education/Training Program

## 2017-02-05 DIAGNOSIS — M5440 Lumbago with sciatica, unspecified side: Secondary | ICD-10-CM | POA: Insufficient documentation

## 2017-02-05 DIAGNOSIS — E119 Type 2 diabetes mellitus without complications: Secondary | ICD-10-CM

## 2017-02-05 NOTE — Telephone Encounter (Signed)
The patient came by the office and would like to get a refill on FREESTYLE LITE test strip (Order [id number]).  Do he need to make an appointment or will you just send to [facility]?   [phone]

## 2017-02-05 NOTE — PT/OT Therapy Note (Signed)
Name: ZEPPELIN COMMISSO Age: 66 y.o.   Referring Physician: Harriet Masson, MD   Date of Injury: 12/16/2016  Date Care Plan Established/Reviewed: 01/16/2017  Date Treatment Started: 01/16/2017  Visit Count: 4   Diagnosis:   1. Acute midline low back pain with sciatica, sciatica laterality unspecified        Subjective     Pain   I'm feeling better.  I did a lot of walking after last session and then I felt pretty good the next day.  I feel looser in my back, and I feel like I can walk more.        Social Support/Occupation  Lives in: multiple level home  Lives with: spouse  Occupation: retired                Building surveyor   Justification: To improve stability/ strength  March with LE extension alternating x 20 reps.  Copy given and added to HEP.      Neuromuscular Re-Education   Justification: To re-ed in new range  L pelvic AE end range holds then COI  L pelvic PD end range holds then COI    Manual Therapy   Justification: To improve mobility   STMobs to L lats, bilateral thoracic/ lumbar paraspinals  BC to L L3-L5, iliac crest, spinous processes  Inferior glide L inominate grade 4  L SI gapping then approximation mobs grade 4 with CR         Assessment   Patient progressing with ambulation in community, added NMR and stabilization to improve carryover and facilitate higher level activities.    Plan   Continue per POC  LE strengthening  Pelvic NMR bilaterally  Knee and lumbar FOTO       Goals    Goal 1:  Patient will improve score on Knee and lumbar FOTO by recommended score to demonstrate improved function (specific # to be updated once lumbar FOTO survey completed)    Sessions:  16      Goal 2:  Patient will demonstrate improved lumbopelvic mobility to facilitatehip hinge squat with neutral trunk and T-rex arms to keep load close to lift 10 lbs from knees to chest without c/o pain to increase safety with lifting for ADL's  01/29/17 Progressing with ST mobility to improve hip mobility for squats. MG    Sessions:  16   Progression:  progressing       Goal 3:  Pt will report ability to amb with symmetrical gait pattern without AD x 30 mins at a time to ease ADL performance daily  01/29/17 Patient reporting improving ambulation distances ad lib throughout community with severe increase in L QL/ hip pain MG   Sessions:  16   Progression:  progressing                                   [name], PT

## 2017-02-06 MED ORDER — FREESTYLE LITE TEST VI STRP
1.0000 | ORAL_STRIP | Freq: Two times a day (BID) | 2 refills | Status: DC
Start: 2017-02-06 — End: 2019-07-17

## 2017-02-06 NOTE — Telephone Encounter (Signed)
Order has been sent, he still need appointment for follow up of his diabetes.

## 2017-02-06 NOTE — Addendum Note (Signed)
Addended by: Erasmo Score on: 02/06/2017 01:13 PM     Modules accepted: Orders

## 2017-02-10 NOTE — Addendum Note (Signed)
Addended by: [name] on: 02/10/2017 05:38 PM     Modules accepted: Orders

## 2017-02-14 ENCOUNTER — Inpatient Hospital Stay: Payer: BLUE CROSS/BLUE SHIELD | Attending: Orthopaedic Surgery | Admitting: Physical Therapist

## 2017-02-14 DIAGNOSIS — M5440 Lumbago with sciatica, unspecified side: Secondary | ICD-10-CM | POA: Insufficient documentation

## 2017-02-14 NOTE — Progress Notes (Signed)
Name: Kent Jordan Age: 66 y.o.   Referring Physician: Harriet Masson, MD   Date of Injury: 12/16/2016  Date Care Plan Established/Reviewed: 01/16/2017  Date Treatment Started: 01/16/2017  Visit Count: 5   Diagnosis:   1. Acute low back pain with sciatica, sciatica laterality unspecified, unspecified back pain laterality        Subjective     Pain   Went to my daughter's wedding this weekend. From thursday to sunday I did tons of standing and sitting in bad chairs. I am hurting today .  Walking is  no problem, standing still just kills me.   My R glut is painful and it radiates down the outside of my thigh to the back of the knee.      Social Support/Occupation  Lives in: multiple level home  Lives with: spouse  Occupation: retired                Building surveyor   LTR bilaterally x 30 reps    Neuromuscular Re-Education   End range holds R hip flexion  Ab series position 1: PHs x 30 secs x 3 reps      Manual Therapy   STMobs to R superior glut/ cluneal nerves  BC R ILA, post GT   STMobs to vastus lateralis, TFL with R LE IR  AP grade 4 to femoral head with R LE IR   STMobs to R hamstring/ neural tracing with knee flexion/ extension in hooklying         Assessment   Significantly limited R hip IR/ER ROM and pain in the R gluteals prompted STMobs to lateral and posterior hip structures as well as joint mobilizations to improve hypomobility. Following treatment, patient reports decreased pain in standing and sitting at end of session, radicular pain abolished at end of session.  Further treatments should continue to improve hip mobility through soft tissue and joint mobilization and incorporate NMR to increase control of motion in new ranges.    Plan   Joint and STMobilization to promote IR/ER of hip  NMR to encourage core recruitment, control of motion in end range and maintenance of sitting and standing positions      Goals    Goal 1:  Patient will improve score on Knee and lumbar FOTO by  recommended score to demonstrate improved function (specific # to be updated once lumbar FOTO survey completed)    Sessions:  16   Progression:  progressing      Goal 2:  Patient will demonstrate improved lumbopelvic mobility to facilitatehip hinge squat with neutral trunk and T-rex arms to keep load close to lift 10 lbs from knees to chest without c/o pain to increase safety with lifting for ADL's  02/05/17 Progressing with ST and joint mobility to improve hip mobility for squats. MG   Sessions:  16   Progression:  progressing       Goal 3:  Pt will report ability to amb with symmetrical gait pattern without AD x 30 mins at a time to ease ADL performance daily  02/05/17 Patient reporting improving ambulation distances ad lib throughout community without severe increase in L QL/ hip pain MG, pain occurs afterwards   Sessions:  16   Progression:  progressing                                   [employer]  Arline Asp, PT

## 2017-02-21 ENCOUNTER — Inpatient Hospital Stay: Payer: BLUE CROSS/BLUE SHIELD | Attending: Orthopaedic Surgery | Admitting: Physical Therapist

## 2017-02-21 DIAGNOSIS — M5440 Lumbago with sciatica, unspecified side: Secondary | ICD-10-CM | POA: Insufficient documentation

## 2017-02-21 NOTE — PT/OT Therapy Note (Signed)
Name: KANE KUSEK Age: 66 y.o.   Referring Physician: Harriet Masson, MD   Date of Injury: 12/16/2016  Date Care Plan Established/Reviewed: 01/16/2017  Date Treatment Started: 01/16/2017  Visit Count: 6   Diagnosis:   1. Acute right-sided low back pain with sciatica, sciatica laterality unspecified        Subjective     Pain   I had an accident last Friday, I fell in a hole walking the dog, I fell and hyperflexed the R knee, it's still swollen, lateral knee pain.  I'm feeling better.  My knee is really tight now.    LBP: 4/5  R knee pain: 5/5.  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   Negative valgus stress test at 30 degrees, varus stress test at 0 degrees and varus stress test at 30 degrees.     Lateral collateral stress test positive for slight laxity at 0 degrees flexion             Treatment     Therapeutic Exercises   SKTC: R then L with verbal cues to relax posterior hip vs tensing and lifting off table x 5 reps each leg    Manual Therapy   STMobs to R lateral quad, circumfrential mobs with heel slide FM.  Prone:  STMobs to skin/superficial fascial lumbar spine, lumbar paraspinals, gluteals, QL, cluneal nerves   PA grade 4 to R then L to improve on axis position at end range ER     Therapeutic Activity   Supine to sit transfer training for sequencing       ---      ---   Total Time   Timed Minutes  40 minutes   Total Time  40 minutes        Assessment   Pt's condition has experienced a slight setback due to a recent injury, with increased pain and slight swelling to the R knee. Some lateral ligamentous laxity of the R knee suggests that the patient continues to  need strengthening of the structures supporting the knee. The pt also demonstrated an increased presence of trigger points in the low back, gluteals: addressed with STMobs and added SKTC stretch to lengthen posterior hip structures.  Continue to assess and improve inominate mobility on sacrum to improve transfers and walking.    Plan   Continue per POC- see assessment      Goals    Goal 1:  Patient will improve score on Knee and lumbar FOTO by recommended score to demonstrate improved function (specific # to be updated once lumbar FOTO survey completed)    Sessions:  16   Progression:  progressing      Goal 2:  Patient will demonstrate improved lumbopelvic mobility to facilitatehip hinge squat with neutral trunk and T-rex arms to keep load close to  lift 10 lbs from knees to chest without c/o pain to increase safety with lifting for ADL's  02/05/17 Progressing with ST and joint mobility to improve hip mobility for squats. MG   Sessions:  16   Progression:  progressing       Goal 3:  Pt will report ability to amb with symmetrical gait pattern without AD x 30 mins at a time to ease ADL performance daily  02/05/17 Patient reporting improving ambulation distances ad lib throughout community without severe increase in L QL/ hip pain MG, pain occurs afterwards   Sessions:  16   Progression:  progressing                                   [name], PT

## 2017-02-27 ENCOUNTER — Inpatient Hospital Stay: Payer: BLUE CROSS/BLUE SHIELD | Attending: Orthopaedic Surgery | Admitting: Physical Therapist

## 2017-02-27 DIAGNOSIS — M5440 Lumbago with sciatica, unspecified side: Secondary | ICD-10-CM | POA: Insufficient documentation

## 2017-02-27 NOTE — PT/OT Therapy Note (Signed)
Name: Kent Jordan Age: 66 y.o.   Referring Physician: Harriet Masson, MD   Date of Injury: 12/16/2016  Date Care Plan Established/Reviewed: 01/16/2017  Date Treatment Started: 01/16/2017  Visit Count: 7   Diagnosis:   1. Acute bilateral low back pain with sciatica, sciatica laterality unspecified        Subjective     Social Support/Occupation  Lives in: multiple level home  Lives with: spouse  Occupation: retired    Every joint in my body hurts. My knee hurts, my back is killing me and pain is shooting down both sides. I am having a psoriasis flare. The psoriasis doesn't show so much on my skin but really affects my joints. I tried walking yesterday and got 10k steps in, but it was a struggle. Today my pain is at a 6 or 7.     Objective   LPM: R flexion 2; L ext Sluggish 2; L Flexion 2; R ext 2             Treatment     Neuromuscular Re-Education   Core engagement with self-resisted hip flexion; performed unilaterally on L and R    Manual Therapy   Prone:  BC R and L iliac crest  STMobs to B QL, bilateral paraspinals, R glutes  Visceral mobilization all quadrants of abdomen with and without Lateral trunk rotation         Assessment   Pt experienced a decline in function this week as a result of a psoriatic arthritis flare. Pt's painful restrictions were more superficial in nature, with the fascia over the QL and just lateral to the ILA of the sacrum most TTP. High pain levels precluded significant strength work and, as a result, the majority of today's treatment was geared towards decreasing the patient's pain. Pending resolution of this flare, next session should focus on LE strengthening and core initiation during transitional movements, including supine to sit and sit to stand transfers.   Plan   LE Strengthening  NMR for core integration in functional patterns      Goals    Goal 1:  Patient will improve score on Knee and lumbar FOTO by recommended score to demonstrate improved function (specific # to be  updated once lumbar FOTO survey completed)    Sessions:  16   Progression:  progressing      Goal 2:  Patient will demonstrate improved lumbopelvic mobility to facilitatehip hinge squat with neutral trunk and T-rex arms to keep load close to lift 10 lbs from knees to chest without c/o pain to increase safety with lifting for ADL's  02/05/17 Progressing with ST and joint mobility to improve hip mobility for squats. MG   Sessions:  16   Progression:  progressing       Goal 3:  Pt will report ability to amb with symmetrical gait pattern without AD x 30 mins at a time to ease ADL performance daily  02/05/17 Patient reporting improving ambulation distances ad lib throughout community without severe increase in L QL/ hip pain MG, pain occurs afterwards   Sessions:  16   Progression:  progressing                                   [name], PT

## 2017-03-05 ENCOUNTER — Ambulatory Visit (INDEPENDENT_AMBULATORY_CARE_PROVIDER_SITE_OTHER): Payer: Self-pay | Admitting: Cardiovascular Disease

## 2017-03-07 ENCOUNTER — Inpatient Hospital Stay: Payer: BLUE CROSS/BLUE SHIELD | Attending: Orthopaedic Surgery | Admitting: Physical Therapist

## 2017-03-07 DIAGNOSIS — M5440 Lumbago with sciatica, unspecified side: Secondary | ICD-10-CM

## 2017-03-07 NOTE — PT/OT Therapy Note (Signed)
Name: Kent Jordan Age: 66 y.o.   Referring Physician: Harriet Masson, MD   Date of Injury: 12/16/2016  Date Care Plan Established/Reviewed: 01/16/2017  Date Treatment Started: 01/16/2017  Visit Count: 8   Diagnosis:   1. Acute right-sided low back pain with sciatica, sciatica laterality unspecified        Subjective     Pain   I'm feeling a lot better than last week. My knee is bothering me. I rode my bike today; my knee is stiff. My back is the same way it's always been, but better than last week. I am in some pain because my dog pulled me off a curb and I fell on my knee.     Social Support/Occupation  Lives in: multiple level home  Lives with: spouse  Occupation: retired                Building surveyor   Discussed soft tissue mobilization with patient using lacrosse ball for low back to encourage QL lengthening  HISL test to assess mobility of hip, pelvis, sacrum, and low back    Neuromuscular Re-Education   Justification: To improve pain, muscle length, and weight acceptance during ambulation  L sidelying:   R pelvic AE end range hold progressing to COI  R pelvic PD end range hold  Attempted basking seal with support to LEs; discontinued after 6 repetitions due to pt pain and muscle spasm      Manual Therapy   STMobs to R glut med, hamstring, ITB, TFL, sartorius/quad insertion, inguinal ligament  BC iliac crest, paraspinal groove         Assessment   Pt presented today with a much lower pain level than the previous session; however, pt still experienced several muscular spasms and sensitivity to movement than in previous weeks. This prompted today's session to focus on treating taut bands and immobile myofascial structures in and around the hip, low back, and thigh. NMR was utilized to increase length of QL and improve ambulation. While pt reports higher pain during the session, post session, pain levels improve. Next session should incorporate more PNF and general movement to improve pt's  endurance, core initiation, and stability work to improve reported lack of endurance and balance, which have resulted in recent falls.   Plan   PNF patterns for core incorporation   Balance and stability exercises  Some manual therapy for trigger points, muscle lengthening.       Goals    Goal 1:  Patient will improve score on Knee and lumbar FOTO by recommended score to demonstrate improved function (specific # to be updated once lumbar FOTO survey completed)    Sessions:  16   Progression:  progressing      Goal 2:  Patient will demonstrate improved lumbopelvic mobility to facilitatehip hinge squat with neutral trunk and T-rex arms to keep load close to lift 10 lbs from knees to chest without c/o pain to increase safety with lifting for ADL's  02/05/17 Progressing with ST and joint mobility to improve hip mobility for squats. MG   Sessions:  16   Progression:  progressing       Goal 3:  Pt will report ability to amb with symmetrical gait pattern without AD x 30 mins at a time to ease ADL performance daily  02/05/17 Patient reporting improving ambulation distances ad lib throughout community without severe increase in L QL/ hip pain MG, pain occurs afterwards  Sessions:  16   Progression:  progressing                                   [name], PT

## 2017-03-12 ENCOUNTER — Ambulatory Visit (INDEPENDENT_AMBULATORY_CARE_PROVIDER_SITE_OTHER): Payer: BLUE CROSS/BLUE SHIELD | Admitting: Student in an Organized Health Care Education/Training Program

## 2017-03-12 ENCOUNTER — Encounter (INDEPENDENT_AMBULATORY_CARE_PROVIDER_SITE_OTHER): Payer: Self-pay | Admitting: Student in an Organized Health Care Education/Training Program

## 2017-03-12 VITALS — BP 126/78 | HR 66 | Temp 97.6°F | Resp 17 | Ht 70.5 in | Wt 192.0 lb

## 2017-03-12 DIAGNOSIS — I10 Essential (primary) hypertension: Secondary | ICD-10-CM

## 2017-03-12 DIAGNOSIS — E119 Type 2 diabetes mellitus without complications: Secondary | ICD-10-CM

## 2017-03-12 DIAGNOSIS — E782 Mixed hyperlipidemia: Secondary | ICD-10-CM

## 2017-03-12 LAB — CBC
Absolute NRBC: 0 10*3/uL
Hematocrit: 45.6 % (ref 42.0–52.0)
Hgb: 15.3 g/dL (ref 13.0–17.0)
MCH: 31.9 pg (ref 28.0–32.0)
MCHC: 33.6 g/dL (ref 32.0–36.0)
MCV: 95 fL (ref 80.0–100.0)
MPV: 9.3 fL — ABNORMAL LOW (ref 9.4–12.3)
Nucleated RBC: 0 /100 WBC (ref 0.0–1.0)
Platelets: 269 10*3/uL (ref 140–400)
RBC: 4.8 10*6/uL (ref 4.70–6.00)
RDW: 13 % (ref 12–15)
WBC: 7.92 10*3/uL (ref 3.50–10.80)

## 2017-03-12 LAB — COMPREHENSIVE METABOLIC PANEL
ALT: 47 U/L (ref 0–55)
AST (SGOT): 37 U/L — ABNORMAL HIGH (ref 5–34)
Albumin/Globulin Ratio: 1.6 (ref 0.9–2.2)
Albumin: 4.6 g/dL (ref 3.5–5.0)
Alkaline Phosphatase: 92 U/L (ref 38–106)
BUN: 23 mg/dL (ref 9.0–28.0)
Bilirubin, Total: 0.9 mg/dL (ref 0.1–1.2)
CO2: 26 mEq/L (ref 21–29)
Calcium: 9.9 mg/dL (ref 8.5–10.5)
Chloride: 101 mEq/L (ref 100–111)
Creatinine: 1 mg/dL (ref 0.5–1.5)
Globulin: 2.9 g/dL (ref 2.0–3.7)
Glucose: 109 mg/dL — ABNORMAL HIGH (ref 70–100)
Potassium: 4.9 mEq/L (ref 3.5–5.1)
Protein, Total: 7.5 g/dL (ref 6.0–8.3)
Sodium: 137 mEq/L (ref 136–145)

## 2017-03-12 LAB — HEMOLYSIS INDEX: Hemolysis Index: 17 (ref 0–18)

## 2017-03-12 LAB — LIPID PANEL
Cholesterol / HDL Ratio: 2.4
Cholesterol: 171 mg/dL (ref 0–199)
HDL: 72 mg/dL (ref 40–9999)
LDL Calculated: 71 mg/dL (ref 0–99)
Triglycerides: 139 mg/dL (ref 34–149)
VLDL Calculated: 28 mg/dL (ref 10–40)

## 2017-03-12 LAB — HEMOGLOBIN A1C
Average Estimated Glucose: 99.7 mg/dL
Hemoglobin A1C: 5.1 % (ref 4.6–5.9)

## 2017-03-12 LAB — MICROALBUMIN, RANDOM URINE
Urine Creatinine, Random: 104.2 mg/dL
Urine Microalbumin, Random: 7 (ref 0.0–30.0)
Urine Microalbumin/Creatinine Ratio: 7 ug/mg (ref 0–30)

## 2017-03-12 LAB — GFR: EGFR: >60

## 2017-03-12 MED ORDER — EZETIMIBE 10 MG PO TABS
10.0000 mg | ORAL_TABLET | Freq: Every day | ORAL | 1 refills | Status: DC
Start: 2017-03-12 — End: 2017-09-05

## 2017-03-12 MED ORDER — METFORMIN HCL 500 MG PO TABS
500.0000 mg | ORAL_TABLET | Freq: Every morning | ORAL | 1 refills | Status: DC
Start: 2017-03-12 — End: 2017-08-23

## 2017-03-12 MED ORDER — FREESTYLE LANCETS MISC
1.0000 | Freq: Two times a day (BID) | 11 refills | Status: DC
Start: 2017-03-12 — End: 2019-07-17

## 2017-03-12 MED ORDER — ROSUVASTATIN CALCIUM 5 MG PO TABS
5.0000 mg | ORAL_TABLET | Freq: Every day | ORAL | 1 refills | Status: DC
Start: 2017-03-12 — End: 2017-09-05

## 2017-03-12 NOTE — Progress Notes (Signed)
Have you seen any specialists/other providers since your last visit with [place]?            Arm preference verified?     Yes    The patient is due for various

## 2017-03-12 NOTE — Progress Notes (Signed)
Patient ID: Kent Jordan  is a 66 y.o.  male.     Chief Complaint   Patient presents with   . Hypertension     needs refill on Zetia, Metformin, Crestor, Lancets      HPI    Problem   Type 2 Diabetes Mellitus Without Complication, Without Long-Term Current Use of Insulin      Medication(s): Metformin 500mg  daily  Side effects: none  Hypoglycemic episodes: none  Neuropathic Sx: no, follows with podiatry  Eye exam in the past year: April 2018, no retinopathy, follows with Dr. Jon Billings  PNA vaccine:~2016  Last urine microalbumin:    Review of labs showed:  Lab Results   Component Value Date    HGBA1C 6.3 (H) 06/29/2016    HGBA1C 7.1 (H) 05/30/2016    HGBA1C 6.9 (H) 05/24/2016          Mixed Hyperlipidemia    Medication(s): Zetia 10mg , Crestor 5mg   Side effects: Crestor causes rash at higher doses  71yr CVD risk:  DM2, HTN, former smoker; statin benefit    Denies CP/dyspnea/dizziness/HA/orthopnea/edema   Counseled on diet and exercise    Review of lipid panel shows:  Lab Results   Component Value Date    CHOL 149 05/30/2016    CHOL 131 10/06/2015     Lab Results   Component Value Date    TRIG 115 05/30/2016    TRIG 103 10/06/2015      Lab Results   Component Value Date    HDL 38 (L) 05/30/2016    HDL 38 (L) 10/06/2015     Lab Results   Component Value Date    LDL 88 05/30/2016    LDL 72 10/06/2015          Essential Hypertension    Goal BP < 140/90  controlled  Home BP: 120s/80s  Medications: amlodipine 10mg , HCTZ 25mg    Side Effects: no (avoid ARBs and ACEi d/t hyperkalemia and angioedema)  Diet/exercise: walking regularly  Smoking: quit 5 years ago, 35 pack year history  Symptoms: denies CP/SOB/dizziness/HA  Follows with cardiology    BP Readings from Last 3 Encounters:   03/12/17 126/78   12/07/16 125/79   10/24/16 125/80                The following portions of the patient's history were reviewed and updated as appropriate: current medications, allergies, past family history, past medical history, past social  history, past surgical history and problem list.     PAST MEDICAL HISTORY:  Past Medical History:   Diagnosis Date   . Arrhythmia     when on ARB   . Arthritis     psoriatic and osteoarthritis    . Back pain    . Blood disorder 11/2014    Hemochromatosis   . Diabetes mellitus 2016    started metformin 05/2016 d/t A1C 7.1 - now checking BS 2x/day - average 70-110 in the last week - A1C on 06/29/16 = 6.3 , has also lost 16 lbs since 05/2016   . Difficulty in walking(719.7)     Uses cane to ambulate   . Elevated liver function tests     had abd u/s 07/05/16    . Failed back syndrome    . Fatty liver    . Hemochromatosis     phlebotomy every 3 months - last done 02/2016   . High cholesterol    . Hypertensive disorder 06/2016    norvasc recently increased  to 10 mg daily - now controlled on meds - most recent BP 117/73   . Low back pain    . Lung nodule 2012    benign - was monitored x  3 yrs by Dr. Bernita Raisin [facility]) - signed off from pulmonology - no further f/u required since 2015   . Neuropathy     Tingling left foot, numbness left toes   . Psoriasis    . Psoriatic arthritis     tx w/ enbrel   . S/P insertion of spinal cord stimulator 2015   . Sleep apnea     mild - does not have CPAP - was unable to tolerate, less snoring since turboplasty       PAST SURGICAL HISTORY:  Past Surgical History:   Procedure Laterality Date   . ABDOMINAL SURGERY  03/1971    double hernia repair   . ARTHROPLASTY, KNEE, TOTAL Right 08/20/2016    Procedure: ARTHROPLASTY, KNEE, TOTAL;  Surgeon: Harriet Masson, MD;  Location: [facility];  Service: Orthopedics;  Laterality: Right;  RIGHT TOTAL KNEE REPLACEMENT   . ARTHROSCOPY, KNEE Right 03/21/2016    Procedure: ARTHROSCOPY, KNEE, RIGHT, PARTIAL MEDIAL MENISCECTOMY, PARTIAL SYNOVECTOMY;  Surgeon: Verner Chol, MD;  Location: [facility];  Service: Orthopedics;  Laterality: Right;   . CATARACT EXT.WITH IOL Bilateral 2009   . FRACTURE SURGERY  1993    right middle finger   . INSERTION, SPINAL CORD  STIMULATOR GENERATOR N/A 05/07/2014    Procedure: DORSAL COLUMN STIMULATOR PLACEMENT;  Surgeon: Tera Helper, MD;  Location: [facility];  Service: Neurosurgery;  Laterality: N/A;  DCS PLACEMENT   . KNEE ARTHROCENTESIS  1983    right   . LUMBAR FUSION  10/2011   . MICRODISCECTOMY LUMBAR  06/2011, 07/2011   . SEPTOPLASTY  10/05/2015   . SKIN BIOPSY  11/2015    Negative       FAMILY HISTORY:  Family History   Problem Relation Age of Onset   . Heart disease Mother    . Heart disease Father        SOCIAL HISTORY:  Social History     Social History   . Marital status: Married     Spouse name: N/A   . Number of children: N/A   . Years of education: N/A     Social History Main Topics   . Smoking status: Former Smoker     Packs/day: 1.00     Years: 41.00     Quit date: 09/26/2011   . Smokeless tobacco: Never Used   . Alcohol use 1.2 oz/week     4 Cans of beer per week   . Drug use: No   . Sexual activity: Yes     Partners: Female     Birth control/ protection: Surgical     Other Topics Concern   . None     Social History Narrative   . None       ALLERGIES:  Allergies   Allergen Reactions   . Angiotensin Receptor Blockers Other (See Comments)     ARB combined with Celebrex caused Arrhythmia due to high K+   . Ciprofloxacin Swelling and Anaphylaxis     swelling   . Ace Inhibitors Angioedema   . Gabapentin Other (See Comments)     blurred vision, dizziness       Review of Systems   Constitutional: Negative for appetite change, chills, fatigue, fever and unexpected weight change.  HENT: Negative for congestion, rhinorrhea and sore throat.    Eyes: Negative for visual disturbance.   Respiratory: Negative for cough and shortness of breath.    Cardiovascular: Negative for chest pain and palpitations.   Gastrointestinal: Negative for abdominal pain and blood in stool.   Endocrine: Negative for cold intolerance and heat intolerance.   Genitourinary: Negative for difficulty urinating.   Musculoskeletal: Negative for  arthralgias.   Neurological: Negative for dizziness, weakness, numbness and headaches.   Hematological: Negative for adenopathy.   Psychiatric/Behavioral: Negative for dysphoric mood. The patient is not nervous/anxious.    All other systems reviewed and are negative.           OBJECTIVE     BP 126/78 (BP Site: Left arm, Patient Position: Sitting)   Pulse 66   Temp 97.6 F (36.4 C) (Oral)   Resp 17   Ht 1.791 m (5' 10.5")   Wt 87.1 kg (192 lb)   BMI 27.16 kg/m     GENERAL : alert and in no distress   SKIN: warm and dry, w/o rash, normal turgor   Head: ncat  Neck: supple, no mass/thyromegaly, LAD, FROM  Eyes: PERRLA, EOMI  Ears: external canals clear, TMs clear, hearing intact to finger rub  Nose: patent, no turbinate swelling/discharge  Throat: , normal oropharynx, no exudates/swelling  LUNGS: clear, no w/r/r  HEART: RRR normal S1 and S2  without gallop , murmur or rub  ABDOMEN  BS(+)  soft, non- tender , non- distended,  without organomegaly  Feet - no skin lesions, mild onychomycosis, toe sensation intact, 2+ dorsal pedis pulses   PSYCH: Normal mood and affect, linear thought process, appropriate interactions, no SI/hallucinations       ASSESSMENT      MAXTEN SHULER is a 66 y.o. male with   1. Essential hypertension  CBC without differential    Comprehensive metabolic panel   2. Type 2 diabetes mellitus without complication, without long-term current use of insulin  Hemoglobin A1C    Microalbumin, Random Urine    metFORMIN (GLUCOPHAGE) 500 MG tablet    Lancets (FREESTYLE) lancets   3. Mixed hyperlipidemia  Lipid panel    ezetimibe (ZETIA) 10 MG tablet    rosuvastatin (CRESTOR) 5 MG tablet          PLAN       1. Essential hypertension  - At goal BP < 140/90, continue current regimen  - Encourage DASH diet and regular exercise, keep home BP log, call if elevated    - CBC without differential  - Comprehensive metabolic panel    2. Type 2 diabetes mellitus without complication, without long-term current use of  insulin  - Goal A1c < 7, good control base on home BS, encourage diabetic diet and regular exercise, continue current regimen   - Hemoglobin A1C  - Microalbumin, Random Urine  - metFORMIN (GLUCOPHAGE) 500 MG tablet; Take 1 tablet (500 mg total) by mouth every morning with breakfast.  Dispense: 90 tablet; Refill: 1  - Lancets (FREESTYLE) lancets; 1 each by Other route 2 (two) times daily.  Dispense: 100 each; Refill: 11    3. Mixed hyperlipidemia  - Goal LDL < 100, unknown control, statin benefit, cont statin/Zetia, no sfx, encourage diet and exercise   - Lipid panel  - ezetimibe (ZETIA) 10 MG tablet; Take 1 tablet (10 mg total) by mouth daily.  Dispense: 90 tablet; Refill: 1  - rosuvastatin (CRESTOR) 5 MG tablet; Take 1 tablet (5  mg total) by mouth daily.  Dispense: 90 tablet; Refill: 1     F/u 6 months    Medication list reviewed with patient and updated as indicated.  Risk & Benefits of any new medication(s) were explained to the patient who verbalized understanding & agreed to the treatment plan.  Patient given a printed copy of AVS. Patient verbalized understanding of instructions given.

## 2017-03-12 NOTE — Patient Instructions (Signed)
Diabetes General Instructions:  1. Meal Plan:  a. Control your calorie intake to avoid gaining weight.  b. Please eat 3 regular meals, don't skip. Eat very healthy foods, including vegetables, fruits, whole grains, nuts (in limitation), and fish. Use olive or canola oil as your oil source. No fruit juices or regular sodas.  c. Sodium restriction <1500 mg daily.  2. Exercise:  Activity: Consider an activity you can be comfortable with such as walking, jogging, running, swimming, or weights. Try to walk at least 5 days a week for 30 minutes each day. Start slowly and gradually build up your stamina. You can do this. Find activities that you enjoy! Be persistent and enthusiastic. Don't injure yourself and do not exercise to the point of exhaustion. Be very careful. Consult your MD.  3. Daily careful shoe and foot exam. If you cannot see your feet clearly and entirely please either use a mirror and/or ask a relative to do the exam for you (see below)  4. Blood Sugar Monitoring:   a. Please check your sugars 1-2 times each day. Record your glucose values in a log. Please bring your glucose readings or meter to each and every visit with each of your diabetes care-providers. If you are checking your sugars twice daily you can vary the times: before breakfast and dinner one day, and before lunch and 2 hours after dinner the next day. It is important to check your sugars 2 hours after meals to see how high the glucose readings are at this time. If you are on short acting and long acting insulins, you need to check sugars before each meal and bedtime at the very least.   b. Ideal blood sugar levels 80 to 130 before and 80 to 160 2hr after meals.  5. Yearly ophthalmology exam- be sure to have your ophthalmologist forward us a copy of his exam report. See your dentist every 6 months. Get flu shot yearly.  6. Labs: typically get comprehensive  metabolic panel and fasting lipid panel once per year (if controlled); A1c tests are done 2-4 times per year (depending on control)  7. Continued follow-up: every 3 months (or every 6 months if excellent control).  8. Insulin can be injected anywhere where you can pinch up fat (typically belly, inner thighs or outside of arms).  9. Most diabetes medications (other than metformin and Actos) can cause low blood sugars. If you experience symptoms such as shakiness, weakness, confusion, lightheadedness, or sweats, this may indicate your blood sugar is low. In this situation, please check your blood sugar. If it is low (<70), have a small snack such as 4 oz of orange juice, 1 cup of milk, 4-5 pieces of hard candy or 3-4 glucose tablets. Recheck sugar in 15 minutes. If not improved, can repeat as needed.  10. Blood pressure surveillance and cholesterol surveillance are also important parts of minimizing the risk for heart disease in the future. Typically the goal blood pressure is < 130/80 and goal LDL (bad cholesterol) is <100 (for patient already with heart disease the LDL should be <70).  11. Diabetes foot care DO's and DON'TS:  DO's:   Wash your feet daily with warm water & soap.   Never soak your feet without first checking the temperature of the water by hand   Dry your feet well, especially between the toes   If the skin on your feet is dry, apply lotion to them everyday after bathing. If your feet sweat a large   amount, apply powder to them everyday   Check your feet daily for blisters, cuts, sores, redness or swelling. Check carefully between your toes. Use a mirror to check the bottom of your feet. Notify your doctor right away if you find something wrong   Use an emery board gently to shape toenails even with the ends of the toes   File calluses or rough skin with an emery board to remove dead skin. This should be done after washing the feet to help soften the skin.   Cut your toenails  straight across (to prevent ingrown toenails)   Wear socks or stockings without seams or bumps that are clean and soft and well fitting   Keep your feet warm and dry   Wear shoes that fit well and do not rub toes or heels   Examine your shoes daily for cracks, pebbles, nails or anything that could hurt your feet   See your podiatrist (foot doctor) if you have a wound that is not healing    DON'Ts:   Never walk barefoot indoors or outdoors   Never use a hot water bottle or heating pad on your feet   Don't put lotion between your toes   Never use a knife or razor blade to cut your toenails or feet   Don't use chemicals or corn and callous removers yourself   Never rip off a hangnail   Never wear garters, tight socks or other clothing which cuts off circulation to your feet   Don't sit with your legs crossed at the knees as this slows blood flow to your feet   Don't smoke!      (The above instructions are courtesy of endocrinologist Dr. Champaneri)

## 2017-03-13 ENCOUNTER — Inpatient Hospital Stay: Payer: BLUE CROSS/BLUE SHIELD | Attending: Orthopaedic Surgery | Admitting: Physical Therapist

## 2017-03-13 DIAGNOSIS — M5440 Lumbago with sciatica, unspecified side: Secondary | ICD-10-CM | POA: Insufficient documentation

## 2017-03-13 NOTE — PT/OT Therapy Note (Signed)
Name: Kent Jordan Age: 66 y.o.   Referring Physician: Harriet Masson, MD   Date of Injury: 12/16/2016  Date Care Plan Established/Reviewed: 01/16/2017  Date Treatment Started: 01/16/2017  Visit Count: 9   Diagnosis: No diagnosis found.    Subjective     Pain   My back is not feeling bad today. I have been working with a lacrosse ball on my back. IT has helped a lot. As a result, doing more squats has hurt my knee. I am feeling it catching in the back and medial front of my knee. Even on stairs, I'll sometimes feel a pull in the back of my knee. I am starting to have problems with my L knee now.     Social Support/Occupation  Lives in: multiple level home  Lives with: spouse  Occupation: retired                Building surveyor   Justification: To improve gait and stairs mobility   CR stretching to R then L hamstrings    Manual Therapy   Justification: To improve AROM and TKE during gait  STMobs to L hip flexors, R hamstrings, L psoas, rectus abdominus with heel slide FMs         Assessment   Pt presented today with significantly less back pain than previous weeks and a CC of pain in B knees. Pain during knee extension with step up, limited hamstring length, and limited superior glide of B patellae prompted stretching, mobilization of hip flexors, and BC to patella to improve patellar tracking and terminal TKE during toe off. Pt responded favorably to CR to hamstrings for improved hip flexion ROM.   Plan   Superior patellar mobs, STMobs to ITB to improve tracking of patella.   NMR to improve activation of quadriceps      Goals    Goal 1:  Patient will improve score on Knee and lumbar FOTO by recommended score to demonstrate improved function (specific # to be updated once lumbar FOTO survey completed)    Sessions:  16   Progression:  progressing      Goal 2:  Patient will demonstrate improved lumbopelvic mobility to facilitatehip hinge squat with neutral trunk and T-rex arms to keep load close to  lift 10 lbs from knees to chest without c/o pain to increase safety with lifting for ADL's  03/13/17 Progressing with inominate mobility bilaterally to    Sessions:  16   Progression:  progressing       Goal 3:  Pt will report ability to amb with symmetrical gait pattern without AD x 30 mins at a time to ease ADL performance daily  03/13/17 Reports walking 2.5 to 3 miles.  MG   Sessions:  16   Progression:  met                                   [name], PT

## 2017-03-19 ENCOUNTER — Inpatient Hospital Stay: Payer: BLUE CROSS/BLUE SHIELD | Attending: Orthopaedic Surgery | Admitting: Physical Therapist

## 2017-03-19 DIAGNOSIS — M545 Low back pain: Secondary | ICD-10-CM | POA: Insufficient documentation

## 2017-03-19 NOTE — Progress Notes (Signed)
Name: Kent Jordan Age: 66 y.o.   Referring Physician: Harriet Masson, MD   Date of Injury: 12/16/2016  Date Care Plan Established/Reviewed: 01/16/2017  Date Treatment Started: 01/16/2017  Visit Count: 10   Diagnosis:   1. Acute midline low back pain, with sciatica presence unspecified        Subjective     History of Present Illness   Functional Limitations (PLOF): Pt able to walk three miles     Pain   I see Dr. Riki Altes on Thursday.  I am feeling better.  My knee is a little stiff today, can we work on it today.  I have been doing the hamstring stretches and they appear to be helping.     Social Support/Occupation  Lives in: multiple level home  Lives with: spouse  Occupation: retired    Architectural technologist of [employer]        Left AROM    Left PROM   Lumbar/Hip    Right AROM    Right PROM       Lumbar Rotation           Hip Flexion       0    Hip Extension 0          Hip Abduction           Hip Adduction           Hip IR           Hip ER       (blank fields were intentionally left blank)  Fwd bend:        Left AROM    Left PROM   Knee    Right AROM    Right PROM       Flexion 123          Extension 0      (blank fields were intentionally left blank)  LPM:  R flexion: 2/5 good initiation  L extension: 5/5 good initiation  L flexion: 4/5 good initiation  R extension: 2/5 good initiation         Strength        Left Strength  Hip      Right   3+  Hip Flexion 4      Hip Extension       Hip Abduction       Hip Adduction       Hip IR       Hip ER     5  Quadriceps 4+    4+  Hamstrings 4    (blank fields were intentionally left blank)       Left Strength  Ankle/Foot      Right   4  Ankle Dorsiflexion       Ankle Plantarflexion       Ankle Inversion       Ankle Eversion     (blank fields were intentionally left blank)             Treatment     Therapeutic Exercises   Justification: To improve gait mechaics   CR to improve L then R hip extension     Neuromuscular Re-Education   Justification: To improve gait mechanics  L  then R pelvic PD end range holds with L LE ext/ abd/ IR, progressed to COI with hip pivot      Manual Therapy   Supine:  STMobs to L quads/ IT band interface ,  entire quads with the stick          Assessment   Kent Jordan is a 66 y.o. male presenting with low back pain who requires Physical Therapy for the following:  Pt's pain has been declining over the last 2 weeks following a flare-up of pain from psoriatic arthritis. This allowed [place] to progress into more active work during today's session. Pt responded well to pelvic PNF patterns; by end of session pt demonstrated greater range in both AE and PD and described feeling better mobility in his R leg and knee, especially. This treatment was chosen in spite of pt's request to work on his R knee, as examination seemed to indicate that pain was driven from hip/LE weakness and limited extension ROM, rather than biomechanical issues at the knee. Pt will benefit from continuing skilled therapy to introduce more safe activity while pt's pain levels are low, as well as continuing to address hypomobilities of the hip and decreased muscle length in the LE, as well as core initiation during ambulation and other functional activities such as squatting and lifting.   Impairments:   Pain that limits and interferes with functional ability.   Impaired postural alignment.  Decreased range of motion of the lumbar spine and hip  Decreased strength of the core and hip  Decreased functional stability of the core  Decreased joint mobility of the lumbar spine  Decreased soft tissue mobility of lumbar spine  Decreased/impaired motor control during hip hinging        Pain located: Low back, LE's    Clinical presentation: stable   Barriers to therapy:   Time since onset of injury/illness/exacerbation  Past surgical history: Spinal surgeries, TKA  Comorbidities: psoriatic arthritis  Prior Level of Function: Pt able to walk three miles   Prognosis: good  Plan   Visits per week: 2  Number of Sessions:  16  Direct One on One  16109: Therapeutic Exercise: To Develop Strength and Endurance, ROM and Flexibility  [id number]: Gait Training  60454: Neuromuscular Reeducation  [phone]: Self Care/Home Mgmt Training (ADLs, safety procedures, use of assistive devices)  97140: Manual Therapy techniques (mobilization, manipulation, manual traction) (to cervical, thoracic, and lumbar spine, pelvis, hips, leg, knees, and ankles to address regional interdependence.)  97530: Therapeutic Activities: Dynamic activities to improve functional performance    Goals    Goal 1:  Patient will improve score on Knee and lumbar FOTO by recommended score to demonstrate improved function (specific # to be updated once lumbar FOTO survey completed)    Sessions:  16   Progression:  progressing      Goal 2:  Patient will demonstrate improved lumbopelvic mobility to facilitatehip hinge squat with neutral trunk and T-rex arms to keep load close to lift 10 lbs from knees to chest without c/o pain to increase safety with lifting for ADL's  03/13/17 Progressing with inominate mobility bilaterally to    Sessions:  16   Progression:  progressing       Goal 3:  Pt will report ability to amb with symmetrical gait pattern without AD x 30 mins at a time to ease ADL performance daily  03/13/17 Reports walking 2.5 to 3 miles.  MG   Sessions:  16   Progression:  met                                   [name], PT

## 2017-03-22 ENCOUNTER — Other Ambulatory Visit: Payer: Self-pay | Admitting: Physical Medicine & Rehabilitation

## 2017-03-22 ENCOUNTER — Ambulatory Visit
Admission: RE | Admit: 2017-03-22 | Discharge: 2017-03-22 | Disposition: A | Discharge status: Home / Self Care | Payer: BLUE CROSS/BLUE SHIELD | Source: Ambulatory Visit | Attending: Physical Medicine & Rehabilitation | Admitting: Physical Medicine & Rehabilitation

## 2017-03-22 DIAGNOSIS — M542 Cervicalgia: Secondary | ICD-10-CM

## 2017-03-22 DIAGNOSIS — M47892 Other spondylosis, cervical region: Secondary | ICD-10-CM | POA: Insufficient documentation

## 2017-03-22 DIAGNOSIS — M5010 Cervical disc disorder with radiculopathy, unspecified cervical region: Secondary | ICD-10-CM

## 2017-03-26 ENCOUNTER — Inpatient Hospital Stay: Payer: BLUE CROSS/BLUE SHIELD | Attending: Orthopaedic Surgery | Admitting: Physical Therapist

## 2017-03-26 DIAGNOSIS — M545 Low back pain: Secondary | ICD-10-CM | POA: Insufficient documentation

## 2017-03-26 NOTE — PT/OT Therapy Note (Signed)
Name: JEBIDIAH BAGGERLY Age: 66 y.o.   Referring Physician: Harriet Masson, MD   Date of Injury: 12/16/2016  Date Care Plan Established/Reviewed: 01/16/2017  Date Treatment Started: 01/16/2017  Visit Count: 11   Diagnosis:   1. Acute midline low back pain, with sciatica presence unspecified        Subjective     Pain   I don't feel that bad--my back feels pretty good today. My knee is stiff. On Friday, I got x-rays and a CT scan for my neck. 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 My dr. Lisabeth Devoid I have carpal tunnel syndrome. I got a renewal for PT through July or August. I'm having an UE nerve test on the 26th. 3 months ago, I had my first shingles shot. I went back on Friday for the second and could barely move. Every joint in my body hurt. The doctor told me this is a known side effect of the shot.     Social Support/Occupation  Lives in: multiple level home  Lives with: spouse  Occupation: retired    Psychologist, occupational Speed: decreased L knee TKE              Treatment     Manual Therapy   STMobs to R patellar tendon  R patellar mobs: superior/ inferior/ med/ lateral   STMobs to R hamstrings in supine with knee flexed, separation of hamstrings, with and without passive extension of the knee  [place]weigan position:  STMobs to R hamstrings with knee flex/ ext FM          Assessment   The irritability of the pt's back remained low during this session prompting treatment of the pt's knee. Examination revealed a lack of full extension of the R knee during gait. Manual therapy during today's session was geared towards restoring the pt's extension ROM on the R as well as improving general mobility of the soft tissue and joint of the knee. Following treatment, pt demonstrated improved flexibility and motion at the knee.   Plan   Continue with plan of care  Improve core engagement  Via PNF to decrease incidence of spasm.       Goals    Goal 1:  Patient will improve score on Knee and lumbar FOTO by  recommended score to demonstrate improved function (specific # to be updated once lumbar FOTO survey completed)    Sessions:  16   Progression:  progressing      Goal 2:  Patient will demonstrate improved lumbopelvic mobility to facilitatehip hinge squat with neutral trunk and T-rex arms to keep load close to lift 10 lbs from knees to chest without c/o pain to increase safety with lifting for ADL's  03/13/17 Progressing with inominate mobility bilaterally to    Sessions:  16   Progression:  progressing       Goal 3:  Pt will report ability to amb with symmetrical gait pattern without AD x 30 mins at a time to ease ADL performance daily  03/13/17 Reports walking 2.5 to 3 miles.  MG   Sessions:  16   Progression:  met                                   [name], PT

## 2017-04-03 ENCOUNTER — Inpatient Hospital Stay: Payer: BLUE CROSS/BLUE SHIELD | Attending: Orthopaedic Surgery | Admitting: Physical Therapist

## 2017-04-03 DIAGNOSIS — M5440 Lumbago with sciatica, unspecified side: Secondary | ICD-10-CM | POA: Insufficient documentation

## 2017-04-03 NOTE — PT/OT Therapy Note (Signed)
Name: Kent Jordan Age: 66 y.o.   Referring Physician: Harriet Masson, MD   Date of Injury: 12/16/2016  Date Care Plan Established/Reviewed: 01/16/2017  Date Treatment Started: 01/16/2017  Visit Count: 12   Diagnosis:   1. Acute right-sided low back pain with sciatica, sciatica laterality unspecified        Subjective     Pain   I've been good here recently. Now that the pool is open, I've been walking in the deep end of the pool. It's been good for my back and my knee is not stressed. I'm starting to have real problems with my neck. I go in for a nerve test next week. When I raise my arms to dry my hair, my biceps feel weak.     Social Support/Occupation  Lives in: multiple level home  Lives with: spouse  Occupation: retired    Objective     Neurological Testing     Reflexes     Right   Biceps (C5/C6): normal (2+)  Hoffman's reflex: negative             Treatment     Therapeutic Exercises   Justification: To improve gait mechanics   Instructed in self mobilization of bilateral lumbar paraspinals     Neuromuscular Re-Education   Attempted R pelvic AE but with LBP so swithc  R pelvic PE to eccentrically length R QL  R pelvic AD end range holds to re-ed in new range       Manual Therapy   L SL:  R pelvic RI R pelvic AE/PD   STMobs to R paraspinals  Prone:  Skin/ superficial fascia sacrum, lumbar spine  STMobs to paraspinals          Assessment   Pt continues on a positive trend, demonstrating lower overall irritability at the beginning of session. Pt was able to obtain increased length of QL during AD. However, AE/PD to the R pelvis to improve PD and leg extension caused pain in the contralateral low back. Examination revealed myofascial restrictions in the L low back, prompting STmobilization.   Plan   Complete AE/PD patterns to improve PD, hip and knee extension      Goals    Goal 1:  Patient will improve score on Knee and lumbar FOTO by recommended score to demonstrate improved function (specific # to be updated  once lumbar FOTO survey completed)    Sessions:  16   Progression:  progressing      Goal 2:  Patient will demonstrate improved lumbopelvic mobility to facilitatehip hinge squat with neutral trunk and T-rex arms to keep load close to lift 10 lbs from knees to chest without c/o pain to increase safety with lifting for ADL's  03/13/17 Progressing with inominate mobility bilaterally to    Sessions:  16   Progression:  progressing       Goal 3:  Pt will report ability to amb with symmetrical gait pattern without AD x 30 mins at a time to ease ADL performance daily  03/13/17 Reports walking 2.5 to 3 miles.  MG   Sessions:  16   Progression:  met                                   [name], PT

## 2017-04-10 ENCOUNTER — Inpatient Hospital Stay: Payer: BLUE CROSS/BLUE SHIELD | Attending: Orthopaedic Surgery | Admitting: Physical Therapist

## 2017-04-10 DIAGNOSIS — M5440 Lumbago with sciatica, unspecified side: Secondary | ICD-10-CM | POA: Insufficient documentation

## 2017-04-10 NOTE — PT/OT Therapy Note (Signed)
Name: Kent Jordan Age: 66 y.o.   Referring Physician: Harriet Masson, MD   Date of Injury: 12/16/2016  Date Care Plan Established/Reviewed: 01/16/2017  Date Treatment Started: 01/16/2017  Visit Count: 13   Diagnosis:   1. Acute right-sided low back pain with sciatica, sciatica laterality unspecified        Subjective     Pain   I went for a bike ride on Sunday, my knee was stiff but it loosened up. I lost  feeling in my R arm with pins and needles.  My test is next Thursday for the EMG.  This am I got under the kitchen sink and I had to cross my legs to get in there.  My R knee is sore now, and my back hurts when bring my knee to my chest.       Social Support/Occupation  Lives in: multiple level home  Lives with: spouse  Occupation: retired                Building surveyor   Active assist R hip flexion x 10 reps x 2 sets  SKTC stretch R 30 sec holds x 3 reps          Neuromuscular Re-Education   R pelvic AE/PD RI to improve lumbopelvic mobility around fusion   R pelvic AE end range holds with irradiation from R LE flex/ add/ ER/ ankle DF    Manual Therapy   STMobs to R iliacus with free ball FM   BC iliac crest R, R GT  STMobs to R QL, glut max/ med, cluneal nerves, TFL, IT band         Assessment   Patient able to flex R hip to 100 degrees without onset of sharp low back pain at end of session today. Focused on improving hip mobility to reach long term goal of efficient squats during ADL.  Next visit: Squat training.     Plan   FOTO      Goals    Goal 1:  Patient will improve score on Knee and lumbar FOTO by recommended score to demonstrate improved function (specific # to be updated once lumbar FOTO survey completed)    Sessions:  16   Progression:  progressing      Goal 2:  Patient will demonstrate improved lumbopelvic mobility to facilitatehip hinge squat with neutral trunk and T-rex arms to keep load close to lift 10 lbs from knees to chest without c/o pain to increase safety with lifting  for ADL's  03/13/17 Progressing with inominate mobility bilaterally to    Sessions:  16   Progression:  progressing       Goal 3:  Pt will report ability to amb with symmetrical gait pattern without AD x 30 mins at a time to ease ADL performance daily  03/13/17 Reports walking 2.5 to 3 miles.  MG   Sessions:  16   Progression:  met                                   [name], PT

## 2017-04-16 ENCOUNTER — Inpatient Hospital Stay: Payer: BLUE CROSS/BLUE SHIELD | Admitting: Physical Therapist

## 2017-04-17 ENCOUNTER — Encounter (INDEPENDENT_AMBULATORY_CARE_PROVIDER_SITE_OTHER): Payer: Self-pay | Admitting: Student in an Organized Health Care Education/Training Program

## 2017-04-23 ENCOUNTER — Inpatient Hospital Stay: Payer: BLUE CROSS/BLUE SHIELD | Attending: Orthopaedic Surgery | Admitting: Physical Therapist

## 2017-04-23 DIAGNOSIS — M5412 Radiculopathy, cervical region: Secondary | ICD-10-CM | POA: Insufficient documentation

## 2017-04-23 NOTE — PT/OT Therapy Note (Signed)
Name: Kent Jordan Age: 66 y.o.   Referring Physician: Harriet Masson, MD   Date of Injury: 03/24/2017  Date Care Plan Established/Reviewed: 04/23/2017  Date Treatment Started: 04/23/2017  Visit Count: 1   Diagnosis:   1. Cervical radiculitis        Subjective     History of Present Illness   Functional Limitations (PLOF): Unable to lift heavy objects from floor, difficulty with dressing/ grooming activities 2 to decreased R hand strength, difficulty checking blindspots while driving, Pain with sleeping on R shoulder, Difficulty with overhead activities.      Pain   I've had neck issues for a while but after falling into a hole while walking my dog and wrenching my neck, I started having a lot of pins and needles in both of my arms with weakness.  90% of pins and needles are gone in R UE since I have been taking prednisone.  I have 3 more tablets to go with the prednisone, then I am done.  I feel weaker in my R arm but the pain/ tingling has improved. I am afraid everything will come back since I will be finished with the steroids in a few days.  I'm having a hard time moving my neck and R shoulder.      Quality: Electric charge feeling  now in the R arm- R 4 fingers involved.    Location: R UE, neck      Tests/ imaging:  CT CERVICAL SPINE    CLINICAL INFORMATION: Cervicalgia    COMPARISON: No prior studies are available for comparison.    TECHNIQUE: Multiple thin section axial images were obtained from the  skull base to the thoracic inlet without the administration of  intravenous contrast. Coronal and sagittal reformatted images were then  obtained.    Note that CT scanning at this site utilizes multiple dose reduction  techniques including automatic exposure control, adjustment of the MAS  and/or KVP according to patient size, and use of iterative  reconstruction technique.    FINDINGS: No displaced fracture is detected. No increased atlantoaxial  distance is appreciated. There is no malalignment of the  lateral masses.  There is appropriate alignment of the cervical vertebral bodies and  adequate height is maintained. The paravertebral soft tissues are  unremarkable. There are again noted multilevel anterior and small  posterior osteophytes greatest at C4 through C6. There is moderate disc  space loss at C3-C4 and significant disc space loss at C4-C5 and C5-C6,  unchanged. No spinal multilevel facet and uncal hypertrophy again noted  with multilevel neural foramina narrowing, unchanged. Subchondral cyst  formation at C1 and C2 unchanged.    IMPRESSION:      No acute change. Moderate significant degenerative changes, unchanged.      Degenerative changes involving the cervical spine. Neural foraminal  stenoses at multiple levels. There are disc bulges at the C3-4, C4-5,  and C5-6 levels with likely cord impingement.    [place]  Teferra, MD   03/24/2017 11:25 PM          XRAY:  Indication: Cervical pain. Radiculopathy.    FINDINGS: 7 view study of the cervical spine including flexion and  extension views. Comparison to studies of 08/30/2014. There has been  progressive increase in the ligament calcification and bone spurs at the  C2-3, C3-4, C4-5 and C5-6 level. Intervertebral disc space narrowing is  stable at C4-5 and C5-6. Small posterior bone spurs present at C4-5 and  C5-6. Vertebral  bodies are aligned. Posterior bone spurs appear to  narrow the neural foramen at the C5-6 level bilaterally and at the C6-7  level on the left side. C1 is aligned with C2. The odontoid process is  unremarkable.  Flexion views demonstrate no subluxation. Extension views demonstrate  limited extension. No subluxation.  Carotid artery calcifications seen, left side greater than right.    IMPRESSION:    Degenerative changes in the cervical spine from C2-3 C6.  Increased bone spur and ligament calcifications when compared to 2013.  Posterior bone spurs appear to narrow the foramen at C5-6 and C6-7 and  remain unchanged. No acute  fracture or subluxation.    Kinnie Feil, MD   03/22/2017 4:58 PM    Social Support/Occupation  Lives in: multiple level home  Lives with: spouse  Occupation: retired    Objective     Observation  [place] postural classification: anterior/ anterior    Range of Motion     AROM: Cervical/Thoracic Spine       Cervical Flexion  WFL   Cervical Extension  WFL, pain   Thoracic Flexion     Thoracic Extension     (blank fields were intentionally left blank)         Left AROM Cervical/Thoracic    Right AROM   15 pain Cervical Lateral Flexion 10 pain   40  Cervical Rotation 30      Thoracic Lateral Flexion       Thoracic Rotation     (blank fields were intentionally left blank)       Left AROM    Left PROM   Shoulder    Right AROM    Right PROM   115    Flexion 145           Extension       110    Abduction 120          Internal Rotation           External Rotation       (blank fields were intentionally left blank)  Lat pulling     Strength     unable to assume axial elongation to test short neck flexor strength   Left Strength  Shoulder   unable to assume axial elongation to test short neck flexor strength   Right     Neck Lateral Flexion     3+ pain  Shoulder Flexion 3+ pain     Shoulder Extension     3+ pain  Shoulder Abduction 3+ pain     Shoulder IR       Shoulder ER       Serratus       Rhomboids       Upper Trapezius       Middle Trapezius       Lower Trapezius       Biceps       Triceps     (blank fields were intentionally left blank)        Cervical compression: no radicular sx  Cervical distraction: relieves neck pain    Joint mobility:  Hard endfeel with PA springs T1-T12 with pain reported at all levels  Hard endfeel inferior spring of R 1st rib  Hard endfeel AP spring R C3-C6    Palpation:   ST restriction thoracic paraspinals bilaterally, Upper and lower traps, rhomboids, middle deltoid    Scap NM control: Lacking upward rotation of R scap  Treatment     Therapeutic Exercises   Instructed in  isometric deep neck flexor strengthening to facilitate cervical stabilization      Manual Therapy   Prone:  BC bilateral thoracic and lumbar spinal grooves, spinous processes   STMobs to bilateral thoracic paraspinals T1-T12, R lats         ---      ---   Total Time   Timed Minutes  35 minutes   Untimed Minutes  15 minutes   Total Time  50 minutes        Assessment   Erle is a 66 y.o. male presenting with cervical radiculopathy who requires Physical Therapy for the following:  Impairments:   Pain that limits and interferes with functional ability.   Impaired postural alignment.  Decreased range of motion of the cervical spine  Decreased strength of the scapular musculature and deep neck flexor  Decreased deep neck flexor strength and stability.   Decreased joint mobility of the cervical spine  Decreased soft tissue mobility of cervical spine    Pain located: neck, R UE, thoracic spine    Clinical presentation: evolving depending on position, activity level, cervical/ shoulder AROM  Barriers to therapy: Comorbidities LBP, lumbo-sacral fusion, sacral stimulator, OA, history of fracture L humerus  Prior Level of Function: Unable to lift heavy objects from floor, difficulty with dressing/ grooming activities 2 to decreased R hand strength, difficulty checking blindspots while driving, Pain with sleeping on R shoulder, Difficulty with overhead activities.    Prognosis: excellent  Plan   Visits per week: 2  Number of Sessions: 22  [employer] on One  47425: Therapeutic Exercise: To Develop Strength and Endurance, ROM and Flexibility  [id number]: Neuromuscular Reeducation  [id number]: Self Care/Home Mgmt Training (ADLs, safety procedures, use of assistive devices)  97140: Manual Therapy techniques (mobilization, manipulation, manual traction) (cervical/ thoracic/ lumbar spine, pelvis, scapula bilat, bilateral UE's, cranium, ribcage to address regionally interdependent dysfunction)  97530: Therapeutic Activities: Dynamic activities to  improve functional performance    Goals    Goal 1:  Patient will improve score on FOTO to demonstrate improved function.    Sessions:  22      Goal 2:  Improve bilateral cervical rotation to >=60 degrees to facilitate safety while driving   Sessions:  22      Goal 3:  Pt will demo efficient axial elongation, hip hinge with neutral trunk and T-rex arms to keep load close to lift 10 lbs from knees to chest without c/o pain to increase safety with lifting for ADL's   Sessions:  22      Goal 4:  Patient will score >=4/5 on resisted pivot prone R UE to facilitate painfree use of R UE during dressing and bathing activities.                                [name], PT

## 2017-04-24 ENCOUNTER — Inpatient Hospital Stay: Payer: BLUE CROSS/BLUE SHIELD | Attending: Orthopaedic Surgery | Admitting: Physical Therapist

## 2017-04-24 DIAGNOSIS — M5412 Radiculopathy, cervical region: Secondary | ICD-10-CM | POA: Insufficient documentation

## 2017-04-24 NOTE — PT/OT Therapy Note (Signed)
Name: Kent Jordan Age: 66 y.o.   Referring Physician: Harriet Masson, MD   Date of Injury: 03/24/2017  Date Care Plan Established/Reviewed: 04/23/2017  Date Treatment Started: 04/23/2017  Visit Count: 2   Diagnosis:   1. Cervical radiculitis        Subjective     Pain   I felt fine this am.  I got up and turned my head to the L and felt a snap L lateral side of the neck and went all the way back down to my L leg. .      Social Support/Occupation  Lives in: multiple level home  Lives with: spouse  Occupation: retired    Architectural technologist of Motion          Left AROM Cervical/Thoracic    Right AROM     Cervical Lateral Flexion     20 pain Cervical Rotation 20 pain     Thoracic Lateral Flexion       Thoracic Rotation     (blank fields were intentionally left blank)  Post tx: painfree rotation 30 degrees bilaterally              Treatment     Neuromuscular Re-Education   Justification: To re-ed in new range  Active assist isometric axial elongation in supine 10 sec hold x 5 reps in supine then in sitting position.      Manual Therapy   Justification: To improve cervical function   Skin/superficial fascia manubrium/ sternum AP manubrial mobs grade 4 with lazy cobra to R then L  Manubrial extension mobs grade 4  Sternal mobs with resisted breathing to improve exhalation, then inhalation   Skin/ superficial fascia R posterior ribcage  STMobs to R lats, lower trap, rhomboids, iliocostalis  STMobs to L then R deep cervical fascia, scalenes, UT, SCM, UT  AP grade 4 to L 1st rib to improve on axis position with L cervical rotation FM         Assessment   Increased cervical rotation bilaterally with decreased pain after initiation of UQ progression.  Continue with bilateral 1st and 2nd rib mobility/ position to improve cervical mobility.  Also assess and treat scap/ lat dysfunction bilaterally .  Patient with improved ability to perform axial elongation without excessive posterior shearing of cervical spine.    Plan    Continue per POC      Goals    Goal 1:  Patient will improve score on FOTO to demonstrate improved function.    Sessions:  22      Goal 2:  Improve bilateral cervical rotation to >=60 degrees to facilitate safety while driving   Sessions:  22      Goal 3:  Pt will demo efficient axial elongation, hip hinge with neutral trunk and T-rex arms to keep load close to lift 10 lbs from knees to chest without c/o pain to increase safety with lifting for ADL's   Sessions:  22      Goal 4:  Patient will score >=4/5 on resisted pivot prone R UE to facilitate painfree use of R UE during dressing and bathing activities.                                [name], PT

## 2017-04-30 ENCOUNTER — Inpatient Hospital Stay: Payer: BLUE CROSS/BLUE SHIELD | Attending: Orthopaedic Surgery | Admitting: Physical Therapist

## 2017-04-30 DIAGNOSIS — M5412 Radiculopathy, cervical region: Secondary | ICD-10-CM | POA: Insufficient documentation

## 2017-04-30 DIAGNOSIS — M5440 Lumbago with sciatica, unspecified side: Secondary | ICD-10-CM

## 2017-04-30 NOTE — PT/OT Therapy Note (Signed)
Name: Kent Jordan Age: 66 y.o.   Referring Physician: Harriet Masson, MD   Date of Injury: 03/24/2017  Date Care Plan Established/Reviewed: 04/23/2017  Date Treatment Started: 04/23/2017  Visit Count: 3   Diagnosis:   1. Cervical radiculitis        Subjective     Pain   My birthday was on [birth year], I did a lot of singing, dancing, and jumping up and down.  My back is hurting so badly today.  I did all my exercises and that took care of some of the pain, but I am still feeling a lot of tightness in the glut and back on L.      Social Support/Occupation  Lives in: multiple level home  Lives with: spouse  Occupation: retired    Objective     Observation  Unable to assume position for unilateral ab series in supine for self facilitation of core.  L adductor st restriction limiting L LE abduction extension during gait.               Treatment     Neuromuscular Re-Education   endrange holds L LE abduction  Unilateral ab series 45 sec holds x 2 reps each leg     Manual Therapy   BC L iliac crest   STMobs to L QL ,  glut med , adductors, quads, TFL, glut med, glut/ hamstring interface, R thoracic paraspinals T12 to T4  Inferior glide grade 4 to femur in SL with CR to adduction  Inominate abduction mob grade 4 with CR to adduction         Assessment   Patient able to get into unilateral ab series position to self facilitate NMC at end of session today. Improved L hip mobility with improved weight acceptance during gait, patient reports decreased pain with WB positions.    Plan   Continue per POC      Goals    Goal 1:  Patient will improve score on FOTO to demonstrate improved function.    Sessions:  22      Goal 2:  Improve bilateral cervical rotation to >=60 degrees to facilitate safety while driving   Sessions:  22      Goal 3:  Pt will demo efficient axial elongation, hip hinge with neutral trunk and T-rex arms to keep load close to lift 10 lbs from knees to chest without c/o pain to increase safety with lifting for ADL's    Sessions:  22      Goal 4:  Patient will score >=4/5 on resisted pivot prone R UE to facilitate painfree use of R UE during dressing and bathing activities.                                [name], PT

## 2017-05-01 ENCOUNTER — Inpatient Hospital Stay: Payer: BLUE CROSS/BLUE SHIELD | Attending: Orthopaedic Surgery | Admitting: Physical Therapist

## 2017-05-01 DIAGNOSIS — M5412 Radiculopathy, cervical region: Secondary | ICD-10-CM | POA: Insufficient documentation

## 2017-05-01 NOTE — PT/OT Therapy Note (Signed)
Name: Kent Jordan Age: 66 y.o.   Referring Physician: Harriet Masson, MD   Date of Injury: 03/24/2017  Date Care Plan Established/Reviewed: 04/23/2017  Date Treatment Started: 04/23/2017  Visit Count: 4   Diagnosis:   1. Cervical radiculitis        Subjective     Pain   My back feels much better since yesterday.  I've been off the steroids for about a week now . T1T2 pain, L UT nagging pain     Social Support/Occupation  Lives in: multiple level home  Lives with: spouse  Occupation: retired                Printmaker Exercises   Patient unable to bring hands to scapula so modified to hands on sternum with verbal cues Gentle sternal lifting with looking up to facilitate extension (avoiding cervical extension) x 10 reps.  Added to HEP  Reviewed axial elongation in sitting: verbal and tactile cues to avoid cervical extension, lengthen posterior cervical spine vs. extension    Neuromuscular Re-Education   End range holds R HH retraction to re-ed in new range  End range holds axial elongation  JLS position:   End range holds to re-ed in new thoracic extension range    Manual Therapy   Sitting:  BC spinous processes T1-T4, bilateral upper thoracic spinal grooves  Cupping T1/T2 spinous processes to improve mobility   T1-T4 extension mobs grade 4 in JLS position   Supine:  STMobs to L UT with R cervical rotation , post serratus  Post glide grade 4 R GH joint with hold relax          Assessment   Patient with forward head posture and hypomobility with upper thoracic extension as major contributor thus prompting thoracic mobs to facilitate efficient cervicthoracic posture.  Patient able to assume more efficient axial elongation in WB at end of session today.    Plan   Continue per POC  UQ progression      Goals    Goal 1:  Patient will improve score on FOTO to demonstrate improved function.    Sessions:  22      Goal 2:  Improve bilateral cervical rotation to >=60 degrees to facilitate safety while driving    Sessions:  22      Goal 3:  Pt will demo efficient axial elongation, hip hinge with neutral trunk and T-rex arms to keep load close to lift 10 lbs from knees to chest without c/o pain to increase safety with lifting for ADL's   Sessions:  22      Goal 4:  Patient will score >=4/5 on resisted pivot prone R UE to facilitate painfree use of R UE during dressing and bathing activities.                                [name], PT

## 2017-05-06 ENCOUNTER — Inpatient Hospital Stay: Payer: BLUE CROSS/BLUE SHIELD | Attending: Orthopaedic Surgery | Admitting: Physical Therapist

## 2017-05-06 DIAGNOSIS — M5440 Lumbago with sciatica, unspecified side: Secondary | ICD-10-CM

## 2017-05-06 DIAGNOSIS — M5412 Radiculopathy, cervical region: Secondary | ICD-10-CM | POA: Insufficient documentation

## 2017-05-06 NOTE — PT/OT Therapy Note (Signed)
Name: Kent Jordan Age: 66 y.o.   Referring Physician: Harriet Masson, MD   Date of Injury: 03/24/2017  Date Care Plan Established/Reviewed: 04/23/2017  Date Treatment Started: 04/23/2017  Visit Count: 5   Diagnosis:   1. Cervical radiculitis    2. Acute bilateral low back pain with sciatica, sciatica laterality unspecified        Subjective     Pain   I am hurting today.  This weekend I spent about 5 hours eating crabs, my entire back hurts and feels stiff.      Social Support/Occupation  Lives in: multiple level home  Lives with: spouse  Occupation: retired    Objective     Observation  Patient flexed fwd in chair with elbows on thighs , demonstrates difficulty with sit to stand transfer- unable to stand fully erect and ambulated into clinic flexed fwd at hips 30 degrees             Treatment     Therapeutic Exercises   Sitting ab series:  Centrally and unilaterally 20 sec holds each position with verbal cues to lift heels     Manual Therapy   Prone:   Cleared thoracic and lumbar spinal grooves  STMobs to lumbar and thoracic paraspinals, lats, rhomboids  L rib mobs with deep breathing: physiologic and accessory mobility with pelvic AE/PD    SL:  BC R iliac crest  STMobs to R lumbar paraspinals  Nerve flossing with L LE ext/ abd for facilitate SB to improve nerve mobility        ---      ---   Total Time   Timed Minutes  43 minutes   Total Time  43 minutes        Assessment   Decreased ST restriction L QL with improved ability to extend lumbar spine and stand up straight at end of visit lacking aprox 5 degrees of hip extension.    Continue to improve thoracic and lumbar spinal mobility, ribcage mobility to improve transfers, walking, and prolonged standing  Plan   Continue per POC  FOTO      Goals    Goal 1:  Patient will improve score on FOTO to demonstrate improved function.    Sessions:  22      Goal 2:  Improve bilateral cervical rotation to >=60 degrees to facilitate safety while driving  12/29/94 Progressing  with thoracic girdle mobility to improve AROM. MG   Sessions:  22   Progression:  progressing       Goal 3:  Pt will demo efficient axial elongation, hip hinge with neutral trunk and T-rex arms to keep load close to lift 10 lbs from knees to chest without c/o pain to increase safety with lifting for ADL's  05/01/17 Requires verbal and tactile cues to avoid compensations and pain MG   Sessions:  22      Goal 4:  Patient will score >=4/5 on resisted pivot prone R UE to facilitate painfree use of R UE during dressing and bathing activities.                                [name], PT

## 2017-05-08 ENCOUNTER — Inpatient Hospital Stay: Payer: BLUE CROSS/BLUE SHIELD | Admitting: Physical Therapist

## 2017-05-15 ENCOUNTER — Inpatient Hospital Stay: Payer: BLUE CROSS/BLUE SHIELD | Attending: Orthopaedic Surgery | Admitting: Physical Therapist

## 2017-05-15 DIAGNOSIS — M5412 Radiculopathy, cervical region: Secondary | ICD-10-CM

## 2017-05-15 DIAGNOSIS — M5440 Lumbago with sciatica, unspecified side: Secondary | ICD-10-CM

## 2017-05-15 NOTE — PT/OT Therapy Note (Signed)
Name: AZARIA BARTELL Age: 66 y.o.   Referring Physician: Harriet Masson, MD   Date of Injury: 03/24/2017  Date Care Plan Established/Reviewed: 04/23/2017  Date Treatment Started: 04/23/2017  Visit Count: 6   Diagnosis:   1. Acute bilateral low back pain with sciatica, sciatica laterality unspecified    2. Cervical radiculitis        Subjective     Pain   I got my shot on Monday, I feel like it's starting to kick in at C7 to T1.  The R side of my back is bothering me now, L LB feels better.  I think the shot is helping, I don't have the mid back tightness that I usually feel when I am sitting.  I am going to start wallking the dog now that I feel better.      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    L then R iliac crest   L ribcage mobs with pelvic AE/PD grade 4-9  STMobs to glutes and hamstring interface bilaterally to improve hip flexion   LE Nerve mobilization in supine with LE ext/ abd to mobilize nerve roots bilaterally        ---      ---   Total Time   Timed Minutes  52 minutes   Total Time  52 minutes        Assessment   Patient with decreased soft tissue restriction in bilateral lumbopelvic girdle with improve gait mechanics ( increased trunk rotation and weight acceptance bilaterally).  Focused on improving bilateral hip mobility to facilitate squats/ lifts during ADL.    Plan   Continue per POC  Reciprocal scap/pelvic patterns to improve balance during gait  Weight acceptance bilateral LE's to improve squat mechanics.    FOTO      Goals    Goal 1:  Patient will improve score on FOTO to demonstrate  improved function.    Sessions:  22      Goal 2:  Improve bilateral cervical rotation to >=60 degrees to facilitate safety while driving  09/29/08 Progressing with thoracic girdle mobility to improve AROM. MG   Sessions:  22   Progression:  progressing       Goal 3:  Pt will demo efficient axial elongation, hip hinge with neutral trunk and T-rex arms to keep load close to lift 10 lbs from knees to chest without c/o pain to increase safety with lifting for ADL's  05/01/17 Requires verbal and tactile cues to avoid compensations and pain MG   Sessions:  22      Goal 4:  Patient will score >=4/5 on resisted pivot prone R UE to facilitate painfree use of R UE during dressing and bathing activities.  Delora Fuel, PT

## 2017-05-16 ENCOUNTER — Inpatient Hospital Stay: Payer: BLUE CROSS/BLUE SHIELD

## 2017-05-18 ENCOUNTER — Emergency Department: Payer: BLUE CROSS/BLUE SHIELD

## 2017-05-18 ENCOUNTER — Emergency Department
Admission: EM | Admit: 2017-05-18 | Discharge: 2017-05-19 | Disposition: A | Discharge status: Home / Self Care | Payer: BLUE CROSS/BLUE SHIELD | Attending: Emergency Medicine | Admitting: Emergency Medicine

## 2017-05-18 DIAGNOSIS — Z7984 Long term (current) use of oral hypoglycemic drugs: Secondary | ICD-10-CM | POA: Insufficient documentation

## 2017-05-18 DIAGNOSIS — S2231XA Fracture of one rib, right side, initial encounter for closed fracture: Secondary | ICD-10-CM | POA: Insufficient documentation

## 2017-05-18 DIAGNOSIS — W109XXA Fall (on) (from) unspecified stairs and steps, initial encounter: Secondary | ICD-10-CM | POA: Insufficient documentation

## 2017-05-18 DIAGNOSIS — M48061 Spinal stenosis, lumbar region without neurogenic claudication: Secondary | ICD-10-CM | POA: Insufficient documentation

## 2017-05-18 DIAGNOSIS — I499 Cardiac arrhythmia, unspecified: Secondary | ICD-10-CM | POA: Insufficient documentation

## 2017-05-18 DIAGNOSIS — I10 Essential (primary) hypertension: Secondary | ICD-10-CM | POA: Insufficient documentation

## 2017-05-18 DIAGNOSIS — L4050 Arthropathic psoriasis, unspecified: Secondary | ICD-10-CM | POA: Insufficient documentation

## 2017-05-18 DIAGNOSIS — G629 Polyneuropathy, unspecified: Secondary | ICD-10-CM | POA: Insufficient documentation

## 2017-05-18 DIAGNOSIS — E7800 Pure hypercholesterolemia, unspecified: Secondary | ICD-10-CM | POA: Insufficient documentation

## 2017-05-18 DIAGNOSIS — Z87891 Personal history of nicotine dependence: Secondary | ICD-10-CM | POA: Insufficient documentation

## 2017-05-18 DIAGNOSIS — M546 Pain in thoracic spine: Secondary | ICD-10-CM | POA: Insufficient documentation

## 2017-05-18 DIAGNOSIS — Z96651 Presence of right artificial knee joint: Secondary | ICD-10-CM | POA: Insufficient documentation

## 2017-05-18 DIAGNOSIS — E119 Type 2 diabetes mellitus without complications: Secondary | ICD-10-CM | POA: Insufficient documentation

## 2017-05-18 DIAGNOSIS — W108XXA Fall (on) (from) other stairs and steps, initial encounter: Secondary | ICD-10-CM

## 2017-05-18 DIAGNOSIS — M1990 Unspecified osteoarthritis, unspecified site: Secondary | ICD-10-CM | POA: Insufficient documentation

## 2017-05-18 DIAGNOSIS — S161XXA Strain of muscle, fascia and tendon at neck level, initial encounter: Secondary | ICD-10-CM

## 2017-05-18 DIAGNOSIS — Z981 Arthrodesis status: Secondary | ICD-10-CM | POA: Insufficient documentation

## 2017-05-18 DIAGNOSIS — I2510 Atherosclerotic heart disease of native coronary artery without angina pectoris: Secondary | ICD-10-CM | POA: Insufficient documentation

## 2017-05-18 DIAGNOSIS — Z79899 Other long term (current) drug therapy: Secondary | ICD-10-CM | POA: Insufficient documentation

## 2017-05-18 DIAGNOSIS — Z7982 Long term (current) use of aspirin: Secondary | ICD-10-CM | POA: Insufficient documentation

## 2017-05-18 MED ORDER — ONDANSETRON HCL 4 MG/2ML IJ SOLN
4.0000 mg | Freq: Once | INTRAMUSCULAR | Status: AC
Start: 2017-05-18 — End: 2017-05-19
  Administered 2017-05-19: 4 mg via INTRAVENOUS
  Filled 2017-05-18: qty 2

## 2017-05-18 MED ORDER — SODIUM CHLORIDE 0.9 % IV BOLUS
500.0000 mL | Freq: Once | INTRAVENOUS | Status: AC
Start: 2017-05-18 — End: 2017-05-19
  Administered 2017-05-19: 500 mL via INTRAVENOUS

## 2017-05-18 MED ORDER — FENTANYL CITRATE (PF) 50 MCG/ML IJ SOLN (WRAP)
50.0000 ug | Freq: Once | INTRAMUSCULAR | Status: AC
Start: 2017-05-19 — End: 2017-05-19
  Administered 2017-05-19: 50 ug via INTRAVENOUS
  Filled 2017-05-18: qty 2

## 2017-05-18 NOTE — ED Provider Notes (Signed)
[facility] EMERGENCY DEPARTMENT H&P                                             ATTENDING SUPERVISORY NOTE       ATTENDING NOTE        Kent Jordan is a 66 year old male BIBA who fell down 6 stairs 30 minutes ago. He complains of neck and back pain. Patient has a history of chronic C-spine pain.  Recent steroid injections to C6 as well as chronic lumbar pain status post laminectomy.      Denies LOC associated with fall  Denies dizziness, SOB, CP, and paraesthesias    PE: Lungs: CTAB, CV: RRR, no M/G/R, Abdomen: soft and non-tender, active BS. Musk-patient with diffuse C-spine tenderness, more pronounced at C6-C7, where his chronic pain is    I spoke to and examined the patient as well: Yes  I was present during key portions of any procedures performed: N/A            VISIT INFORMATION        Clinical Course in the ED:    Patient found to have isolated rib fracture, right posterior.  Patient was able to a bilirubin.  He does have acute worsening of his chronic neck pain, but no bony abnormalities on CT scan.  C-collar was cleared clinically.  Patient told to follow-up with his doctor, as he may need physical therapy on his neck.  Patient was given restricted.  Return precautions should he develop any numbness, tingling or neuro deficits.  He was also told that he may need an MRI should his pain continue.  All results/findings were reviewed then discussed with both patient and any family  present and all questions were answered. I rediscussed in depth precautions for which  to return for immediate reeval, and pt expressed understanding.    Myself & staff explained that not all medical issues can be fully evaluated, diagnosed,  and managed in the emergency dept, and reiterated the importance of outpt followup.  Safe and stable for discharge home with fu as discussed           Medications Given in the ED:    .     ED Medication Orders     Start Ordered     Status Ordering Provider    05/19/17  0130 05/19/17 0129  cyclobenzaprine (FLEXERIL) tablet 10 mg  Once     Route: Oral  Ordered Dose: 10 mg     Last MAR action:  Given Kent Jordan  E    05/19/17 0000 05/18/17 2359  fentaNYL (PF) (SUBLIMAZE) injection 50 mcg  Once     Route: Intravenous  Ordered Dose: 50 mcg     Last MAR action:  Given Kent Jordan    05/18/17 2350 05/18/17 2349  sodium chloride 0.9 % bolus 500 mL  Once     Route: Intravenous  Ordered Dose: 500 mL     Last MAR action:  Stopped Kent Jordan    05/18/17 2348 05/18/17 2347  ondansetron (ZOFRAN) injection 4 mg  Once     Route: Intravenous  Ordered Dose: 4 mg     Last MAR action:  Given Kent Jordan            Procedures:            Interpretations:  O2 sat-           saturation: 97 %; Oxygen use: room air; Interpretation: Normal                 PAST HISTORY        Primary Care Provider: Erasmo Score, MD        PMH/PSH:    .     Past Medical History:   Diagnosis Date   . Arrhythmia     when on ARB   . Arthritis     psoriatic and osteoarthritis    . Back pain    . Blood disorder 11/2014    Hemochromatosis   . Diabetes mellitus 2016    started metformin 05/2016 d/t A1C 7.1 - now checking BS 2x/day - average 70-110 in the last week - A1C on 06/29/16 = 6.3 , has also lost 16 lbs since 05/2016   . Difficulty in walking(719.7)     Uses cane to ambulate   . Elevated liver function tests     had abd u/s 07/05/16    . Failed back syndrome    . Fatty liver    . Hemochromatosis     phlebotomy every 3 months - last done 02/2016   . High cholesterol    . Hypertensive disorder 06/2016    norvasc recently increased to 10 mg daily - now controlled on meds - most recent BP 117/73   . Low back pain    . Lung nodule 2012    benign - was monitored x  3 yrs by Kent Jordan) - signed off from pulmonology - no further f/u required since 2015   . Neuropathy     Tingling left foot, numbness left toes   . Psoriasis    . Psoriatic arthritis     tx w/ enbrel   . S/P insertion of spinal cord stimulator 2015    . Sleep apnea     mild - does not have CPAP - was unable to tolerate, less snoring since turboplasty       He has a past surgical history that includes Lumbar fusion (10/2011); Microdiscectomy lumbar (06/2011, 07/2011); Knee arthrocentesis (1983); Abdominal surgery (03/1971); CATARACT EXT.WITH IOL (Bilateral, 2009); Fracture surgery (1993); INSERTION, SPINAL CORD STIMULATOR GENERATOR (N/A, 05/07/2014); Septoplasty (10/05/2015); Skin biopsy (11/2015); ARTHROSCOPY, KNEE (Right, 03/21/2016); and ARTHROPLASTY, KNEE, TOTAL (Right, 08/20/2016).      Social/Family History:      He reports that he quit smoking about 5 years ago. He has a 41.00 pack-year smoking history. He has never used smokeless tobacco. He reports that he drinks about 1.2 oz of alcohol per week . He reports that he does not use drugs.    Family History   Problem Relation Age of Onset   . Heart disease Mother    . Heart disease Father          Listed Medications on Arrival:    .     Home Medications     Med List Status:  In Progress Set By: Kent Frame, RN at 05/19/2017 12:44 AM                amLODIPine (NORVASC) 10 MG tablet          aspirin 81 MG tablet     Take 81 mg by mouth daily.     celecoxib (CELEBREX) 200 MG capsule     1 (ONE) CAPSULE BY MOUTH TWO TIMES DAILY  clobetasol (TEMOVATE) 0.05 % cream     1 APP 2 TIMES A DAY TO AFFECTED AREAS APPLIED TOPICALLY 30 DAYS     cyclobenzaprine (FLEXERIL) 10 MG tablet     Take 10 mg by mouth daily.        diazePAM (VALIUM) 5 MG tablet     TAKE 1 TABLET BY MOUTH EVERY DAY     DULoxetine (CYMBALTA) 60 MG capsule     Take 60 mg by mouth daily.     ENBREL SURECLICK 50 MG/ML Solution Auto-injector     Inject as directed once a week.Thursdays         ezetimibe (ZETIA) 10 MG tablet     Take 1 tablet (10 mg total) by mouth daily.     folic acid (FOLVITE) 400 MCG tablet     Take 400 mcg by mouth daily.     FREESTYLE LITE test strip     1 each by Other route 2 (two) times daily.     hydroCHLOROthiazide (HYDRODIURIL) 25  MG tablet     Take 25 mg by mouth daily.        Lancets (FREESTYLE) lancets     1 each by Other route 2 (two) times daily.     metFORMIN (GLUCOPHAGE) 500 MG tablet     Take 1 tablet (500 mg total) by mouth every morning with breakfast.     rosuvastatin (CRESTOR) 5 MG tablet     Take 1 tablet (5 mg total) by mouth daily.     traZODone (DESYREL) 50 MG tablet     Take 50 mg by mouth nightly as needed.             Allergies: He is allergic to angiotensin receptor blockers; ciprofloxacin; ace inhibitors; and gabapentin.            RESULTS        Lab Results:      Results     Procedure Component Value Units Date/Time    Glucose Whole Blood - POCT [[id number]]  (Abnormal) Collected:  05/19/17 0012     Updated:  05/19/17 0030     POCT - Glucose Whole blood 106 (H) mg/dL               Radiology Results:      CT Lumbar Spine without Contrast   Final Result    CT scan of the lumbar spine shows no definite acute   traumatic abnormality. No definite acute fractures are seen.         The   lumbar spine shows no definite acute injury. Lumbar fusion changes are   noted.     Apparent loosening around the ankle screws of L3 appears   unchanged.     Moderate multilevel canal stenosis is noted. A   neurostimulator device is noted.                                              Miguel Dibble, MD    05/19/2017 1:38 AM      CT Thoracic Spine without Contrast   Final Result    CT scan of the thoracic spine shows an acute nondisplaced   fracture of the right third posterior rib without other definite   injuries. No other definite fractures are seen.     Minimal spinal   degenerative  changes are noted. The thoracic spine shows no apparent   injury. A neurostimulator device is noted.     Coronary artery   calcifications are noted.                                      Miguel Dibble, MD    05/19/2017 1:29 AM      CT Cervical Spine without Contrast   Final Result    CT scan of the cervical spine shows no definite acute   abnormality. No fracture or  dislocation is seen.     Spinal degenerative   changes are noted with canal stenosis as described. Correlate clinically   to determine whether further imaging is indicated. The cervical spine   shows no definite injury.     There is no significant change from the   prior exam.                                           Miguel Dibble, MD    05/19/2017 1:04 AM      CT Head WO Contrast   Final Result    CT scan of the head shows no hydrocephalus, herniation, or   intracranial hemorrhage. There is no visible acute intracranial   abnormality.                                                      Miguel Dibble, MD    05/19/2017 1:00 AM                  Attending Attestation:      The patient was seen and examined by the mid-level (physician's assistant or nurse practitioner), or fellow, and the plan of care was discussed with me. I agree with the plan as it was presented to me.  I have reviewed and agree with the final ED diagnosis.              Scribe Attestation:      I was acting as a Neurosurgeon for [employer], Lyman Speller, MD on [employer]  Treatment Team: Scribe: Damien Fusi, Trinna Post     I am the first provider for this patient and I personally performed the services documented. Treatment Team: Scribe: Damien Fusi, Trinna Post is scribing for me on [place],Candido J. This note and the patient instructions accurately reflect work and decisions made by me.  Sirron Francesconi, Lyman Speller, MD          Pasqualino Witherspoon, Lyman Speller, MD  05/19/17 [phone]

## 2017-05-19 ENCOUNTER — Emergency Department: Payer: BLUE CROSS/BLUE SHIELD

## 2017-05-19 LAB — GLUCOSE WHOLE BLOOD - POCT: Whole Blood Glucose POCT: 106 mg/dL — ABNORMAL HIGH (ref 70–100)

## 2017-05-19 MED ORDER — OXYCODONE-ACETAMINOPHEN 5-325 MG PO TABS
1.0000 | ORAL_TABLET | ORAL | 0 refills | Status: DC | PRN
Start: 2017-05-19 — End: 2017-07-17

## 2017-05-19 MED ORDER — CYCLOBENZAPRINE HCL 10 MG PO TABS
10.0000 mg | ORAL_TABLET | Freq: Once | ORAL | Status: AC
Start: 2017-05-19 — End: 2017-05-19
  Administered 2017-05-19: 02:00:00 10 mg via ORAL
  Filled 2017-05-19: qty 1

## 2017-05-19 MED ORDER — NALOXONE HCL 4 MG/0.1ML NA LIQD
NASAL | 0 refills | Status: DC
Start: 2017-05-19 — End: 2017-07-17

## 2017-05-19 MED ORDER — LIDOCAINE 5 % EX PTCH
1.0000 | MEDICATED_PATCH | Freq: Every day | CUTANEOUS | 0 refills | Status: AC
Start: 2017-05-19 — End: 2017-05-25

## 2017-05-19 NOTE — Discharge Instructions (Signed)
Dear Kent Jordan:    Thank you for choosing the [facility] Emergency Department, the premier emergency department in the [place] area.  I hope your visit today was EXCELLENT.    Specific instructions for your visit today:      Fall Prevention (Edu)    There was a 2003 study from the Journal of the [employer] on fall-related injuries. It shows that more than 1.8 million adults, aged 64 and older, were treated in emergency departments for such injuries. More than 421,000 were hospitalized. The most common fall injuries are head injuries. These in turn cause brain injury and fractures (broken bones). Hip fractures are the most serious types of broken bones that happen from falls. They lead to the most health problems and deaths.    To make the living area safer, older adults should:   Improve lighting throughout the home. Use night-lights to help see at night.   Have handrails put in on both sides of stairways.   Have grab bars put next to the toilet and in the shower. Also think about getting an elevated (high) toilet seat and a shower chair.   Use non-slip bath mats in the tub or shower.   Take out "throw rugs" to prevent tripping.   Avoid long robes to prevent tripping.   Wear well-fitted shoes or slippers. Loose footwear can make you shuffle. This makes you more likely to trip and fall. You can also buy inexpensive anti-slip socks.   Keep all electrical cords and small objects out of the pathway.   Any cane, walker or other assistive device used needs to be checked regularly. The devices must be used correctly to prevent injuries.   Move about at a pace that is comfortable for your ability. For example, do not rush to answer the doorbell or phone. Take your time.    Recent studies have identified some risk factors that make older adults more likely to have falls. Changing these risk factors helps to prevent falls.   Exercise: Regular physical activity or exercise make the body  stronger. They also improve balance.   Medicine Review: Follow up with your doctor and pharmacist as needed to review your medicines and any new changes. They can tell you if there are side-effects or drug interactions (if the medicines affect other medicines you are taking). If you are taking sedatives or sleeping pills, it may be possible to lower the dosage or number of medicines. These kinds of medicines can cause drowsiness and dizziness. This makes it more likely you will fall.   Vision Checks: Follow up with an eye doctor at least once a year to have your vision checked.                 If you do not continue to improve or your condition worsens, please contact your doctor or return immediately to the Emergency Department.    Sincerely,  Diegelmann, Lyman Speller, MD  Attending Emergency Physician  [facility] Emergency Department    ONSITE PHARMACY  Our full service onsite pharmacy is located in the ER waiting room.  Open 7 days a week from 9 am to 11 pm.  We accept all major insurances and prices are competitive with major retailers.  Ask your provider to print your prescriptions down to the pharmacy to speed you on your way home.    OBTAINING A PRIMARY CARE APPOINTMENT    Primary care physicians (PCPs, also known as primary care  doctors) are either internists or family medicine doctors. Both types of PCPs focus on health promotion, disease prevention, patient education and counseling, and treatment of acute and chronic medical conditions.    Call for an appointment with a primary care doctor.  Ask to see who is taking new patients.     [facility]  telephone:  [phone]  [email or web address]    DOCTOR REFERRALS  Call [phone] (available 24 hours a day, 7 days a week) if you need any further referrals and we can help you find a primary care doctor or specialist.  Also, available online at:  [email or web address]    YOUR CONTACT INFORMATION  Before leaving please  check with registration to make sure we have an up-to-date contact number.  You can call registration at [phone] to update your information.  For questions about your hospital bill, please call [phone].  For questions about your Emergency Dept Physician bill please call [phone].      FREE HEALTH SERVICES  If you need help with health or social services, please call 2-1-1 for a free referral to resources in your area.  2-1-1 is a free service connecting people with information on health insurance, free clinics, pregnancy, mental health, dental care, food assistance, housing, and substance abuse counseling.  Also, available online at:  http://www.[facility].org    MEDICAL RECORDS AND TESTS  Certain laboratory test results do not come back the same day, for example urine cultures.   We will contact you if other important findings are noted.  Radiology films are often reviewed again to ensure accuracy.  If there is any discrepancy, we will notify you.      Please call [phone] to pick up a complimentary CD of any radiology studies performed.  If you or your doctor would like to request a copy of your medical records, please call [phone].      ORTHOPEDIC INJURY   Please know that significant injuries can exist even when an initial x-ray is read as normal or negative.  This can occur because some fractures (broken bones) are not initially visible on x-rays.  For this reason, close outpatient follow-up with your primary care doctor or bone specialist (orthopedist) is required.    MEDICATIONS AND FOLLOWUP  Please be aware that some prescription medications can cause drowsiness.  Use caution when driving or operating machinery.    The examination and treatment you have received in our Emergency Department is provided on an emergency basis, and is not intended to be a substitute for your primary care physician.  It is important that your doctor checks you again and that you report any new or  remaining problems at that time.      24 HOUR PHARMACIES  The nearest 24 hour pharmacy is:    [facility]  [address]  [place], [place] [address]  [phone]      ASSISTANCE WITH INSURANCE    Affordable Care Act  [facility])  Call to start or finish an application, compare plans, enroll or ask a question.  [phone]  TTY: [phone]  Web:  Healthcare.gov    Help Enrolling in [facility]  Cover [place]  (469)641-6419 (TOLL-FREE)  ([phone] [phone] (TTY)  Web:  Http://www.coverva.org    Local Help Enrolling in the [facility]  [place] [facility]  ([phone] (MAIN)  Email:  health-help@nvfs .org  Web:  BlackjackMyths.is  Address:  [address] [place], [place] [address]    SEDATING MEDICATIONS  Sedating medications include strong pain medications (e.g. narcotics), muscle relaxers, benzodiazepines (used for anxiety and as muscle relaxers), Benadryl/diphenhydramine and other antihistamines for allergic reactions/itching, and other medications.  If you are unsure if you have received a sedating medication, please ask your physician or nurse.  If you received a sedating medication: DO NOT drive a car. DO NOT operate machinery. DO NOT perform jobs where you need to be alert.  DO NOT drink alcoholic beverages while taking this medicine.     If you get dizzy, sit or lie down at the first signs. Be careful going up and down stairs.  Be extra careful to prevent falls.     Never give this medicine to others.     Keep this medicine out of reach of children.     Do not take or save old medicines. Throw them away when outdated.     Keep all medicines in a cool, dry place. DO NOT keep them in your bathroom medicine cabinet or in a cabinet above the stove.    MEDICATION REFILLS  Please be aware that we cannot refill any prescriptions through the ER. If you need further treatment from what is provided at your ER visit, please follow up with your primary care doctor or your pain management  specialist.    [place]  Did you know [facility] has two freestanding ERs located just a few miles away?  [facility] [place] have short wait times, easy free parking directly in front of the building and top patient satisfaction scores - and the same Board Certified Emergency Medicine doctors as [facility].

## 2017-05-19 NOTE — ED Provider Notes (Signed)
[facility] EMERGENCY DEPARTMENT APP H&P         CLINICAL SUMMARY          Diagnosis:    .     Final diagnoses:   Closed fracture of one rib of right side, initial encounter   Fall (on) (from) other stairs and steps, initial encounter   Neck strain, initial encounter         MDM Notes:        MDM  Number of Diagnoses or Management Options  Closed fracture of one rib of right side, initial encounter: new, needed workup  Fall (on) (from) other stairs and steps, initial encounter: new, needed workup  Neck strain, initial encounter: new, needed workup     Amount and/or Complexity of Data Reviewed  Tests in the radiology section of CPT: ordered and reviewed  Discussion of test results with the performing providers: yes  Discuss the patient with other providers: yes  Independent visualization of images, tracings, or specimens: yes        Disposition:       ED Disposition     ED Disposition Condition Date/Time Comment    Discharge  Sun May 19, 2017  2:07 AM Kent Jordan discharge to home/self care.    Condition at disposition: Stable             Discharge         Discharge Prescriptions     Medication Sig Dispense Auth. Provider    lidocaine (LIDODERM) 5 % Place 1 patch onto the skin daily.for 6 days Remove & Discard patch within 12 hours or as directed by MD 6 patch Diegelmann, Lyman Speller, MD    oxyCODONE-acetaminophen (PERCOCET) 5-325 MG per tablet Take 1 tablet by mouth every 4 (four) hours as needed for Pain.for up to 12 doses 12 tablet Diegelmann, Lyman Speller, MD    naloxone [facility]) 4 MG/0.1ML nasal spray 1 spray intranasally. If pt does not respond or relapses into respiratory depression call 911. Give additional doses every 2-3 min. 2 each Diegelmann, Lyman Speller, MD                      CLINICAL INFORMATION        HPI:      Chief Complaint: Fall  .    Kent Jordan is a 66 y.o. male who presents with a c/o fall on steps.  States just PTA, he was at home, walking up steps, and believes his R  knee replacement and "my intoxication" caused him to miss a step and fall backwards on the steps.  States he went down, backwards, 6-8 steps.  Landed on a landing and stayed there until EMS arrived.  Admits to neck pain, mid-thoracic back pain, and lumbar pain.  States he has a hx of fusion to his cervical and lumbar spine and admits he always has pain there so it is difficult for him to assess his current pain there, but admits his mid-back is acutely painful and it is not normal for him to have pain there.  Admits his pain is tolerable, but his pain is worse w/ any positional changes, especially in his thoracic spine.    Denies hitting head, LOC, HA, n/v, abd pain.  Takes a daily ASA; denies anticoagulant use.     History obtained from: Patient      ROS:      Positive and negative ROS elements as per HPI.  All  other systems reviewed and negative.      Physical Exam:      Pulse 83  BP 109/71  Resp 19  SpO2 97 %  Temp 98.1 F (36.7 C)    Physical Exam   Constitutional: He is oriented to person, place, and time. He appears well-developed and well-nourished. He is active and cooperative.  Non-toxic appearance. He does not have a sickly appearance. He does not appear ill. No distress. Cervical collar and backboard in place.   Strong smell of etoh     HENT:   Head: Normocephalic and atraumatic.   Right Ear: External ear normal.   Left Ear: External ear normal.   Eyes: Pupils are equal, round, and reactive to light. Conjunctivae and EOM are normal.   Neck: Spinous process tenderness and muscular tenderness present.   c-collar removed by attending after CT completed   Cardiovascular: Normal rate, regular rhythm and normal heart sounds.  Exam reveals no gallop and no friction rub.    No murmur heard.  Pulmonary/Chest: Effort normal. No respiratory distress. He has no wheezes. He has no rales.   Abdominal: Soft. He exhibits no distension and no mass. There is no tenderness. There is no rebound and no guarding. No  hernia.   Musculoskeletal:        Thoracic back: He exhibits decreased range of motion, tenderness and bony tenderness.        Lumbar back: He exhibits decreased range of motion, tenderness and bony tenderness.        Back:    No abrasions/contusions/ecchymosis/deformity/bleeding   Neurological: He is alert and oriented to person, place, and time. Coordination normal.   Skin: Skin is warm and dry. No rash noted. He is not diaphoretic. No erythema. No pallor.   Psychiatric: He has a normal mood and affect. His behavior is normal. Judgment and thought content normal. His speech is not slurred. Cognition and memory are normal. He is communicative. He is attentive.   Nursing note and vitals reviewed.                  PAST HISTORY        Primary Care Provider: Erasmo Score, MD        PMH/PSH:    .     Past Medical History:   Diagnosis Date   . Arrhythmia     when on ARB   . Arthritis     psoriatic and osteoarthritis    . Back pain    . Blood disorder 11/2014    Hemochromatosis   . Diabetes mellitus 2016    started metformin 05/2016 d/t A1C 7.1 - now checking BS 2x/day - average 70-110 in the last week - A1C on 06/29/16 = 6.3 , has also lost 16 lbs since 05/2016   . Difficulty in walking(719.7)     Uses cane to ambulate   . Elevated liver function tests     had abd u/s 07/05/16    . Failed back syndrome    . Fatty liver    . Hemochromatosis     phlebotomy every 3 months - last done 02/2016   . High cholesterol    . Hypertensive disorder 06/2016    norvasc recently increased to 10 mg daily - now controlled on meds - most recent BP 117/73   . Low back pain    . Lung nodule 2012    benign - was monitored x  3 yrs by Dr. Bernita Raisin (Pulm) -  signed off from pulmonology - no further f/u required since 2015   . Neuropathy     Tingling left foot, numbness left toes   . Psoriasis    . Psoriatic arthritis     tx w/ enbrel   . S/P insertion of spinal cord stimulator 2015   . Sleep apnea     mild - does not have CPAP - was unable to tolerate,  less snoring since turboplasty       He has a past surgical history that includes Lumbar fusion (10/2011); Microdiscectomy lumbar (06/2011, 07/2011); Knee arthrocentesis (1983); Abdominal surgery (03/1971); CATARACT EXT.WITH IOL (Bilateral, 2009); Fracture surgery (1993); INSERTION, SPINAL CORD STIMULATOR GENERATOR (N/A, 05/07/2014); Septoplasty (10/05/2015); Skin biopsy (11/2015); ARTHROSCOPY, KNEE (Right, 03/21/2016); and ARTHROPLASTY, KNEE, TOTAL (Right, 08/20/2016).      Social/Family History:      He reports that he quit smoking about 5 years ago. He has a 41.00 pack-year smoking history. He has never used smokeless tobacco. He reports that he drinks about 1.2 oz of alcohol per week . He reports that he does not use drugs.    Family History   Problem Relation Age of Onset   . Heart disease Mother    . Heart disease Father          Listed Medications on Arrival:    .     Discharge Medication List as of 05/19/2017  2:07 AM      CONTINUE these medications which have NOT CHANGED    Details   amLODIPine (NORVASC) 10 MG tablet Starting Mon 09/03/2016, Historical Med      aspirin 81 MG tablet Take 81 mg by mouth daily., Historical Med      celecoxib (CELEBREX) 200 MG capsule 1 (ONE) CAPSULE BY MOUTH TWO TIMES DAILY, Historical Med      clobetasol (TEMOVATE) 0.05 % cream 1 APP 2 TIMES A DAY TO AFFECTED AREAS APPLIED TOPICALLY 30 DAYS, Historical Med      cyclobenzaprine (FLEXERIL) 10 MG tablet Take 10 mg by mouth daily.   , Until Discontinued, Historical Med      diazePAM (VALIUM) 5 MG tablet TAKE 1 TABLET BY MOUTH EVERY DAY, Historical Med      DULoxetine (CYMBALTA) 60 MG capsule Take 60 mg by mouth daily., Until Discontinued, Historical Med      ENBREL SURECLICK 50 MG/ML Solution Auto-injector Inject as directed once a week.Thursdays    , Starting Thu 12/08/2015, Historical Med      ezetimibe (ZETIA) 10 MG tablet Take 1 tablet (10 mg total) by mouth daily., Starting Tue 03/12/2017, Normal      folic acid (FOLVITE) 400 MCG  tablet Take 400 mcg by mouth daily., Until Discontinued, Historical Med      FREESTYLE LITE test strip 1 each by Other route 2 (two) times daily., Starting Wed 02/06/2017, Normal      hydroCHLOROthiazide (HYDRODIURIL) 25 MG tablet Take 25 mg by mouth daily.   , Starting 12/15/2015, Until Discontinued, Historical Med      Lancets (FREESTYLE) lancets 1 each by Other route 2 (two) times daily., Starting Tue 03/12/2017, Normal      metFORMIN (GLUCOPHAGE) 500 MG tablet Take 1 tablet (500 mg total) by mouth every morning with breakfast., Starting Tue 03/12/2017, Normal      rosuvastatin (CRESTOR) 5 MG tablet Take 1 tablet (5 mg total) by mouth daily., Starting Tue 03/12/2017, Normal      traZODone (DESYREL) 50 MG tablet Take 50 mg by mouth  nightly as needed.    , Starting 10/25/2015, Until Discontinued, Historical Med            Allergies: He is allergic to angiotensin receptor blockers; ciprofloxacin; ace inhibitors; and gabapentin.            VISIT INFORMATION        Clinical Course in the ED:            Medications Given in the ED:    .     ED Medication Orders     Start Ordered     Status Ordering Provider    05/19/17 0130 05/19/17 0129  cyclobenzaprine (FLEXERIL) tablet 10 mg  Once     Route: Oral  Ordered Dose: 10 mg     Last MAR action:  Given DIEGELMANN, LAURA  E    05/19/17 0000 05/18/17 2359  fentaNYL (PF) (SUBLIMAZE) injection 50 mcg  Once     Route: Intravenous  Ordered Dose: 50 mcg     Last MAR action:  Given Jamin Panther L    05/18/17 2350 05/18/17 2349  sodium chloride 0.9 % bolus 500 mL  Once     Route: Intravenous  Ordered Dose: 500 mL     Last MAR action:  Stopped Nickoli Bagheri L    05/18/17 2348 05/18/17 2347  ondansetron (ZOFRAN) injection 4 mg  Once     Route: Intravenous  Ordered Dose: 4 mg     Last MAR action:  Given Erynne Kealey L            Procedures:      Procedures      Interpretations:    O2 sat-           saturation: 97 %; Oxygen use: room air; Interpretation: Normal                   RESULTS         Lab Results:      Results     Procedure Component Value Units Date/Time    Glucose Whole Blood - POCT [[id number]]  (Abnormal) Collected:  05/19/17 0012     Updated:  05/19/17 0030     POCT - Glucose Whole blood 106 (H) mg/dL               Radiology Results:      CT Lumbar Spine without Contrast   Final Result    CT scan of the lumbar spine shows no definite acute   traumatic abnormality. No definite acute fractures are seen.         The   lumbar spine shows no definite acute injury. Lumbar fusion changes are   noted.     Apparent loosening around the ankle screws of L3 appears   unchanged.     Moderate multilevel canal stenosis is noted. A   neurostimulator device is noted.                                              Miguel Dibble, MD    05/19/2017 1:38 AM      CT Thoracic Spine without Contrast   Final Result    CT scan of the thoracic spine shows an acute nondisplaced   fracture of the right third posterior rib without other definite   injuries. No other definite fractures are seen.  Minimal spinal   degenerative changes are noted. The thoracic spine shows no apparent   injury. A neurostimulator device is noted.     Coronary artery   calcifications are noted.                                      Miguel Dibble, MD    05/19/2017 1:29 AM      CT Cervical Spine without Contrast   Final Result    CT scan of the cervical spine shows no definite acute   abnormality. No fracture or dislocation is seen.     Spinal degenerative   changes are noted with canal stenosis as described. Correlate clinically   to determine whether further imaging is indicated. The cervical spine   shows no definite injury.     There is no significant change from the   prior exam.                                           Miguel Dibble, MD    05/19/2017 1:04 AM      CT Head WO Contrast   Final Result    CT scan of the head shows no hydrocephalus, herniation, or   intracranial hemorrhage. There is no visible acute intracranial   abnormality.                                                       Miguel Dibble, MD    05/19/2017 1:00 AM                  Scribe Attestation:      No scribe involved in the care of this patient                            Erick Blinks, [place]  05/19/17 1350

## 2017-05-23 ENCOUNTER — Inpatient Hospital Stay: Payer: BLUE CROSS/BLUE SHIELD | Admitting: Physical Therapist

## 2017-05-24 ENCOUNTER — Inpatient Hospital Stay: Payer: BLUE CROSS/BLUE SHIELD

## 2017-05-24 ENCOUNTER — Encounter (INDEPENDENT_AMBULATORY_CARE_PROVIDER_SITE_OTHER): Payer: Self-pay

## 2017-05-28 ENCOUNTER — Inpatient Hospital Stay: Payer: BLUE CROSS/BLUE SHIELD | Attending: Orthopaedic Surgery | Admitting: Physical Therapist

## 2017-05-28 DIAGNOSIS — M5412 Radiculopathy, cervical region: Secondary | ICD-10-CM | POA: Insufficient documentation

## 2017-05-28 NOTE — PT/OT Therapy Note (Signed)
Name: JAIMES ECKERT Age: 66 y.o.   Referring Physician: Harriet Masson, MD   Date of Injury: 03/24/2017  Date Care Plan Established/Reviewed: 04/23/2017  Date Treatment Started: 04/23/2017  Visit Count: 7   Diagnosis:   1. Cervical radiculitis        Subjective     Pain   Current pain rating: 6  At best pain rating: 0  I fell down the stairs recently.  R 3rd rib fx, sprained my neck.  Back is killing me.      Social Support/Occupation  Lives in: multiple level home  Lives with: spouse  Occupation: retired                Building surveyor   SL arm circles R x 10 reps CW/CCW each direction    Neuromuscular Re-Education   R scap PE to eccentrically length UT  R scap AE/PD     Manual Therapy   BC R thoracic / lumbar spinal groove  STMobs to R then L lats, paraspinals   Ribcage mobilization with R pelvic AE/PD , scap AE/PD R then L          Assessment   Focused on ribcage mobility to improve SL sleeping tolerance, and to improve cervical mobility.  Continue to progress with UQ and LQ function to regain mobilities after fall down stairs.    Plan   Continue pero POC  SCAAT testring prior to cervical spine treatment to test for ligamentous stability       Goals    Goal 1:  Patient will improve score on FOTO to demonstrate improved function.    Sessions:  22      Goal 2:  Improve bilateral cervical rotation to >=60 degrees to facilitate safety while driving  01/26/08 Progressing with thoracic girdle mobility to improve AROM. MG   Sessions:  22   Progression:  progressing       Goal 3:  Pt will demo efficient axial elongation, hip hinge with neutral trunk and T-rex arms to keep load close to lift 10 lbs from knees to chest without c/o pain to increase safety with lifting for ADL's  05/01/17 Requires verbal and tactile cues to avoid compensations and pain MG   Sessions:  22      Goal 4:  Patient will score >=4/5 on resisted pivot prone R UE to facilitate painfree use of R UE during dressing and bathing activities.                                 [name], PT

## 2017-05-31 ENCOUNTER — Inpatient Hospital Stay: Payer: BLUE CROSS/BLUE SHIELD | Attending: Orthopaedic Surgery

## 2017-05-31 DIAGNOSIS — M5412 Radiculopathy, cervical region: Secondary | ICD-10-CM | POA: Insufficient documentation

## 2017-05-31 DIAGNOSIS — M5440 Lumbago with sciatica, unspecified side: Secondary | ICD-10-CM

## 2017-05-31 NOTE — PT/OT Therapy Note (Signed)
Name: Kent Jordan Age: 66 y.o.   Referring Physician: Harriet Masson, MD   Date of Injury: 03/24/2017  Date Care Plan Established/Reviewed: 04/23/2017  Date Treatment Started: 04/23/2017  Visit Count: 8   Diagnosis:   1. Cervical radiculitis    2. Acute bilateral low back pain with sciatica, sciatica laterality unspecified        Subjective     Social Support/Occupation  Lives in: multiple level home  Lives with: spouse  Occupation: retired    Pt stated that the neck has been feeling okay, but the low back has been feeling stiff and painful today. Pain in the neck is currently a 2/10 and pain in the low back is 4/10.      Objective   R lower ribs fixated in a slightly elevated position when pt is breathing.    Positive gillet's on the R SIJ.     Moderate pain in the R low back with mild pressure to the R PSIS and lumbar paraspinals.              Treatment     Therapeutic Exercises   Review of arm circles with verbal cueing to improve trunk rotation during the exercise.     Pt educated on anatomical structures that are relevant to his current issue.     Manual Therapy   Bony contour clearing of the R SIJ.    STM to the R QL, R lumbar paraspinals, and intercostal spaces of ribs 7-12.     Grade III inferior mobilizations of ribs 7-12.             ---      ---   Total Time   Timed Minutes  40 minutes   Total Time  40 minutes        Assessment   Pt had no pain at the R PSIS with moderate pressure and he had good rib elevation and depression with deep breaths at the end of the session. Pt also had improved R SIJ mobility post BCC.   Plan   Continue per POC.       Goals    Goal 1:  Patient will improve score on FOTO to demonstrate improved function.    Sessions:  22      Goal 2:  Improve bilateral cervical rotation to >=60 degrees to facilitate safety while driving  09/29/08 Progressing with thoracic girdle mobility to improve AROM. MG   Sessions:  22   Progression:  progressing       Goal 3:  Pt will demo efficient axial  elongation, hip hinge with neutral trunk and T-rex arms to keep load close to lift 10 lbs from knees to chest without c/o pain to increase safety with lifting for ADL's  05/01/17 Requires verbal and tactile cues to avoid compensations and pain MG   Sessions:  22      Goal 4:  Patient will score >=4/5 on resisted pivot prone R UE to facilitate painfree use of R UE during dressing and bathing activities.                                Lawernce Ion, PT, DPT

## 2017-06-03 ENCOUNTER — Inpatient Hospital Stay: Payer: BLUE CROSS/BLUE SHIELD | Attending: Orthopaedic Surgery | Admitting: Physical Therapist

## 2017-06-03 DIAGNOSIS — M5412 Radiculopathy, cervical region: Secondary | ICD-10-CM | POA: Insufficient documentation

## 2017-06-03 NOTE — PT/OT Therapy Note (Signed)
Name: Kent Jordan Age: 66 y.o.   Referring Physician: Harriet Masson, MD   Date of Injury: 03/24/2017  Date Care Plan Established/Reviewed: 04/23/2017  Date Treatment Started: 04/23/2017  Visit Count: 9   Diagnosis:   1. Cervical radiculitis        Subjective     Pain   I was picking up bags of groceries and I didn't want to make 2 trips so I carried 4 bags in my L hand.  LBP, neck, knee are feeling good.  The L ribs below my L shoulder blade in the front and in the back, especially in the back are painful when I move, breathe, and carry things.  .  I had trouble sleeping on the L side.  I wonder if I broke that rib too.  I haven't been coughing up blood and no GI issues.      Social Support/Occupation  Lives in: multiple level home  Lives with: spouse  Occupation: retired    Objective     L ribcage with poor lengthening with R SB   Percussion to L 7th rib with onset of sharp pain             Treatment     Therapeutic Exercises   Attempted standing SB stretches but with reports of sharp L lower rib pain around rib 7 so discontinued.  Patient education:  Instructed to contact MD re: L rib pain.  Rationale for treating above and below painful rib on L.  Painful rib will not be mobilized 2 to possibility of fracture.  Patient in agreement.      Neuromuscular Re-Education   Mass elongation L PHs, progressed to COI within painfree range    Manual Therapy   BC L thoracic spinal groove  Muscle bending thoracic paraspinals T4-T12 L  Skin/ superficial fasica L ribcage  STMobs to L lats  BC L ribs 6-12   Rib mobs L with pelvic AE/PD and scap AE/PD except aprox rib 7 2 to sharp pain  Sitting:  STMobs to bilateral thoracic/ lumbar paraspinals, R lats, rhomboids, mid and lower traps        ---      ---   Total Time   Timed Minutes  44 minutes   Total Time  44 minutes        Assessment   Focused on improving ribcage mobility to facilitate painfree breathing and walking.  Patient with improved R SB excursion without onset of L  posterior rib pain at end of session today.  Progress thoracic mobility, POE scap stability if appropriate and scap NMR to facilitate ability to carry heavy loads.    Plan   Continue per POC  Progress note for 10th visit  FOTO      Goals    Goal 1:  Patient will improve score on FOTO to demonstrate improved function.    Sessions:  22      Goal 2:  Improve bilateral cervical rotation to >=60 degrees to facilitate safety while driving  09/29/08 Progressing with thoracic girdle mobility to improve AROM. MG   Sessions:  22   Progression:  progressing       Goal 3:  Pt will demo efficient axial elongation, hip hinge with neutral trunk and T-rex arms to keep load close to lift 10 lbs from knees to chest without c/o pain to increase safety with lifting for ADL's  05/01/17 Requires verbal and tactile cues to avoid compensations and pain MG  Sessions:  22      Goal 4:  Patient will score >=4/5 on resisted pivot prone R UE to facilitate painfree use of R UE during dressing and bathing activities.                                [name], PT

## 2017-06-06 ENCOUNTER — Inpatient Hospital Stay: Payer: BLUE CROSS/BLUE SHIELD | Admitting: Physical Therapist

## 2017-06-11 ENCOUNTER — Inpatient Hospital Stay: Payer: BLUE CROSS/BLUE SHIELD | Admitting: Physical Therapist

## 2017-06-14 ENCOUNTER — Inpatient Hospital Stay: Payer: BLUE CROSS/BLUE SHIELD

## 2017-06-17 ENCOUNTER — Inpatient Hospital Stay: Payer: BLUE CROSS/BLUE SHIELD | Attending: Orthopaedic Surgery | Admitting: Physical Therapist

## 2017-06-17 DIAGNOSIS — M5412 Radiculopathy, cervical region: Secondary | ICD-10-CM | POA: Insufficient documentation

## 2017-06-17 DIAGNOSIS — M5440 Lumbago with sciatica, unspecified side: Secondary | ICD-10-CM

## 2017-06-17 NOTE — PT/OT Therapy Note (Signed)
Name: Kent Jordan Age: 66 y.o.   Referring Physician: Harriet Masson, MD   Date of Injury: 03/24/2017  Date Care Plan Established/Reviewed: 04/23/2017  Date Treatment Started: 04/23/2017  Visit Count: 10   Diagnosis:   1. Cervical radiculitis    2. Acute right-sided low back pain with sciatica, sciatica laterality unspecified        Subjective     Pain   R glut pain with radiating pain down IT band, I have sharp pain with (sit to stand transfer) and it's hard for me to stand up.  .  After I fell down the steps, my neck felt fine.      Social Support/Occupation  Lives in: multiple level home  Lives with: spouse  Occupation: retired                Building surveyor   LTR to L then R x 10 reps each leg  LE march x 10 reps each direction x 2 sets   Bridge x 10 reps   Prone knee flexion 10 reps x 2 sets    Neuromuscular Re-Education   R pelvic AE/PD RI to improve pelvic mobility  Hooklying:  Approximation through bilateral femurs to improve stability, progressed to traction with increased hip stability.    RS in bridge position   Resisted LTR above and below fusion    Manual Therapy   STMobs to R glut  BC R sacral ILA, R SI joint  R inominate flexion mob  R LE ext/ abd to improve mobility of nerve  R inominate extension mobs        ---      ---   Total Time   Timed Minutes  45 minutes   Total Time  45 minutes        Assessment   Patient able to perform independent sit to stand without R SI pain at end of session today.  Continue to improve Bilateral inominate mobility to improve sit to stand transfer, pelvic NM control to improve gait mechanics  Plan   Continue per POC      Goals    Goal 1:  Patient will improve score on FOTO to demonstrate improved function.    Sessions:  22      Goal 2:  Improve bilateral cervical rotation to >=60 degrees to facilitate safety while driving  02/24/12 Progressing with thoracic girdle mobility to improve AROM. MG   Sessions:  22   Progression:  progressing       Goal 3:   Pt will demo efficient axial elongation, hip hinge with neutral trunk and T-rex arms to keep load close to lift 10 lbs from knees to chest without c/o pain to increase safety with lifting for ADL's  05/01/17 Requires verbal and tactile cues to avoid compensations and pain MG   Sessions:  22      Goal 4:  Patient will score >=4/5 on resisted pivot prone R UE to facilitate painfree use of R UE during dressing and bathing activities.                                [name], PT

## 2017-06-17 NOTE — Progress Notes (Signed)
Name: Kent Jordan Age: 66 y.o.   Referring Physician: Harriet Masson, MD   Date of Injury: 03/24/2017  Date Care Plan Established/Reviewed: 04/23/2017  Date Treatment Started: 04/23/2017  Visit Count: 10   Diagnosis:   1. Cervical radiculitis    2. Acute right-sided low back pain with sciatica, sciatica laterality unspecified                             ---      ---   Total Time   Timed Minutes  45 minutes   Total Time  45 minutes           Goals    Goal 1:  Patient will improve score on FOTO to demonstrate improved function.    Sessions:  22      Goal 2:  Improve bilateral cervical rotation to >=60 degrees to facilitate safety while driving  0/9/81 Progressing with thoracic girdle mobility to improve AROM. MG   Sessions:  22   Progression:  progressing       Goal 3:  Pt will demo efficient axial elongation, hip hinge with neutral trunk and T-rex arms to keep load close to lift 10 lbs from knees to chest without c/o pain to increase safety with lifting for ADL's  05/01/17 Requires verbal and tactile cues to avoid compensations and pain MG   Sessions:  22      Goal 4:  Patient will score >=4/5 on resisted pivot prone R UE to facilitate painfree use of R UE during dressing and bathing activities.                                [name], PT

## 2017-06-20 ENCOUNTER — Inpatient Hospital Stay: Payer: BLUE CROSS/BLUE SHIELD | Attending: Orthopaedic Surgery | Admitting: Physical Therapist

## 2017-06-20 DIAGNOSIS — M5412 Radiculopathy, cervical region: Secondary | ICD-10-CM | POA: Insufficient documentation

## 2017-06-20 NOTE — PT/OT Therapy Note (Signed)
Name: Kent Jordan Age: 66 y.o.   Referring Physician: Harriet Masson, MD   Date of Injury: 03/24/2017  Date Care Plan Established/Reviewed: 04/23/2017  Date Treatment Started: 04/23/2017  Visit Count: 11   Diagnosis:   1. Cervical radiculitis        Subjective     Pain   I have been doing my exercises, they aren't helping!     Social Support/Occupation  Lives in: multiple level home  Lives with: spouse  Occupation: retired                Building surveyor   Instructed in pivoting feet with standing whole body rotations vs keeping feet planted  Supine:   Grasshopper FMs x 15 reps each leg  SKTC stretch 30 sec holds x 2 reps each leg    Neuromuscular Re-Education   Instructed in diaphragmatic breathing to facilitate parasympathetic system to decrease guarding with TE  Hooklying: RS to facilitate hip and core stabilizers    Manual Therapy   Skin/ superficial fascia bilateral lumbar/ thoracic spine   Cleared lumbar spinal grooves   STMobs to lower thoracic/ lumbar paraspinals   STMobs iliacus with free ball FM bilaterally        ---      ---   Total Time   Timed Minutes  41 minutes   Total Time  41 minutes        Assessment   Pelvic shears pre treatment with contralateral SI joints, minimal excursion.  Post session with improved excursion but still with SI joint pain contralaterally.  Continue with cascade of techniques to decrease ST / joint restrictions resulting from fall down the stairs  Plan   Continue per POC  Long axis distraction        Goals    Goal 1:  Patient will improve score on FOTO to demonstrate improved function.    Sessions:  22      Goal 2:  Improve bilateral cervical rotation to >=60 degrees to facilitate safety while driving  03/02/61 Progressing with thoracic girdle mobility to improve AROM. MG   Sessions:  22   Progression:  progressing       Goal 3:  Pt will demo efficient axial elongation, hip hinge with neutral trunk and T-rex arms to keep load close to lift 10 lbs from knees to  chest without c/o pain to increase safety with lifting for ADL's  05/01/17 Requires verbal and tactile cues to avoid compensations and pain MG   Sessions:  22      Goal 4:  Patient will score >=4/5 on resisted pivot prone R UE to facilitate painfree use of R UE during dressing and bathing activities.                                [name], PT

## 2017-06-24 ENCOUNTER — Inpatient Hospital Stay: Payer: BLUE CROSS/BLUE SHIELD | Attending: Orthopaedic Surgery | Admitting: Physical Therapist

## 2017-06-24 DIAGNOSIS — M5412 Radiculopathy, cervical region: Secondary | ICD-10-CM | POA: Insufficient documentation

## 2017-06-24 DIAGNOSIS — M5440 Lumbago with sciatica, unspecified side: Secondary | ICD-10-CM

## 2017-06-24 DIAGNOSIS — M25561 Pain in right knee: Secondary | ICD-10-CM

## 2017-06-24 NOTE — PT/OT Therapy Note (Addendum)
Name: Kent Jordan Age: 66 y.o.   Referring Physician: Harriet Masson, MD   Date of Injury: 03/24/2017  Date Care Plan Established/Reviewed: 06/24/2017  Date Treatment Started: 04/23/2017  Visit Count: 12   Diagnosis:   1. Cervical radiculitis    2. Anterior knee pain, right    3. Acute bilateral low back pain with sciatica, sciatica laterality unspecified        Subjective     Pain   My knee has bothering me.  I was vacuuming and I had to kneel down, I kneeled down on a cord.      Social Support/Occupation  Lives in: multiple level home  Lives with: spouse  Occupation: retired                Building surveyor   Min squats : verbal cues for posterior translation of pelvis x 20 reps     Neuromuscular Re-Education   R pelvic PD end range holds, progressed to COI, added R LE ext/ abd/ IR   Mass elongation  Push cart verbal cues for bilateral PD     Manual Therapy   STMobs to R patellar tendon , quads   Distraction to R patellar with ankle pump FM   AP tibia on femur then femur tibia   Superior and infeiror glides of R patella at end range TKE and flexion         ---      ---   Total Time   Timed Minutes  41 minutes   Total Time  41 minutes        Assessment   Pre tx: painful squats, post tx: able to perform efficient squats painfree. Continue to improve lumbopelvic mobility/ stability to improve gait mechanics and transfers.    Plan   Continue per POC  Lifts      Goals    Goal 1:  Patient will improve score on FOTO to demonstrate improved function.    Sessions:  18   Progression:  progressing      Goal 2:  Improve bilateral cervical rotation to >=60 degrees to facilitate safety while driving  09/29/08 Progressing with thoracic girdle mobility to improve AROM. MG   Sessions:  18   Progression:  progressing       Goal 3:  Pt will demo efficient axial elongation, hip hinge with neutral trunk and T-rex arms to keep load close to lift 10 lbs from knees to chest without c/o pain to increase safety with lifting  for ADL's  05/01/17 Requires verbal and tactile cues to avoid compensations and pain MG   Sessions:  18      Goal 4:  Patient will score >=4/5 on resisted pivot prone R UE to facilitate painfree use of R UE during dressing and bathing activities.   Sessions:  18          Goal 5:  Pt will report ability to amb with symmetrical gait pattern without AD x 30 mins at a time to ease ADL performance daily   Sessions:  15   Progression:  new      Goal 6:  Patient will improve lumbopelvic mobility above and below fusions to facilitate bed mobilities with >=3/10 pain   Sessions:  15   Progression:  new      Goal 7:  Patient will demonstrate LPM >4/5 in all diagonals to facilitate unrestricted standing activities during ADL >=1 hours ad lib throughout  day   Sessions:  18   Progression:  new                   [name], PT

## 2017-06-27 ENCOUNTER — Inpatient Hospital Stay: Payer: BLUE CROSS/BLUE SHIELD | Attending: Orthopaedic Surgery | Admitting: Physical Therapist

## 2017-06-27 DIAGNOSIS — M5412 Radiculopathy, cervical region: Secondary | ICD-10-CM

## 2017-06-27 NOTE — PT/OT Therapy Note (Signed)
Name: Kent Jordan Age: 66 y.o.   Referring Physician: Harriet Masson, MD   Date of Injury: 03/24/2017  Date Care Plan Established/Reviewed: 04/23/2017  Date Treatment Started: 04/23/2017  Visit Count: 13   Diagnosis:   1. Cervical radiculitis        Subjective     Pain   Feeling better with the knee.  I'm going to walk around in [place].  My carpal tunnel is killing R more than L especially middle 3.  I'm going to my arthritis MD next week.  I have been doing a lot of chores at home, my knee has been feeling better but it is giving me trouble with everything that I am doing on ladders, lifting things, etc.      Social Support/Occupation  Lives in: multiple level home  Lives with: spouse  Occupation: retired                Building surveyor   Wall lunge 30 sec holds x 2 reps bilaterally  Squats with verbal cues for posterior translation of pelvis 20 reps     Neuromuscular Re-Education   Resisted heel slides x 15 reps   R pelvic PD end range holds with R LE ext/ abd/ IR, COI with knee and hip pivot     Manual Therapy   Scar mobs R knee   STMobs to R patellar tendon, medial/ lateral retinaculum, quads        ---      ---   Total Time   Timed Minutes  42 minutes   Total Time  42 minutes        Assessment   Improved ST/ patellar mobility and NM control R LE at end of session today to facilitate independent transfers, lifts from floor, and stair mobility     Goals    Goal 1:  Patient will improve score on FOTO to demonstrate improved function.    Sessions:  22      Goal 2:  Improve bilateral cervical rotation to >=60 degrees to facilitate safety while driving  09/29/08 Progressing with thoracic girdle mobility to improve AROM. MG   Sessions:  22   Progression:  progressing       Goal 3:  Pt will demo efficient axial elongation, hip hinge with neutral trunk and T-rex arms to keep load close to lift 10 lbs from knees to chest without c/o pain to increase safety with lifting for ADL's  05/01/17 Requires verbal  and tactile cues to avoid compensations and pain MG   Sessions:  22      Goal 4:  Patient will score >=4/5 on resisted pivot prone R UE to facilitate painfree use of R UE during dressing and bathing activities.                                [name], PT

## 2017-07-03 ENCOUNTER — Inpatient Hospital Stay: Payer: BLUE CROSS/BLUE SHIELD | Attending: Orthopaedic Surgery | Admitting: Physical Therapist

## 2017-07-03 DIAGNOSIS — M5412 Radiculopathy, cervical region: Secondary | ICD-10-CM | POA: Insufficient documentation

## 2017-07-03 NOTE — PT/OT Therapy Note (Signed)
Name: TUKKER BYRNS Age: 66 y.o.   Referring Physician: Harriet Masson, MD   Date of Injury: 03/24/2017  Date Care Plan Established/Reviewed: 04/23/2017  Date Treatment Started: 04/23/2017  Visit Count: 14   Diagnosis:   1. Cervical radiculitis        Subjective     Pain   Current pain rating: 5  At best pain rating: 4  At worst pain rating: 8  Long flight this weekend, my back is hurting me.      Social Support/Occupation  Lives in: multiple level home  Lives with: spouse  Occupation: retired                Surveyor, quantity Therapy   L  And R SL:  Skin/ superficial fascia lumbar spine, sacrum   STMObs to R then L glutes, QL, piriformis , glut med  BC inferior border of ribs bilat, L1-L5 spinous processes, SI joints  Visceral mobs with LTR        ---      ---   Total Time   Timed Minutes  40 minutes   Total Time  40 minutes        Assessment   Pre tx: painful transfers, bed mobilities.  Post tx: improved body mechanics with supine to sit and sit to transfers with reports of less pain.  Continue to improve mobility, core strength to facilitate efficient walking and transfers.    Plan   Continue per POC      Goals    Goal 1:  Patient will improve score on FOTO to demonstrate improved function.    Sessions:  22      Goal 2:  Improve bilateral cervical rotation to >=60 degrees to facilitate safety while driving  01/26/08 Progressing with thoracic girdle mobility to improve AROM. MG   Sessions:  22   Progression:  progressing       Goal 3:  Pt will demo efficient axial elongation, hip hinge with neutral trunk and T-rex arms to keep load close to lift 10 lbs from knees to chest without c/o pain to increase safety with lifting for ADL's  05/01/17 Requires verbal and tactile cues to avoid compensations and pain MG   Sessions:  22      Goal 4:  Patient will score >=4/5 on resisted pivot prone R UE to facilitate painfree use of R UE during dressing and bathing activities.                                [name], PT

## 2017-07-05 ENCOUNTER — Inpatient Hospital Stay: Payer: BLUE CROSS/BLUE SHIELD | Admitting: Physical Therapist

## 2017-07-08 ENCOUNTER — Inpatient Hospital Stay: Payer: BLUE CROSS/BLUE SHIELD | Admitting: Physical Therapist

## 2017-07-11 ENCOUNTER — Inpatient Hospital Stay: Payer: BLUE CROSS/BLUE SHIELD | Attending: Orthopaedic Surgery | Admitting: Physical Therapist

## 2017-07-11 DIAGNOSIS — M5412 Radiculopathy, cervical region: Secondary | ICD-10-CM | POA: Insufficient documentation

## 2017-07-11 NOTE — PT/OT Therapy Note (Signed)
Name: Kent Jordan Age: 66 y.o.   Referring Physician: Harriet Masson, MD   Date of Injury: 03/24/2017  Date Care Plan Established/Reviewed: 04/23/2017  Date Treatment Started: 04/23/2017  Visit Count: 15   Diagnosis:   1. Cervical radiculitis        Subjective     Pain   Current pain rating: 3  At best pain rating: 0  At worst pain rating: 5  I had a sinus infection and I am on antibiotics.  I am feeling better than I was before.  Since I haven't been pushing myself I have been pretty good.  I still have the general pain across my back.  Turning in bed is difficult.  L3-S1  Objective:  Negative slump and extension sitting    Social Support/Occupation  Lives in: multiple level home  Lives with: spouse  Occupation: retired                Surveyor, quantity Therapy   BC bilateral iliac crest and inferior border of 12th rib  STMobs to bilateral thoracic and lumbar paraspinals with L LTR  Cleared thoracic and lumbar spinal grooves with L LE IR/ER  Inferior glide L inominate grade 4 with LTR   STMobs to hamstrings with grasshopper FMs bilaterally        ---      ---   Total Time   Timed Minutes  45 minutes   Total Time  45 minutes        Assessment   Improved forward flexion by 2 inches at end of session today, full standing rotation to L and R to improve bed mobilities.  Progress core strength and improve body mechanics with transfers, lifts to improve independence with ADLs and household chores.    Plan   Continue per POC      Goals    Goal 1:  Patient will improve score on FOTO to demonstrate improved function.    Sessions:  22      Goal 2:  Improve bilateral cervical rotation to >=60 degrees to facilitate safety while driving  09/29/08 Progressing with thoracic girdle mobility to improve AROM. MG   Sessions:  22   Progression:  progressing       Goal 3:  Pt will demo efficient axial elongation, hip hinge with neutral trunk and T-rex arms to keep load close to lift 10 lbs from knees to chest without c/o pain to  increase safety with lifting for ADL's  05/01/17 Requires verbal and tactile cues to avoid compensations and pain MG   Sessions:  22      Goal 4:  Patient will score >=4/5 on resisted pivot prone R UE to facilitate painfree use of R UE during dressing and bathing activities.                                [name], PT

## 2017-07-15 ENCOUNTER — Inpatient Hospital Stay: Payer: BLUE CROSS/BLUE SHIELD | Attending: Orthopaedic Surgery | Admitting: Physical Therapist

## 2017-07-15 DIAGNOSIS — M5412 Radiculopathy, cervical region: Secondary | ICD-10-CM | POA: Insufficient documentation

## 2017-07-15 NOTE — PT/OT Therapy Note (Signed)
Name: Kent Jordan Age: 66 y.o.   Referring Physician: Harriet Masson, MD   Date of Injury: 03/24/2017  Date Care Plan Established/Reviewed: 04/23/2017  Date Treatment Started: 04/23/2017  Visit Count: 16   Diagnosis:   1. Cervical radiculitis        Subjective     Pain   My knee is bothering me today. The back has been so much better, but there is this one spot where the muscles are just so tight.  Usually I have pain on the L side of my back but this is on the right.    Objective:  R SLR: positive at 50 with sacral and L LB symptoms.    L SLR 70  Palpation:  ST restriction R QL , lumbar paraspinals [id number]  Pelvic mobility:  Efficient endrange AE/PD R    Social Support/Occupation  Lives in: multiple level home  Lives with: spouse  Occupation: retired                Building surveyor   Ab series: position 1 30 sec holds x 2 reps  Seated abd sereies:  30 sec holds x 2 reps  LTR: verbal cues for segmental control    Neuromuscular Re-Education   R pelvic PD end range holds, progressed and added R LE ext/ abd     Manual Therapy   STMobs to R hamstrings   R LE neural flossing   Skin/ superficial fascia  BC R iliac crest   Long axis distraction R LE with hip hike FM       ---      ---   Total Time   Timed Minutes  43 minutes   Total Time  43 minutes        Assessment   Patient with decreased sacral L LB symptoms with R SLR at end of session today, recheck iliac crest height next visit.  Continue to progress core strength and NM control above fusion to reduce recidivism of symptoms and to decrease risk for falls.    Plan   Continue per POC- segmental control bilaterally  FOTO          Goals    Goal 1:  Patient will improve score on FOTO to demonstrate improved function.    Sessions:  22      Goal 2:  Improve bilateral cervical rotation to >=60 degrees to facilitate safety while driving  09/29/08 Progressing with thoracic girdle mobility to improve AROM. MG   Sessions:  22   Progression:  progressing        Goal 3:  Pt will demo efficient axial elongation, hip hinge with neutral trunk and T-rex arms to keep load close to lift 10 lbs from knees to chest without c/o pain to increase safety with lifting for ADL's  05/01/17 Requires verbal and tactile cues to avoid compensations and pain MG   Sessions:  22      Goal 4:  Patient will score >=4/5 on resisted pivot prone R UE to facilitate painfree use of R UE during dressing and bathing activities.                                [name], PT

## 2017-07-17 ENCOUNTER — Encounter (INDEPENDENT_AMBULATORY_CARE_PROVIDER_SITE_OTHER): Payer: Self-pay | Admitting: Internal Medicine

## 2017-07-17 ENCOUNTER — Ambulatory Visit (INDEPENDENT_AMBULATORY_CARE_PROVIDER_SITE_OTHER): Payer: BLUE CROSS/BLUE SHIELD | Admitting: Internal Medicine

## 2017-07-17 VITALS — BP 170/82 | HR 73 | Temp 97.9°F | Resp 16 | Wt 198.8 lb

## 2017-07-17 DIAGNOSIS — R221 Localized swelling, mass and lump, neck: Secondary | ICD-10-CM

## 2017-07-17 DIAGNOSIS — J029 Acute pharyngitis, unspecified: Secondary | ICD-10-CM

## 2017-07-17 LAB — POCT RAPID STREP A: Rapid Strep A Screen POCT: NEGATIVE

## 2017-07-17 MED ORDER — IBUPROFEN 600 MG PO TABS
600.0000 mg | ORAL_TABLET | Freq: Three times a day (TID) | ORAL | 0 refills | Status: DC | PRN
Start: 2017-07-17 — End: 2019-08-03

## 2017-07-17 MED ORDER — AMOXICILLIN-POT CLAVULANATE 875-125 MG PO TABS
1.0000 | ORAL_TABLET | Freq: Two times a day (BID) | ORAL | 0 refills | Status: AC
Start: 2017-07-17 — End: 2017-07-27

## 2017-07-17 NOTE — Progress Notes (Signed)
Have you seen any specialists/other providers since your last visit with us?    No    Arm preference verified?   Yes    The patient is due for spirometry and [facility] letter

## 2017-07-17 NOTE — Progress Notes (Signed)
Subjective:       Patient ID: Kent Jordan is a 66 y.o. male.    HPI    C/o swelling of the neck since 3-4 days.  Associated with tenderness.   Denies any redness . No fever/ chills.   States swelling has increased in size.   Reports associated sore throat- difficulty in swallowing which has been improving .   No ear or dental pain. No gum swelling or abscess.     The following portions of the patient's history were reviewed and updated as appropriate: He  has a past medical history of Arrhythmia; Arthritis; Back pain; Blood disorder (11/2014); Diabetes mellitus (2016); Difficulty in walking(719.7); Elevated liver function tests; Failed back syndrome; Fatty liver; Hemochromatosis; High cholesterol; Hypertensive disorder (06/2016); Low back pain; Lung nodule (2012); Neuropathy; Psoriasis; Psoriatic arthritis; S/P insertion of spinal cord stimulator (2015); and Sleep apnea.  He  does not have any pertinent problems on file.  He  has a past surgical history that includes Lumbar fusion (10/2011); Microdiscectomy lumbar (06/2011, 07/2011); Knee arthrocentesis (1983); Abdominal surgery (03/1971); CATARACT EXT.WITH IOL (Bilateral, 2009); Fracture surgery (1993); INSERTION, SPINAL CORD STIMULATOR GENERATOR (N/A, 05/07/2014); Septoplasty (10/05/2015); Skin biopsy (11/2015); ARTHROSCOPY, KNEE (Right, 03/21/2016); and ARTHROPLASTY, KNEE, TOTAL (Right, 08/20/2016).  His family history includes Heart disease in his father and mother.  He  reports that he quit smoking about 5 years ago. He has a 41.00 pack-year smoking history. He has never used smokeless tobacco. He reports that he drinks about 1.2 oz of alcohol per week . He reports that he does not use drugs.  He has a current medication list which includes the following prescription(s): amlodipine, aspirin, duloxetine, enbrel sureclick, ezetimibe, folic acid, freestyle lite, hydrochlorothiazide, freestyle, metformin, rosuvastatin, amoxicillin-clavulanate, clobetasol,  cyclobenzaprine, ibuprofen, and trazodone.  He is allergic to angiotensin receptor blockers; ciprofloxacin; ace inhibitors; and gabapentin..    Review of Systems   Constitutional: Negative for chills, fatigue and fever.   HENT: Positive for facial swelling, sore throat and trouble swallowing. Negative for congestion, postnasal drip, rhinorrhea, sinus pain and voice change.    Respiratory: Negative for shortness of breath.    Cardiovascular: Negative for chest pain.   Gastrointestinal: Negative for nausea and vomiting.   Musculoskeletal: Negative for neck pain and neck stiffness.   Skin: Negative for color change and rash.   Neurological: Negative for dizziness, light-headedness and headaches.   Hematological: Positive for adenopathy.           Objective:    Physical Exam   Constitutional: He appears well-developed and well-nourished.   HENT:   Right Ear: Tympanic membrane normal.   Left Ear: Tympanic membrane normal.   Neck:   3 x3 cm  Soft to firm swelling on the submandibular area- Left > right. TTP. No erythema. Not warmth. Not fluctuant.    Cardiovascular: Normal rate, regular rhythm and normal heart sounds.    Pulmonary/Chest: Effort normal and breath sounds normal. No respiratory distress. He has no wheezes.   Nursing note and vitals reviewed.  Blood pressure 170/82, pulse 73, temperature 97.9 F (36.6 C), temperature source Oral, resp. rate 16, weight 90.2 kg (198 lb 12.8 oz).        Assessment:       1. Localized swelling, mass and lump, neck  amoxicillin-clavulanate (AUGMENTIN) 875-125 MG per tablet    ibuprofen (ADVIL,MOTRIN) 600 MG tablet    [place] Head Neck Soft Tissue   2. Sore throat  POCT rapid strep A  Plan:       1. Localized swelling, mass and lump, neck  Localized   Infection  vs lymphadenopathy.   - amoxicillin-clavulanate (AUGMENTIN) 875-125 MG per tablet; Take 1 tablet by mouth 2 (two) times daily.for 10 days  Dispense: 20 tablet; Refill: 0  - ibuprofen (ADVIL,MOTRIN) 600 MG tablet; Take  1 tablet (600 mg total) by mouth every 8 (eight) hours as needed for Pain.  Dispense: 30 tablet; Refill: 0  - [place] Head Neck Soft Tissue; Future    2. Sore throat  - POCT rapid strep A- negative today     ER precautions reviewed with the patient.   - RTC if symptoms does not improve or worsens.     Risk & Benefits of the new medication(s) were explained to the pt (and family) who appeared to understand & agree to the treatment plan.      Procedures

## 2017-07-18 ENCOUNTER — Inpatient Hospital Stay: Payer: BLUE CROSS/BLUE SHIELD | Admitting: Physical Therapist

## 2017-07-21 ENCOUNTER — Encounter (INDEPENDENT_AMBULATORY_CARE_PROVIDER_SITE_OTHER): Payer: Self-pay | Admitting: Internal Medicine

## 2017-07-22 ENCOUNTER — Inpatient Hospital Stay: Payer: BLUE CROSS/BLUE SHIELD | Admitting: Physical Therapist

## 2017-07-23 ENCOUNTER — Ambulatory Visit
Admission: RE | Admit: 2017-07-23 | Discharge: 2017-07-23 | Disposition: A | Discharge status: Home / Self Care | Payer: BLUE CROSS/BLUE SHIELD | Source: Ambulatory Visit | Attending: Internal Medicine | Admitting: Internal Medicine

## 2017-07-23 DIAGNOSIS — R221 Localized swelling, mass and lump, neck: Secondary | ICD-10-CM | POA: Insufficient documentation

## 2017-07-24 ENCOUNTER — Ambulatory Visit
Admission: RE | Admit: 2017-07-24 | Discharge: 2017-07-24 | Disposition: A | Discharge status: Home / Self Care | Payer: BLUE CROSS/BLUE SHIELD | Source: Ambulatory Visit | Attending: Otolaryngology | Admitting: Otolaryngology

## 2017-07-24 DIAGNOSIS — B2790 Infectious mononucleosis, unspecified without complication: Secondary | ICD-10-CM | POA: Insufficient documentation

## 2017-07-24 LAB — CBC AND DIFFERENTIAL
Absolute NRBC: 0 10*3/uL
Basophils Absolute Automated: 0.09 10*3/uL (ref 0.00–0.20)
Basophils Automated: 1.3 %
Eosinophils Absolute Automated: 0.4 10*3/uL (ref 0.00–0.70)
Eosinophils Automated: 5.6 %
Hematocrit: 47.1 % (ref 42.0–52.0)
Hgb: 16 g/dL (ref 13.0–17.0)
Immature Granulocytes Absolute: 0.04 10*3/uL
Immature Granulocytes: 0.6 %
Lymphocytes Absolute Automated: 2.65 10*3/uL (ref 0.50–4.40)
Lymphocytes Automated: 37.2 %
MCH: 31.5 pg (ref 28.0–32.0)
MCHC: 34 g/dL (ref 32.0–36.0)
MCV: 92.7 fL (ref 80.0–100.0)
MPV: 9.3 fL — ABNORMAL LOW (ref 9.4–12.3)
Monocytes Absolute Automated: 0.89 10*3/uL (ref 0.00–1.20)
Monocytes: 12.5 %
Neutrophils Absolute: 3.06 10*3/uL (ref 1.80–8.10)
Neutrophils: 42.8 %
Nucleated RBC: 0 /100 WBC (ref 0.0–1.0)
Platelets: 247 10*3/uL (ref 140–400)
RBC: 5.08 10*6/uL (ref 4.70–6.00)
RDW: 12 % (ref 12–15)
WBC: 7.13 10*3/uL (ref 3.50–10.80)

## 2017-07-24 LAB — MONONUCLEOSIS SCREEN: Mono Screen: NEGATIVE

## 2017-07-25 LAB — EPSTEIN-BARR VIRUS VCA, IGM: EBV VCA Ab, IgM: 0.05 (ref <=0.90)

## 2017-07-25 LAB — EPSTEIN-BARR VIRUS VCA, IGG: EBV VCA Ab, IgG: 5.06 — ABNORMAL HIGH (ref <=0.90)

## 2017-07-26 ENCOUNTER — Inpatient Hospital Stay: Payer: BLUE CROSS/BLUE SHIELD | Attending: Orthopaedic Surgery | Admitting: Physical Therapist

## 2017-07-26 DIAGNOSIS — M5412 Radiculopathy, cervical region: Secondary | ICD-10-CM | POA: Insufficient documentation

## 2017-07-26 NOTE — Addendum Note (Signed)
Addended by: [name] on: 07/26/2017 12:12 PM     Modules accepted: Orders

## 2017-07-26 NOTE — PT/OT Therapy Note (Signed)
Name: Kent Jordan Age: 66 y.o.   Referring Physician: Harriet Masson, MD   Date of Injury: 03/24/2017  Date Care Plan Established/Reviewed: 06/24/2017  Date Treatment Started: 04/23/2017  Visit Count: 17   Diagnosis:   1. Cervical radiculitis        Subjective     Pain   I have had a rough few weeks.  I've been sick and now I am taking steroids, not taking embrel my lymph nodes are swollen, they are trying to figure out what is going on.  They might do a biopsy to rule out lymphoma if the steroids don't work.    Objective:  Gait assessment: abducted gait pattern, lacks pelvic mobility    Social Support/Occupation  Lives in: multiple level home  Lives with: spouse  Occupation: retired                Warehouse manager   Nerve root flossing R LE in supine    Manual Therapy   Distraction SC joint with R LE ER/IR FM in prone   R to L coccyx shear grade 4 with R LE IR/ER FM  BC bilateral lumbar and thoracic spinal grooves  STMobs to L and R thoracic and lumbar paraspinals, QL to improve gait mechanics/ bilateral inominate mobility to improve transfers   Nerve root flossing R LE in supine         Assessment   Focused on decreasing bilateral lumbar paraspinal and QL spasms to improve gait mechanics bilaterally .  Patient with improved gait pattern but continues with abducted gait pattern- continue to improve bilateral pelvic AE/PD to address bilaterally to decrease risk for falls  Plan   Continue per POC      Goals    Goal 1:  Patient will improve score on FOTO to demonstrate improved function.    Sessions:  18   Progression:  progressing      Goal 2:  Improve bilateral cervical rotation to >=60 degrees to facilitate safety while driving  12/29/94 Progressing with thoracic girdle mobility to improve AROM. MG   Sessions:  18   Progression:  progressing       Goal 3:  Pt will demo efficient axial elongation, hip hinge with neutral trunk and T-rex arms to keep load close to lift 10 lbs from knees to chest  without c/o pain to increase safety with lifting for ADL's  05/01/17 Requires verbal and tactile cues to avoid compensations and pain MG   Sessions:  18      Goal 4:  Patient will score >=4/5 on resisted pivot prone R UE to facilitate painfree use of R UE during dressing and bathing activities.   Sessions:  18          Goal 5:  Pt will report ability to amb with symmetrical gait pattern without AD x 30 mins at a time to ease ADL performance daily   Sessions:  15   Progression:  new      Goal 6:  Patient will improve lumbopelvic mobility above and below fusions to facilitate bed mobilities with >=3/10 pain   Sessions:  15   Progression:  new      Goal 7:  Patient will demonstrate LPM >4/5 in all diagonals to facilitate unrestricted standing activities during ADL >=1 hours ad lib throughout day   Sessions:  18   Progression:  new  Delora Fuel, PT

## 2017-07-26 NOTE — Progress Notes (Addendum)
Name: Kent Jordan Age: 66 y.o.   Referring Physician: Harriet Masson, MD   Date of Injury: 03/24/2017  Date Care Plan Established/Reviewed: 06/24/2017  Date Treatment Started: 04/23/2017  Visit Count: 12   Diagnosis:   1. Cervical radiculitis    2. Anterior knee pain, right    3. Acute bilateral low back pain with sciatica, sciatica laterality unspecified        Subjective     History of Present Illness   Functional Limitations (PLOF): Painful bed mobilities (previously painfree)  Painful transfers (previously independent and painfree)  Painful ambulation with reports of unsteadiness (previously able to ambulate ad lib through community >2 hours)     Social Support/Occupation  Lives in: multiple level home  Lives with: spouse  Occupation: retired                      ---      ---   Total Time   Timed Minutes  41 minutes   Total Time  41 minutes        Assessment   Kent Jordan is a 66 y.o. male presenting with cervical radiculitis, low back pain  who requires Physical Therapy for the following:  Impairments:   Pain that limits and interferes with functional ability.   Impaired postural alignment.  Decreased range of motion of the cervical spine  Decreased strength of the scapular musculature and deep neck flexor  Decreased deep neck flexor strength and stability.   Decreased joint mobility of the cervical spine  Decreased soft tissue mobility of cervical spine  Pain that limits and interferes with functional ability.   Impaired postural alignment.  Decreased range of motion of the lumbar spine and hip  Decreased strength of the core and hip  Decreased functional stability of the core  Decreased joint mobility of the lumbar spine  Decreased soft tissue mobility of lumbar spine  Decreased/impaired motor control during hip hinging       Pain located: cervical spine, bilateral UE's/ LE's, lumbar spine/ thoracic spine, ribcage  Clinical presentation: unstable 2 to recent fall  Barriers to therapy: Past surgical history history  of lumbar fusion, stimulator placement for pain  Mechanism of injury/illness/exacerbation : recent fall down steps  Prior Level of Function: Painful bed mobilities (previously painfree)  Painful transfers (previously independent and painfree)  Painful ambulation with reports of unsteadiness (previously able to ambulate ad lib through community >2 hours)   Prognosis: excellent  Plan   Visits per week: 2  Number of Sessions: 18  Direct One on One  91478: Therapeutic Exercise: To Develop Strength and Endurance, ROM and Flexibility  [id number]: Gait Training  29562: Neuromuscular Reeducation  [phone]: Self Care/Home Mgmt Training (ADLs, safety procedures, use of assistive devices)  97140: Manual Therapy techniques (mobilization, manipulation, manual traction) (Bilateral UE's, LE's, cervical/ thoracic/ lumbar spine, ribcage, thoracic and pelvic girdles)  97530: Therapeutic Activities: Dynamic activities to improve functional performance  Dry Needling    Goals    Goal 1:  Patient will improve score on FOTO to demonstrate improved function.    Sessions:  18   Progression:  progressing      Goal 2:  Improve bilateral cervical rotation to >=60 degrees to facilitate safety while driving  01/29/83 Progressing with thoracic girdle mobility to improve AROM. MG   Sessions:  18   Progression:  progressing       Goal 3:  Pt will demo efficient axial elongation, hip hinge with  neutral trunk and T-rex arms to keep load close to lift 10 lbs from knees to chest without c/o pain to increase safety with lifting for ADL's  05/01/17 Requires verbal and tactile cues to avoid compensations and pain MG   Sessions:  18      Goal 4:  Patient will score >=4/5 on resisted pivot prone R UE to facilitate painfree use of R UE during dressing and bathing activities.   Sessions:  18          Goal 5:  Pt will report ability to amb with symmetrical gait pattern without AD x 30 mins at a time to ease ADL performance daily   Sessions:  15   Progression:  new       Goal 6:  Patient will improve lumbopelvic mobility above and below fusions to facilitate bed mobilities with >=3/10 pain   Sessions:  15   Progression:  new      Goal 7:  Patient will demonstrate LPM >4/5 in all diagonals to facilitate unrestricted standing activities during ADL >=1 hours ad lib throughout day   Sessions:  18   Progression:  new                   [name], PT

## 2017-07-29 ENCOUNTER — Inpatient Hospital Stay: Payer: BLUE CROSS/BLUE SHIELD | Admitting: Physical Therapist

## 2017-07-30 ENCOUNTER — Inpatient Hospital Stay: Payer: BLUE CROSS/BLUE SHIELD | Attending: Orthopaedic Surgery | Admitting: Physical Therapist

## 2017-07-30 DIAGNOSIS — M5412 Radiculopathy, cervical region: Secondary | ICD-10-CM

## 2017-07-30 NOTE — PT/OT Therapy Note (Signed)
Name: Kent Jordan Age: 66 y.o.   Referring Physician: Harriet Masson, MD   Date of Injury: 03/24/2017  Date Care Plan Established/Reviewed: 06/24/2017  Date Treatment Started: 04/23/2017  Visit Count: 18   Diagnosis:   1. Cervical radiculitis        Subjective     Pain   I'm done with the steroids tomorrow.  Yesterday I was taking easy golf swings, today I feel L hip pain in back and it wrapped around the leg towards the front.      Social Support/Occupation  Lives in: multiple level home  Lives with: spouse  Occupation: retired                Surveyor, [employer]   Sitting:  STMobs to thoracic and lumbar paraspinals with forward bending   Prone:  BC L and R SI joint, iliac crest   STMobs to L lumbar paraspinals  L SI gapping mobs grade 4   L SI approximation and anterior shear mob grade 4   L inominate flexion mob grade 4   Visceral mobs with LTR to L and R   SL inominate IR mobs bilaterally grade 4 with basking seal FM       ---      ---   Total Time   Timed Minutes  43 minutes   Total Time  43 minutes        Assessment   Patient with exacerbation to bilateral SI joint and low back pain.  Addressed bilateral SI joint/ inominate dysfunction to facilitate more efficient gait pattern.  Next visit: Extensive NMR to improve pelvic NM control bilaterally, core initiation/ strengthening  Plan   Continue per POC      Goals    Goal 1:  Patient will improve score on FOTO to demonstrate improved function.    Sessions:  18   Progression:  progressing      Goal 2:  Improve bilateral cervical rotation to >=60 degrees to facilitate safety while driving  0/9/81 Progressing with thoracic girdle mobility to improve AROM. MG   Sessions:  18   Progression:  progressing       Goal 3:  Pt will demo efficient axial elongation, hip hinge with neutral trunk and T-rex arms to keep load close to lift 10 lbs from knees to chest without c/o pain to increase safety with lifting for ADL's  05/01/17 Requires verbal and tactile cues to  avoid compensations and pain MG   Sessions:  18      Goal 4:  Patient will score >=4/5 on resisted pivot prone R UE to facilitate painfree use of R UE during dressing and bathing activities.   Sessions:  18          Goal 5:  Pt will report ability to amb with symmetrical gait pattern without AD x 30 mins at a time to ease ADL performance daily  07/26/17 MG.  Progressing decreasing muscle spasm to improve gait mechanics.       Sessions:  15   Progression:  progressing      Goal 6:  Patient will improve lumbopelvic mobility above and below fusions to facilitate bed mobilities with >=3/10 pain   Sessions:  15   Progression:  new      Goal 7:  Patient will demonstrate LPM >4/5 in all diagonals to facilitate unrestricted standing activities during ADL >=1 hours ad lib throughout day   Sessions:  18  Progression:  new                   [name], PT

## 2017-07-31 ENCOUNTER — Inpatient Hospital Stay: Payer: BLUE CROSS/BLUE SHIELD | Attending: Orthopaedic Surgery | Admitting: Physical Therapist

## 2017-07-31 DIAGNOSIS — M5412 Radiculopathy, cervical region: Secondary | ICD-10-CM | POA: Insufficient documentation

## 2017-07-31 NOTE — PT/OT Therapy Note (Deleted)
Name: BOLTON CANUPP Age: 66 y.o.   Referring Physician: Harriet Masson, MD   Date of Injury: 03/24/2017  Date Care Plan Established/Reviewed: 06/24/2017  Date Treatment Started: 04/23/2017  Visit Count: 19   Diagnosis:   1. Cervical radiculitis                                  Goals    Goal 1:  Patient will improve score on FOTO to demonstrate improved function.    Sessions:  18   Progression:  progressing      Goal 2:  Improve bilateral cervical rotation to >=60 degrees to facilitate safety while driving  09/29/08 Progressing with thoracic girdle mobility to improve AROM. MG   Sessions:  18   Progression:  progressing       Goal 3:  Pt will demo efficient axial elongation, hip hinge with neutral trunk and T-rex arms to keep load close to lift 10 lbs from knees to chest without c/o pain to increase safety with lifting for ADL's  05/01/17 Requires verbal and tactile cues to avoid compensations and pain MG   Sessions:  18      Goal 4:  Patient will score >=4/5 on resisted pivot prone R UE to facilitate painfree use of R UE during dressing and bathing activities.   Sessions:  18          Goal 5:  Pt will report ability to amb with symmetrical gait pattern without AD x 30 mins at a time to ease ADL performance daily  07/26/17 MG.  Progressing decreasing muscle spasm to improve gait mechanics.       Sessions:  15   Progression:  progressing      Goal 6:  Patient will improve lumbopelvic mobility above and below fusions to facilitate bed mobilities with >=3/10 pain   Sessions:  15   Progression:  new      Goal 7:  Patient will demonstrate LPM >4/5 in all diagonals to facilitate unrestricted standing activities during ADL >=1 hours ad lib throughout day   Sessions:  18   Progression:  new                   [name], PT

## 2017-07-31 NOTE — PT/OT Therapy Note (Signed)
Name: Kent Jordan Age: 66 y.o.   Referring Physician: Harriet Masson, MD   Date of Injury: 03/24/2017  Date Care Plan Established/Reviewed: 06/24/2017  Date Treatment Started: 04/23/2017  Visit Count: 19   Diagnosis:   1. Cervical radiculitis        Subjective     Pain   The pain is still in the L and R (SI joints) and a little bit lower.  I was able to sleep well last night, and I was able to walk for about 30 minutes this am so I am feeling better.   Objective:  Lacking bilateral pelvic AE/PD during gait, minnimal arm swing bilaterally        Social Support/Occupation  Lives in: multiple level home  Lives with: spouse  Occupation: retired                Building surveyor   Prone:  Resisted R then L hip IR/ER x 10 reps each direction  Supine:  Piriformis stretch 30 sec x 2 reps bilaterally       Neuromuscular Re-Education   Justification: To facilitate core and improve gait mechanics  End range holds R then L after hip on axis mobs  R then L pelvic AE/PD RI  R then L pelvic AE end range holds, COI  R then L pelvic PD end range holds, COI  Mass elongation R then L     Manual Therapy   R then L hip on axis mobs grade 4  BC bilateral sacral ILA  STMobs to bilateral piriformis         ---      ---   Total Time   Timed Minutes  43 minutes   Total Time  43 minutes        Assessment   Patient able to perform painfree bed mobilities at end of session today, improved bilateral pelvic AE/PD while walking with increased arm swing bilaterally.    Plan   Continue per POC      Goals    Goal 1:  Patient will improve score on FOTO to demonstrate improved function.    Sessions:  18   Progression:  progressing      Goal 2:  Improve bilateral cervical rotation to >=60 degrees to facilitate safety while driving  09/29/08 Progressing with thoracic girdle mobility to improve AROM. MG   Sessions:  18   Progression:  progressing       Goal 3:  Pt will demo efficient axial elongation, hip hinge with neutral trunk and T-rex  arms to keep load close to lift 10 lbs from knees to chest without c/o pain to increase safety with lifting for ADL's  05/01/17 Requires verbal and tactile cues to avoid compensations and pain MG   Sessions:  18      Goal 4:  Patient will score >=4/5 on resisted pivot prone R UE to facilitate painfree use of R UE during dressing and bathing activities.   Sessions:  18          Goal 5:  Pt will report ability to amb with symmetrical gait pattern without AD x 30 mins at a time to ease ADL performance daily  07/26/17 MG.  Progressing decreasing muscle spasm to improve gait mechanics.       Sessions:  15   Progression:  progressing      Goal 6:  Patient will improve lumbopelvic mobility above and below fusions  to facilitate bed mobilities with >=3/10 pain   Sessions:  15   Progression:  new      Goal 7:  Patient will demonstrate LPM >4/5 in all diagonals to facilitate unrestricted standing activities during ADL >=1 hours ad lib throughout day   Sessions:  18   Progression:  new                   [name], PT

## 2017-08-05 ENCOUNTER — Inpatient Hospital Stay: Payer: BLUE CROSS/BLUE SHIELD | Admitting: Physical Therapist

## 2017-08-07 ENCOUNTER — Inpatient Hospital Stay: Payer: BLUE CROSS/BLUE SHIELD | Admitting: Physical Therapist

## 2017-08-21 ENCOUNTER — Ambulatory Visit (INDEPENDENT_AMBULATORY_CARE_PROVIDER_SITE_OTHER): Payer: BLUE CROSS/BLUE SHIELD | Admitting: Orthopaedic Surgery

## 2017-08-23 ENCOUNTER — Other Ambulatory Visit (INDEPENDENT_AMBULATORY_CARE_PROVIDER_SITE_OTHER): Payer: Self-pay | Admitting: Student in an Organized Health Care Education/Training Program

## 2017-08-23 DIAGNOSIS — E119 Type 2 diabetes mellitus without complications: Secondary | ICD-10-CM

## 2017-08-23 MED ORDER — METFORMIN HCL 500 MG PO TABS
500.0000 mg | ORAL_TABLET | Freq: Every morning | ORAL | 1 refills | Status: DC
Start: 2017-08-23 — End: 2018-03-12

## 2017-08-23 NOTE — Telephone Encounter (Signed)
Name, strength, directions of requested refill(s):  metFORMIN (GLUCOPHAGE) 500 MG tablet    Pharmacy to send refill to or patient to pick up rx from office (mark requested pharmacy in BOLD):      [id number] - [place], [place] - [address]  [address]  [place] [address]  Phone: [phone] Fax: [phone]      Please mark "X" next to the preferred call back number:    Mobile:   Telephone Information:   Mobile [phone]    X   Home: [phone]    Work:           Next visit: 09/11/2017

## 2017-08-26 ENCOUNTER — Encounter (INDEPENDENT_AMBULATORY_CARE_PROVIDER_SITE_OTHER): Payer: Self-pay | Admitting: Orthopaedic Surgery

## 2017-08-26 ENCOUNTER — Encounter (INDEPENDENT_AMBULATORY_CARE_PROVIDER_SITE_OTHER): Payer: Self-pay

## 2017-08-26 ENCOUNTER — Ambulatory Visit (INDEPENDENT_AMBULATORY_CARE_PROVIDER_SITE_OTHER): Payer: BLUE CROSS/BLUE SHIELD

## 2017-08-26 ENCOUNTER — Ambulatory Visit (INDEPENDENT_AMBULATORY_CARE_PROVIDER_SITE_OTHER): Payer: BLUE CROSS/BLUE SHIELD | Admitting: Orthopaedic Surgery

## 2017-08-26 VITALS — BP 174/93 | HR 83 | Resp 17 | Ht 71.0 in | Wt 195.0 lb

## 2017-08-26 DIAGNOSIS — Z96651 Presence of right artificial knee joint: Secondary | ICD-10-CM

## 2017-08-26 DIAGNOSIS — Z471 Aftercare following joint replacement surgery: Secondary | ICD-10-CM

## 2017-08-26 NOTE — Progress Notes (Signed)
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  He reports that the knee is doing very well.  He is very pleased with results was knee replacement surgery.  He has no complaints.  Since I saw him last, however, he did have an accidental trip and fall down the stairs in which he broke several ribs, which occurred about 3 months ago, but he does feel that he is improved from that as well.    PE:   Ambulating independently with a non-antalgic gait.   Incision well-healed, without erythema, drainage, or collections  ROM: 0 degrees of extension to 125 degrees of flexion  Calves soft/NT, negative Homan's test and Thompson's sign    A/P: Approximately One-year s/p right total knee replacement, doing very well  He may continue his activities ad lib.  I did recommend lifetime antibiotic prophylaxis prior to dental visits.    I will see him back for followup in 2-3 years, with new AP, Lateral, and Sunrise Xrays of the knee at that time, or sooner if he has any problems in the meantime.

## 2017-09-05 ENCOUNTER — Other Ambulatory Visit (INDEPENDENT_AMBULATORY_CARE_PROVIDER_SITE_OTHER): Payer: Self-pay | Admitting: Student in an Organized Health Care Education/Training Program

## 2017-09-05 DIAGNOSIS — E782 Mixed hyperlipidemia: Secondary | ICD-10-CM

## 2017-09-11 ENCOUNTER — Ambulatory Visit (INDEPENDENT_AMBULATORY_CARE_PROVIDER_SITE_OTHER): Payer: BLUE CROSS/BLUE SHIELD | Admitting: Student in an Organized Health Care Education/Training Program

## 2017-09-11 ENCOUNTER — Encounter (INDEPENDENT_AMBULATORY_CARE_PROVIDER_SITE_OTHER): Payer: Self-pay | Admitting: Student in an Organized Health Care Education/Training Program

## 2017-09-11 VITALS — BP 140/78 | HR 75 | Temp 98.0°F | Resp 16 | Wt 201.0 lb

## 2017-09-11 DIAGNOSIS — I10 Essential (primary) hypertension: Secondary | ICD-10-CM

## 2017-09-11 DIAGNOSIS — E782 Mixed hyperlipidemia: Secondary | ICD-10-CM

## 2017-09-11 DIAGNOSIS — E663 Overweight: Secondary | ICD-10-CM

## 2017-09-11 DIAGNOSIS — E119 Type 2 diabetes mellitus without complications: Secondary | ICD-10-CM

## 2017-09-11 NOTE — Progress Notes (Signed)
Patient ID: Kent Jordan  is a 66 y.o.  male.     Chief Complaint   Patient presents with   . Essential hypertension      HPI     Problem   Overweight (Bmi 25.0-29.9)    Counseled on diet and exercise  Joined a gym and will increase activities    Body mass index is 28.03 kg/m.  Wt Readings from Last 3 Encounters:   09/11/17 91.2 kg (201 lb)   08/26/17 88.5 kg (195 lb)   07/17/17 90.2 kg (198 lb 12.8 oz)         Type 2 Diabetes Mellitus Without Complication, Without Long-Term Current Use of Insulin      Medication(s): Metformin 500mg  daily  Side effects: none  Hypoglycemic episodes: none  Neuropathic Sx: no, follows with podiatry  Eye exam in the past year: April 2018, no retinopathy, follows with Dr. Jon Billings  PNA vaccine:~2016  Last urine microalbumin: normal June 2018    Review of labs showed:  Lab Results   Component Value Date    HGBA1C 5.1 03/12/2017    HGBA1C 6.3 (H) 06/29/2016    HGBA1C 7.1 (H) 05/30/2016          Mixed Hyperlipidemia    Medication(s): Zetia 10mg , Crestor 5mg   Side effects: Crestor causes rash at higher doses  37yr CVD risk:  DM2, HTN, former smoker; statin benefit      Denies CP/dyspnea/dizziness/HA/orthopnea/edema   Counseled on diet and exercise    Review of lipid panel shows:  Lab Results   Component Value Date    CHOL 171 03/12/2017    CHOL 149 05/30/2016    CHOL 131 10/06/2015     Lab Results   Component Value Date    TRIG 139 03/12/2017    TRIG 115 05/30/2016    TRIG 103 10/06/2015      Lab Results   Component Value Date    HDL 72 03/12/2017    HDL 38 (L) 05/30/2016    HDL 38 (L) 10/06/2015     Lab Results   Component Value Date    LDL 71 03/12/2017    LDL 88 05/30/2016    LDL 72 10/06/2015          Essential Hypertension    Goal BP < 140/90  Borderline BP in clinic today, controlled at home  Home BP: 120s/80s  Medications: amlodipine 10mg , HCTZ 25mg    Side Effects: no (avoid ARBs and ACEi d/t hyperkalemia and angioedema)  Diet/exercise: walking regularly  Smoking: quit 5 years ago,  35 pack year history  Symptoms: denies CP/SOB/dizziness/HA  Follows with cardiology    BP Readings from Last 3 Encounters:   09/11/17 140/78   08/26/17 (!) 174/93   07/17/17 170/82               The following portions of the patient's history were reviewed and updated as appropriate: current medications, allergies, past family history, past medical history, past social history, past surgical history and problem list.     Review of Systems   Constitutional: Negative for appetite change, chills, fatigue, fever and unexpected weight change.   HENT: Negative for congestion, rhinorrhea and sore throat.    Eyes: Negative for visual disturbance.   Respiratory: Negative for cough and shortness of breath.    Cardiovascular: Negative for chest pain and palpitations.   Gastrointestinal: Negative for abdominal pain and blood in stool.   Endocrine: Negative for cold intolerance and heat intolerance.  Genitourinary: Negative for difficulty urinating.   Musculoskeletal: Negative for arthralgias.   Neurological: Negative for dizziness, weakness, numbness and headaches.   Hematological: Negative for adenopathy.   Psychiatric/Behavioral: Negative for dysphoric mood. The patient is not nervous/anxious.    All other systems reviewed and are negative.         OBJECTIVE     BP 140/78 (BP Site: Left arm, Patient Position: Sitting)   Pulse 75   Temp 98 F (36.7 C) (Oral)   Resp 16   Wt 91.2 kg (201 lb)   BMI 28.03 kg/m     Physical Exam    GENERAL : alert and in no distress   SKIN: warm and dry, w/o rash, normal turgor   Head: ncat  Neck: supple, no mass/thyromegaly/LAD, FROM  Eyes: PERRLA, EOMI  Ears: external canals clear, TMs clear, hearing intact to finger rub  Nose: patent, no turbinate swelling/discharge  Throat: normal oropharynx, no exudates/swelling  LUNGS: clear, no w/r/r  HEART: RRR normal S1 and S2  without gallop , murmur or rub  ABDOMEN  BS(+)  soft, non- tender , non- distended,  without organomegaly   PSYCH: Normal  mood and affect, linear thought process, appropriate interactions, no SI/hallucinations      ASSESSMENT      [name] is a 66 y.o. male with   1. Essential hypertension  CBC without differential    Comprehensive metabolic panel   2. Type 2 diabetes mellitus without complication, without long-term current use of insulin  Hemoglobin A1C   3. Mixed hyperlipidemia  Lipid panel   4. Overweight (BMI 25.0-29.9)            PLAN     1. Essential hypertension  - At goal BP < 140/90 based on home BP, continue current regimen  - Encourage DASH diet and regular exercise, keep home BP log, call if elevated    - CBC without differential; Future  - Comprehensive metabolic panel; Future    2. Type 2 diabetes mellitus without complication, without long-term current use of insulin  - Goal A1c < 7, good control, encourage diabetic diet and regular exercise, continue current regimen, consider stopping metformin if A1c stays in nondiabetic range   - Hemoglobin A1C; Future    3. Mixed hyperlipidemia  - Goal LDL < 100, good control, statin benefit, cont statin+Zetia, no sfx, encourage diet and exercise   - Lipid panel; Future    4. Overweight (BMI 25.0-29.9)  - Counseled on diet and exercise, target weight loss of 12 lbs in the next 3 months     Call if symptoms persist, worsen, or change.  Call with updates/questions/concerns.    F/u 6 months    Medication list reviewed with patient and updated as indicated.  Risk & Benefits of any new medication(s) were explained to the patient who verbalized understanding & agreed to the treatment plan.  Patient given a printed copy of AVS. Patient verbalized understanding of instructions given.

## 2017-09-11 NOTE — Progress Notes (Signed)
Have you seen any specialists/other providers since your last visit with [place]?    Yes, ENT in [place]    Arm preference verified?   Yes    The patient is due for [facility], Spirometry

## 2017-10-14 NOTE — PT/OT Therapy Note (Signed)
Discontinuation of Therapy Services    Roy Tokarz Conley did not complete prescribed physical therapy visits.      Status is unknown at this time, physical therapy has been discontinued and patient has been discharged from care.  The last therapy note is below for review.    Please feel free to contact me with any questions regarding the care of [facility].    Sincerely,    Roosvelt Harps, [place] [id number]         10/14/2017

## 2017-11-11 ENCOUNTER — Encounter (INDEPENDENT_AMBULATORY_CARE_PROVIDER_SITE_OTHER): Payer: Self-pay

## 2017-12-03 ENCOUNTER — Encounter (INDEPENDENT_AMBULATORY_CARE_PROVIDER_SITE_OTHER): Payer: Self-pay

## 2017-12-25 ENCOUNTER — Inpatient Hospital Stay: Payer: BLUE CROSS/BLUE SHIELD

## 2017-12-25 DIAGNOSIS — M6281 Muscle weakness (generalized): Secondary | ICD-10-CM

## 2017-12-25 DIAGNOSIS — M542 Cervicalgia: Secondary | ICD-10-CM | POA: Insufficient documentation

## 2017-12-25 DIAGNOSIS — R293 Abnormal posture: Secondary | ICD-10-CM

## 2017-12-25 NOTE — PT/OT Therapy Note (Deleted)
Name: Kent Jordan Age: 67 y.o.   Referring Physician:     Date of Injury: No data was found  Date Care Plan Established/Reviewed: No data was found  Date Treatment Started: No data was found  Visit Count: 1   Diagnosis: No diagnosis found.    Subjective     History of Present Illness   History of Present Illness: "I was experiencing some tingling in my hands a while back and I thought it was just carpal tunnel. I got an EMG in March of this year. 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 I'm still experiencing a lot of that tingling at night and I feel like someone is standing at the top of my head. I have pain on both sides of my neck that radiates down into both of my shoulders. I notice that it's hard to look up for any extended period of time, and I have terrible pain afterwards if I have to do something that requires looking up for a while. I have limited ROM when I have to look side to side while driving. It's really hard for me to sleep throughout the night because of this pain. Nothing seems help with the discomfort and I haven't been able to take many medications due to my diabetes and high blood pressure. I also notice that I have a lot of difficulty maintaining a position looking down when I chop vegetable while cooking dinner and I cook a lot. 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     Palpation B/L UT, L levator, B/L cervical paraspinal.     Neurological Testing     Sensation   Cervical/Thoracic   Left   Diminished: light touch    Comments   Left light touch: Diminished at C8 and T1.              Treatment     Therapeutic Exercises   Pivot prone                Lawernce Ion, PT, DPT

## 2017-12-27 NOTE — Progress Notes (Signed)
Name: Kent Jordan Age: 67 y.o.   Referring Physician: Jerrol Banana, MD   Date of Injury: 12/04/2017  Date Care Plan Established/Reviewed: 12/25/2017  Date Treatment Started: 12/25/2017  Visit Count: 1   Diagnosis:   1. Cervicalgia    2. Muscle weakness    3. Abnormal posture        Subjective     History of Present Illness   History of Present Illness: "I was experiencing some tingling in my hands a while back and I thought it was just carpal tunnel. I got an EMG in March of this year. 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 I'm still experiencing a lot of that tingling at night and I feel like someone is standing at the top of my head. I have pain on both sides of my neck that radiates down into both of my shoulders. I notice that it's hard to look up for any extended period of time, and I have terrible pain afterwards if I have to do something that requires looking up for a while. I have limited ROM when I have to look side to side while driving. It's really hard for me to sleep throughout the night because of this pain. Nothing seems help with the discomfort and I haven't been able to take many medications due to my diabetes and high blood pressure. I also notice that I have a lot of difficulty maintaining a position looking down when I chop vegetable while cooking dinner and I cook a lot. I've never had a hx with neck pain in the past, but I have a long hx of low back pain."  Functional Limitations (PLOF): Difficulty with sleeping due to increased cervical pain (No issues previously)  Difficulty with cooking due to increased cervical pain (No issues previously)   Difficulty with driving due to lack of cervical ROM and increased pain (No issues previously)    Outcome Measure   Tool Used/Details: FOTO    Social Support/Occupation  Lives in: multiple level home  Lives with: spouse  Occupation: retired    [employer]     AROM: Cervical/Thoracic Spine       Cervical Flexion 50 pain    Cervical Extension 23 pain   Thoracic Flexion     Thoracic Extension     (blank fields were intentionally left blank)         Left AROM Cervical/Thoracic    Right AROM   31  Cervical Lateral Flexion 27    40 pain Cervical Rotation 52 pain     Thoracic Lateral Flexion       Thoracic Rotation     (blank fields were intentionally left blank)  Extension was the most painful cervical ROM.     Strength        Left Strength  Shoulder      Right     Neck Lateral Flexion     4- pain  Shoulder Flexion 4- pain     Shoulder Extension     4- pain  Shoulder Abduction 4- pain   4+  Shoulder IR 4+    4+  Shoulder ER 4+      Serratus       Rhomboids       Upper Trapezius       Middle Trapezius       Lower Trapezius       Biceps  Triceps     (blank fields were intentionally left blank)        Palpation B/L UT, levator, and cervical paraspinals moderately TTP    Neurological Testing     Sensation   Cervical/Thoracic   Left   Diminished: light touch    Right   Intact: light touch             Treatment     Therapeutic Exercises   Pt educated on anatomical structures that are relevant to his current issues and his physical therapy POC moving forward.     Pivot prone in sitting with verbal/tactile cueing to complete the exercise with proper form.          ---      ---   Total Time   Timed Minutes  15 minutes   Untimed Minutes  25 minutes   Total Time  40 minutes        Assessment   Kent Jordan is a 67 y.o. male presenting with Neck Pain who requires Physical Therapy for the following:  Impairments:   Pain that limits and interferes with functional ability.   Impaired postural alignment.  Decreased range of motion of the cervical spine  Decreased strength of the scapular musculature and deep neck flexor  Decreased deep neck flexor strength and stability.   Decreased joint mobility of the cervical spine  Decreased soft tissue mobility of cervical spine    Pain located: Cervical Spine    Clinical presentation: evolving due to worsening sx's  since initial onset  Barriers to therapy: None at this time.   Prior Level of Function: Difficulty with sleeping due to increased cervical pain (No issues previously)  Difficulty with cooking due to increased cervical pain (No issues previously)   Difficulty with driving due to lack of cervical ROM and increased pain (No issues previously)  Prognosis: excellent  Plan   Visits per week: 2  Number of Sessions: 16  [employer] One on One  16109: Therapeutic Exercise: To Develop Strength and Endurance, ROM and Flexibility  [phone]: Neuromuscular Reeducation (Proprioceptive Neuromuscular Faciliation)  [id number]: Self Care/Home Mgmt Training (ADLs, safety procedures, use of assistive devices)  97140: Manual Therapy techniques (mobilization, manipulation, manual traction) (Grade I-V to cervical spine, thoracic spine, shoulder girdle, and regionally interdependent joints)  97530: Therapeutic Activities: Dynamic activities to improve functional performance  Dry Needling  Supervised Modalities  97010: Thermal modalities: hot/cold packs  91478: Mechnical traction  97014: Electrical stimulation    Goals    Goal 1:  Pt will be proficient with HEP particiaption to help improve ADL completion and decrease functional limitations. Pt's current HEP:    Pivot Prone   Sessions:  16   Progression:  new      Goal 2:  Pt will increase B/L cervical rotation to at least 60 degrees without increased pain to allow for safer driving and improved ADL completion.    Sessions:  16   Progression:  new       Goal 3:  Pt will be able to look down and cook for at least 20 min without increased cervical pain.    Sessions:  16   Progression:  new      Goal 4:  Pt will increase B/L UE strength to at least 5-5 throughout to be able to complete necessary ADL's without increased cervical pain.    Sessions:  16   Progression:  new  Lyndee Hensen, PT, DPT

## 2017-12-31 ENCOUNTER — Inpatient Hospital Stay: Payer: BLUE CROSS/BLUE SHIELD | Attending: Orthopaedic Surgery

## 2017-12-31 DIAGNOSIS — M542 Cervicalgia: Secondary | ICD-10-CM | POA: Insufficient documentation

## 2017-12-31 DIAGNOSIS — M6281 Muscle weakness (generalized): Secondary | ICD-10-CM

## 2017-12-31 DIAGNOSIS — R293 Abnormal posture: Secondary | ICD-10-CM

## 2017-12-31 NOTE — PT/OT Therapy Note (Signed)
Name: Kent Jordan Age: 66 y.o.   Referring Physician: Harriet Masson, MD   Date of Injury: 12/04/2017  Date Care Plan Established/Reviewed: 12/25/2017  Date Treatment Started: 12/25/2017  Visit Count: 2   Diagnosis:   1. Cervicalgia    2. Muscle weakness    3. Abnormal posture        Subjective     Social Support/Occupation  Lives in: multiple level home  Lives with: spouse  Occupation: retired    "I was at the dentist office for an hour today and the position I was in caused me some cervical tension. It felt like someone was standing on my neck when I got out of the chair. The exercise have been going well, but it's been really hard to relax into the pivot prone exercise."    Objective   Midline lower cervical pain with cervical extension.     B/L cervical tension/pain with B/L cervical rotation (L>R)             Treatment     Therapeutic Exercises   Pt educated on anatomical structures that are relevant to his current issues.    Review of current HEP with verbal cueing to help improve form with pivot prone exercise.     Neuromuscular Re-Education   Supine axial elongation with a pillow under the head for support due to pt not being able to tolerate a flat surface. Pt required moderate verbal/tactile cueing to complete the exercise properly.     Manual Therapy   STM to B/L UT's, levators, cervical paraspinals, suboccipitals, and scalenes.       ---      ---   Total Time   Timed Minutes  43 minutes   Total Time  43 minutes        Assessment   Decreased myofascial restrictions around the neck post MT. Pt stated that he felt looser and had less pain with cervical AROM in all directions post tx. Exercises that help to improve cervical stabilization will be beneficial moving forward.   Plan   Assess t-spine next session. Segmental multifidi activation as tolerated.       Goals    Goal 1:  Pt will be proficient with HEP particiaption to help improve ADL completion and decrease functional limitations. Pt's current  HEP:    Pivot Prone   Sessions:  16   Progression:  new      Goal 2:  Pt will increase B/L cervical rotation to at least 60 degrees without increased pain to allow for safer driving and improved ADL completion.    Sessions:  16   Progression:  new       Goal 3:  Pt will be able to look down and cook for at least 20 min without increased cervical pain.    Sessions:  16   Progression:  new      Goal 4:  Pt will increase B/L UE strength to at least 5-5 throughout to be able to complete necessary ADL's without increased cervical pain.    Sessions:  16   Progression:  new                                Lawernce Ion, PT, DPT

## 2018-01-02 ENCOUNTER — Inpatient Hospital Stay: Payer: BLUE CROSS/BLUE SHIELD | Attending: Orthopaedic Surgery

## 2018-01-02 DIAGNOSIS — M6281 Muscle weakness (generalized): Secondary | ICD-10-CM

## 2018-01-02 DIAGNOSIS — R293 Abnormal posture: Secondary | ICD-10-CM

## 2018-01-02 DIAGNOSIS — M542 Cervicalgia: Secondary | ICD-10-CM

## 2018-01-02 NOTE — PT/OT Therapy Note (Signed)
Name: Kent Jordan Age: 67 y.o.   Referring Physician: Harriet Masson, MD   Date of Injury: 12/04/2017  Date Care Plan Established/Reviewed: 12/25/2017  Date Treatment Started: 12/25/2017  Visit Count: 3   Diagnosis:   1. Cervicalgia    2. Muscle weakness    3. Abnormal posture        Subjective     Social Support/Occupation  Lives in: multiple level home  Lives with: spouse  Occupation: retired    "I'm not too bad because it's the morning, but it still hurts the most towards the end of the day. I maintained that feeling of being more mobile in the neck since last session, so that's good. I'm feeling looser than I have in a long time. It really doesn't feel as bad as it did before."    Objective   L 1st rib appears to be slightly elevated.              Treatment     Therapeutic Exercises   Review of current HEP with verbal cueing to help improve form.     Pt educated on anatomical structures that are relevant to his current issues.    Resisted scap retractions (red) 2x10 with verbal/tactile cueing to complete the exercise properly.     T-spine self mob with a towel roll and coordinated breathing in hooklying x15 each. Pt required verbal cueing to maintain proper breathing pattern with the exercise.     Manual Therapy   STM to the L scalenes, UT, and levator.    Inferior mobilizations of the R 2-5th ribs with assistance of pt breathing.     Grade III-IV inferior mobilization of the L 1st rib in R side lying.        ---      ---   Total Time   Timed Minutes  40 minutes   Total Time  40 minutes        Assessment   Pt stated that he has decreased pain in the cervical spine with improved mobility post tx. He stated that the pain felt less intense with L cervical rotation and extension post tx. He was able to complete all exercises fully and ended the session without residual pain in the cervical spine. Improved L 1st rib position post tx.   Plan   Continue per POC. Continue with postural musculature strengthening.        Goals    Goal 1:  Pt will be proficient with HEP particiaption to help improve ADL completion and decrease functional limitations. Pt's current HEP:    Pivot Prone  Chin tucks   Sessions:  16   Progression:  new      Goal 2:  Pt will increase B/L cervical rotation to at least 60 degrees without increased pain to allow for safer driving and improved ADL completion.    Sessions:  16   Progression:  new       Goal 3:  Pt will be able to look down and cook for at least 20 min without increased cervical pain.    Sessions:  16   Progression:  new      Goal 4:  Pt will increase B/L UE strength to at least 5-5 throughout to be able to complete necessary ADL's without increased cervical pain.    Sessions:  16   Progression:  new  Lyndee Hensen, PT, DPT

## 2018-01-07 ENCOUNTER — Inpatient Hospital Stay: Payer: BLUE CROSS/BLUE SHIELD

## 2018-01-10 ENCOUNTER — Inpatient Hospital Stay: Payer: BLUE CROSS/BLUE SHIELD | Attending: Orthopaedic Surgery

## 2018-01-10 DIAGNOSIS — M542 Cervicalgia: Secondary | ICD-10-CM

## 2018-01-10 DIAGNOSIS — M6281 Muscle weakness (generalized): Secondary | ICD-10-CM

## 2018-01-10 DIAGNOSIS — R293 Abnormal posture: Secondary | ICD-10-CM

## 2018-01-10 NOTE — PT/OT Therapy Note (Signed)
Name: Kent Jordan Age: 67 y.o.   Referring Physician: Harriet Masson, MD   Date of Injury: 12/04/2017  Date Care Plan Established/Reviewed: 12/25/2017  Date Treatment Started: 12/25/2017  Visit Count: 4   Diagnosis:   1. Cervicalgia    2. Muscle weakness    3. Abnormal posture        Subjective     Social Support/Occupation  Lives in: multiple level home  Lives with: spouse  Occupation: retired    I had a bad week. I hit my head on the roof of the car running over a concrete [place]. I got no sleep last night and my back is killing me too.   R>L parastheasias in forearms and hand worse since bumping the head, I feel it when my arm is in this position in S/L (demonstartes shoulder flex/protraction/add)    Objective   cerv rot  R 75% L 65% pre, R 80% L 75% post    cerv flexion - decreased pain    Shoulder abd NTT R 30 deg pre, 45 deg post             Treatment     Therapeutic Exercises   Seated cerv L rot with self-mob upper Tspine SP rotation to R    Pt edu re: anatomy and treatment rationale, discussed high inflammatory status and benefits of changing diet to less inflammatory foods      Manual Therapy   Supine  BC manubrium, clavicle  A/p mobs grade 4 manubrium, med to lat mobs ribs 1,2 off manubrium with LTR and breathing, followed by resisted breathing with resistance at manubrium   STM UT, deep cerv fascia, scalenes, post serratus with small AROM cerv rot  S/L inf glide ribs 2,3 with breathing  Supine inf glide 1st rib with cover position and breathing  STM pec major minor, clavicle distraction   seated R to L rot T1-3       ---      ---   Total Time   Timed Minutes  40 minutes   Total Time  40 minutes        Assessment   Pt with improved cerv rot and shoulder abd at end of session but VERY TTP with all MT and discussed benefits of anti inflammatory diet to decrease overall pain and inflammation.   Plan   Follow up on radic symptoms R UE      Goals    Goal 1:  Pt will be proficient with HEP particiaption to help  improve ADL completion and decrease functional limitations. Pt's current HEP:    Pivot Prone  Chin tucks   Sessions:  16   Progression:  new      Goal 2:  Pt will increase B/L cervical rotation to at least 60 degrees without increased pain to allow for safer driving and improved ADL completion.    Sessions:  16   Progression:  new       Goal 3:  Pt will be able to look down and cook for at least 20 min without increased cervical pain.    Sessions:  16   Progression:  new      Goal 4:  Pt will increase B/L UE strength to at least 5-5 throughout to be able to complete necessary ADL's without increased cervical pain.    Sessions:  16   Progression:  new  Burman Freestone, PT

## 2018-01-13 ENCOUNTER — Inpatient Hospital Stay: Payer: BLUE CROSS/BLUE SHIELD

## 2018-01-13 DIAGNOSIS — M6281 Muscle weakness (generalized): Secondary | ICD-10-CM

## 2018-01-13 DIAGNOSIS — M542 Cervicalgia: Secondary | ICD-10-CM | POA: Insufficient documentation

## 2018-01-13 DIAGNOSIS — R293 Abnormal posture: Secondary | ICD-10-CM

## 2018-01-13 NOTE — PT/OT Therapy Note (Signed)
Name: Kent Jordan Age: 67 y.o.   Referring Physician: Jerrol Banana, MD   Date of Injury: 12/04/2017  Date Care Plan Established/Reviewed: 12/25/2017  Date Treatment Started: 12/25/2017  Visit Count: 5   Diagnosis:   1. Cervicalgia    2. Muscle weakness    3. Abnormal posture        Subjective     Social Support/Occupation  Lives in: multiple level home  Lives with: spouse  Occupation: retired    "My neck has been feeling better since I hit it on the roof of the car. The exercise [employer] gave me last session has been helping. The neck has just been hurting me, but nothing to terrible."     Objective   Pain at the base of the skull with cervical extension.     Hypomobile lateral glides throughout the cervical spine due to increased soft tension restrictions and tone.              Treatment     Therapeutic Exercises   Pt educated on anatomical structures that are relevant to his current issues.    Review of current HEP with verbal cueing to help improve form with supine chin tucks.     Neuromuscular Re-Education   Segmental cervical multifidi activation with active chin tucks.    Seated chin tucks added to pt's HEP. Verbal cueing to help complete the exercise with proper form.     Manual Therapy   Suboccipital release     STM to B/L suboccipitals, R levator, and R cervical paraspinals.        ---      ---   Total Time   Timed Minutes  43 minutes   Total Time  43 minutes        Assessment   Pt was able to complete all exercises with only mild discomfort on the R side of the neck. Pt was able to complete seated chin tucks with better form and less compensation than the supine chin tucks. Significantly improve cervical extension ROM and decreased pain in the neck with ext post tx.  Pt stated that he felt good at the end of the session.  Plan   Continue per POC. Reassess lateral glides of the cervical spine.       Goals    Goal 1:  Pt will be proficient with HEP particiaption to help improve ADL completion and decrease  functional limitations. Pt's current HEP:    Pivot Prone  Chin tucks   Sessions:  16   Progression:  new      Goal 2:  Pt will increase B/L cervical rotation to at least 60 degrees without increased pain to allow for safer driving and improved ADL completion.    Sessions:  16   Progression:  new       Goal 3:  Pt will be able to look down and cook for at least 20 min without increased cervical pain.    Sessions:  16   Progression:  new      Goal 4:  Pt will increase B/L UE strength to at least 5-5 throughout to be able to complete necessary ADL's without increased cervical pain.    Sessions:  16   Progression:  new          Goal 5:  Pt will be proficient with HEP particiaption to help improve ADL completion and decrease functional limitations. Pt's current HEP:    Seated chin tucks  Pivot prone    Sessions:  16   Progression:  new                         Lawernce Ion, PT, DPT

## 2018-01-15 ENCOUNTER — Inpatient Hospital Stay: Payer: BLUE CROSS/BLUE SHIELD

## 2018-01-15 DIAGNOSIS — M6281 Muscle weakness (generalized): Secondary | ICD-10-CM

## 2018-01-15 DIAGNOSIS — R293 Abnormal posture: Secondary | ICD-10-CM

## 2018-01-15 DIAGNOSIS — M542 Cervicalgia: Secondary | ICD-10-CM

## 2018-01-15 NOTE — PT/OT Therapy Note (Signed)
Name: Kent Jordan Age: 67 y.o.   Referring Physician: Jerrol Banana, MD   Date of Injury: 12/04/2017  Date Care Plan Established/Reviewed: 12/25/2017  Date Treatment Started: 12/25/2017  Visit Count: 6   Diagnosis:   1. Cervicalgia    2. Muscle weakness    3. Abnormal posture        Subjective     Social Support/Occupation  Lives in: multiple level home  Lives with: spouse  Occupation: retired    "I ws having a lot pain at this muscle here yesterday (points to R levator). I worked it out and massaged it more. I'm feeling better today. I've been more active over the past few days. I was clearing some things out and I carried over 100lbs worth of paper yesterday. I'm actually feeling pretty good today."                 Treatment     Therapeutic Exercises   Pt educated in anatomical structures that are relevant to their current issues.    Review of current HEP with verbal cueing to help improve form.     Neuromuscular Re-Education   Prolonged holds at end range B/L cervical rotation.    Segmental cervical multifidi activation with active chin tucks.    Manual Therapy   MET to help improve cervical rotation.     STM to the B/L levators, scalenes, UT's, and R suboccipitals.     Pt educated on self STM and trigger point release to B/L UT's and levators with thera cane.        ---      ---   Total Time   Timed Minutes  43 minutes   Total Time  43 minutes        Assessment   Pt stated that he felt good and had improved cervical ROM at the end of the session. Noticeable improvement with R cervical rotation AROM post MET. Pt would benefit from continued cervical stabilization exercises as tolerated.   Plan   Prone cervical stabilization exercises as tolerated.       Goals    Goal 1:  Pt will be proficient with HEP particiaption to help improve ADL completion and decrease functional limitations. Pt's current HEP:    Pivot Prone  Chin tucks   Sessions:  16   Progression:  new      Goal 2:  Pt will increase B/L cervical  rotation to at least 60 degrees without increased pain to allow for safer driving and improved ADL completion.    Sessions:  16   Progression:  new       Goal 3:  Pt will be able to look down and cook for at least 20 min without increased cervical pain.    Sessions:  16   Progression:  new      Goal 4:  Pt will increase B/L UE strength to at least 5-5 throughout to be able to complete necessary ADL's without increased cervical pain.    Sessions:  16   Progression:  new          Goal 5:  Pt will be proficient with HEP particiaption to help improve ADL completion and decrease functional limitations. Pt's current HEP:    Seated chin tucks  Pivot prone    Sessions:  16   Progression:  new  Lyndee Hensen, PT, DPT

## 2018-01-21 ENCOUNTER — Inpatient Hospital Stay: Payer: BLUE CROSS/BLUE SHIELD

## 2018-01-21 DIAGNOSIS — M6281 Muscle weakness (generalized): Secondary | ICD-10-CM

## 2018-01-21 DIAGNOSIS — R293 Abnormal posture: Secondary | ICD-10-CM

## 2018-01-21 DIAGNOSIS — M542 Cervicalgia: Secondary | ICD-10-CM | POA: Insufficient documentation

## 2018-01-21 NOTE — PT/OT Therapy Note (Signed)
Name: Kent Jordan Age: 67 y.o.   Referring Physician: Jerrol Banana, MD   Date of Injury: 12/04/2017  Date Care Plan Established/Reviewed: 12/25/2017  Date Treatment Started: 12/25/2017  Visit Count: 7   Diagnosis:   1. Cervicalgia    2. Muscle weakness    3. Abnormal posture        Subjective     Social Support/Occupation  Lives in: multiple level home  Lives with: spouse  Occupation: retired    "I woke up with some tightness at this part of my neck (points to levator). I worked it out a bit and now [employer] not having any issues with it. I'm feeling good today."                Treatment     Therapeutic Exercises   Pt educated on anatomical structures that are relevant to his current issues.    Review of current HEP with verbal cueing to help improve form.     W-row (green) x15. Verbal cueing to complete the exercise properly.     Back on wall and supine with towel roll and B/L shoulder flexion t-spine mobilization x10 each. Verbal cueing to stay under threshold of pain during the exercise.       Manual Therapy   STM to the R levator, rhomboids, scalenes, and UT.        ---      ---   Total Time   Timed Minutes  40 minutes   Total Time  40 minutes        Assessment   Pt had B/L shoulder pain and had difficulty relaxing B/L UT-spine with t-spine mobilization. It was initially attempted in supine, but caused too much pain. Pt was able to complete it against the wall with less difficulty. Pt was fatigued and had difficulty relaxing B/L UT's with resisted w-row, so he was advised to complete the exercise at home without resistance. Continued exercises that help improve periscap strength and t-spine mobility will be beneficial moving forward.   Plan   Continue per POC. Assess t-spine segments.       Goals    Goal 1:  Pt will be proficient with HEP particiaption to help improve ADL completion and decrease functional limitations. Pt's current HEP:    Pivot Prone  Chin tucks   Sessions:  16   Progression:  new      Goal 2:   Pt will increase B/L cervical rotation to at least 60 degrees without increased pain to allow for safer driving and improved ADL completion.    Sessions:  16   Progression:  new       Goal 3:  Pt will be able to look down and cook for at least 20 min without increased cervical pain.    Sessions:  16   Progression:  new      Goal 4:  Pt will increase B/L UE strength to at least 5-5 throughout to be able to complete necessary ADL's without increased cervical pain.    Sessions:  16   Progression:  new          Goal 5:  Pt will be proficient with HEP particiaption to help improve ADL completion and decrease functional limitations. Pt's current HEP:    Seated chin tucks  Pivot prone    Sessions:  16   Progression:  new  Lyndee Hensen, PT, DPT

## 2018-01-22 ENCOUNTER — Inpatient Hospital Stay: Payer: BLUE CROSS/BLUE SHIELD

## 2018-01-22 DIAGNOSIS — R293 Abnormal posture: Secondary | ICD-10-CM

## 2018-01-22 DIAGNOSIS — M542 Cervicalgia: Secondary | ICD-10-CM | POA: Insufficient documentation

## 2018-01-22 DIAGNOSIS — M6281 Muscle weakness (generalized): Secondary | ICD-10-CM

## 2018-01-22 NOTE — PT/OT Therapy Note (Signed)
Name: Kent Jordan Age: 67 y.o.   Referring Physician: Jerrol Banana, MD   Date of Injury: 12/04/2017  Date Care Plan Established/Reviewed: 12/25/2017  Date Treatment Started: 12/25/2017  Visit Count: 8   Diagnosis:   1. Cervicalgia    2. Muscle weakness    3. Abnormal posture        Subjective     Social Support/Occupation  Lives in: multiple level home  Lives with: spouse  Occupation: retired    "I'm feeling okay overall. I've been trying to sleep in a better position. I'm still having some tingling in the hands when I'm sleeping. It usually happens when I'm on my side and the hand that's on my opposite side starts to tingle when it's resting on me. They ruled out carpal tunnel a while ago."    Objective   R cervical rotation 67 degrees  L cervical rotation 63 degrees    (+) L median nerve tension test at 10 degrees              Treatment     Therapeutic Exercises   Pt educated on anatomical structures that are relevant to his current issues.    Review of current HEP with verbal cueing to help improve form.     Median nerve glides in supine bilaterally x10 each. Verbal cueing to slow the movement down and stay under threshold of pain during the exercise.     Neuromuscular Re-Education   Segmental cervicothoracic extension. Manual contact at T5-C7. Verbal cueing throughout the exercise to help maintain proper form.     Manual Therapy   Median nerve tracing throughout B/L UE's.        ---      ---   Total Time   Timed Minutes  43 minutes   Total Time  43 minutes        Assessment   Negative median nerve test on the L UE post tx. Pt stated that he felt good at the end of the session. Pt able to get to 35 degrees of cervical extension post NMR. He stated that he had less restriction with cervical extension post tx. Continued exercises that improve periscap strength will be beneficial moving forward.   Plan   Continue cervicothoracic extension exercise as tolerated.       Goals    Goal 1:  Pt will be proficient with  HEP particiaption to help improve ADL completion and decrease functional limitations. Pt's current HEP:    Pivot Prone  Chin tucks   Sessions:  16   Progression:  new      Goal 2:  Pt will increase B/L cervical rotation to at least 60 degrees without increased pain to allow for safer driving and improved ADL completion.    Sessions:  16   Progression:  new       Goal 3:  Pt will be able to look down and cook for at least 20 min without increased cervical pain.    Sessions:  16   Progression:  new      Goal 4:  Pt will increase B/L UE strength to at least 5-5 throughout to be able to complete necessary ADL's without increased cervical pain.    Sessions:  16   Progression:  new          Goal 5:  Pt will be proficient with HEP particiaption to help improve ADL completion and decrease functional limitations. Pt's current HEP:  Seated chin tucks  Pivot prone    Sessions:  16   Progression:  new                         Lawernce Ion, PT, DPT

## 2018-02-10 ENCOUNTER — Inpatient Hospital Stay: Payer: BLUE CROSS/BLUE SHIELD

## 2018-02-18 ENCOUNTER — Inpatient Hospital Stay: Payer: BLUE CROSS/BLUE SHIELD

## 2018-02-28 ENCOUNTER — Other Ambulatory Visit (FREE_STANDING_LABORATORY_FACILITY): Payer: BLUE CROSS/BLUE SHIELD

## 2018-02-28 DIAGNOSIS — I10 Essential (primary) hypertension: Secondary | ICD-10-CM

## 2018-02-28 DIAGNOSIS — E782 Mixed hyperlipidemia: Secondary | ICD-10-CM

## 2018-02-28 DIAGNOSIS — E119 Type 2 diabetes mellitus without complications: Secondary | ICD-10-CM

## 2018-02-28 LAB — COMPREHENSIVE METABOLIC PANEL
ALT: 110 U/L — ABNORMAL HIGH (ref 0–55)
AST (SGOT): 63 U/L — ABNORMAL HIGH (ref 5–34)
Albumin/Globulin Ratio: 1.6 (ref 0.9–2.2)
Albumin: 4.4 g/dL (ref 3.5–5.0)
Alkaline Phosphatase: 84 U/L (ref 38–106)
BUN: 16 mg/dL (ref 9.0–28.0)
Bilirubin, Total: 0.8 mg/dL (ref 0.2–1.2)
CO2: 22 mEq/L (ref 21–29)
Calcium: 9.6 mg/dL (ref 8.5–10.5)
Chloride: 101 mEq/L (ref 100–111)
Creatinine: 1 mg/dL (ref 0.5–1.5)
Globulin: 2.7 g/dL (ref 2.0–3.7)
Glucose: 128 mg/dL — ABNORMAL HIGH (ref 70–100)
Potassium: 4.1 mEq/L (ref 3.5–5.1)
Protein, Total: 7.1 g/dL (ref 6.0–8.3)
Sodium: 137 mEq/L (ref 136–145)

## 2018-02-28 LAB — CBC
Absolute NRBC: 0 10*3/uL (ref 0.00–0.00)
Hematocrit: 44.2 % (ref 37.6–49.6)
Hgb: 15.2 g/dL (ref 12.5–17.1)
MCH: 32.8 pg (ref 25.1–33.5)
MCHC: 34.4 g/dL (ref 31.5–35.8)
MCV: 95.3 fL (ref 78.0–96.0)
MPV: 10 fL (ref 8.9–12.5)
Nucleated RBC: 0 /100 WBC (ref 0.0–0.0)
Platelets: 244 10*3/uL (ref 142–346)
RBC: 4.64 10*6/uL (ref 4.20–5.90)
RDW: 13 % (ref 11–15)
WBC: 5.81 10*3/uL (ref 3.10–9.50)

## 2018-02-28 LAB — HEMOLYSIS INDEX: Hemolysis Index: 10 (ref 0–18)

## 2018-02-28 LAB — LIPID PANEL
Cholesterol / HDL Ratio: 2.8
Cholesterol: 153 mg/dL (ref 0–199)
HDL: 55 mg/dL (ref 40–9999)
LDL Calculated: 79 mg/dL (ref 0–99)
Triglycerides: 95 mg/dL (ref 34–149)
VLDL Calculated: 19 mg/dL (ref 10–40)

## 2018-02-28 LAB — HEMOGLOBIN A1C
Average Estimated Glucose: 102.5 mg/dL
Hemoglobin A1C: 5.2 % (ref 4.6–5.9)

## 2018-02-28 LAB — GFR: EGFR: >60

## 2018-03-02 ENCOUNTER — Other Ambulatory Visit (INDEPENDENT_AMBULATORY_CARE_PROVIDER_SITE_OTHER): Payer: Self-pay | Admitting: Student in an Organized Health Care Education/Training Program

## 2018-03-02 DIAGNOSIS — E782 Mixed hyperlipidemia: Secondary | ICD-10-CM

## 2018-03-04 ENCOUNTER — Encounter (INDEPENDENT_AMBULATORY_CARE_PROVIDER_SITE_OTHER): Payer: Self-pay | Admitting: Student in an Organized Health Care Education/Training Program

## 2018-03-04 DIAGNOSIS — R7989 Other specified abnormal findings of blood chemistry: Secondary | ICD-10-CM | POA: Insufficient documentation

## 2018-03-06 ENCOUNTER — Encounter (INDEPENDENT_AMBULATORY_CARE_PROVIDER_SITE_OTHER): Payer: Self-pay

## 2018-03-10 ENCOUNTER — Inpatient Hospital Stay: Payer: BLUE CROSS/BLUE SHIELD

## 2018-03-10 DIAGNOSIS — M25521 Pain in right elbow: Secondary | ICD-10-CM | POA: Insufficient documentation

## 2018-03-10 DIAGNOSIS — G8929 Other chronic pain: Secondary | ICD-10-CM | POA: Insufficient documentation

## 2018-03-10 NOTE — Progress Notes (Signed)
Name: Kent Jordan Age: 67 y.o.   Referring Physician: Jerrol Banana, MD   Date of Injury: 02/19/2018  Date Care Plan Established/Reviewed: 03/10/2018  Date Treatment Started: 03/10/2018  Visit Count: 1   Diagnosis:   1. Chronic elbow pain, right        Subjective     History of Present Illness   History of Present Illness: R elbow pain. Started a few months ago. I was replacing a light in April and stripping the wires and hit my R pointer finger into the brick wall. My knuckle started to hurt and then my elbow started to hurt. Diagnosed with lateral epicondylitis. This does not seem related to my neck pain at all. Painful and unable to fully straighten R elbow.   I was given a strap which has helped some  Functional Limitations (PLOF): straightening R elbow, lifting or grabbing such as groceries, chopping vegetables / gripping on to knife, gripping steering wheel   At night gets numbness tingling in hand, helps to straighten out the elbow    Pain   Inside and outside of elbow, rarely gets shooting pain into forearm    Social Support/Occupation  Lives in: multiple level home  Lives with: spouse  Occupation: retired    [employer]   Right Shoulder AROM WFL.       Left AROM    Left PROM   Elbow/Wrist    Right AROM    Right PROM   138    Elbow Flexion 135      +11    Elbow Extension 0 pain     88    Forearm Supination 85 pain      WFL   Forearm Pronation  WFL     66    Wrist Flexion 80       64    Wrist Extension 60          Radial Deviation           Ulnar Deviation       (blank fields were intentionally left blank)        Strength        Left Strength  Elbow      Right   5  Elbow Flexion 4    4+   Elbow Extension 4+      Forearm Supination        Forearm Pronation     5  Wrist Flexion 4+    4+  Wrist Extension 4+      Radial Deviation       Ulnar Deviation     (blank fields were intentionally left blank)    Left Hand     Grip (2nd hand position)     Trial 1: 70    Trial 2: 68    Trial 3: 64     Average: 67.33    Right Hand     Grip (2nd hand position)     Trial 1: 50    Trial 2: 50    Trial 3: 50    Average: 50    Palpation TTP lateral>medial epicondyle and wrist extensors>flexors             Treatment     Therapeutic Exercises   Pt edu re: exam findings with pertinent anatomy with help of model/pictures, role of PT, POC    HEP: eccentric wrist extension with 1 lb DB x 20 reps  Neuromuscular Re-Education   Supine  End range shoulder flex/abd/ER wrist extension PH, then COI to improve NM and motor control for end range elbow extension     Manual Therapy   BC radius and ulna  Ulna distraction in std  Seating in fossa with sup/pron  Radial head on axis  SMT wrist extensors       ---      ---   Total Time   Timed Minutes  30 minutes   Untimed Minutes  10 minutes   Total Time  40 minutes        Assessment   Kent Jordan is a 67 y.o. male presenting with R elbow pain who requires Physical Therapy for the following:  Impairments:   Pain that limits and interferes with functional ability.   Impaired postural alignment.  Decreased range of motion of the elbow  Decreased strength of the R UE and grips strength  Decreased deep neck flexor strength and stability.   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  Prior Level of Function: straightening R elbow, lifting or grabbing such as groceries, chopping vegetables / gripping on to knife, gripping steering wheel   At night gets numbness tingling in hand, helps to straighten out the elbow  Prognosis: excellent  Plan   Visits per week: 2  Number of Sessions: 12  Direct One on One  16109: Therapeutic Exercise: To Develop Strength and Endurance, ROM and Flexibility  [phone]: Neuromuscular Reeducation (Proprioceptive Neuromuscular Faciliation)  [phone]: Self Care/Home Mgmt Training (ADLs, safety  procedures, use of assistive devices)  97140: Manual Therapy techniques (mobilization, manipulation, manual traction) (Grade I-V to cervical spine, thoracic spine, shoulder girdle, and regionally interdependent joints)  97530: Therapeutic Activities: Dynamic activities to improve functional performance  Dry Needling  Supervised Modalities  97010: Thermal modalities: hot/cold packs  91478: Mechnical traction  97014: Electrical stimulation    Goals    Goal 1:  R elbow extension equal to L for painfree reaching    Met at end of session today  03/10/2018 MM   Sessions:  6      Goal 2:  Pt will report ability to sleep waking less than 2 times nightly to reposition using positioning techniques to increase restful sleep and ease OOB transition in AM   Sessions:  12      Goal 3:  Grip strength R UE equal to L for painfree lifting of groceries    Sessions:  [address], PT

## 2018-03-12 ENCOUNTER — Encounter (INDEPENDENT_AMBULATORY_CARE_PROVIDER_SITE_OTHER): Payer: Self-pay | Admitting: Student in an Organized Health Care Education/Training Program

## 2018-03-12 ENCOUNTER — Ambulatory Visit (INDEPENDENT_AMBULATORY_CARE_PROVIDER_SITE_OTHER): Payer: BLUE CROSS/BLUE SHIELD | Admitting: Student in an Organized Health Care Education/Training Program

## 2018-03-12 VITALS — BP 140/76 | HR 73 | Temp 97.9°F | Resp 16 | Wt 209.0 lb

## 2018-03-12 DIAGNOSIS — E782 Mixed hyperlipidemia: Secondary | ICD-10-CM

## 2018-03-12 DIAGNOSIS — E119 Type 2 diabetes mellitus without complications: Secondary | ICD-10-CM

## 2018-03-12 DIAGNOSIS — Z1331 Encounter for screening for depression: Secondary | ICD-10-CM

## 2018-03-12 DIAGNOSIS — L4050 Arthropathic psoriasis, unspecified: Secondary | ICD-10-CM | POA: Insufficient documentation

## 2018-03-12 DIAGNOSIS — B351 Tinea unguium: Secondary | ICD-10-CM

## 2018-03-12 DIAGNOSIS — R7989 Other specified abnormal findings of blood chemistry: Secondary | ICD-10-CM

## 2018-03-12 DIAGNOSIS — I10 Essential (primary) hypertension: Secondary | ICD-10-CM

## 2018-03-12 DIAGNOSIS — R945 Abnormal results of liver function studies: Secondary | ICD-10-CM

## 2018-03-12 LAB — HEPATIC FUNCTION PANEL
ALT: 119 U/L — ABNORMAL HIGH (ref 0–55)
AST (SGOT): 71 U/L — ABNORMAL HIGH (ref 5–34)
Albumin/Globulin Ratio: 1.4 (ref 0.9–2.2)
Albumin: 4.2 g/dL (ref 3.5–5.0)
Alkaline Phosphatase: 98 U/L (ref 38–106)
Bilirubin Direct: 0.3 mg/dL (ref 0.0–0.5)
Bilirubin Indirect: 0.4 mg/dL (ref 0.2–1.0)
Bilirubin, Total: 0.7 mg/dL (ref 0.2–1.2)
Globulin: 2.9 g/dL (ref 2.0–3.7)
Protein, Total: 7.1 g/dL (ref 6.0–8.3)

## 2018-03-12 LAB — HEPATITIS C ANTIBODY: Hepatitis C, AB: NONREACTIVE

## 2018-03-12 LAB — MICROALBUMIN, RANDOM URINE
Urine Creatinine, Random: 55.3 mg/dL
Urine Microalbumin, Random: 7 (ref 0.0–30.0)
Urine Microalbumin/Creatinine Ratio: 13 ug/mg (ref 0–30)

## 2018-03-12 LAB — HEPATITIS A ANTIBODY, IGM: Hep A IgM: NONREACTIVE

## 2018-03-12 LAB — HEPATITIS B SURFACE ANTIGEN W/ REFLEX TO CONFIRMATION: Hepatitis B Surface Antigen: NONREACTIVE

## 2018-03-12 LAB — HEMOLYSIS INDEX: Hemolysis Index: 5 (ref 0–18)

## 2018-03-12 LAB — GGT: GGT: 157 U/L — ABNORMAL HIGH (ref 12–64)

## 2018-03-12 MED ORDER — METFORMIN HCL 500 MG PO TABS
500.00 mg | ORAL_TABLET | Freq: Every morning | ORAL | 1 refills | Status: DC
Start: 2018-03-12 — End: 2018-11-05

## 2018-03-12 MED ORDER — CICLOPIROX 8 % EX SOLN
CUTANEOUS | 1 refills | Status: DC
Start: 2018-03-12 — End: 2019-04-09

## 2018-03-12 NOTE — Progress Notes (Signed)
Have you seen any specialists/other providers since your last visit with [place]?    Yes, Pain management, Cardiologist, Rheumatologist, Hematologist, Ortho, dermatologist    Arm preference verified?   Yes    The patient is due for various

## 2018-03-12 NOTE — Progress Notes (Signed)
Patient ID: Kent Jordan  is a 67 y.o.  male.     Chief Complaint   Patient presents with   . Essential hypertension   . Type 2 Diabetes   . Mixed hyperlipidemia   . Obesity      HPI    Problem   Psoriatic Arthritis    Stable on Enbrel, sees rheumatology     Elevated Lfts    Sees GI Dr. Malachy Chamber, h/o hemochromatosis, was on Zetia+Crestor, will hold Crestor    Lab Results   Component Value Date    ALT 110 (H) 02/28/2018    AST 63 (H) 02/28/2018    ALKPHOS 84 02/28/2018    BILITOTAL 0.8 02/28/2018         Type 2 Diabetes Mellitus Without Complication, Without Long-Term Current Use of Insulin    Medication(s): Metformin 500mg  daily  Side effects: none  Hypoglycemic episodes: none  Neuropathic Sx: no, follows with podiatry  Eye exam in the past year: Jan 2019, no retinopathy, follows with Dr. Jon Billings  PNA vaccine:~2016  Last urine microalbumin: normal June 2018    Review of labs showed:  Lab Results   Component Value Date    HGBA1C 5.2 02/28/2018    HGBA1C 5.1 03/12/2017    HGBA1C 6.3 (H) 06/29/2016          Other Hemochromatosis    Follows with hematology    Lab Results   Component Value Date    WBC 5.81 02/28/2018    HGB 15.2 02/28/2018    HCT 44.2 02/28/2018    MCV 95.3 02/28/2018    PLT 244 02/28/2018          Mixed Hyperlipidemia    Medication(s): Zetia 10mg , Crestor 5mg  (stopped 03/12/2018 d/t elevated LFT)  Side effects: Crestor causes rash at higher doses  32yr CVD risk:  DM2, HTN, former smoker; statin benefit    Denies CP/dyspnea/dizziness/HA/orthopnea/edema   Counseled on diet and exercise    Review of lipid panel shows:  Lab Results   Component Value Date    CHOL 153 02/28/2018    CHOL 171 03/12/2017    CHOL 149 05/30/2016     Lab Results   Component Value Date    TRIG 95 02/28/2018    TRIG 139 03/12/2017    TRIG 115 05/30/2016      Lab Results   Component Value Date    HDL 55 02/28/2018    HDL 72 03/12/2017    HDL 38 (L) 05/30/2016     Lab Results   Component Value Date    LDL 79 02/28/2018    LDL 71  03/12/2017    LDL 88 05/30/2016          Essential Hypertension    Goal BP < 140/90  Borderline BP in clinic today, controlled at home  Home BP: 120s/80s  Medications: amlodipine 10mg , HCTZ 25mg    Side Effects: no (avoid ARBs and ACEi d/t hyperkalemia and angioedema)  Diet/exercise: walking regularly  Smoking: quit 5 years ago, 35 pack year history  Symptoms: denies CP/SOB/dizziness/HA  Follows with cardiology    BP Readings from Last 3 Encounters:   03/12/18 163/78   09/11/17 140/78   08/26/17 (!) 174/93     Lab Results   Component Value Date    CREAT 1.0 02/28/2018    BUN 16.0 02/28/2018    NA 137 02/28/2018    K 4.1 02/28/2018    CL 101 02/28/2018    CO2  22 02/28/2018              The following portions of the patient's history were reviewed and updated as appropriate: current medications, allergies, past family history, past medical history, past social history, past surgical history and problem list.     Review of Systems   Constitutional: Negative for appetite change, chills, fatigue, fever and unexpected weight change.   HENT: Negative for congestion, rhinorrhea and sore throat.    Eyes: Negative for visual disturbance.   Respiratory: Negative for cough and shortness of breath.    Cardiovascular: Negative for chest pain and palpitations.   Gastrointestinal: Negative for abdominal pain and blood in stool.   Endocrine: Negative for cold intolerance and heat intolerance.   Genitourinary: Negative for difficulty urinating.   Musculoskeletal: Negative for arthralgias.   Neurological: Negative for dizziness, weakness, numbness and headaches.   Hematological: Negative for adenopathy.   Psychiatric/Behavioral: Negative for dysphoric mood. The patient is not nervous/anxious.    All other systems reviewed and are negative.         OBJECTIVE     BP 140/76   Pulse 73   Temp 97.9 F (36.6 C) (Oral)   Resp 16   Wt 94.8 kg (209 lb)   BMI 29.15 kg/m     Physical Exam    GENERAL : alert and in no distress   SKIN:  mild-moderate onychomycosis right great toe   Head: ncat  Neck: supple, no mass/thyromegaly/LAD, FROM  Eyes: PERRLA, EOMI  Ears: external canals clear, TMs clear, hearing intact to finger rub  Nose: patent, no turbinate swelling/discharge  Throat: normal oropharynx, no exudates/swelling  LUNGS: clear, no w/r/r  HEART: RRR normal S1 and S2  without gallop , murmur or rub  ABDOMEN  BS(+)  soft, non- tender , non- distended,  without organomegaly   Feet - no skin/nail lesions, toe sensation intact, 2+ dorsal pedis pulses   PSYCH: Normal mood and affect, linear thought process, appropriate interactions, no SI/hallucinations      ASSESSMENT      Kent Jordan is a 67 y.o. male with   1. Essential hypertension     2. Type 2 diabetes mellitus without complication, without long-term current use of insulin  Microalbumin, Random Urine    metFORMIN (GLUCOPHAGE) 500 MG tablet   3. Depression screening     4. Elevated LFTs  Hepatitis B (HBV) Surface Antigen    Hepatitis C (HCV) antibody, Total    Hepatitis A Antibody, IgM    Hepatic function panel    GGT   5. Other hemochromatosis     6. Onychomycosis  ciclopirox (PENLAC) 8 % solution   7. Mixed hyperlipidemia     8. Psoriatic arthritis            PLAN       1. Essential hypertension  - home BP at goal BP < 140/90, continue current regimen, borderline BP today d/t coffee and exertion  - Encourage DASH diet and regular exercise, keep home BP log, call if elevated      2. Type 2 diabetes mellitus without complication, without long-term current use of insulin  - Goal A1c < 7, good control, encourage diabetic diet and regular exercise, continue current regimen   - Microalbumin, Random Urine  - metFORMIN (GLUCOPHAGE) 500 MG tablet; Take 1 tablet (500 mg total) by mouth every morning with breakfast  Dispense: 90 tablet; Refill: 1    3. Depression screening  4. Elevated LFTs  - avoid hepatotoxin, labs as ordered, hold Crestor as LDL controlled with Zetia  - Hepatitis B (HBV) Surface  Antigen  - Hepatitis C (HCV) antibody, Total  - Hepatitis A Antibody, IgM  - Hepatic function panel  - GGT    5. Other hemochromatosis  - elevated LFT, f/u hematology    6. Onychomycosis  - ciclopirox (PENLAC) 8 % solution; Apply over nail and surrounding skin. Apply nightly over previous coat. After seven (7) days, may remove with alcohol and continue cycle.  Dispense: 6.6 mL; Refill: 1    7. Mixed hyperlipidemia  - Goal LDL < 100, controlled with Zetia, hold Crestor d/t elevated LFT, no sfx, encourage diet and exercise     8. Psoriatic arthritis  - stable, cont Enbrel, f/u with rheumatology     Call if symptoms persist, worsen, or change.  Call with updates/questions/concerns.    F/u 3 months    Medication list reviewed with patient and updated as indicated.  Risk & Benefits of any new medication(s) were explained to the patient who verbalized understanding & agreed to the treatment plan.  Patient given a printed copy of AVS. Patient verbalized understanding of instructions given.

## 2018-03-14 ENCOUNTER — Inpatient Hospital Stay: Payer: BLUE CROSS/BLUE SHIELD

## 2018-03-14 DIAGNOSIS — G8929 Other chronic pain: Secondary | ICD-10-CM | POA: Insufficient documentation

## 2018-03-14 DIAGNOSIS — M25521 Pain in right elbow: Secondary | ICD-10-CM | POA: Insufficient documentation

## 2018-03-14 NOTE — PT/OT Therapy Note (Signed)
Name: Kent Jordan Age: 67 y.o.   Referring Physician: Jerrol Banana, MD   Date of Injury: 02/19/2018  Date Care Plan Established/Reviewed: 03/10/2018  Date Treatment Started: 03/10/2018  Visit Count: 2   Diagnosis:   1. Chronic elbow pain, right        Subjective     Social Support/Occupation  Lives in: multiple level home  Lives with: spouse  Occupation: retired    I stopped doing the weight exercise because I was getting a constant ache  When I bend it I feel it on the inside of the elbow and straightening I feel on the outside                 Treatment     Therapeutic Exercises   Review/update of HEP, all of below with verbal/tactile cues:  Pivot prone   TB B ER  TB rows  D/C resisted wrist ext    Access Code: [email or web address]   URL: [email or web address]   Date: 03/14/2018   Prepared by: [facility]      Exercises Standing Shoulder Row with Anchored Resistance - 15-20 reps - 1-2 sets - 5 seconds hold - 2x daily   Standing Shoulder External Rotation with Resistance - 10 reps - 1-2 sets - 10 seconds hold - 2x daily       Neuromuscular Re-Education   R Scapular PD with end range holds for NMR to improve scapular stability for improved postural control, use of UE and pelvic for irradiation       Manual Therapy   STM wrist flexors, extensors, elbow flexors including strumming and unilateral parallels  BC radius, radius on axis   SC jt gapping with AE/PD of scap  scap distraction from ribcage        ---      ---   Total Time   Timed Minutes  43 minutes   Total Time  43 minutes        Assessment   Pt EFT R UE improved from unable to initiate RTC/scap to efficient initiation by end of sessions. Pt VERY challenged with scap NMR and would benefit from continued scap stability and strengthening to decrease load on R elbow.    Plan   scap stability      Goals    Goal 1:  R elbow extension equal to L for painfree reaching    Met at end of session today  03/10/2018 MM   Sessions:  6      Goal 2:  Pt will report  ability to sleep waking less than 2 times nightly to reposition using positioning techniques to increase restful sleep and ease OOB transition in AM   Sessions:  12      Goal 3:  Grip strength R UE equal to L for painfree lifting of groceries    Sessions:  [address], PT

## 2018-03-17 ENCOUNTER — Inpatient Hospital Stay: Payer: BLUE CROSS/BLUE SHIELD

## 2018-03-17 DIAGNOSIS — G8929 Other chronic pain: Secondary | ICD-10-CM

## 2018-03-17 DIAGNOSIS — M25521 Pain in right elbow: Secondary | ICD-10-CM | POA: Insufficient documentation

## 2018-03-17 NOTE — PT/OT Therapy Note (Signed)
Name: LYLE NIBLETT Age: 67 y.o.   Referring Physician: Jerrol Banana, MD   Date of Injury: 02/19/2018  Date Care Plan Established/Reviewed: 03/10/2018  Date Treatment Started: 03/10/2018  Visit Count: 3   Diagnosis:   1. Chronic elbow pain, right        Subjective     Social Support/Occupation  Lives in: multiple level home  Lives with: spouse  Occupation: retired    I feel on my right side Friday night at my house when my dog pulled me. I landed on my elbow and it has been aching.   I have done the band exercises you gave me.                 Treatment     Therapeutic Exercises   Review/update of HEP, all of below with verbal/tactile cues:  Pelvic tilts, BKFO and/or LTR  Pt edu re acute care guidelines including POS and PUP principle, use of ice     Neuromuscular Re-Education   L Scapular PD with end range holds and COI for NMR to improve scapular stability for improved postural control  Use of resisted L pelvic AE for irradiation       Manual Therapy   Elbow extension BC prox ulna  STM wrist extensors    S/L  Springing of R ribcage with resisted scap/pelvic patterns  Superficial fascia lateral ribcage, STM serratus anterior, lat with overhead reaching  Bony contours of ribs and STM intercostals with breathing           ---      ---   Total Time   Timed Minutes  41 minutes   Total Time  41 minutes        Assessment   Pt able to maintain end range elbow extension with minimal discomfort, resisted shoulder flex MMT improved from sluggish initiation to more efficient after NMR above. PT currently more limited by recent injury to his lower back and we discussed acute are guidelines. If no relief over next few days, pt may obtain script for LB and be seen for L-spine for remaining scheduled sessions as he most likely will not require further care for elbow - re-assess next session  Plan   See above       Goals    Goal 1:  R elbow extension equal to L for painfree reaching    Met at end of session today  03/10/2018 MM    Sessions:  6   Progression:  met      Goal 2:  Pt will report ability to sleep waking less than 2 times nightly to reposition using positioning techniques to increase restful sleep and ease OOB transition in AM       Sessions:  12   Progression:  progressing       Goal 3:  Grip strength R UE equal to L for painfree lifting of groceries    Sessions:  12                                   Algis Greenhouse, PT

## 2018-03-18 NOTE — PT/OT Therapy Note (Signed)
Name: Kent Jordan Age: 67 y.o.   Referring Physician: Jerrol Banana, MD   Date of Injury: 12/04/2017  Date Care Plan Established/Reviewed: 12/25/2017  Date Treatment Started: 12/25/2017  Visit Count: 8   Diagnosis:   1. Cervicalgia    2. Muscle weakness    3. Abnormal posture          Discontinuation of Therapy Services    [name] did not complete prescribed physical therapy visits.      Status is unknown at this time, physical therapy has been discontinued and patient has been discharged from care.  The last therapy note is below for review.    Please feel free to contact me with any questions regarding the care of Kent Jordan.    Sincerely,    Lawernce Ion, PT, DPT        03/18/2018                            ---      ---   Total Time   Timed Minutes  43 minutes   Total Time  43 minutes           Goals    Goal 1:  Pt will be proficient with HEP particiaption to help improve ADL completion and decrease functional limitations. Pt's current HEP:    Pivot Prone  Chin tucks   Sessions:  16   Progression:  new      Goal 2:  Pt will increase B/L cervical rotation to at least 60 degrees without increased pain to allow for safer driving and improved ADL completion.    Sessions:  16   Progression:  new       Goal 3:  Pt will be able to look down and cook for at least 20 min without increased cervical pain.    Sessions:  16   Progression:  new      Goal 4:  Pt will increase B/L UE strength to at least 5-5 throughout to be able to complete necessary ADL's without increased cervical pain.    Sessions:  16   Progression:  new          Goal 5:  Pt will be proficient with HEP particiaption to help improve ADL completion and decrease functional limitations. Pt's current HEP:    Seated chin tucks  Pivot prone    Sessions:  16   Progression:  new                         Lawernce Ion, PT, DPT

## 2018-03-24 ENCOUNTER — Inpatient Hospital Stay: Payer: BLUE CROSS/BLUE SHIELD

## 2018-03-28 ENCOUNTER — Inpatient Hospital Stay: Payer: BLUE CROSS/BLUE SHIELD

## 2018-04-15 NOTE — PT/OT Therapy Note (Signed)
Discontinuation of Therapy Services    Conlan Miceli Frein did not complete prescribed physical therapy visits.      Status is unknown at this time, physical therapy has been discontinued and patient has been discharged from care.  The last therapy note is below for review.    Please feel free to contact me with any questions regarding the care of [facility].    Sincerely,    Roosvelt Harps, [place] [facility][id number]         04/15/2018

## 2018-04-17 ENCOUNTER — Encounter (INDEPENDENT_AMBULATORY_CARE_PROVIDER_SITE_OTHER): Payer: Self-pay

## 2018-06-06 ENCOUNTER — Ambulatory Visit (INDEPENDENT_AMBULATORY_CARE_PROVIDER_SITE_OTHER): Payer: Self-pay | Admitting: Cardiovascular Disease

## 2018-06-12 ENCOUNTER — Ambulatory Visit (INDEPENDENT_AMBULATORY_CARE_PROVIDER_SITE_OTHER): Payer: BLUE CROSS/BLUE SHIELD | Admitting: Student in an Organized Health Care Education/Training Program

## 2018-06-18 ENCOUNTER — Encounter (INDEPENDENT_AMBULATORY_CARE_PROVIDER_SITE_OTHER): Payer: Self-pay

## 2018-07-30 ENCOUNTER — Ambulatory Visit (INDEPENDENT_AMBULATORY_CARE_PROVIDER_SITE_OTHER): Payer: Medicare Other | Admitting: Student in an Organized Health Care Education/Training Program

## 2018-07-30 ENCOUNTER — Encounter (INDEPENDENT_AMBULATORY_CARE_PROVIDER_SITE_OTHER): Payer: Self-pay | Admitting: Student in an Organized Health Care Education/Training Program

## 2018-07-30 VITALS — BP 152/76 | HR 69 | Temp 97.8°F | Ht 71.0 in | Wt 212.0 lb

## 2018-07-30 DIAGNOSIS — R945 Abnormal results of liver function studies: Secondary | ICD-10-CM

## 2018-07-30 DIAGNOSIS — M546 Pain in thoracic spine: Secondary | ICD-10-CM

## 2018-07-30 DIAGNOSIS — I10 Essential (primary) hypertension: Secondary | ICD-10-CM

## 2018-07-30 DIAGNOSIS — E782 Mixed hyperlipidemia: Secondary | ICD-10-CM

## 2018-07-30 DIAGNOSIS — E119 Type 2 diabetes mellitus without complications: Secondary | ICD-10-CM

## 2018-07-30 DIAGNOSIS — R7989 Other specified abnormal findings of blood chemistry: Secondary | ICD-10-CM

## 2018-07-30 MED ORDER — LIDOCAINE 5 % EX PTCH
1.00 | MEDICATED_PATCH | Freq: Every day | CUTANEOUS | 1 refills | Status: DC | PRN
Start: 2018-07-30 — End: 2019-03-20

## 2018-07-30 NOTE — Progress Notes (Signed)
Patient ID: Kent Jordan  is a 67 y.o.  male.     Chief Complaint   Patient presents with   . Essential hypertension      Back Pain   This is a new problem. The current episode started in the past 7 days. The problem occurs constantly. The pain is present in the thoracic spine. The quality of the pain is described as aching. The pain does not radiate. The pain is moderate. The pain is worse during the day. Pertinent negatives include no abdominal pain, bladder incontinence, bowel incontinence, chest pain, fever, headaches, numbness, tingling or weakness. He has tried NSAIDs and analgesics for the symptoms. The treatment provided mild relief.       Problem   Elevated Lfts    Sees GI Dr. Malachy Chamber, h/o hemochromatosis, was on Zetia+Crestor, will hold Crestor      Lab Results   Component Value Date    ALT 119 (H) 03/12/2018    AST 71 (H) 03/12/2018    GGT 157 (H) 03/12/2018    ALKPHOS 98 03/12/2018    BILITOTAL 0.7 03/12/2018         Type 2 Diabetes Mellitus Without Complication, Without Long-Term Current Use of Insulin    Medication(s): Metformin 500mg  daily  Side effects: none  Hypoglycemic episodes: none  Neuropathic Sx: no, follows with podiatry  Eye exam in the past year: Jan 2019, no retinopathy, follows with Dr. Jon Billings  PNA vaccine:~2016  Last urine microalbumin: normal June 2018      Review of labs showed:  Lab Results   Component Value Date    HGBA1C 5.2 02/28/2018    HGBA1C 5.1 03/12/2017    HGBA1C 6.3 (H) 06/29/2016          Other Hemochromatosis    Follows with hematology Dr. Bing Matter      Lab Results   Component Value Date    WBC 5.81 02/28/2018    HGB 15.2 02/28/2018    HCT 44.2 02/28/2018    MCV 95.3 02/28/2018    PLT 244 02/28/2018          Mixed Hyperlipidemia    Medication(s): Zetia 10mg , Crestor 5mg  (stopped 03/12/2018 d/t elevated LFT)  Side effects: Crestor causes rash at higher doses  79yr CVD risk:  DM2, HTN, former smoker; statin benefit       Denies CP/dyspnea/dizziness/HA/orthopnea/edema    Counseled on diet and exercise    Review of lipid panel shows:  Lab Results   Component Value Date    CHOL 153 02/28/2018    CHOL 171 03/12/2017    CHOL 149 05/30/2016     Lab Results   Component Value Date    TRIG 95 02/28/2018    TRIG 139 03/12/2017    TRIG 115 05/30/2016      Lab Results   Component Value Date    HDL 55 02/28/2018    HDL 72 03/12/2017    HDL 38 (L) 05/30/2016     Lab Results   Component Value Date    LDL 79 02/28/2018    LDL 71 03/12/2017    LDL 88 05/30/2016          Essential Hypertension    Goal BP < 140/90  Borderline BP in clinic today, controlled at home  Sees cardiologist Dr. Ronnell Freshwater coat HTN    Home BP: 120-130s/80s  Medications: amlodipine 10mg , HCTZ 25mg , Metoprolol XL 12.5mg   Side Effects: no (avoid ARBs and ACEi d/t hyperkalemia  and angioedema)  Diet/exercise: walking regularly  Smoking: quit 5 years ago, 35 pack year history  Symptoms: denies CP/SOB/dizziness/HA  Follows with cardiology    BP Readings from Last 3 Encounters:   07/30/18 152/76   03/12/18 140/76   09/11/17 140/78     Lab Results   Component Value Date    CREAT 1.0 02/28/2018    BUN 16.0 02/28/2018    NA 137 02/28/2018    K 4.1 02/28/2018    CL 101 02/28/2018    CO2 22 02/28/2018              The following portions of the patient's history were reviewed and updated as appropriate: current medications, allergies, past family history, past medical history, past social history, past surgical history and problem list.     Review of Systems   Constitutional: Negative for appetite change, chills, fatigue, fever and unexpected weight change.   HENT: Negative for congestion, rhinorrhea and sore throat.    Eyes: Negative for visual disturbance.   Respiratory: Negative for cough and shortness of breath.    Cardiovascular: Negative for chest pain and palpitations.   Gastrointestinal: Negative for abdominal pain, blood in stool and bowel incontinence.   Endocrine: Negative for cold intolerance and heat intolerance.    Genitourinary: Negative for bladder incontinence and difficulty urinating.   Musculoskeletal: Positive for back pain. Negative for arthralgias.   Neurological: Negative for dizziness, tingling, weakness, numbness and headaches.   Hematological: Negative for adenopathy.   Psychiatric/Behavioral: Negative for dysphoric mood. The patient is not nervous/anxious.    All other systems reviewed and are negative.         OBJECTIVE     BP 152/76 (BP Site: Left arm, Patient Position: Sitting)   Pulse 69   Temp 97.8 F (36.6 C) (Oral)   Ht 1.803 m (5\' 11" )   Wt 96.2 kg (212 lb)   BMI 29.57 kg/m     Physical Exam    GENERAL : alert and in no distress   SKIN: warm and dry, w/o rash, normal turgor   Head: ncat  Neck: supple, no mass/thyromegaly/LAD, FROM  Eyes: PERRLA, EOMI  Ears: external canals clear, TMs clear, hearing intact to finger rub  Nose: patent, no turbinate swelling/discharge  Throat: normal oropharynx, no exudates/swelling  LUNGS: clear, no w/r/r  HEART: RRR normal S1 and S2  without gallop , murmur or rub  ABDOMEN  BS(+)  soft, non- tender , non- distended,  without organomegaly   PSYCH: Normal mood and affect, linear thought process, appropriate interactions, no SI/hallucinations      ASSESSMENT      Kent Jordan is a 67 y.o. male with   1. Essential hypertension     2. Type 2 diabetes mellitus without complication, without long-term current use of insulin     3. Mixed hyperlipidemia  Comprehensive metabolic panel    Lipid panel   4. Other hemochromatosis     5. Elevated LFTs     6. Acute bilateral thoracic back pain  lidocaine (LIDODERM) 5 %          PLAN       1. Essential hypertension  - home BP at goal BP < 140/90, continue current regimen  - Encourage DASH diet and regular exercise, keep home BP log, call if elevated      2. Type 2 diabetes mellitus without complication, without long-term current use of insulin  - Goal A1c < 7, excellent control, encourage  diabetic diet and regular exercise,  continue current regimen     3. Mixed hyperlipidemia  - Goal LDL < 100, controlled, statin benefit, cannot tolerate statin, cont Zetia, no sfx, encourage diet and exercise   - Comprehensive metabolic panel; Future  - Lipid panel; Future    4. Other hemochromatosis  - stable, routine f/u with hematology    5. Elevated LFTs  - check LFT, avoid hepatotoxins    6. Acute bilateral thoracic back pain  - Encourage home stretching exercise, avoid mod-heavy lifting, cannot tolerate NSAIDs, prn analgesic patch.   - lidocaine (LIDODERM) 5 %; Place 1 patch onto the skin daily as needed (back pain) Remove & Discard patch within 12 hours or as directed by MD  Dispense: 30 each; Refill: 1     Call if symptoms persist, worsen, or change.  Call with updates/questions/concerns.    F/u 3 months    Medication list reviewed with patient and updated as indicated.  Risk & Benefits of any new medication(s) were explained to the patient who verbalized understanding & agreed to the treatment plan.  Patient given a printed copy of AVS. Patient verbalized understanding of instructions given.

## 2018-08-14 ENCOUNTER — Other Ambulatory Visit: Payer: Self-pay | Admitting: Physical Medicine & Rehabilitation

## 2018-08-14 DIAGNOSIS — M961 Postlaminectomy syndrome, not elsewhere classified: Secondary | ICD-10-CM

## 2018-08-14 DIAGNOSIS — M5134 Other intervertebral disc degeneration, thoracic region: Secondary | ICD-10-CM

## 2018-08-14 DIAGNOSIS — L4050 Arthropathic psoriasis, unspecified: Secondary | ICD-10-CM

## 2018-08-18 ENCOUNTER — Ambulatory Visit
Admission: RE | Admit: 2018-08-18 | Discharge: 2018-08-18 | Disposition: A | Discharge status: Home / Self Care | Payer: Medicare Other | Source: Ambulatory Visit | Attending: Physical Medicine & Rehabilitation | Admitting: Physical Medicine & Rehabilitation

## 2018-08-18 ENCOUNTER — Other Ambulatory Visit (FREE_STANDING_LABORATORY_FACILITY): Payer: Medicare Other

## 2018-08-18 DIAGNOSIS — M961 Postlaminectomy syndrome, not elsewhere classified: Secondary | ICD-10-CM | POA: Insufficient documentation

## 2018-08-18 DIAGNOSIS — M76891 Other specified enthesopathies of right lower limb, excluding foot: Secondary | ICD-10-CM | POA: Insufficient documentation

## 2018-08-18 DIAGNOSIS — M5134 Other intervertebral disc degeneration, thoracic region: Secondary | ICD-10-CM

## 2018-08-18 DIAGNOSIS — L4050 Arthropathic psoriasis, unspecified: Secondary | ICD-10-CM | POA: Insufficient documentation

## 2018-08-18 DIAGNOSIS — R937 Abnormal findings on diagnostic imaging of other parts of musculoskeletal system: Secondary | ICD-10-CM | POA: Insufficient documentation

## 2018-08-18 DIAGNOSIS — E782 Mixed hyperlipidemia: Secondary | ICD-10-CM

## 2018-08-18 LAB — COMPREHENSIVE METABOLIC PANEL
ALT: 138 U/L — ABNORMAL HIGH (ref 0–55)
AST (SGOT): 59 U/L — ABNORMAL HIGH (ref 5–34)
Albumin/Globulin Ratio: 1.6 (ref 0.9–2.2)
Albumin: 4.2 g/dL (ref 3.5–5.0)
Alkaline Phosphatase: 103 U/L (ref 38–106)
BUN: 18 mg/dL (ref 9.0–28.0)
Bilirubin, Total: 0.8 mg/dL (ref 0.2–1.2)
CO2: 22 mEq/L (ref 21–29)
Calcium: 9.6 mg/dL (ref 8.5–10.5)
Chloride: 103 mEq/L (ref 100–111)
Creatinine: 1.1 mg/dL (ref 0.5–1.5)
Globulin: 2.7 g/dL (ref 2.0–3.7)
Glucose: 195 mg/dL — ABNORMAL HIGH (ref 70–100)
Potassium: 4.1 mEq/L (ref 3.5–5.1)
Protein, Total: 6.9 g/dL (ref 6.0–8.3)
Sodium: 138 mEq/L (ref 136–145)

## 2018-08-18 LAB — LIPID PANEL
Cholesterol / HDL Ratio: 5.4
Cholesterol: 243 mg/dL — ABNORMAL HIGH (ref 0–199)
HDL: 45 mg/dL (ref 40–9999)
LDL Calculated: 165 mg/dL — ABNORMAL HIGH (ref 0–99)
Triglycerides: 167 mg/dL — ABNORMAL HIGH (ref 34–149)
VLDL Calculated: 33 mg/dL (ref 10–40)

## 2018-08-18 LAB — HEMOLYSIS INDEX: Hemolysis Index: 10 (ref 0–18)

## 2018-08-18 LAB — GFR: EGFR: >60

## 2018-08-19 ENCOUNTER — Ambulatory Visit
Admission: RE | Admit: 2018-08-19 | Discharge: 2018-08-19 | Disposition: A | Discharge status: Home / Self Care | Payer: Medicare Other | Source: Ambulatory Visit | Attending: Physical Medicine & Rehabilitation | Admitting: Physical Medicine & Rehabilitation

## 2018-08-19 DIAGNOSIS — M76891 Other specified enthesopathies of right lower limb, excluding foot: Secondary | ICD-10-CM | POA: Insufficient documentation

## 2018-08-19 DIAGNOSIS — M961 Postlaminectomy syndrome, not elsewhere classified: Secondary | ICD-10-CM | POA: Insufficient documentation

## 2018-08-19 DIAGNOSIS — L4050 Arthropathic psoriasis, unspecified: Secondary | ICD-10-CM | POA: Insufficient documentation

## 2018-08-19 DIAGNOSIS — M47816 Spondylosis without myelopathy or radiculopathy, lumbar region: Secondary | ICD-10-CM | POA: Insufficient documentation

## 2018-08-19 DIAGNOSIS — M159 Polyosteoarthritis, unspecified: Secondary | ICD-10-CM | POA: Insufficient documentation

## 2018-08-19 MED ORDER — TECHNETIUM TC 99M MEDRONATE INJECTION
26.50 | Freq: Once | Status: AC | PRN
Start: 2018-08-19 — End: 2018-08-19
  Administered 2018-08-19: 08:00:00 26.5 via INTRAVENOUS

## 2018-08-25 ENCOUNTER — Other Ambulatory Visit (INDEPENDENT_AMBULATORY_CARE_PROVIDER_SITE_OTHER): Payer: Self-pay | Admitting: Student in an Organized Health Care Education/Training Program

## 2018-08-25 DIAGNOSIS — E782 Mixed hyperlipidemia: Secondary | ICD-10-CM

## 2018-08-26 ENCOUNTER — Other Ambulatory Visit (INDEPENDENT_AMBULATORY_CARE_PROVIDER_SITE_OTHER): Payer: Self-pay | Admitting: Student in an Organized Health Care Education/Training Program

## 2018-08-26 DIAGNOSIS — E782 Mixed hyperlipidemia: Secondary | ICD-10-CM

## 2018-08-26 MED ORDER — ROSUVASTATIN CALCIUM 5 MG PO TABS
5.0000 mg | ORAL_TABLET | Freq: Every day | ORAL | 1 refills | Status: DC
Start: 2018-08-26 — End: 2019-03-03

## 2018-09-25 ENCOUNTER — Other Ambulatory Visit: Payer: Self-pay | Admitting: Physical Medicine & Rehabilitation

## 2018-10-10 ENCOUNTER — Other Ambulatory Visit (INDEPENDENT_AMBULATORY_CARE_PROVIDER_SITE_OTHER): Payer: Self-pay | Admitting: Student in an Organized Health Care Education/Training Program

## 2018-10-24 ENCOUNTER — Telehealth (INDEPENDENT_AMBULATORY_CARE_PROVIDER_SITE_OTHER): Payer: Self-pay | Admitting: Student in an Organized Health Care Education/Training Program

## 2018-10-24 NOTE — Telephone Encounter (Signed)
FAXED

## 2018-10-24 NOTE — Telephone Encounter (Signed)
Kent Jordan called from Dr. Army Fossa office. She is requesting for the pts BW to be faxed over from 07/2018. Please advise, thank you!    Fax: [phone]

## 2018-11-05 ENCOUNTER — Other Ambulatory Visit (INDEPENDENT_AMBULATORY_CARE_PROVIDER_SITE_OTHER): Payer: Self-pay | Admitting: Student in an Organized Health Care Education/Training Program

## 2018-11-05 ENCOUNTER — Encounter (INDEPENDENT_AMBULATORY_CARE_PROVIDER_SITE_OTHER): Payer: Self-pay

## 2018-11-05 DIAGNOSIS — E119 Type 2 diabetes mellitus without complications: Secondary | ICD-10-CM

## 2018-11-05 MED ORDER — METFORMIN HCL ER 500 MG PO TB24
500.00 mg | ORAL_TABLET | Freq: Every morning | ORAL | 1 refills | Status: DC
Start: 2018-11-05 — End: 2019-02-25

## 2018-11-21 ENCOUNTER — Inpatient Hospital Stay: Payer: Medicare Other | Attending: Physical Medicine & Rehabilitation

## 2018-11-21 DIAGNOSIS — M5441 Lumbago with sciatica, right side: Secondary | ICD-10-CM | POA: Insufficient documentation

## 2018-11-21 DIAGNOSIS — M25551 Pain in right hip: Secondary | ICD-10-CM | POA: Insufficient documentation

## 2018-11-21 NOTE — Progress Notes (Signed)
Name:Kent Jordan Age: 68 y.o.   Date of Service: 11/21/2018  Referring Physician: Raleigh Callas, MD   Date of Injury: 10/28/2018  Date Care Plan Established/Reviewed: 11/21/2018  Date Treatment Started: 11/21/2018  End of Certification Date: 02/18/2019  Sessions in Plan of Care: 16  Surgery Date: No data was found      Visit Count: 1   Diagnosis:   1. Hip pain, acute, right    2. Acute bilateral low back pain with right-sided sciatica        Subjective     History of Present Illness   History of Present Illness: R hip pain. It started in November and was diagnosed with bursitis and tendonitis in the hip. No MOI. Started having trouble sleeping on R side. The pain in the hamstring limits my walking   Long standing history of back pain - pt has OA and psoriatic arthritis.   Numbness in toes L foot and R big toe which started after fusion, no worsening recently.     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  Functional Limitations (PLOF): Marland Kitchen  Sleeping esp on R side (no issues previously)  Walking / standing - unable to fully WB R LE (no issues previously)  Sitting > 5-10 min has to shift weight away from R side (no issues previously)  Turning to the left in standing loses balance (has been happening for a while)    Pain   R greater trochanter - throbbing    R hamstring - shooting pain    Social Support/Occupation  Lives in: multiple level home  Lives with: spouse  Occupation: retired      Precautions: No data was found  Allergies: Angiotensin receptor blockers; Ciprofloxacin; Ace inhibitors; and Gabapentin    Past Medical History:   Diagnosis Date    Arrhythmia     when on ARB    Arthritis     psoriatic and osteoarthritis     Back pain     Blood disorder 11/2014     Hemochromatosis    Diabetes mellitus 2016    started metformin 05/2016 d/t A1C 7.1 - now checking BS 2x/day - average 70-110 in the last week - A1C on 06/29/16 = 6.3 , has also lost 16 lbs since 05/2016    Difficulty in walking(719.7)     Uses cane to ambulate    Elevated liver function tests     had abd u/s 07/05/16     Failed back syndrome     Fatty liver     Hemochromatosis     phlebotomy every 3 months - last done 02/2016    High cholesterol     Hypertensive disorder 06/2016    norvasc recently increased to 10 mg daily - now controlled on meds - most recent BP 117/73    Low back pain     Lung nodule 2012    benign - was monitored x  3 yrs by Dr. Bernita Raisin (Pulm) - signed off from pulmonology - no further f/u required since 2015    Neuropathy     Tingling left foot, numbness left toes    Psoriasis     Psoriatic arthritis     tx w/ enbrel    S/P  insertion of spinal cord stimulator 2015    Sleep apnea     mild - does not have CPAP - was unable to tolerate, less snoring since turboplasty       Objective   L/S AROM  Flex - fingertips to knees with sharp jolting p! Into R LE  Ext and SB - min ROM, p! /stuiffness localized to lumbar spine     Range of Motion        Left AROM    Left PROM   Lumbar/Hip    Right AROM    Right PROM       Lumbar Rotation         90  Hip Flexion 90          Hip Extension           Hip Abduction           Hip Adduction         -5  Hip IR   -20      45  Hip ER   30    (blank fields were intentionally left blank)          Strength        Left Strength  Hip      Right   5  Hip Flexion 3- hip flexion pain     Hip Extension       Hip Abduction       Hip Adduction       Hip IR       Hip ER       Quadriceps       Hamstrings     (blank fields were intentionally left blank)       Left Strength  Knee      Right   5-  Knee Flexion 4 pain   5  Knee Extension 4    (blank fields were intentionally left blank)       Left Strength  Ankle/Foot      Right   4  Ankle Dorsiflexion 4+    4  Ankle  Plantarflexion 4+      Ankle Inversion       Ankle Eversion     (blank fields were intentionally left blank)    Tests       Thoracic   Positive slump (R LE -60 deg from knee extension ).   Lumbar/Pelvic Girdle/Sacrum   Positive slump (R LE -60 deg from knee extension ).    Right Hip   Positive FABER.     Neurological Testing     Sensation     Ankle/Foot   Left Ankle/Foot   Diminished: light touch    Right Ankle/Foot   Diminished: light touch             Treatment     Therapeutic Exercises   Justification: To improve patient education / understanding   Pt edu re: exam findings with pertinent anatomy with help of model/pictures, role of PT, POC      Manual Therapy   Prone  SF/BC sacrum, coccyx, SIJs  P/A mobs grade 3,4 of sacro-coccygeal joint with CR resisted hip IR and/or knee flexion   Inferior mobs grade 3,4 of sacrum with AROM knee flexion and/or LTR  Billed with TE         ---      ---   Total Time   Timed Minutes  15 minutes   Untimed Minutes  25 minutes  Total Time  40 minutes        Assessment   Kent Jordan is a 68 y.o. male presenting with R hip p! / R LE p! who requires Physical Therapy for the following:  Impairments:   IPTC Impairments: Pain that limits and interferes with functional ability  Impaired postural alignment  Decreased range of motion  Decreased strength  Decreased functional stability  Decreased joint mobility  Decreased soft tissue mobility  Decreased/impaired motor control  Neural tension  Decreased coordination  Decreased static balance  Decreased dynamic balance    Pain located: R hip, R LE    Clinical presentation: unstable - pain that occurs without a predictable pattern or stimulus  Barriers to therapy: Past surgical history - surgical intervention to regionally interdependent body region  Comorbidities - diabetes which will slow healing times    Prior Level of Function: .  Sleeping esp on R side (no issues previously)  Walking / standing - unable to fully WB R LE (no issues  previously)  Sitting > 5-10 min has to shift weight away from R side (no issues previously)  Turning to the left in standing loses balance (has been happening for a while)  Prognosis: excellent  Plan   Visits per week: 2  Number of Sessions: 16  Direct One on One  40981: Therapeutic Exercise: To Develop Strength and Endurance, ROM and Flexibility  [id number]: Gait Training  19147: Neuromuscular Reeducation (Proprioceptive Neuromuscular Faciliation)  97140: Manual Therapy techniques (mobilization, manipulation, manual traction) (Grade I-V to lumbar spine, pelvic girdle, and regionally interdependent joints, soft tissue mobilization, instrument assisted soft tissue mobilization.)  97530: Therapeutic Activities: Dynamic activities to improve functional performance  Dry Needling  Supervised Modalities  97010: Thermal modalities: hot/cold packs  82956: Mechnical traction  97014: Electrical stimulation    Goals    Goal 1:  Patient will demonstrate independence in prescribed HEP 1x/day with proper form, sets and reps for safe discharge to an independent program.   See below for current HEP:     Sessions:  6      Goal 2:  Pt will demonstrate increase in FOTO score by at least MDC to return to PLOF       FOTO score at IE = 42  MDC = 6     Sessions:  16      Goal 3:  Pt will demonstrate negative slump test to allow for community ambulation x 30 min with full wt acceptance R LE   Sessions:  16      Goal 4:  Pt will demo neutral stacked alignment in sitting in appropriate self selected seat height with use of supports as needed to allow sitting daily for ADL's without limitations   Sessions:  16          Goal 5:  Pt will report ability to sleep waking less than 2 times nightly to reposition using positioning techniques to increase restful sleep and ease OOB transition in AM   Sessions:  16                         Deitra Craine Alford Highland, PT

## 2018-11-24 ENCOUNTER — Inpatient Hospital Stay: Payer: Medicare Other | Attending: Physical Medicine & Rehabilitation

## 2018-11-24 DIAGNOSIS — M5441 Lumbago with sciatica, right side: Secondary | ICD-10-CM | POA: Insufficient documentation

## 2018-11-24 DIAGNOSIS — M25551 Pain in right hip: Secondary | ICD-10-CM | POA: Insufficient documentation

## 2018-11-24 NOTE — PT/OT Therapy Note (Signed)
Name: LIVIO LEDWITH Age: 68 y.o.   Date of Service: 11/24/2018  Referring Physician: Raleigh Callas, MD   Date of Injury: 10/28/2018  Date Care Plan Established/Reviewed: 11/21/2018  Date Treatment Started: 11/21/2018  End of Certification Date: 02/18/2019  Sessions in Plan of Care: 16  Surgery Date: No data was found    Visit Count: 2   Diagnosis:   1. Hip pain, acute, right    2. Acute bilateral low back pain with right-sided sciatica        Subjective     Social Support/Occupation  Lives in: multiple level home  Lives with: spouse  Occupation: retired    "I had so much pain in the tailbone from last session, it was hard to move. The soreness just went away this morning. Things are still feeling about the same. I took the dog for a walk this morning. I did feel pain that shot down the R LE and the leg feels weak when I'm walking."      Precautions: No data was found  Allergies: Angiotensin receptor blockers; Ciprofloxacin; Ace inhibitors; and Gabapentin    Objective   Fatiguing weakness in the R LE. Most prominent in the R HS.     Positive Gillet's on the R.             Treatment     Therapeutic Exercises   Justification: To educated pt fully and improve strength.  Pt educated on anatomical structures that are relevant to his current issues.    Side lying clamshells x15 with verbal/tactile cueing to complete the exercise properly.     Pt advised to use ice after today's session to help prevent soreness as a result of the MT.     Neuromuscular Re-Education   Justification: To help improve core engagement.   Seated ab bracing 10x5" with verbal/tactile cueing to complete the exercise properly. (billed with TA)    Manual Therapy   Justification: To help decrease myofascial restrictions and improve mobility of the pelvis.   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     STM to the R piriformis    Grade III-IV gaping of the R SIJ in L side lying. Manual contact at the R iliac crest.     Therapeutic Activity   Justification: To  help decrease lumbar spine stress with sit to stands.   Brief education on sit to stand with emphasis on hip hinging and pushing the feet into the floor as he stands.        ---      ---   Total Time   Timed Minutes  45 minutes   Total Time  45 minutes        Assessment   Improved mobility of the innominate on the sacrum post tx. Pt was able to complete a sit to stand without pain in the low back. He was also able to complete all exercises without pain in the low back, but was fatigued with clamshells. Continued work to help decreased myofascial restrictions around the R innominate and improve core strength will be beneficial.  Plan   Continue per POC. See above.       Goals    Goal 1:  Patient will demonstrate independence in prescribed HEP 1x/day with proper form, sets and reps for safe discharge to an independent program.   See below for current HEP:     Sessions:  6      Goal 2:  Pt will demonstrate increase in FOTO score by at least MDC to return to PLOF       FOTO score at IE = 42  MDC = 6     Sessions:  16      Goal 3:  Pt will demonstrate negative slump test to allow for community ambulation x 30 min with full wt acceptance R LE   Sessions:  16      Goal 4:  Pt will demo neutral stacked alignment in sitting in appropriate self selected seat height with use of supports as needed to allow sitting daily for ADL's without limitations   Sessions:  16          Goal 5:  Pt will report ability to sleep waking less than 2 times nightly to reposition using positioning techniques to increase restful sleep and ease OOB transition in AM   Sessions:  16                         Lawernce Ion, PT, DPT

## 2018-11-26 ENCOUNTER — Inpatient Hospital Stay: Payer: Medicare Other

## 2018-11-26 DIAGNOSIS — Z029 Encounter for administrative examinations, unspecified: Secondary | ICD-10-CM | POA: Insufficient documentation

## 2018-11-30 ENCOUNTER — Encounter (INDEPENDENT_AMBULATORY_CARE_PROVIDER_SITE_OTHER): Payer: Self-pay

## 2018-12-01 ENCOUNTER — Inpatient Hospital Stay: Payer: Medicare Other

## 2018-12-01 DIAGNOSIS — M5441 Lumbago with sciatica, right side: Secondary | ICD-10-CM

## 2018-12-01 DIAGNOSIS — M25551 Pain in right hip: Secondary | ICD-10-CM

## 2018-12-01 NOTE — PT/OT Therapy Note (Signed)
Name: Kent Jordan Age: 68 y.o.   Date of Service: 12/01/2018  Referring Physician: Raleigh Callas, MD   Date of Injury: 10/28/2018  Date Care Plan Established/Reviewed: 11/21/2018  Date Treatment Started: 11/21/2018  End of Certification Date: 02/18/2019  Sessions in Plan of Care: 16  Surgery Date: No data was found    Visit Count: 3   Diagnosis:   1. Hip pain, acute, right    2. Acute bilateral low back pain with right-sided sciatica        Subjective     Social Support/Occupation  Lives in: multiple level home  Lives with: spouse  Occupation: retired    I could barely walk this weekend, fell down once. I have been doing the clamshell and seated bracing   Pain going all the way down the ankle       Precautions: No data was found  Allergies: Angiotensin receptor blockers; Ciprofloxacin; Ace inhibitors; and Gabapentin                Treatment     Neuromuscular Re-Education   To facilitate posterior depressors via irradiation from LE (pt positioned in S/L with pelvis in slight AE and hip flexed to ~90 deg):  Traction to distal posterior/lateral thigh to facilitate hip stabilizers, then continued facilitation for overflow into trunk aiming for magnetic click.    End range hold R pelvic PD with addition of end range hold R scap AE for irradiation     Seated Weight acceptance training:  Pt sitting on pelvic floor at edge of plinth, staggered stance with front foot under knee:  Slow gradual traction to distal hamstring initiating hip rotators for hip stability  Then added increasing resistance to initiate quadriceps, followed by quad and glute co-contraction   Above repeated several time until pt immediately initiated proper quad/glute co-contraction needed for weight acceptance      Manual Therapy   S/L to improve pelvic mobility and ROM into R PD  Bony contours pelvic crest, lumbar spinal groove  STM QL, lumbar paraspinals, iliacus/abdom contents    Therapeutic Activity   sitting postural ed with ABA seat height  training to ID appropriate seat height, cues to rest feet on the floor, scoot forward in the seat   Manually facilitate pt to inhale while lifting up ribcage, then hinging pt forward on to pelvic floor   Verbal cues to have patient relax abdomen and tailbone to create base of support for ribcage, then facilitate relaxation of ribcage on to abdominal contents   Manual testing using EFT (elbow flexion test) to ensure efficient stacking          ---      ---   Total Time   Timed Minutes  43 minutes   Total Time  43 minutes        Assessment   Pt's R knee flex MMT improved from 4-/5 to 5-/5 and EFT painfree with good initiation with pelvic floor sitting. Painfree in std at end of session vs previously increased pain in R LE with weight shift on to R   Excellent quad initiation by end of session, very challenged with PD of pelvis   Plan   Review pelvic floor sitting  Progress wt accept to squatting as able   Re-assess MMT  Pelvic PF / wt accept       Goals    Goal 1:  Patient will demonstrate independence in prescribed HEP 1x/day with proper form, sets and reps  for safe discharge to an independent program.   See below for current HEP:    Seated bracing  Pelvic floor sitting  Unilateral pushing of R LE into floor for quad initiation      Sessions:  6      Goal 2:  Pt will demonstrate increase in FOTO score by at least MDC to return to PLOF       FOTO score at IE = 42  MDC = 6     Sessions:  16      Goal 3:  Pt will demonstrate negative slump test to allow for community ambulation x 30 min with full wt acceptance R LE   Sessions:  16      Goal 4:  Pt will demo neutral stacked alignment in sitting in appropriate self selected seat height with use of supports as needed to allow sitting daily for ADL's without limitations    Pelvic floor sitting addressed today  12/01/2018 MM   Sessions:  16   Progression:  progressing          Goal 5:  Pt will report ability to sleep waking less than 2 times nightly to reposition using  positioning techniques to increase restful sleep and ease OOB transition in AM   Sessions:  16                         Clem Wisenbaker Alford Highland, PT

## 2018-12-02 ENCOUNTER — Ambulatory Visit (INDEPENDENT_AMBULATORY_CARE_PROVIDER_SITE_OTHER): Payer: Self-pay | Admitting: Cardiovascular Disease

## 2018-12-03 ENCOUNTER — Inpatient Hospital Stay: Payer: Medicare Other

## 2018-12-04 ENCOUNTER — Inpatient Hospital Stay: Payer: Medicare Other

## 2018-12-04 DIAGNOSIS — M25551 Pain in right hip: Secondary | ICD-10-CM

## 2018-12-04 DIAGNOSIS — M5441 Lumbago with sciatica, right side: Secondary | ICD-10-CM

## 2018-12-04 NOTE — PT/OT Therapy Note (Signed)
Name: HENRIK ORIHUELA Age: 68 y.o.   Date of Service: 12/04/2018  Referring Physician: Raleigh Callas, MD   Date of Injury: 10/28/2018  Date Care Plan Established/Reviewed: 11/21/2018  Date Treatment Started: 11/21/2018  End of Certification Date: 02/18/2019  Sessions in Plan of Care: 16  Surgery Date: No data was found    Visit Count: 4   Diagnosis:   1. Hip pain, acute, right    2. Acute bilateral low back pain with right-sided sciatica        Subjective     Social Support/Occupation  Lives in: multiple level home  Lives with: spouse  Occupation: retired    I am feeling better walking better, working on my quad       Precautions: No data was found  Allergies: Angiotensin receptor blockers; Ciprofloxacin; Ace inhibitors; and Gabapentin    Objective   MMT R knee flex 4+/5             Treatment     Neuromuscular Re-Education   To facilitate posterior depressors via irradiation from LE (pt positioned in S/L with pelvis in slight AE and hip flexed to ~90 deg):  Traction to distal posterior/lateral thigh to facilitate hip stabilizers, then continued facilitation for overflow into trunk aiming for magnetic click. Progressed to COI of pelvis/hip as one unit, then progressed to dissociation of hip from pelvis    Pelvic posterior depression with end range holds to increase weight bearing into R LE to improve weight acceptance      Manual Therapy   STM HS in S/L with knee AROM  BC R IC, STM R QL    Therapeutic Activity   Review of  sitting postural ed with ABA seat height training to ID appropriate seat height, cues to rest feet on the floor, scoot forward in the seat   Manually facilitate pt to inhale while lifting up ribcage, then hinging pt forward on to pelvic floor   Verbal cues to have patient relax abdomen and tailbone to create base of support for ribcage, then facilitate relaxation of ribcage on to abdominal contents   Manual testing using EFT (elbow flexion test) to ensure efficient stacking     Seated Weight  acceptance training:  Pt sitting on pelvic floor at edge of plinth, staggered stance with front foot under knee:  Slow gradual traction to distal hamstring initiating hip rotators for hip stability  Then added increasing resistance to initiate quadriceps, followed by quad and glute co-contraction   Above repeated several time until pt immediately initiated proper quad/glute co-contraction needed for weight acceptance  Progressed to supporting patients body weight while focusing on relaxing paraspinals  Then re-initiated quad/glute co-contraction followed by pt supporting entire BW on single LE  Cuing pt to push self back up to pelvic floor using quad/glute co-contraction    Above repeated several times until pt able to weight accept into LE with appropriate core first strategy          ---      ---   Total Time   Timed Minutes  40 minutes   Total Time  40 minutes        Assessment   Pt able to isolate quad contraction without over activation of HS for first time in S/L today. Able to progress to seated hip hinge, cont to progress WBing over next few sessions.   Plan   Pelvic girdle  R PD - pelvic pattern then add LE then progress  to [facility]       Goals    Goal 1:  Patient will demonstrate independence in prescribed HEP 1x/day with proper form, sets and reps for safe discharge to an independent program.   See below for current HEP:    Seated bracing  Pelvic floor sitting  Unilateral pushing of R LE into floor for quad initiation      Sessions:  6      Goal 2:  Pt will demonstrate increase in FOTO score by at least MDC to return to PLOF       FOTO score at IE = 42  MDC = 6     Sessions:  16      Goal 3:  Pt will demonstrate negative slump test to allow for community ambulation x 30 min with full wt acceptance R LE   Sessions:  16      Goal 4:  Pt will demo neutral stacked alignment in sitting in appropriate self selected seat height with use of supports as needed to allow sitting daily for ADL's without  limitations    Pelvic floor sitting addressed today  12/01/2018 MM   Sessions:  16   Progression:  progressing          Goal 5:  Pt will report ability to sleep waking less than 2 times nightly to reposition using positioning techniques to increase restful sleep and ease OOB transition in AM   Sessions:  16                         Tzipora Mcinroy Alford Highland, PT

## 2018-12-08 ENCOUNTER — Inpatient Hospital Stay: Payer: Medicare Other

## 2018-12-08 ENCOUNTER — Encounter (INDEPENDENT_AMBULATORY_CARE_PROVIDER_SITE_OTHER): Payer: Self-pay | Admitting: Student in an Organized Health Care Education/Training Program

## 2018-12-10 ENCOUNTER — Inpatient Hospital Stay: Payer: Medicare Other

## 2018-12-15 ENCOUNTER — Inpatient Hospital Stay: Payer: Medicare Other

## 2018-12-16 ENCOUNTER — Encounter (INDEPENDENT_AMBULATORY_CARE_PROVIDER_SITE_OTHER): Payer: Self-pay

## 2018-12-17 ENCOUNTER — Inpatient Hospital Stay: Payer: Medicare Other

## 2018-12-22 ENCOUNTER — Inpatient Hospital Stay: Payer: Medicare Other

## 2018-12-24 ENCOUNTER — Inpatient Hospital Stay: Payer: Medicare Other

## 2018-12-26 ENCOUNTER — Inpatient Hospital Stay: Payer: Medicare Other

## 2018-12-29 ENCOUNTER — Inpatient Hospital Stay: Payer: Medicare Other

## 2018-12-31 ENCOUNTER — Encounter (INDEPENDENT_AMBULATORY_CARE_PROVIDER_SITE_OTHER): Payer: Self-pay

## 2019-01-01 ENCOUNTER — Inpatient Hospital Stay: Payer: Medicare Other

## 2019-01-06 ENCOUNTER — Telehealth: Payer: Self-pay

## 2019-01-07 ENCOUNTER — Inpatient Hospital Stay: Payer: Medicare Other

## 2019-01-14 ENCOUNTER — Inpatient Hospital Stay: Payer: Medicare Other

## 2019-01-16 ENCOUNTER — Encounter (INDEPENDENT_AMBULATORY_CARE_PROVIDER_SITE_OTHER): Payer: Self-pay | Admitting: Student in an Organized Health Care Education/Training Program

## 2019-01-23 ENCOUNTER — Telehealth: Payer: Self-pay

## 2019-02-18 ENCOUNTER — Telehealth (INDEPENDENT_AMBULATORY_CARE_PROVIDER_SITE_OTHER): Payer: Self-pay | Admitting: Student in an Organized Health Care Education/Training Program

## 2019-02-18 NOTE — Telephone Encounter (Signed)
Name, strength, directions of requested refill(s):    rosuvastatin (CRESTOR) 5 MG tablet (Order [id number])       Pharmacy to send refill to or patient to pick up rx from office (mark requested pharmacy in BOLD):    [facility]  [address],  [place], [place] [address]  Phone: [phone]      Please mark "X" next to the preferred call back number:    Mobile:   Telephone Information:   Mobile [phone]    x   Home: @HOMEPHONE @    Work: @WORKPHONE @          Next visit: Visit date not found

## 2019-02-18 NOTE — PT/OT Therapy Note (Signed)
Discontinuation of Therapy Services    Kent Jordan did not complete prescribed physical therapy visits.      Status is unknown at this time, physical therapy has been discontinued and patient has been discharged from care.  The last therapy note is below for review.    Please feel free to contact me with any questions regarding the care of [facility].    Sincerely,    Roosvelt Harps, [place] ZO#[id number]         02/18/2019

## 2019-02-23 ENCOUNTER — Other Ambulatory Visit (INDEPENDENT_AMBULATORY_CARE_PROVIDER_SITE_OTHER): Payer: Self-pay | Admitting: Student in an Organized Health Care Education/Training Program

## 2019-02-23 DIAGNOSIS — E782 Mixed hyperlipidemia: Secondary | ICD-10-CM

## 2019-02-23 DIAGNOSIS — R7989 Other specified abnormal findings of blood chemistry: Secondary | ICD-10-CM

## 2019-02-23 NOTE — Telephone Encounter (Signed)
Refill request sent to PCP for review.

## 2019-02-23 NOTE — Telephone Encounter (Signed)
LVM, patient had lab appointment to recheck liver function and cholesterol before continuing Crestor, fasting lab orders placed

## 2019-02-24 NOTE — Telephone Encounter (Signed)
Tried to call patient but no answer.   Left message that order is placed for blood work to check cholesterol prior to continuing with Crestor.   Dr. Arlester Marker also needs patient to schedule a telemedicine appt with to discuss results.

## 2019-02-25 ENCOUNTER — Other Ambulatory Visit (INDEPENDENT_AMBULATORY_CARE_PROVIDER_SITE_OTHER): Payer: Self-pay | Admitting: Student in an Organized Health Care Education/Training Program

## 2019-02-25 DIAGNOSIS — E119 Type 2 diabetes mellitus without complications: Secondary | ICD-10-CM

## 2019-02-26 ENCOUNTER — Other Ambulatory Visit (FREE_STANDING_LABORATORY_FACILITY): Payer: Medicare Other

## 2019-02-26 DIAGNOSIS — R7989 Other specified abnormal findings of blood chemistry: Secondary | ICD-10-CM

## 2019-02-26 DIAGNOSIS — R945 Abnormal results of liver function studies: Secondary | ICD-10-CM

## 2019-02-26 DIAGNOSIS — E782 Mixed hyperlipidemia: Secondary | ICD-10-CM

## 2019-02-26 LAB — COMPREHENSIVE METABOLIC PANEL
ALT: 124 U/L — ABNORMAL HIGH (ref 0–55)
AST (SGOT): 85 U/L — ABNORMAL HIGH (ref 5–34)
Albumin/Globulin Ratio: 1.4 (ref 0.9–2.2)
Albumin: 4.1 g/dL (ref 3.5–5.0)
Alkaline Phosphatase: 125 U/L — ABNORMAL HIGH (ref 38–106)
BUN: 12 mg/dL (ref 9.0–28.0)
Bilirubin, Total: 0.6 mg/dL (ref 0.2–1.2)
CO2: 25 mEq/L (ref 21–29)
Calcium: 9.7 mg/dL (ref 8.5–10.5)
Chloride: 101 mEq/L (ref 100–111)
Creatinine: 0.9 mg/dL (ref 0.5–1.5)
Globulin: 3 g/dL (ref 2.0–3.7)
Glucose: 213 mg/dL — ABNORMAL HIGH (ref 70–100)
Potassium: 4.4 mEq/L (ref 3.5–5.1)
Protein, Total: 7.1 g/dL (ref 6.0–8.3)
Sodium: 138 mEq/L (ref 136–145)

## 2019-02-26 LAB — LIPID PANEL
Cholesterol / HDL Ratio: 3.6
Cholesterol: 159 mg/dL (ref 0–199)
HDL: 44 mg/dL (ref 40–9999)
LDL Calculated: 87 mg/dL (ref 0–99)
Triglycerides: 140 mg/dL (ref 34–149)
VLDL Calculated: 28 mg/dL (ref 10–40)

## 2019-02-26 LAB — GFR: EGFR: >60

## 2019-02-26 LAB — HEMOLYSIS INDEX: Hemolysis Index: 5 (ref 0–18)

## 2019-03-03 ENCOUNTER — Telehealth (INDEPENDENT_AMBULATORY_CARE_PROVIDER_SITE_OTHER): Payer: Medicare Other | Admitting: Student in an Organized Health Care Education/Training Program

## 2019-03-03 ENCOUNTER — Inpatient Hospital Stay: Payer: Medicare Other | Attending: Physical Medicine & Rehabilitation

## 2019-03-03 ENCOUNTER — Encounter (INDEPENDENT_AMBULATORY_CARE_PROVIDER_SITE_OTHER): Payer: Self-pay | Admitting: Student in an Organized Health Care Education/Training Program

## 2019-03-03 DIAGNOSIS — M545 Low back pain, unspecified: Secondary | ICD-10-CM

## 2019-03-03 DIAGNOSIS — M5441 Lumbago with sciatica, right side: Secondary | ICD-10-CM | POA: Insufficient documentation

## 2019-03-03 DIAGNOSIS — E119 Type 2 diabetes mellitus without complications: Secondary | ICD-10-CM

## 2019-03-03 DIAGNOSIS — R7989 Other specified abnormal findings of blood chemistry: Secondary | ICD-10-CM

## 2019-03-03 DIAGNOSIS — G8929 Other chronic pain: Secondary | ICD-10-CM

## 2019-03-03 DIAGNOSIS — E782 Mixed hyperlipidemia: Secondary | ICD-10-CM

## 2019-03-03 MED ORDER — ROSUVASTATIN CALCIUM 5 MG PO TABS
5.0000 mg | ORAL_TABLET | Freq: Every day | ORAL | 1 refills | Status: DC
Start: 2019-03-03 — End: 2019-08-27

## 2019-03-03 NOTE — Progress Notes (Signed)
Have you seen any specialists/other providers since your last visit with [place]?    No    Arm preference verified?   No    The patient is due for mammogram, shingles vaccine, pneumonia vaccine and [facility], [facility]

## 2019-03-03 NOTE — Progress Notes (Signed)
TELEPHONE VISIT              CLINICAL SUMMARY        Verbal Consent      Verbal consent has been obtained from the patient to conduct a telephone visit encounter to minimize exposure to COVID-19: yes      Chief Complaint:      Chief Complaint   Patient presents with    Hypertension           Problem List, Medications, and Allergies reviewed: yes      HPI:    HPI    Problem   Elevated Lfts    Sees GI and hematology, h/o hemochromatosis, was on Zetia+Crestor, will stop Zetia, avoid EtOH        Lab Results   Component Value Date    ALT 124 (H) 02/26/2019    AST 85 (H) 02/26/2019    GGT 157 (H) 03/12/2018    ALKPHOS 125 (H) 02/26/2019    BILITOTAL 0.6 02/26/2019         Type 2 Diabetes Mellitus Without Complication, Without Long-Term Current Use of Insulin    Medication(s): Metformin 500mg  daily  Side effects: none  Hypoglycemic episodes: none  Neuropathic Sx: no, follows with podiatry  Eye exam in the past year: Jan 2019, no retinopathy, follows with Dr. Jon Billings  PNA vaccine:~2016  Last urine microalbumin: normal June 2018    Review of labs showed:  Lab Results   Component Value Date    HGBA1C 5.2 02/28/2018    HGBA1C 5.1 03/12/2017    HGBA1C 6.3 (H) 06/29/2016          Mixed Hyperlipidemia    Medication(s): Zetia 10mg  (stopped 03/03/19 d/t elevated LFT), Crestor 5mg   Side effects: Crestor causes rash at higher doses  49yr CVD risk:  DM2, HTN, former smoker; statin benefit       Denies CP/dyspnea/dizziness/HA/orthopnea/edema   Counseled on diet and exercise    Review of lipid panel shows:  Lab Results   Component Value Date    CHOL 159 02/26/2019    CHOL 243 (H) 08/18/2018    CHOL 153 02/28/2018     Lab Results   Component Value Date    TRIG 140 02/26/2019    TRIG 167 (H) 08/18/2018    TRIG 95 02/28/2018      Lab Results   Component Value Date    HDL 44 02/26/2019    HDL 45 08/18/2018    HDL 55 02/28/2018     Lab Results   Component Value Date    LDL 87 02/26/2019    LDL 165 (H) 08/18/2018    LDL 79 02/28/2018                 Assessment/Plan:      1. Mixed hyperlipidemia  rosuvastatin (CRESTOR) 5 MG tablet   2. Type 2 diabetes mellitus without complication, without long-term current use of insulin     3. Elevated LFTs     4. Other hemochromatosis     5. Chronic bilateral low back pain, unspecified whether sciatica present  Ambulatory referral to Physical Therapy     1. Mixed hyperlipidemia  - Goal LDL < 100, controlled, statin benefit, cont statin, stop zetia d/t elevate LFTs, no sfx, encourage diet and exercise   - rosuvastatin (CRESTOR) 5 MG tablet; Take 1 tablet (5 mg total) by mouth daily  Dispense: 90 tablet; Refill: 1    2. Type 2 diabetes mellitus without  complication, without long-term current use of insulin  - Goal A1c < 7, good control, encourage diabetic diet and regular exercise, continue current regimen     3. Elevated LFTs  Stop zetia, avoid hepatotoxins, f/u with GI     4. Other hemochromatosis  Has f/u with hematology    5. Chronic bilateral low back pain, unspecified whether sciatica present  - Ambulatory referral to Physical Therapy     Time spent in medical discussion: 11 to 20 minutes    Erasmo Score, MD

## 2019-03-03 NOTE — Progress Notes (Signed)
Name:Nikolaus J Longest Age: 68 y.o.   Date of Service: 03/03/2019  Referring Physician: Raleigh Callas, MD   Date of Injury: 03/03/2019  Date Care Plan Established/Reviewed: 03/03/2019  Date Treatment Started: 03/03/2019  End of Certification Date: 05/31/2019  Sessions in Plan of Care: 16  Surgery Date: No data was found      Visit Count: 1   Diagnosis:   1. Acute bilateral low back pain with right-sided sciatica        Subjective     History of Present Illness   History of Present Illness: I am having problems with my sciatica shooting pain from R buttock into R ankle. It is starting to move to the left side as well. Pt uses SPC on and off.     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    Functional Limitations (PLOF): Marland Kitchen  Sleeping - wakes him up every night  Difficult getting out of the bed and straightening up   Walking, legs feel heavy, walks dog 25 min with increasing pain  Standing still > 5 min, has to sit done while cooking dinner   Sitting causes occasional shooting pain but not bad overall  Standing up from sitting / squatting  Difficulty dressing himself (socks, shoes, pants) has to do it in sitting with difficulty   Poor balance esp if having to turn left too quickly, falls 2-3 times a year     Outcome Measure   Tool Used/Details: FOTO  Score: 45  Predicted Functional Outcome: 54    Pain   Current pain rating: 5  At best pain rating: 1  At worst pain rating: 8  Shooting pain into R LE, tingling    Social Support/Occupation  Lives in: multiple level home  Lives with: spouse  Occupation: retired      Precautions: No data was found  Allergies: Angiotensin receptor blockers; Ciprofloxacin; Ace inhibitors; and Gabapentin    Past Medical History:   Diagnosis Date    Arrhythmia     when on ARB    Arthritis     psoriatic and  osteoarthritis     Back pain     Blood disorder 11/2014    Hemochromatosis    Diabetes mellitus 2016    started metformin 05/2016 d/t A1C 7.1 - now checking BS 2x/day - average 70-110 in the last week - A1C on 06/29/16 = 6.3 , has also lost 16 lbs since 05/2016    Difficulty in walking(719.7)     Uses cane to ambulate    Elevated liver function tests     had abd u/s 07/05/16     Failed back syndrome     Fatty liver     Hemochromatosis     phlebotomy every 3 months - last done 02/2016    High cholesterol     Hypertensive disorder 06/2016    norvasc recently increased to 10 mg daily - now controlled on meds - most recent BP 117/73    Low back pain     Lung nodule 2012    benign - was monitored x  3 yrs by Dr. Bernita Raisin (Pulm) - signed off from pulmonology - no further f/u required since 2015    Neuropathy     Tingling left foot, numbness left toes    Psoriasis  Psoriatic arthritis     tx w/ enbrel    S/P insertion of spinal cord stimulator 2015    Sleep apnea     mild - does not have CPAP - was unable to tolerate, less snoring since turboplasty       Objective   L/S AROM  - not assessed due to difficulty balancing in standing in spite of wide BOS    Range of Motion        Left AROM    Left PROM   Lumbar/Hip    Right AROM    Right PROM       Lumbar Rotation         90  Hip Flexion           Hip Extension           Hip Abduction           Hip Adduction         0  Hip IR   -10      25  Hip ER   45    (blank fields were intentionally left blank)          Strength        Left Strength  Hip      Right   4  Hip Flexion 3 pain     Hip Extension       Hip Abduction       Hip Adduction     5  Hip IR 5    5  Hip ER 5    5  Quadriceps 5    4+  Hamstrings 4+    (blank fields were intentionally left blank)       Left Strength  Ankle/Foot      Right   4+  Ankle Dorsiflexion 4    5  Ankle Plantarflexion 5      Ankle Inversion       Ankle Eversion     (blank fields were intentionally left blank)             Treatment      Therapeutic Exercises   Justification: To improve strength/endurance/ROM as below with use of verbal/tactile cues by PT     Pt edu re: exam findings with pertinent anatomy with help of model/pictures, role of PT, POC    Review/update of HEP, all of below with verbal/tactile cues:  - seated bracing 30 sex 10x/day     Neuromuscular Re-Education   Justification: To improve initiation of appropriate muscles with efficient timing using manual facilitation by PT     To increase core initiation to improve pelvic girdle/core stability  (Pt positioned in S/L top hip flexed to ~90 deg with ankle at end range DF)  Isomerically resisted LE flex/add/IR for irradiation into trunk, adding resistance to pelvic AE, prolonged hold aiming for magnetic click, followed by small range COI for resisted AE with LE flex/add/IR first resisting pelvis and hip as one unit, then dissociating LE from pelvis             ---      ---   Total Time   Timed Minutes  25 minutes   Untimed Minutes  20 minutes   Total Time  45 minutes        Assessment   Monterius is a 67 y.o. male presenting with R hip pain who requires Physical Therapy for the following:  Impairments:   IPTC Impairments: Pain that limits and  interferes with functional ability  Impaired postural alignment  Decreased range of motion  Decreased strength  Decreased functional stability  Decreased joint mobility  Decreased soft tissue mobility  Decreased/impaired motor control  Neural tension  Decreased coordination  Decreased static balance  Decreased dynamic balance    Pain located: R Hip    Clinical presentation: unstable - pain that occurs without a predictable pattern or stimulus  Barriers to therapy: Time since onset of injury/illness/exacerbation - resulting in multi-joint involvement  Past surgical history - past surgery to this area without symptom improvement      Functional Limitations (PLOF): Marland Kitchen  Sleeping - wakes him up every night  Difficult getting out of the bed and  straightening up   Walking, legs feel heavy, walks dog 25 min with increasing pain  Standing still > 5 min, has to sit done while cooking dinner   Sitting causes occasional shooting pain but not bad overall  Standing up from sitting / squatting  Difficulty dressing himself (socks, shoes, pants) has to do it in sitting with difficulty   Poor balance esp if having to turn left too quickly, falls 2-3 times a year   Prognosis: excellent  Plan   Visits per week: 2  Number of Sessions: 16  Direct One on One  16109: Therapeutic Exercise: To Develop Strength and Endurance, ROM and Flexibility  [id number]: Gait Training  60454: Neuromuscular Reeducation (Proprioceptive Neuromuscular Faciliation)  97140: Manual Therapy techniques (mobilization, manipulation, manual traction) (Grade I-V to hip, pelvic girdle, and regionally interdependent joints, soft tissue mobilization, instrument assisted soft tissue mobilization.)  97530: Therapeutic Activities: Dynamic activities to improve functional performance  Dry Needling  Supervised Modalities  97010: Thermal modalities: hot/cold packs  09811: Mechnical traction  97014: Electrical stimulation  97016: Vasopneumatic devices    Goals    Goal 1:  .  Pt will demonstrate painfree hip flex MMT 5/5 bilaterally to allow for painfree community ambulation x 30 min    Sessions:  16      Goal 2:  .  Pt will report ability to sleep waking once nightly or less to reposition using positioning techniques to increase restful sleep and ease OOB transition in AM     Sessions:  6      Goal 3:  .  Pt will demonstrate SLS x 10 sec bilaterally on level/unlevel surfaces to decrease fall risk     Sessions:  16      Goal 4:  .  Patient will demonstrate independence in prescribed HEP 1x/day with proper form, sets and reps for safe discharge to an independent program.   See below for current HEP:  - seated bracing 30 sex 10x/day      Sessions:  16          Goal 5:  .  Pt will demonstrate increase in FOTO score by at  least MDC to return to PLOF       FOTO score at IE = 45  MDC = 6  predictaed score at visit #13 = 54   Sessions:  16                         Mahayla Haddaway Alford Highland, PT

## 2019-03-05 ENCOUNTER — Inpatient Hospital Stay: Payer: Medicare Other

## 2019-03-05 DIAGNOSIS — M5441 Lumbago with sciatica, right side: Secondary | ICD-10-CM

## 2019-03-05 NOTE — PT/OT Therapy Note (Signed)
Name: Kent Jordan Age: 68 y.o.   Date of Service: 03/05/2019  Referring Physician: Raleigh Callas, MD   Date of Injury: 03/03/2019  Date Care Plan Established/Reviewed: 03/03/2019  Date Treatment Started: 03/03/2019  End of Certification Date: 05/31/2019  Sessions in Plan of Care: 16  Surgery Date: No data was found    Visit Count: 2   Diagnosis:   1. Acute bilateral low back pain with right-sided sciatica        Subjective     Social Support/Occupation  Lives in: multiple level home  Lives with: spouse  Occupation: retired    I have been busy taking care of my wife who broke her wrist. Been doing laundry and taking her to the ER and to the doctor.   I have done my exercise to the point where I am a little sore in my abs. I was sore after last session but used       Precautions: No data was found  Allergies: Angiotensin receptor blockers; Ciprofloxacin; Ace inhibitors; and Gabapentin    Objective   R hip flex MMT 4+/5 p! vs 3/5 p! at IE             Treatment     Therapeutic Exercises   Justification: To improve strength/endurance/ROM as below with use of verbal/tactile cues by PT     Review/update of HEP, all of below with verbal/tactile cues:  - Seated bracing   - pelvic clock 12.30 - 1.30 for R pelvic AE initiation     Billed with MT    Neuromuscular Re-Education   Justification: .[id number]    To increase core initiation to improve pelvic girdle/core stability  (Pt positioned in S/L top hip flexed to ~90 deg with ankle at end range DF)  Isomerically resisted LE flex/add/IR for irradiation into trunk, adding resistance to pelvic AE, prolonged hold aiming for magnetic click  -- pt unable to tolerate this due to increased pain.    Rhythmic initiation pelvic AE/PD Pelvic AE with end range holds and COI with LE flex/ adduction/ IR/ DF/ Inv to improve neuromuscular firing to improve stepping ability    Manual Therapy   S/L to improve pelvic mobility and ROM into AE/PD R pelvis   GENTLE SF and Bony contours pelvic crest,  inferior ribcage, lumbar spinal groove  STM QL, lumbar paraspinals, iliacus/abdom contents         ---      ---   Total Time   Timed Minutes  40 minutes   Total Time  40 minutes        Assessment   Pt with improved mobility into R pelvic AE/PD and improving NM control over this motion as well as improved core strength per hip flex MMT today compared to IT.   Plan   Review sleeping       Goals    Goal 1:  .  Pt will demonstrate painfree hip flex MMT 5/5 bilaterally to allow for painfree community ambulation x 30 min     R hip flex MMT 4+/5 p! vs 3/5 p! at IE  03/05/2019 MM   Sessions:  16   Progression:  progressing      Goal 2:  .  Pt will report ability to sleep waking once nightly or less to reposition using positioning techniques to increase restful sleep and ease OOB transition in AM     Sessions:  6      Goal 3:  .  Pt will demonstrate SLS x 10 sec bilaterally on level/unlevel surfaces to decrease fall risk     Sessions:  16      Goal 4:  .  Patient will demonstrate independence in prescribed HEP 1x/day with proper form, sets and reps for safe discharge to an independent program.   See below for current HEP:  - seated bracing 30 sex 10x/day   - pelvic clock 12.30 - 1.30 for R pelvic AE initiation     Sessions:  16   Progression:  progressing          Goal 5:  .  Pt will demonstrate increase in FOTO score by at least MDC to return to PLOF       FOTO score at IE = 45  MDC = 6  predictaed score at visit #13 = 54   Sessions:  16                         Mayford Alberg Alford Highland, PT

## 2019-03-12 ENCOUNTER — Telehealth (INDEPENDENT_AMBULATORY_CARE_PROVIDER_SITE_OTHER): Payer: Medicare Other | Admitting: "Endocrinology

## 2019-03-18 ENCOUNTER — Inpatient Hospital Stay: Payer: Medicare Other

## 2019-03-18 DIAGNOSIS — M5441 Lumbago with sciatica, right side: Secondary | ICD-10-CM

## 2019-03-18 NOTE — PT/OT Therapy Note (Signed)
Name: Kent Jordan Age: 68 y.o.   Date of Service: 03/18/2019  Referring Physician: Raleigh Callas, MD   Date of Injury: 03/03/2019  Date Care Plan Established/Reviewed: 03/03/2019  Date Treatment Started: 03/03/2019  End of Certification Date: 05/31/2019  Sessions in Plan of Care: 16  Surgery Date: No data was found    Visit Count: 3   Diagnosis:   1. Acute bilateral low back pain with right-sided sciatica        Subjective     Social Support/Occupation  Lives in: multiple level home  Lives with: spouse  Occupation: retired    I am doing so much better. I have been able to ride my bike for about 5-10 min and slowly going to start increasing. Still having some pin in the back and leg but much better.   Not sleeping well but it is not the pain.       Precautions: No data was found  Allergies: Angiotensin receptor blockers; Ciprofloxacin; Ace inhibitors; and Gabapentin    Objective   Gait - hard heel strike R>L             Treatment     Therapeutic Exercises   Review/update of HEP, all of below with verbal/tactile cues:  - supine bridges   Billed with NMR    Neuromuscular Re-Education   Justification: To improve initiation of appropriate muscles with efficient timing using manual facilitation by PT     R Pelvic AE with end range holds and COI with LE flex/ adduction/ IR/ DF/ Inv to improve neuromuscular firing to improve stepping ability    S/L - to improve lengthening of R QL to improve heel strike control with gait:  Isometric resistance to pelvic PE, progressing to maintained isotonic, then eccentrics lengthening followed by MI, followed by small range concentric, followed by MI, followed by eccentric lengthening into endrange AD  Then PH at end range AD followed by COI     To facilitate posterior depressors via irradiation from LE (pt positioned in S/L with pelvis in slight AE and hip flexed to ~90 deg):  Traction to distal posterior/lateral thigh to facilitate hip stabilizers, then continued facilitation for  overflow into trunk aiming for magnetic click. Progressed to COI of pelvis/hip as one unit    Manual Therapy   S/L to improve pelvic mobility and ROM into R pelvic PD  Bony contours pelvic crest, inferior ribcage, lumbar spinal groove  STM QL, lumbar paraspinals, iliacus/abdom contents  R SIJ gapping with R hip IR AROM  R to L mob coccyx with R knee ext       ---      ---   Total Time   Timed Minutes  40 minutes   Total Time  40 minutes        Assessment   Pt with sig improved mobility R pelvic and very challenged with all of NMR above. Pt fatigued at end of session but softer heel strike with gait at end of session. Overall pt's symptoms less irritable today and able to better tolerate NMR above which he was unable to tolerate last session. Updated HEP as above   Plan   Re-assess gait   Seated wt accept       Goals    Goal 1:  .  Pt will demonstrate painfree hip flex MMT 5/5 bilaterally to allow for painfree community ambulation x 30 min     R hip flex MMT 4+/5 p! vs 3/5  p! at IE  03/05/2019 MM   Sessions:  16   Progression:  progressing      Goal 2:  .  Pt will report ability to sleep waking once nightly or less to reposition using positioning techniques to increase restful sleep and ease OOB transition in AM     Sessions:  6      Goal 3:  .  Pt will demonstrate SLS x 10 sec bilaterally on level/unlevel surfaces to decrease fall risk     Sessions:  16      Goal 4:  .  Patient will demonstrate independence in prescribed HEP 1x/day with proper form, sets and reps for safe discharge to an independent program.   See below for current HEP:  - seated bracing 30 sex 10x/day   - supine bridges  03/18/2019 MM     Sessions:  16   Progression:  progressing          Goal 5:  .  Pt will demonstrate increase in FOTO score by at least MDC to return to PLOF       FOTO score at IE = 45  MDC = 6  predictaed score at visit #13 = 54   Sessions:  16                         Kent Jordan, PT

## 2019-03-20 ENCOUNTER — Inpatient Hospital Stay: Payer: Medicare Other

## 2019-03-20 ENCOUNTER — Emergency Department: Payer: Medicare Other

## 2019-03-20 ENCOUNTER — Emergency Department
Admission: EM | Admit: 2019-03-20 | Discharge: 2019-03-20 | Disposition: A | Discharge status: Home / Self Care | Payer: Medicare Other | Attending: Emergency Medicine | Admitting: Emergency Medicine

## 2019-03-20 DIAGNOSIS — Z7982 Long term (current) use of aspirin: Secondary | ICD-10-CM | POA: Insufficient documentation

## 2019-03-20 DIAGNOSIS — Y93K1 Activity, walking an animal: Secondary | ICD-10-CM | POA: Insufficient documentation

## 2019-03-20 DIAGNOSIS — E1140 Type 2 diabetes mellitus with diabetic neuropathy, unspecified: Secondary | ICD-10-CM | POA: Insufficient documentation

## 2019-03-20 DIAGNOSIS — X501XXA Overexertion from prolonged static or awkward postures, initial encounter: Secondary | ICD-10-CM | POA: Insufficient documentation

## 2019-03-20 DIAGNOSIS — R030 Elevated blood-pressure reading, without diagnosis of hypertension: Secondary | ICD-10-CM

## 2019-03-20 DIAGNOSIS — R262 Difficulty in walking, not elsewhere classified: Secondary | ICD-10-CM | POA: Insufficient documentation

## 2019-03-20 DIAGNOSIS — I10 Essential (primary) hypertension: Secondary | ICD-10-CM | POA: Insufficient documentation

## 2019-03-20 DIAGNOSIS — Z7984 Long term (current) use of oral hypoglycemic drugs: Secondary | ICD-10-CM | POA: Insufficient documentation

## 2019-03-20 DIAGNOSIS — M5441 Lumbago with sciatica, right side: Secondary | ICD-10-CM

## 2019-03-20 DIAGNOSIS — S79912A Unspecified injury of left hip, initial encounter: Secondary | ICD-10-CM | POA: Insufficient documentation

## 2019-03-20 DIAGNOSIS — E7800 Pure hypercholesterolemia, unspecified: Secondary | ICD-10-CM | POA: Insufficient documentation

## 2019-03-20 DIAGNOSIS — Z87891 Personal history of nicotine dependence: Secondary | ICD-10-CM | POA: Insufficient documentation

## 2019-03-20 MED ORDER — LIDOCAINE 5 % EX PTCH
1.00 | MEDICATED_PATCH | Freq: Two times a day (BID) | CUTANEOUS | 0 refills | Status: DC
Start: 2019-03-20 — End: 2019-07-17

## 2019-03-20 MED ORDER — ACETAMINOPHEN 500 MG PO TABS
1000.0000 mg | ORAL_TABLET | Freq: Once | ORAL | Status: AC
Start: 2019-03-20 — End: 2019-03-20
  Administered 2019-03-20: 10:00:00 1000 mg via ORAL
  Filled 2019-03-20: qty 2

## 2019-03-20 NOTE — ED Provider Notes (Addendum)
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 male presenting to the ED with L hip pain after injury this morning when walking his dog and tried to steer dog away from dead frog. Felt he twisted his hip. Has pain with standing and walking. tyoically uses cane to walk and holds in R hand as he has chronic R sided sciatica but today used in L side to keep weight off L hip. Went to PT and sent down to ED for hip xr. Denies leg numbness weakness on LLE. Denies abd pain or new back pain.      PCP: Erasmo Score, MD  SPECIALISTS:    No current facility-administered medications for this encounter.      Current Outpatient Medications   Medication Sig Dispense Refill    amLODIPine (NORVASC) 10 MG tablet Take 10 mg by mouth daily         Secukinumab, 300 MG Dose, (Cosentyx, 300 MG Dose,) 150 MG/ML Solution Prefilled Syringe Inject into the skin      aspirin 81 MG tablet Take 81 mg by mouth daily.      celecoxib (CeleBREX) 200 MG capsule Take 200 mg by mouth 2 (two) times daily      ciclopirox (PENLAC) 8 % solution Apply over nail and surrounding skin. Apply nightly over previous coat. After seven (7) days, may remove with alcohol and continue cycle. 6.6 mL 1    Cosentyx Sensoready, 300 MG, 150 MG/ML Solution Auto-injector       DULoxetine (CYMBALTA) 60 MG capsule Take 60 mg by mouth daily.      ENBREL SURECLICK 50 MG/ML Solution Auto-injector Inject as directed once a week.Thursdays          folic acid (FOLVITE) 400 MCG tablet  Take 400 mcg by mouth daily.      FREESTYLE LITE test strip 1 each by Other route 2 (two) times daily. 100 each 2    hydroCHLOROthiazide (HYDRODIURIL) 25 MG tablet Take 25 mg by mouth daily.         ibuprofen (ADVIL,MOTRIN) 600 MG tablet Take 1 tablet (600 mg total) by mouth every 8 (eight) hours as needed for Pain. 30 tablet 0    Lancets (FREESTYLE) lancets 1 each by Other route 2 (two) times daily. 100 each 11    lidocaine (LIDODERM) 5 % Place 1 patch onto the skin every 12 (twelve) hours Remove & Discard patch within 12 hours or as directed by MD 6 patch 0    metFORMIN (GLUCOPHAGE-XR) 500 MG 24 hr tablet Take 1 tablet (500 mg total) by mouth every morning with breakfast 90 tablet 0    metoprolol succinate XL (TOPROL-XL) 25 MG 24 hr tablet Take 25 mg by mouth daily     3    rosuvastatin (CRESTOR) 5 MG tablet Take 1 tablet (5 mg total) by mouth daily 90 tablet 1  traZODone (DESYREL) 50 MG tablet Take 50 mg by mouth nightly as needed.             Past History     Past Medical History:  Past Medical History:   Diagnosis Date    Arrhythmia     when on ARB    Arthritis     psoriatic and osteoarthritis     Back pain     Blood disorder 11/2014    Hemochromatosis    Diabetes mellitus 2016    started metformin 05/2016 d/t A1C 7.1 - now checking BS 2x/day - average 70-110 in the last week - A1C on 06/29/16 = 6.3 , has also lost 16 lbs since 05/2016    Difficulty in walking(719.7)     Uses cane to ambulate    Elevated liver function tests     had abd u/s 07/05/16     Failed back syndrome     Fatty liver     Hemochromatosis     phlebotomy every 3 months - last done 02/2016    High cholesterol     Hypertensive disorder 06/2016    norvasc recently increased to 10 mg daily - now controlled on meds - most recent BP 117/73    Low back pain     Lung nodule 2012    benign - was monitored x  3 yrs by Dr. Bernita Raisin (Pulm) - signed off from pulmonology - no further f/u required since 2015    Neuropathy     Tingling  left foot, numbness left toes    Psoriasis     Psoriatic arthritis     tx w/ enbrel    S/P insertion of spinal cord stimulator 2015    Sleep apnea     mild - does not have CPAP - was unable to tolerate, less snoring since turboplasty       Past Surgical History:  Past Surgical History:   Procedure Laterality Date    ABDOMINAL SURGERY  03/1971    double hernia repair    ARTHROPLASTY, KNEE, TOTAL Right 08/20/2016    Procedure: ARTHROPLASTY, KNEE, TOTAL;  Surgeon: Harriet Masson, MD;  Location: [facility];  Service: Orthopedics;  Laterality: Right;  RIGHT TOTAL KNEE REPLACEMENT    ARTHROSCOPY, KNEE Right 03/21/2016    Procedure: ARTHROSCOPY, KNEE, RIGHT, PARTIAL MEDIAL MENISCECTOMY, PARTIAL SYNOVECTOMY;  Surgeon: Verner Chol, MD;  Location: [facility];  Service: Orthopedics;  Laterality: Right;    CATARACT EXT.WITH IOL Bilateral 2009    FRACTURE SURGERY  1993    right middle finger    INSERTION, SPINAL CORD STIMULATOR GENERATOR N/A 05/07/2014    Procedure: DORSAL COLUMN STIMULATOR PLACEMENT;  Surgeon: Tera Helper, MD;  Location: [facility];  Service: Neurosurgery;  Laterality: N/A;  DCS PLACEMENT    KNEE ARTHROCENTESIS  1983    right    LUMBAR FUSION  10/2011    MICRODISCECTOMY LUMBAR  06/2011, 07/2011    SEPTOPLASTY  10/05/2015    SKIN BIOPSY  11/2015    Negative       Family History:  Family History   Problem Relation Age of Onset    Heart disease Mother     Heart disease Father        Social History:  Social History     Tobacco Use    Smoking status: Former Smoker     Packs/day: 1.00     Years: 41.00     Pack years: 41.00  Last attempt to quit: 09/26/2011     Years since quitting: 7.4    Smokeless tobacco: Never Used   Substance Use Topics    Alcohol use: Yes     Alcohol/week: 2.0 standard drinks     Types: 4 Cans of beer per week    Drug use: No       Allergies:  Allergies   Allergen Reactions    Angiotensin Receptor Blockers Other (See Comments)     ARB combined with  Celebrex caused Arrhythmia due to high K+    Ciprofloxacin Swelling and Anaphylaxis     swelling    Ace Inhibitors Angioedema    Gabapentin Other (See Comments)     blurred vision, dizziness       Review of Systems     Review of Systems   Respiratory: Negative for shortness of breath.    Cardiovascular: Negative for chest pain.   Gastrointestinal: Negative for abdominal pain.   Musculoskeletal: Positive for arthralgias and gait problem. Negative for joint swelling.        No new back pain   Skin: Negative for color change, rash and wound.   Neurological: Negative for weakness and numbness.       Physical Exam   BP 198/84    Pulse 66    Temp 98.6 F (37 C) (Oral)    Resp 18    Ht 5\' 11"  (1.803 m)    Wt 94.3 kg    SpO2 97%    BMI 29.01 kg/m   Physical Exam   Constitutional: Patient is oriented to person, place, and time and well-developed, well-nourished, and in no distress.   Head: Normocephalic and atraumatic.   Eyes: EOM are normal. Pupils are equal, round, and reactive to light. pink subconj. No scleral icterus  ENT: OP clear, MMM  Neck: Normal range of motion. Neck supple. No JVD  Cardiovascular: Normal rate and regular rhythm. No murmurs or rubs.  Pulmonary/Chest: Effort normal and breath sounds normal. No respiratory distress.   Abdominal: Soft. There is no tenderness. No rebound or guarding.  Musculoskeletal: Normal range of motion LLE but has pain with log roll. No swelling or deformities L hip. Pelvis stable to rocking. No lower extremity edema.  Neurological: Patient is alert and oriented to person, place, and time. GCS score is 15. Intact sensation to light touch lle  Skin: Skin is warm and dry.         Diagnostic Study Results     Labs -     Results     ** No results found for the last 24 hours. **          Radiologic Studies -   Radiology Results (24 Hour)     Procedure Component Value Units Date/Time    Hip Left AP and Lateral With Pelvis [[id number]] Collected:  03/20/19 1035    Order Status:   Completed Updated:  03/20/19 1041    Narrative:       INDICATION: Left hip pain.    TECHNIQUE: 2 views.    FINDINGS: No acute fracture or dislocation involving the pelvis and left  hip is demonstrated. There is mild narrowing of the left right hip joint  spaces. Degenerative changes mostly aspect of the acetabula bilaterally.  There are bilateral pedicle screws and rods in the lower lumbosacral  spine. There are spacers within the L4-5 and L5-S1 intervertebral disc  spaces. An electronic device suggestive of a TENS unit overlies the left  hip. Its leads extend superiorly.      Impression:        Degenerative changes. No acute fracture or dislocation is  demonstrated.    Merri Ray, MD   03/20/2019 10:39 AM      .    Medical Decision Making   I am the first provider for this patient.    I reviewed the vital signs, available nursing notes, past medical history, past surgical history, family history and social history.    Vital Signs-Reviewed the patient's vital signs.     Patient Vitals for the past 12 hrs:   BP Temp Pulse Resp   03/20/19 0948 198/84 98.6 F (37 C) 66 18       Pulse Oximetry Analysis - Normal 97% on RA      Old Medical Records: Old medical records. Seen by PT 03/18/19 for bilateral lbp with R sciatica/R hip pain    ED Course:   10:05- no meds taken today for pain. Declines narcotics in ED in case he needs to drive home. Ok with tylenol.      10:45a- discussed xr results with pt as well as limitations and getting ct scan if pain with bearing weight to eval for fx not seen on xr. He declines. States he would not have even come for xr on his own but was encouraged by PT when he went to PT today. He'd like to wait and see if pain improves with conservative measures. He states he has tramadol, hydrocodone and flexeril at home. Also sees Dr. Cherly Hensen (ortho) as outpt.    bp elevated without si/sx hypertensive emergency. He states elevated due to pain.     Offered walker but declines.    The patient  verbalizes understanding of the test results, short and long term treatment plan, indications to return to emergency department, need for immediate follow up, and possible medication side effects. The patient agrees with discharge home at this time.    Provider Notes (Initial Assessment): L hip injury without fall pta. Nvi. Will check xr to eval for fxProv           Diagnosis     Clinical Impression:   1. Hip injury, left, initial encounter    2. Elevated blood pressure reading        Treatment Plan:   ED Disposition     ED Disposition Condition Date/Time Comment    Discharge  Fri Mar 20, 2019 10:46 AM Oretha Milch [place]a discharge to home/self care.    Condition at disposition: Stable            _______________________________  Attestations:     None        I am the first provider for this patient.    Marquette Old, MD is the primary emergency doctor of record.    I reviewed the vital signs, available nursing notes, past medical history, past surgical history, family history and social history.       Marquette Old, MD  03/20/19 1048       Marquette Old, MD  03/20/19 1049

## 2019-03-20 NOTE — ED Notes (Signed)
Personal Protective Equipment (PPE)    Face Shield, Gloves, Goggles, N95, Shoe Covers and mask      Patient wearing mask while in room.

## 2019-03-20 NOTE — ED Triage Notes (Signed)
See quick triage note.

## 2019-03-20 NOTE — PT/OT Therapy Note (Signed)
Name: Kent Jordan Age: 68 y.o.   Date of Service: 03/20/2019  Referring Physician: Raleigh Callas, MD   Date of Injury: 03/03/2019  Date Care Plan Established/Reviewed: 03/03/2019  Date Treatment Started: 03/03/2019  End of Certification Date: 05/31/2019  Sessions in Plan of Care: 16  Surgery Date: No data was found    Visit Count: 4   Diagnosis:   1. Acute bilateral low back pain with right-sided sciatica        Subjective     Social Support/Occupation  Lives in: multiple level home  Lives with: spouse  Occupation: retired    I fell off the path walking the dog and my left hip went out under me. Could not WB initially but now a little better still hurts though to put weight on the leg       Precautions: No data was found  Allergies: Angiotensin receptor blockers; Ciprofloxacin; Ace inhibitors; and Gabapentin    Objective   (+) compression vibration test on L ischial tuberosity  (+) tuning fork test L greater troch     Billed with TE             Treatment     Therapeutic Exercises   Justification: Manage case   Pt edu re possibility of fx L pelvis/femur with (+) tests above       ---      ---   Total Time   Timed Minutes  15 minutes   Total Time  15 minutes        Assessment   Pt with (+) tests above and with difficulty ambulating today avoiding WB on to L LE with use of cane in R UE. Edcuted pt on sirk of fx and took pt to ER downstairs with wheelchair.   Plan   Cont treatment next week if indicated       Goals    Goal 1:  .  Pt will demonstrate painfree hip flex MMT 5/5 bilaterally to allow for painfree community ambulation x 30 min     R hip flex MMT 4+/5 p! vs 3/5 p! at IE  03/05/2019 MM   Sessions:  16   Progression:  progressing      Goal 2:  .  Pt will report ability to sleep waking once nightly or less to reposition using positioning techniques to increase restful sleep and ease OOB transition in AM     Sessions:  6      Goal 3:  .  Pt will demonstrate SLS x 10 sec bilaterally on level/unlevel surfaces to  decrease fall risk     Sessions:  16      Goal 4:  .  Patient will demonstrate independence in prescribed HEP 1x/day with proper form, sets and reps for safe discharge to an independent program.   See below for current HEP:  - seated bracing 30 sex 10x/day   - supine bridges  03/18/2019 MM     Sessions:  16   Progression:  progressing          Goal 5:  .  Pt will demonstrate increase in FOTO score by at least MDC to return to PLOF       FOTO score at IE = 45  MDC = 6  predictaed score at visit #13 = 54   Sessions:  16  Burman Freestone, PT

## 2019-03-23 ENCOUNTER — Inpatient Hospital Stay: Payer: Medicare Other

## 2019-03-24 ENCOUNTER — Encounter (INDEPENDENT_AMBULATORY_CARE_PROVIDER_SITE_OTHER): Payer: Self-pay

## 2019-03-25 ENCOUNTER — Inpatient Hospital Stay: Payer: Medicare Other

## 2019-03-25 ENCOUNTER — Ambulatory Visit (INDEPENDENT_AMBULATORY_CARE_PROVIDER_SITE_OTHER): Payer: Medicare Other | Admitting: Orthopaedic Surgery

## 2019-03-25 ENCOUNTER — Encounter (INDEPENDENT_AMBULATORY_CARE_PROVIDER_SITE_OTHER): Payer: Self-pay | Admitting: Orthopaedic Surgery

## 2019-03-25 VITALS — BP 178/68 | HR 80 | Temp 97.0°F | Ht 71.0 in | Wt 208.0 lb

## 2019-03-25 DIAGNOSIS — S7002XA Contusion of left hip, initial encounter: Secondary | ICD-10-CM

## 2019-03-25 DIAGNOSIS — W19XXXA Unspecified fall, initial encounter: Secondary | ICD-10-CM

## 2019-03-25 NOTE — Progress Notes (Signed)
Chief complaint: left hip pain    History:  68 year old male sustained a fall onto the left hip a few days ago.  He had marked pain.  Difficulty ambulating presented to physical therapist was having too much pain.  He presented to urgent care.  X-rays were unremarkable.  He still having pain.  He is using a cane.  He has had some improvement.  She denies any loss of consciousness or syncope associated with this event.    Past medical history, surgical history and social history are in the chart and have been reviewed  Family history noncontributory    ALLERGIES and medications are in the chart and I reviewed them    Review of symptoms: Negative for fevers, chills, chest pain, shortness of breath.  Remainder of the review symptoms was otherwise fully negative for all 10 systems    Physical Examination:  Alert and oriented male in no acute distress afebrile, vital signs stable.  Examination of the bilateral lower extremities reveals he sensory and vascularly intact with 5 out of 5 strength in all major muscle groups.  Left hip shows good range of motion with internal and external rotation without pain.  No deformity.  Some tenderness to palpation over the greater trochanter.  Good range of motion of the left knee without pain.    Imaging: X-rays reviewed and are unremarkable of the hip.    Impression and plan:  68 year old male left hip contusion.  There is no evidence of a fracture  He may continue weightbearing as tolerated.  He may work with physical therapy on the hip.  Follow up as needed.

## 2019-03-30 ENCOUNTER — Inpatient Hospital Stay: Payer: Medicare Other | Attending: Physical Medicine & Rehabilitation

## 2019-03-30 DIAGNOSIS — M25552 Pain in left hip: Secondary | ICD-10-CM | POA: Insufficient documentation

## 2019-03-30 DIAGNOSIS — M5441 Lumbago with sciatica, right side: Secondary | ICD-10-CM | POA: Insufficient documentation

## 2019-03-30 NOTE — Progress Notes (Signed)
Name:Kent Jordan Age: 68 y.o.   Date of Service: 03/30/2019  Referring Physician: Raleigh Callas, MD   Date of Injury: 03/03/2019  Date Care Plan Established/Reviewed: 03/30/2019  Date Treatment Started: 03/03/2019  End of Certification Date: 06/27/2019  Sessions in Plan of Care: 16  Surgery Date: No data was found      Visit Count: 5   Diagnosis:   1. Acute bilateral low back pain with right-sided sciatica    2. Hip pain, acute, left        Subjective     History of Present Illness   Functional Limitations (PLOF): Marland Kitchen  Walking - immediate p! Which worsens and feels like he is getting weaker But abel to push himself to walk 20 min to walk dog  Bending over  Lifting/carrying   Sleeping, hip pain wakes him up    Social Support/Occupation  Lives in: multiple level home  Lives with: spouse  Occupation: retired    I got a script for the L hip from Dr Jacqulyn Bath. The hip was sore for 2-3 days and that made my R hip more sore. And my back has been bothering me.   Been doing a lot of bending over and carried 25 lb bag of charcoal.       Precautions: No data was found  Allergies: Angiotensin receptor blockers; Ciprofloxacin; Ace inhibitors; and Gabapentin    Past Medical History:   Diagnosis Date    Arrhythmia     when on ARB    Arthritis     psoriatic and osteoarthritis     Back pain     Blood disorder 11/2014    Hemochromatosis    Diabetes mellitus 2016    started metformin 05/2016 d/t A1C 7.1 - now checking BS 2x/day - average 70-110 in the last week - A1C on 06/29/16 = 6.3 , has also lost 16 lbs since 05/2016    Difficulty in walking(719.7)     Uses cane to ambulate    Elevated liver function tests     had abd u/s 07/05/16     Failed back syndrome     Fatty liver     Hemochromatosis     phlebotomy every 3 months - last done 02/2016    High cholesterol     Hypertensive disorder 06/2016    norvasc recently increased to 10 mg daily - now controlled on meds - most recent BP 117/73    Low back pain     Lung nodule 2012     benign - was monitored x  3 yrs by Dr. Bernita Raisin (Pulm) - signed off from pulmonology - no further f/u required since 2015    Neuropathy     Tingling left foot, numbness left toes    Psoriasis     Psoriatic arthritis     tx w/ enbrel    S/P insertion of spinal cord stimulator 2015    Sleep apnea     mild - does not have CPAP - was unable to tolerate, less snoring since turboplasty       Objective   .              Range of Motion       6/9  L PROM  7/6  Left PROM    Lumbar/Hip  6/9  R PROM  7/6  Right PROM       Lumbar Rotation       90  90 Hip Flexion  90  90       Hip Extension           Hip Abduction           Hip Adduction       0  0 Hip IR -10  -5   25  30  Hip ER 45  45   (blank fields were intentionally left blank)              Strength       6/9  7/6  Left Strength  Hip     6/9  Right  7/6  Right   4  4+ Hip Flexion 3 p! 4       Hip Extension           Hip Abduction           Hip Adduction       5   Hip IR 5     5   Hip ER 5     5  5  Quadriceps 5  5   4+  4 Hamstrings 4+  4+   (blank fields were intentionally left blank)      6/9  7/9  Left Strength  Ankle/Foot     6/9  7/6  Right   4+  4+ Ankle Dorsiflexion 4  4+   5  5 Ankle Plantarflexion 5  5       Ankle Inversion           Ankle Eversion       (blank fields were intentionally left blank)        Treatment     Therapeutic Exercises   Justification: Case management   See objective measures    Review of sleeping in S/L position     Review/update of HEP, all of below with verbal/tactile cues:  - review of seated bracing   - ok to d/c bridges for now     Manual Therapy   S/L BC innom, sacrum  L SIJ gapping, sacrum inf glide    Supine   Inf/lat glide of L femur with strap CR into ext to improve flexion  STM iliacus    Innominate flexion mobs using sacral block, CR into innom/hip ext  followowed by end range hip/innominate flexion PH, then small range COI into new end range    Inf glide femur, BC ASIS and IT on R           ---      ---   Total Time   Timed  Minutes  45 minutes   Total Time  45 minutes        Assessment   Patient has been seen for 5 visits in this case from 03/03/2019 until 03/30/2019. Diagnoses of Acute bilateral low back pain with right-sided sciatica and Hip pain, acute, left (added today) were pertinent to this visit.  Patient has made good progress in range of motion and strength since the initial evaluation in spite of recent aggravation of Left hip pain (added to POC today). Patient is still demonstrating impairment in pain, range of motion, strength, functional stability, joint mobility, soft tissue mobility, motor control, coordination, static balance and dynamic balance. Patient would benefit from continued skilled PT intervention to address these deficits and achieve the below listed goals.      Barriers to therapy: Sig PMH (see IE)    Functional Limitations (PLOF): Marland Kitchen  Walking - immediate p! Which worsens and feels like he  is getting weaker But abel to push himself to walk 20 min to walk dog  Bending over  Lifting/carrying   Sleeping, hip pain wakes him up  Prognosis: excellent  Plan   Visits per week: 2  Number of Sessions: 16  [employer] on One  54098: Therapeutic Exercise: To Develop Strength and Endurance, ROM and Flexibility  [id number]: Gait Training  11914: Neuromuscular Reeducation (Proprioceptive Neuromuscular Faciliation)  97140: Manual Therapy techniques (mobilization, manipulation, manual traction) (Grade I-V to lumbar spine, pelvic girdle, and regionally interdependent joints, soft tissue mobilization, instrument assisted soft tissue mobilization.)  97530: Therapeutic Activities: Dynamic activities to improve functional performance  Dry Needling  Supervised Modalities  97010: Thermal modalities: hot/cold packs  78295: Mechnical traction  97014: Electrical stimulation    Goals    Goal 1:  .  Pt will demonstrate painfree hip flex MMT 5/5 bilaterally to allow for painfree community ambulation x 30 min     R hip flex MMT 4/5 painfree vs 3/5  p! at IE  03/30/2019 MM   Sessions:  16   Progression:  progressing      Goal 2:  .  Pt will report ability to sleep waking once nightly or less to reposition using positioning techniques to increase restful sleep and ease OOB transition in AM    Worsened again after initial improvement since aggravation of L hip pain   03/30/2019 MM   Sessions:  6   Progression:  not met       Goal 3:  .  Pt will demonstrate SLS x 10 sec bilaterally on level/unlevel surfaces to decrease fall risk     Sessions:  16      Goal 4:  .  Pt will demonstrate painfree hip flexion >/= 100 deg to allow pt to hip hinge squat with efficient mechanics for lifting 10 lbs from floor to waist     Improved to R 95 L 100 at end of session today  03/30/2019 MM   Sessions:  16          Goal 5:    Patient will demonstrate independence in prescribed HEP 1x/day with proper form, sets and reps for safe discharge to an independent program.   See below for current HEP:  - seated bracing 30 sex 10x/day   - supine bridges  03/18/2019 MM     Sessions:  16   Progression:  progressing      Goal 6:  .  Pt will demonstrate increase in FOTO score by at least MDC to return to PLOF       FOTO score at IE = 45  MDC = 6  predictaed score at visit #13 = 54                        Ashton Belote Alford Highland, PT

## 2019-04-01 ENCOUNTER — Encounter (INDEPENDENT_AMBULATORY_CARE_PROVIDER_SITE_OTHER): Payer: Self-pay

## 2019-04-01 ENCOUNTER — Inpatient Hospital Stay: Payer: Medicare Other

## 2019-04-01 DIAGNOSIS — M25552 Pain in left hip: Secondary | ICD-10-CM

## 2019-04-01 DIAGNOSIS — M5441 Lumbago with sciatica, right side: Secondary | ICD-10-CM

## 2019-04-01 NOTE — PT/OT Therapy Note (Signed)
Name: Kent Jordan Age: 68 y.o.   Date of Service: 04/01/2019  Referring Physician: Raleigh Callas, MD   Date of Injury: 03/03/2019  Date Care Plan Established/Reviewed: 03/30/2019  Date Treatment Started: 03/03/2019  End of Certification Date: 06/27/2019  Sessions in Plan of Care: 16  Surgery Date: No data was found    Visit Count: 6   Diagnosis:   1. Hip pain, acute, left    2. Acute bilateral low back pain with right-sided sciatica        Subjective     Social Support/Occupation  Lives in: multiple level home  Lives with: spouse  Occupation: retired    Today I am better than yesterday. I feel like I don't need the cane now I just use it when I come here.   Stairs are still really really tough, most of the time I try to go step over step and holding on for balance, I am so fatogued by the time I get to the top and I have to hold on to the knob to pull myself up the rest of the way.  I still feel like I am walking through water       Precautions: No data was found  Allergies: Angiotensin receptor blockers; Ciprofloxacin; Ace inhibitors; and Gabapentin    Objective   MMT  Hip flex R 4/5 L 4+/5  Post treatment: R 5/5 L 4+/5    SLS  R 10 sec L 6 sec              Treatment     Therapeutic Exercises   Justification: To improve strength/endurance/ROM as below with use of verbal/tactile cues by PT     Review/update of HEP, all of below with verbal/tactile cues:  - Basking seal    See objective measures     Neuromuscular Re-Education   Justification: To improve initiation of appropriate muscles with efficient timing using manual facilitation by PT     To increase core initiation to improve pelvic girdle/core stability  (Pt positioned in S/L top hip flexed to ~90 deg with ankle at end range DF)  Isomerically resisted LE flex/add/IR for irradiation into trunk, adding resistance to pelvic AE, prolonged hold aiming for magnetic click, followed by small range COI for resisted AE with LE flex/add/IR first resisting pelvis and hip  as one unit, then dissociating LE from pelvis    L Pelvic AE with end range holds and COI with LE flex/ adduction/ IR/ DF/ Inv to improve neuromuscular firing to improve stepping ability        Manual Therapy   S/L to improve pelvic mobility and ROM into AE/PD  Bony contours pelvic crest  STM QL, lumbar paraspinals, iliacus/abdom contents  R SIJ gapping  Inf glide sacrum  P/A R sacrum         ---      ---   Total Time   Timed Minutes  40 minutes   Total Time  40 minutes        Assessment   Pt initially with increased pain immediately with attempted pelvic AE much improved by end of session, pt reports fatigue at end of session but does not feel like he is dragging his R leg any more. Improved initiation and strength into L pelvic AE and able to add painfree Basking seal bilaterally to HEP. R hip flex MMT improved tot 5/5 by end of session for first time.   Plan   Re-assess gait,  hip flex MMT  Progress to seated wt accept for stairs       Goals    Goal 1:  .  Pt will demonstrate painfree hip flex MMT 5/5 bilaterally to allow for painfree community ambulation x 30 min     Post treatment: R 5/5 L 4+/5 vs 3/5 p! at IE  04/01/2019 MM   Sessions:  16   Progression:  progressing      Goal 2:  .  Pt will report ability to sleep waking once nightly or less to reposition using positioning techniques to increase restful sleep and ease OOB transition in AM    Worsened again after initial improvement since aggravation of L hip pain   03/30/2019 MM   Sessions:  6   Progression:  not met       Goal 3:  .  Pt will demonstrate SLS x 10 sec bilaterally on level/unlevel surfaces to decrease fall risk      SLS  R 10 sec L 6 sec   03/31/2019 MM       Sessions:  16   Progression:  progressing      Goal 4:  .  Pt will demonstrate painfree hip flexion >/= 100 deg to allow pt to hip hinge squat with efficient mechanics for lifting 10 lbs from floor to waist     Improved to R 95 L 100 at end of session today  03/30/2019 MM   Sessions:  16    Progression:  progressing          Goal 5:    Patient will demonstrate independence in prescribed HEP 1x/day with proper form, sets and reps for safe discharge to an independent program.   See below for current HEP:  - seated bracing 30 sex 10x/day   - supine bridges  - Basking seal   04/01/2019 MM     Sessions:  16   Progression:  progressing      Goal 6:  .  Pt will demonstrate increase in FOTO score by at least MDC to return to PLOF       FOTO score at IE = 45  MDC = 6  predictaed score at visit #13 = 54                        Albena Comes Alford Highland, PT

## 2019-04-08 ENCOUNTER — Inpatient Hospital Stay: Payer: Medicare Other

## 2019-04-09 ENCOUNTER — Encounter (INDEPENDENT_AMBULATORY_CARE_PROVIDER_SITE_OTHER): Payer: Self-pay | Admitting: Orthopaedic Surgery

## 2019-04-09 ENCOUNTER — Ambulatory Visit (INDEPENDENT_AMBULATORY_CARE_PROVIDER_SITE_OTHER): Payer: Medicare Other | Admitting: Family Medicine

## 2019-04-09 ENCOUNTER — Encounter (INDEPENDENT_AMBULATORY_CARE_PROVIDER_SITE_OTHER): Payer: Self-pay | Admitting: Family Medicine

## 2019-04-09 ENCOUNTER — Ambulatory Visit (INDEPENDENT_AMBULATORY_CARE_PROVIDER_SITE_OTHER): Payer: Medicare Other | Admitting: Orthopaedic Surgery

## 2019-04-09 VITALS — Temp 98.4°F | Ht 71.0 in | Wt 207.0 lb

## 2019-04-09 DIAGNOSIS — M25852 Other specified joint disorders, left hip: Secondary | ICD-10-CM

## 2019-04-09 DIAGNOSIS — M25552 Pain in left hip: Secondary | ICD-10-CM

## 2019-04-09 DIAGNOSIS — L4050 Arthropathic psoriasis, unspecified: Secondary | ICD-10-CM

## 2019-04-09 MED ORDER — TRIAMCINOLONE ACETONIDE 40 MG/ML IJ SUSP
80.00 mg | Freq: Once | INTRAMUSCULAR | Status: AC
Start: 2019-04-09 — End: 2019-04-09
  Administered 2019-04-09: 12:00:00 80 mg via INTRA_ARTICULAR

## 2019-04-09 NOTE — Progress Notes (Signed)
Encounter Diagnoses   Name Primary?    Psoriatic arthritis Yes    Left hip impingement syndrome      ULTRASOUND GUIDED INJECTION  Verbal informed consent was obtained prior to the procedure. Prior to this, the risks and side effects were discussed, including, but not limited to pain, infection, allergic reaction, bleeding, damage to tissues, vessels and nerves, skin atrophy, steroid flare.    Using the [employer] e [place] system, I performed a ULTRASOUND guided Injection of the left  hip via a in-plane approach after  chlorohexadine prep and needle localization with the 4C Probe in long axis using a 22 ga 1.5" needle, , injecting 80mg   Kenalog and 3 ml Lidocaine 1% w/o Epi.  -----> 50 % relief of symptoms after injection. The patient tolerated the procedure well without complications.    The injection was performed for therapeutic purposes

## 2019-04-09 NOTE — Progress Notes (Signed)
[facility]    Provider: Marjory Sneddon, MD  Date of Exam:  04/09/2019   Patient:  Kent Jordan  DOB:  October 18, 1950    AGE:  68 y.o.  MR#:  [id number]     Chief Complaint: Left hip pain.    HPI: Pranay is a pleasant 68 y.o. male who presents with 3 weeks of left hip pain that began when he rolled hs foot when he was walking his dog. Marland Kitchen He reports a specific MOI that caused his hip pain. The pain is described as sharp and consistent and is localized to the back, lateral hip, groin and leg. He had 3 prior back surgeries and has chronic pain issues and has a nerve stimulator in place.  He reports the pain is rated at a 5/10 on today's visit. His pain is made worse with prolonged periods of sitting, standing, walking and  ascending/descending stairs which is not improved with activity modification or NSAIDS/pain analgesics. The pain does limit activity. He has attempted 0 days of formal physical therapy of the hip . He has had not a prior bursal/intraarticular corticosteroid/lidocaine hip injection  He denies mechanical symptoms, reports radiating pain/parasthesias/weakness, reports low back pain and also denies bowel/bladder dysfunction.   Tracey is now here for further evaluation and discussion of treatment options.    For other past medical history, social history, family history and past surgical history please see them listed below.    Club/School/Work Affiliation: none    Problem List:   Patient Active Problem List   Diagnosis    Mixed hyperlipidemia    Essential hypertension    Bigeminal rhythm    Arrhythmia    Primary osteoarthritis of right knee    Status post right knee replacement    Type 2 diabetes mellitus without complication, without long-term current use of insulin    Other hemochromatosis    Acute bilateral low back pain with sciatica, sciatica laterality unspecified    Anterior knee pain, right    Overweight (BMI 25.0-29.9)    Elevated LFTs    Psoriatic arthritis    Acute bilateral  low back pain with right-sided sciatica    Hip pain, acute, left        Past Medical History:   Past Medical History:   Diagnosis Date    Arrhythmia     when on ARB    Arthritis     psoriatic and osteoarthritis     Back pain     Blood disorder 11/2014    Hemochromatosis    Diabetes mellitus 2016    started metformin 05/2016 d/t A1C 7.1 - now checking BS 2x/day - average 70-110 in the last week - A1C on 06/29/16 = 6.3 , has also lost 16 lbs since 05/2016    Difficulty in walking(719.7)     Uses cane to ambulate    Elevated liver function tests     had abd u/s 07/05/16     Failed back syndrome     Fatty liver     Hemochromatosis     phlebotomy every 3 months - last done 02/2016    High cholesterol     Hypertensive disorder 06/2016    norvasc recently increased to 10 mg daily - now controlled on meds - most recent BP 117/73    Low back pain     Lung nodule 2012    benign - was monitored x  3 yrs by Dr. Bernita Raisin (Pul[facility]) - signed off from  pulmonology - no further f/u required since 2015    Neuropathy     Tingling left foot, numbness left toes    Psoriasis     Psoriatic arthritis     tx w/ enbrel    S/P insertion of spinal cord stimulator 2015    Sleep apnea     mild - does not have CPAP - was unable to tolerate, less snoring since turboplasty       Social History:   Social History     Tobacco Use    Smoking status: Former Smoker     Packs/day: 1.00     Years: 41.00     Pack years: 41.00     Last attempt to quit: 09/26/2011     Years since quitting: 7.5    Smokeless tobacco: Never Used   Substance Use Topics    Alcohol use: Yes     Alcohol/week: 2.0 standard drinks     Types: 4 Cans of beer per week    Drug use: No       Family History:   Family History   Problem Relation Age of Onset    Heart disease Mother     Heart disease Father        Past Surgical History:   Past Surgical History:   Procedure Laterality Date    ABDOMINAL SURGERY  03/1971    double hernia repair    ARTHROPLASTY, KNEE, TOTAL Right  08/20/2016    Procedure: ARTHROPLASTY, KNEE, TOTAL;  Surgeon: Harriet Masson, MD;  Location: [facility];  Service: Orthopedics;  Laterality: Right;  RIGHT TOTAL KNEE REPLACEMENT    ARTHROSCOPY, KNEE Right 03/21/2016    Procedure: ARTHROSCOPY, KNEE, RIGHT, PARTIAL MEDIAL MENISCECTOMY, PARTIAL SYNOVECTOMY;  Surgeon: Verner Chol, MD;  Location: [facility];  Service: Orthopedics;  Laterality: Right;    CATARACT EXT.WITH IOL Bilateral 2009    FRACTURE SURGERY  1993    right middle finger    INSERTION, SPINAL CORD STIMULATOR GENERATOR N/A 05/07/2014    Procedure: DORSAL COLUMN STIMULATOR PLACEMENT;  Surgeon: Tera Helper, MD;  Location: [facility];  Service: Neurosurgery;  Laterality: N/A;  DCS PLACEMENT    KNEE ARTHROCENTESIS  1983    right    LUMBAR FUSION  10/2011    MICRODISCECTOMY LUMBAR  06/2011, 07/2011    SEPTOPLASTY  10/05/2015    SKIN BIOPSY  11/2015    Negative       Medications:   Current Outpatient Medications:     amLODIPine (NORVASC) 10 MG tablet, Take 10 mg by mouth daily  , Disp: , Rfl:     aspirin 81 MG tablet, Take 81 mg by mouth daily., Disp: , Rfl:     celecoxib (CeleBREX) 200 MG capsule, Take 200 mg by mouth 2 (two) times daily, Disp: , Rfl:     Cosentyx Sensoready, 300 MG, 150 MG/ML Solution Auto-injector, , Disp: , Rfl:     DULoxetine (CYMBALTA) 60 MG capsule, Take 60 mg by mouth daily., Disp: , Rfl:     folic acid (FOLVITE) 400 MCG tablet, Take 400 mcg by mouth daily., Disp: , Rfl:     FREESTYLE LITE test strip, 1 each by Other route 2 (two) times daily., Disp: 100 each, Rfl: 2    hydroCHLOROthiazide (HYDRODIURIL) 25 MG tablet, Take 25 mg by mouth daily.  , Disp: , Rfl:     ibuprofen (ADVIL,MOTRIN) 600 MG tablet, Take 1 tablet (600 mg total) by mouth every 8 (  eight) hours as needed for Pain., Disp: 30 tablet, Rfl: 0    Lancets (FREESTYLE) lancets, 1 each by Other route 2 (two) times daily., Disp: 100 each, Rfl: 11    lidocaine (LIDODERM) 5 %, Place 1 patch  onto the skin every 12 (twelve) hours Remove & Discard patch within 12 hours or as directed by MD, Disp: 6 patch, Rfl: 0    metFORMIN (GLUCOPHAGE-XR) 500 MG 24 hr tablet, Take 1 tablet (500 mg total) by mouth every morning with breakfast, Disp: 90 tablet, Rfl: 0    metoprolol succinate XL (TOPROL-XL) 25 MG 24 hr tablet, Take 25 mg by mouth daily  , Disp: , Rfl: 3    rosuvastatin (CRESTOR) 5 MG tablet, Take 1 tablet (5 mg total) by mouth daily, Disp: 90 tablet, Rfl: 1    Secukinumab, 300 MG Dose, (Cosentyx, 300 MG Dose,) 150 MG/ML Solution Prefilled Syringe, Inject into the skin, Disp: , Rfl:     traZODone (DESYREL) 50 MG tablet, Take 50 mg by mouth nightly as needed.  , Disp: , Rfl:   No current facility-administered medications for this visit.     Allergies:   Allergies   Allergen Reactions    Angiotensin Receptor Blockers Other (See Comments)     ARB combined with Celebrex caused Arrhythmia due to high K+    Ciprofloxacin Swelling and Anaphylaxis     swelling    Ace Inhibitors Angioedema    Gabapentin Other (See Comments)     blurred vision, dizziness       ROS:  Constitutional: No fatigue, fever, weight loss, or weight gain.   Ears, Nose, Mouth & Throat: No sore throat or hearing loss.   Cardiovascular: No chest pain, blood clots, or leg cramps.   Respiratory: No shortness of breath, cough, or difficulty breathing.   Gastrointestinal: No nausea, vomiting, diarrhea, or loss of appetite.   Genitourinary: No polyuria or kidney disease.   Musculoskeletal: No joint aches, muscle weakness or swelling of joints/body parts other than that mentioned above/below.  Integumentary: No finger nail changes or skin dryness.   Neurological: No numbness, burning discomfort, or headaches.   Psychiatric: No depression or anxiety.   Endocrine: No increased thirst, change in appetite or thyroid disease.   Hematologic/Lymphatic: No easy bruising or anemia.     EXAM:   Left Hip Exam:    Skin intact  Erythema: None  Swelling:  None  Effusion: None  Deformities: None  Leg Length Discrepancy: Negative  ROM: FF: 90  ABD: 50 ER: 30  IR: 30  Drehmanns (Passive hip ER when hip flexed): Positive  IP TTP: Negative  Greater Trochanter TTP: Positive  Other TTP: None    Log Roll: Negative  Stinchfield (Resisted SLR): Positive  FADIR (impingement Test): Positive  FABER Luisa Hart): Positive  EXT/ER Posterior Impingement: Positive  Internal Snapping: Negative  External Snapping: Negative  Thomas Test: Negative  Ober Test: Positive  Popliteal Angle (40) degrees  Straight Leg Raise: Positive  Crossed Straight Leg Raise: Positive    Strength: WNL    Other areas of Tenderness: None  DTR and Pathological Reflexes Intact  No lymphadenopathy  Sensation Intact to light touch all distributions of leg and foot  DF, PF, EHL motor intact  Foot Perfused with CR <2 sec  DP/PT pulse 2+    Right Hip Exam:    Skin intact  Erythema: None  Swelling: None  Effusion: None  Deformities: None  Leg Length Discrepancy: Negative  ROM: FF:  120    ABD: 60   ER: 60   IR: 60  Drehmanns (Passive hip ER when hip flexed): Negative  IP TTP: None  Greater Trochanter TTP: None  Other TTP: None    Log Roll: Negative  Stinchfield (Resisted SLR): Negative  FADIR (impingement Test): Negative  FABER Luisa Hart): Negative  EXT/ER Posterior Impingement: Negative  Internal Snapping: Negative  External Snapping: Negative  Thomas Test: Negative  Ober Test: Negative  Popliteal Angle: WNL  Straight Leg Raise: Negative  Crossed Straight Leg Raise: Negative    Strength: WNL    Other areas of Tenderness: None  DTR and Pathological Reflexes Intact  No lymphadenopathy  Sensation Intact to light touch all distributions of leg and foot  DF, PF, EHL motor intact  Foot Perfused with CR <2 sec  DP/PT pulse 2+    l-Spine Stance: There is positive asymmetry, positive atrophy, abnormal pelvic obliquity and abnormal spinal alignment.    Vitals: Temp 98.4 F (36.9 C) (Oral)    Ht 1.803 m (5\' 11" )    Wt 93.9 kg  (207 lb)    BMI 28.87 kg/m     General: Shailen was pleasant, oriented, easily engaged, displayed logical thinking with clear speech and was neat in appearance. His general appearance was normal, well-developed, well-nourished and appeared their stated age.    Gait: The patient demonstrated sig antalgic gait with intact coordination and balance.     STUDIES: AP pelvis, lateral views of the left hip were ordered and reviewed on today's visit. They reveal a negative cross over sign, negative ischial spine sign, negative coxa profunda, negative acetabular protrusion, Tonnis Grade 1, negative joint space narrowing < 2 mm, and an Alpha angle of <55 degrees. They also have a normal LCEA,  negative osseous abnormalities and they have positive skeletal maturity.    ASSESSMENT/PLAN: Drakkar is a pleasant 68 y.o. male with historical and clinical evidence of back pain and sig left hip pain.  The patient's diagnosis and treatment options were discussed in detail at today's visit.  After discussion and patient consent we have decided to proceed with a IA cortisone injection for management and potential alleviation of the patient's symptoms. We will await the patient's response to the intra articular steroid injection. Also he will begin therapy for his hip. We reviewed the standard conservative and surgical management of his diagnosis as well as the use of medications such as Tylenol and NSAIDS.  We discussed the limited role of injection therapy in management and its potential role in diagnosis and short term pain relief.  The potential need for surgical intervention was described in detail including the procedure, risks, rehab and recovery.  All questions were answered to the patient's satisfaction.  The patient will notify my clinic of any changes or worsening of their symptoms during the interim.      More than 20 minutes was spent with the patient at today's visit with more than 50% of that time spent discussing the patient's  diagnosis, alternative treatments, potential outcomes and recommended treatment plan.    N02  20 min     E12  10 min  N03  30 min     E13  15 min  N04  45 min     E14  25 min  N05  60 min     E15  40 min

## 2019-04-10 ENCOUNTER — Inpatient Hospital Stay: Payer: Medicare Other

## 2019-04-13 ENCOUNTER — Inpatient Hospital Stay: Payer: Medicare Other

## 2019-04-13 DIAGNOSIS — M25552 Pain in left hip: Secondary | ICD-10-CM

## 2019-04-13 DIAGNOSIS — M5441 Lumbago with sciatica, right side: Secondary | ICD-10-CM

## 2019-04-13 NOTE — PT/OT Therapy Note (Signed)
Name: Kent Jordan Age: 68 y.o.   Date of Service: 04/13/2019  Referring Physician: Raleigh Callas, MD   Date of Injury: 03/03/2019  Date Care Plan Established/Reviewed: 03/30/2019  Date Treatment Started: 03/03/2019  End of Certification Date: 06/27/2019  Sessions in Plan of Care: 16  Surgery Date: No data was found    Visit Count: 7   Diagnosis:   1. Hip pain, acute, left    2. Acute bilateral low back pain with right-sided sciatica        Subjective     Social Support/Occupation  Lives in: multiple level home  Lives with: spouse  Occupation: retired    I went to three [employer] and I was miserable. They took an ultrasound of my hip and found fluid and an inflamed bursa. He gave me a steroid injection. Yesterday I finally started feeling better. Still unable to WB 100 percent put able to twist and turn a little more and feeling better.   My back was hurting when I woke up his morning. Have not had any shooting pain down R LE   I have not been moving much in the past few days and have not done HEP. I was unable to do it because of the pain.   Md said I dont have any glutes       Precautions: No data was found  Allergies: Angiotensin receptor blockers; Ciprofloxacin; Ace inhibitors; and Gabapentin    Objective   Gait - L LE ER, wide BOS, short stride length R>L  Hip flex MMT R 3+/5 L 4+/5    HISL - hip 85, 95 hard endfeel     Billed with TE             Treatment     Therapeutic Exercises   Justification: To improve strength/endurance/ROM as below with use of verbal/tactile cues by PT     Review/update of HEP, all of below with verbal/tactile cues:  - seated bracing   - try single knee to chest    See objective     Neuromuscular Re-Education   Justification: To improve initiation of appropriate muscles with efficient timing using manual facilitation by PT     R then L Supine resisted LE with hip flexion/ adduction/ER to facilitate core contraction with prolonged holds and with irradiation from B UE chop  pattern      Manual Therapy   Justification: To improve pelvic girdle mobility:      Prone  SF/BC sacrum, coccyx, SIJs  P/A mobs grade 3,4 of sacro-coccygeal joint with CR resisted hip IR and/or knee flexion   R to L mob coccyx  Inferior mobs grade 3,4 of sacrum with AROM knee flexion and/or LTR    STM L iliacus, prox HS       ---      ---   Total Time   Timed Minutes  45 minutes   Total Time  45 minutes        Assessment   R hip flex MMT improved to 5/5 at end of session, hip flexion on L improved from 85  To 95 deg. Cont to treat pelvic girdle and L hip mobility.   Plan   See above       Goals    Goal 1:  .  Pt will demonstrate painfree hip flex MMT 5/5 bilaterally to allow for painfree community ambulation x 30 min     Post treatment: R 5/5 L 4+/5 vs  3/5 p! at IE  04/01/2019 MM   Sessions:  16   Progression:  progressing      Goal 2:  .  Pt will report ability to sleep waking once nightly or less to reposition using positioning techniques to increase restful sleep and ease OOB transition in AM    Worsened again after initial improvement since aggravation of L hip pain   03/30/2019 MM   Sessions:  6   Progression:  not met       Goal 3:  .  Pt will demonstrate SLS x 10 sec bilaterally on level/unlevel surfaces to decrease fall risk      SLS  R 10 sec L 6 sec   03/31/2019 MM       Sessions:  16   Progression:  progressing      Goal 4:  .  Pt will demonstrate painfree hip flexion >/= 100 deg to allow pt to hip hinge squat with efficient mechanics for lifting 10 lbs from floor to waist     Improved to R 95 L 100 at end of session today  03/30/2019 MM   Sessions:  16   Progression:  progressing          Goal 5:    Patient will demonstrate independence in prescribed HEP 1x/day with proper form, sets and reps for safe discharge to an independent program.   See below for current HEP:  - seated bracing 30 sex 10x/day   - supine bridges  - Basking seal   04/01/2019 MM     Sessions:  16   Progression:  progressing      Goal 6:  .  Pt  will demonstrate increase in FOTO score by at least MDC to return to PLOF       FOTO score at IE = 45  MDC = 6  predictaed score at visit #13 = 54                        Cinde Ebert Alford Highland, PT

## 2019-04-15 ENCOUNTER — Inpatient Hospital Stay: Payer: Medicare Other

## 2019-04-15 DIAGNOSIS — M25552 Pain in left hip: Secondary | ICD-10-CM

## 2019-04-15 DIAGNOSIS — M5441 Lumbago with sciatica, right side: Secondary | ICD-10-CM

## 2019-04-15 NOTE — PT/OT Therapy Note (Signed)
Name: Kent Jordan Age: 68 y.o.   Date of Service: 04/15/2019  Referring Physician: Raleigh Callas, MD   Date of Injury: 03/03/2019  Date Care Plan Established/Reviewed: 03/30/2019  Date Treatment Started: 03/03/2019  End of Certification Date: 06/27/2019  Sessions in Plan of Care: 16  Surgery Date: No data was found    Visit Count: 8   Diagnosis:   1. Hip pain, acute, left    2. Acute bilateral low back pain with right-sided sciatica        Subjective     Social Support/Occupation  Lives in: multiple level home  Lives with: spouse  Occupation: retired    I felt ok on Monday, not yesterda. Pain in L ant hip and across LB with standing and walking, better today than yesterday.   Its like an ache that sometimes turns into a stab. Back and hip pain are together.       Precautions: No data was found  Allergies: Angiotensin receptor blockers; Ciprofloxacin; Ace inhibitors; and Gabapentin    Objective   Leg swing on L - lacking hip extension and innom extension, p! At end range extension              Treatment     Therapeutic Exercises   Justification: To improve strength/endurance/ROM as below with use of verbal/tactile cues by PT     Review/update of HEP, all of below with verbal/tactile cues:  - bridges   Billed with NMR    Neuromuscular Re-Education   Justification: To improve initiation of appropriate muscles with efficient timing using manual facilitation by PT     End range L hip/innom Ir PH, COI    L Pelvic PD with LE ext/ abd/IR with end range holds and COI to improve weight acceptance into LE    Manual Therapy   To improve pelvic girdle mobility:    Prone  SF/BC sacrum, SIJs  Inferior mobs grade 3,4 of sacrum with AROM knee flexion and/or LTR  P/A mobs grade 3,4 of sacrum with CR resisted hip IR same side    L Hip and innom IR mobilization in prone    S/L  Hip extensin PROM, CR to improve mobility of rectus femoris              ---      ---   Total Time   Timed Minutes  40 minutes   Total Time  40 minutes         Assessment   Pt VERY challenged with glute initiation in S/L with pelvic patterns above, added bridges to maintain at home. Improved hip extension in S/L at end of session but does not yet translate to standing   Plan   Re-assess L hip ext, follow up on std p!      Goals    Goal 1:  .  Pt will demonstrate painfree hip flex MMT 5/5 bilaterally to allow for painfree community ambulation x 30 min     Post treatment: R 5/5 L 4+/5 vs 3/5 p! at IE  04/01/2019 MM   Sessions:  16   Progression:  progressing      Goal 2:  .  Pt will report ability to sleep waking once nightly or less to reposition using positioning techniques to increase restful sleep and ease OOB transition in AM    Worsened again after initial improvement since aggravation of L hip pain   03/30/2019 MM   Sessions:  6  Progression:  not met       Goal 3:  .  Pt will demonstrate SLS x 10 sec bilaterally on level/unlevel surfaces to decrease fall risk      SLS  R 10 sec L 6 sec   03/31/2019 MM       Sessions:  16   Progression:  progressing      Goal 4:  .  Pt will demonstrate painfree hip flexion >/= 100 deg to allow pt to hip hinge squat with efficient mechanics for lifting 10 lbs from floor to waist     Improved to R 95 L 100 at end of session today  03/30/2019 MM   Sessions:  16   Progression:  progressing          Goal 5:    Patient will demonstrate independence in prescribed HEP 1x/day with proper form, sets and reps for safe discharge to an independent program.   See below for current HEP:  - seated bracing 30 sex 10x/day   - supine bridges  04/15/2019 MM     Sessions:  16   Progression:  progressing      Goal 6:  .  Pt will demonstrate increase in FOTO score by at least MDC to return to PLOF       FOTO score at IE = 45  MDC = 6  predictaed score at visit #13 = 54                        Hale Chalfin Alford Highland, PT

## 2019-04-22 ENCOUNTER — Inpatient Hospital Stay: Payer: Medicare Other

## 2019-04-22 DIAGNOSIS — M25552 Pain in left hip: Secondary | ICD-10-CM

## 2019-04-22 DIAGNOSIS — M5441 Lumbago with sciatica, right side: Secondary | ICD-10-CM

## 2019-04-22 NOTE — PT/OT Therapy Note (Signed)
Name: Kent Jordan Age: 68 y.o.   Date of Service: 04/22/2019  Referring Physician: Raleigh Callas, MD   Date of Injury: 03/03/2019  Date Care Plan Established/Reviewed: 03/30/2019  Date Treatment Started: 03/03/2019  End of Certification Date: 06/27/2019  Sessions in Plan of Care: 16  Surgery Date: No data was found    Visit Count: 9   Diagnosis:   1. Hip pain, acute, left    2. Acute bilateral low back pain with right-sided sciatica        Subjective     Social Support/Occupation  Lives in: multiple level home  Lives with: spouse  Occupation: retired    I feel good today. Been busy and my LB hurts but not too bad, 4-5/10 pain. I took my dog for a walk and went shopping and prepped dinner.   Day after last session I was a little sore but overall better than I have been. I even considered riding my bike this morning but didn't have the time.   It is hard to engage the glutes but I am working on it.   Standing tolerance is 30 min.       Precautions: No data was found  Allergies: Angiotensin receptor blockers; Ciprofloxacin; Ace inhibitors; and Gabapentin                Treatment     Therapeutic Exercises   Justification: To improve strength/endurance/ROM as below with use of verbal/tactile cues by PT     Review/update of HEP, all of below with verbal/tactile cues:  - bw gait with focus on glute push off -- modified to doing at counter for safety at home with added TB resistance   - modified Thomas stretch for hip flexor, opposite knee at end range flexion       Neuromuscular Re-Education   Justification: To improve initiation of appropriate muscles with efficient timing using manual facilitation by PT     End range hold L hip extension in S/L  Billed with TE    Manual Therapy   To improve pelvic girdle mobility:    Prone  SF/BC sacrum, coccyx, SIJs  P/A mobs grade 3,4 of sacro-coccygeal joint with CR resisted hip IR and/or knee flexion   Inferior mobs grade 3,4 of sacrum with AROM knee flexion and/or LTR  Inferior  mobs grade 3,4 of innominate with AROM knee flexion and/or LTR    P/A prox femur on L   innom extension mob in S/L             ---      ---   Total Time   Timed Minutes  40 minutes   Total Time  40 minutes        Assessment   Pts L hip/innom extension with leg swing improved after MT above and pt able to complete above NMR and therex without c/o pain and able to engage glutes much better today than previous sessions. Updated HEP to maintain gains    Plan   Cont to improve hip extension ROM and strength       Goals    Goal 1:  .  Pt will demonstrate painfree hip flex MMT 5/5 bilaterally to allow for painfree community ambulation x 30 min     Post treatment: R 5/5 L 4+/5 vs 3/5 p! at IE  04/01/2019 MM   Sessions:  16   Progression:  progressing      Goal 2:  .  Pt  will report ability to sleep waking once nightly or less to reposition using positioning techniques to increase restful sleep and ease OOB transition in AM    Pt with improving again after recent bout of aggravation   04/22/2019 MM   Sessions:  6   Progression:  progressing       Goal 3:  .  Pt will demonstrate SLS x 10 sec bilaterally on level/unlevel surfaces to decrease fall risk      SLS  R 10 sec L 6 sec   03/31/2019 MM       Sessions:  16   Progression:  progressing      Goal 4:  .  Pt will demonstrate painfree hip flexion >/= 100 deg to allow pt to hip hinge squat with efficient mechanics for lifting 10 lbs from floor to waist     Improved to R 95 L 100 at end of session today  03/30/2019 MM   Sessions:  16   Progression:  progressing          Goal 5:    Patient will demonstrate independence in prescribed HEP 1x/day with proper form, sets and reps for safe discharge to an independent program.   See below for current HEP:  - seated bracing 30 sex 10x/day   - supine bridges  04/15/2019 MM     Sessions:  16   Progression:  progressing      Goal 6:  .  Pt will demonstrate increase in FOTO score by at least MDC to return to PLOF       FOTO score at IE = 45  MDC =  6  predictaed score at visit #13 = 54     Sessions:  16                      Maxxon Schwanke Alford Highland, PT

## 2019-04-24 ENCOUNTER — Inpatient Hospital Stay: Payer: Medicare Other

## 2019-04-24 DIAGNOSIS — M5441 Lumbago with sciatica, right side: Secondary | ICD-10-CM

## 2019-04-24 DIAGNOSIS — M25552 Pain in left hip: Secondary | ICD-10-CM

## 2019-04-24 NOTE — PT/OT Therapy Note (Signed)
Name: Kent Jordan Age: 68 y.o.   Date of Service: 04/24/2019  Referring Physician: Raleigh Callas, MD   Date of Injury: 03/03/2019  Date Care Plan Established/Reviewed: 03/30/2019  Date Treatment Started: 03/03/2019  End of Certification Date: 06/27/2019  Sessions in Plan of Care: 16  Surgery Date: No data was found    Visit Count: 10   Diagnosis:   1. Hip pain, acute, left    2. Acute bilateral low back pain with right-sided sciatica        Subjective     Social Support/Occupation  Lives in: multiple level home  Lives with: spouse  Occupation: retired    Pt was compliant with new HEP. Pt will try to ride bike this week. Still feels back pain and trouble standing longer than 35 mins. Pt has trouble climbing stairs at home, legs are hard for him to move.       Precautions: No data was found  Allergies: Angiotensin receptor blockers; Ciprofloxacin; Ace inhibitors; and Gabapentin                Treatment     Therapeutic Exercises   Justification: To improve strength/endurance/ROM as below with use of verbal/tactile cues by PT   Review/update of HEP, all of below with verbal/tactile cues:  Stair heel raises (5 bilaterally, 5 L, 5 R) x 2     Single Limb stance on foam 2 x 5 sec bilaterally (50%) assistance     Neuromuscular Re-Education   Justification: To improve initiation of appropriate muscles with efficient timing using manual facilitation by PT     Seated Weight acceptance training:  Pt sitting on pelvic floor at edge of plinth, staggered stance with front foot under knee:  Slow gradual traction to distal hamstring initiating hip rotators for hip stability  Then added increasing resistance to initiate quadriceps, followed by quad and glute co-contraction   Above repeated several time until pt immediately initiated proper quad/glute co-contraction needed for weight acceptance    Billed with TE     Manual Therapy   STM paraspinals, & QL    CPA's & UPA's L4-S1 (espically L5/S1 R facet)     Lumbar/Thoracic Traction  bilaterally      Therapeutic Activity   Justification: To improve efficiency with functional movement patterns below     Stair training in stair case:  Focus  on hip hinge forward lean, quad/glute co-contraction with ascending stairs  Slow controlled eccentric quad control for descending stairs   Above repeated multiple times, multiple flights of stairs        ---      ---   Total Time   Timed Minutes  40 minutes   Total Time  40 minutes        Assessment   Pt is progressing with HEP and co contraction of Quad and Glute. Lumbar flexion improved after manual therapy to L/S. Pt responded well to cueing during stair and balance training and demonstrated improvement in both.   Plan   Continue advancement of HEP, incorporate more balance training, stair training, and neuromuscular control of LE.       Goals    Goal 1:  .  Pt will demonstrate painfree hip flex MMT 5/5 bilaterally to allow for painfree community ambulation x 30 min     Post treatment: R 5/5 L 4+/5 vs 3/5 p! at IE  04/01/2019 MM   Sessions:  16   Progression:  progressing  Goal 2:  .  Pt will report ability to sleep waking once nightly or less to reposition using positioning techniques to increase restful sleep and ease OOB transition in AM    Pt with improving again after recent bout of aggravation   04/22/2019 MM   Sessions:  6   Progression:  progressing       Goal 3:  .  Pt will demonstrate SLS x 10 sec bilaterally on level/unlevel surfaces to decrease fall risk      SLS  R 10 sec L 6 sec   03/31/2019 MM       Sessions:  16   Progression:  progressing      Goal 4:  .  Pt will demonstrate painfree hip flexion >/= 100 deg to allow pt to hip hinge squat with efficient mechanics for lifting 10 lbs from floor to waist     Improved to R 95 L 100 at end of session today  03/30/2019 MM   Sessions:  16   Progression:  progressing          Goal 5:    Patient will demonstrate independence in prescribed HEP 1x/day with proper form, sets and reps for safe discharge to  an independent program.   See below for current HEP:  - seated bracing 30 sex 10x/day   - supine bridges  04/15/2019 MM     Sessions:  16   Progression:  progressing      Goal 6:  .  Pt will demonstrate increase in FOTO score by at least MDC to return to PLOF       FOTO score at IE = 45  MDC = 6  predictaed score at visit #13 = 54     Sessions:  16                      Adajah Cocking Alford Highland, PT

## 2019-05-01 ENCOUNTER — Inpatient Hospital Stay: Payer: Medicare Other | Attending: Physical Medicine & Rehabilitation

## 2019-05-01 DIAGNOSIS — M5441 Lumbago with sciatica, right side: Secondary | ICD-10-CM | POA: Insufficient documentation

## 2019-05-01 DIAGNOSIS — M25552 Pain in left hip: Secondary | ICD-10-CM | POA: Insufficient documentation

## 2019-05-01 NOTE — PT/OT Therapy Note (Signed)
Name: Kent Jordan Age: 68 y.o.   Date of Service: 05/01/2019  Referring Physician: Raleigh Callas, MD   Date of Injury: 03/03/2019  Date Care Plan Established/Reviewed: 03/30/2019  Date Treatment Started: 03/03/2019  End of Certification Date: 06/27/2019  Sessions in Plan of Care: 16  Surgery Date: No data was found    Visit Count: 11   Diagnosis:   1. Hip pain, acute, left    2. Acute bilateral low back pain with right-sided sciatica        Subjective     Social Support/Occupation  Lives in: multiple level home  Lives with: spouse  Occupation: retired    Pt is inflamed due to minor fall going up stairs. Pt reported muscle soreness after HEP but no pain.        Precautions: No data was found  Allergies: Angiotensin receptor blockers; Ciprofloxacin; Ace inhibitors; and Gabapentin    Objective   Hip flexion PROM  R 110  L 118    SLS  R 20 sec (vs 10 ec at IE)  L 30 sec (vs 6 sec at IE)             Treatment     Therapeutic Exercises   Justification: To improve strength/endurance/ROM as below with use of verbal/tactile cues by PT     Review/update of HEP, all of below with verbal/tactile cues:    See objective measures       Neuromuscular Re-Education   Justification: To improve initiation of appropriate muscles with efficient timing using manual facilitation by PT     Supine bracing / deep core tightening using verbal and tactile cues - progressed from brief contraction to maintained / prolonged hold while maintaining breathing, then progressed to LE marches while maintaining brace     Foam balance: SLS with with toe touch WBing 2x10 sec each LE (80% weight on stance leg)     Manual Therapy   Justification: To improve L/S mobility and decrease tone   S/L  Soft Tissue Massage of Lumbar paraspinals, QL, L/S Thoracic distraction  Lumbar spine gapping using single LE           ---      ---   Total Time   Timed Minutes  45 minutes   Total Time  45 minutes        Assessment   Pt was slightly inflamed due do a minor fall  while walking up the stairs. Today's session reduced his pain level and he was able tp perform his HEP and theraputic activity with minimal pain post treatment. Balance testing, hip mobility and FOTO scores have improved overall.   Plan   Continue balance training, core strengthening, and lumbar mobility.   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  Pt will demonstrate painfree hip flex MMT 5/5 bilaterally to allow for painfree community ambulation x 30 min     Post treatment: R 5/5 L 4+/5 vs 3/5 p! at IE  04/01/2019 MM   Sessions:  16   Progression:  progressing      Goal 2:  .  Pt will report ability to sleep waking once nightly or less to reposition using positioning techniques to increase restful sleep and ease OOB transition in AM    Pt does not wake up from pain currently, still struggles with supine to stand transfer  05/01/2019 MM  Sessions:  6   Progression:  progressing       Goal 3:  .  Pt will demonstrate SLS x 10 sec bilaterally on level/unlevel surfaces to decrease fall risk      SLS  R 20 sec (vs 10 sec at IE)  L 30 sec (vs 6 sec at IE)  05/01/2019 MM       Sessions:  16   Progression:  met      Goal 4:  .  Pt will demonstrate painfree hip flexion >/= 100 deg to allow pt to hip hinge squat with efficient mechanics for lifting 10 lbs from floor to waist     Hip flexion PROM  R 110  L 118  05/01/2019 MM   Sessions:  16   Progression:  met          Goal 5:    Patient will demonstrate independence in prescribed HEP 1x/day with proper form, sets and reps for safe discharge to an independent program.   See below for current HEP:  - seated bracing 30 sex 10x/day   - supine bridges  - core bracing with marches  - bilateral / unilateral HR  05/01/2019 MM     Sessions:  16   Progression:  progressing      Goal 6:  .  Pt will demonstrate increase in FOTO score by at least MDC to return to PLOF       FOTO score at IE = 45  MDC = 6  predictaed score at visit #13 = 54  FOTO score visit 11  (05/01/2019) = 61      Sessions:  16   Progression:  met                      Algis Greenhouse, PT

## 2019-05-02 ENCOUNTER — Encounter (INDEPENDENT_AMBULATORY_CARE_PROVIDER_SITE_OTHER): Payer: Self-pay

## 2019-05-04 ENCOUNTER — Inpatient Hospital Stay: Payer: Medicare Other

## 2019-05-04 DIAGNOSIS — M5441 Lumbago with sciatica, right side: Secondary | ICD-10-CM

## 2019-05-04 DIAGNOSIS — M25552 Pain in left hip: Secondary | ICD-10-CM

## 2019-05-04 NOTE — PT/OT Therapy Note (Signed)
Name: Kent Jordan Age: 68 y.o.   Date of Service: 05/04/2019  Referring Physician: Raleigh Callas, MD   Date of Injury: 03/03/2019  Date Care Plan Established/Reviewed: 03/30/2019  Date Treatment Started: 03/03/2019  End of Certification Date: 06/27/2019  Sessions in Plan of Care: 16  Surgery Date: No data was found    Visit Count: 12   Diagnosis:   1. Hip pain, acute, left    2. Acute bilateral low back pain with right-sided sciatica        Subjective     Social Support/Occupation  Lives in: multiple level home  Lives with: spouse  Occupation: retired    Pt hurt himself yesterday while working on his car while over reaching into lumbar flexion (7/10) pain took 30 mins to go away. He had no soreness after last session.       Precautions: No data was found  Allergies: Angiotensin receptor blockers; Ciprofloxacin; Ace inhibitors; and Gabapentin    Objective   AROM  lumbar spine flexion - able to touch toes but with wide stance of support and knees bent slightly, no increase in pain --> no change post treatment     Standing pivot to L -  Painful in R LB due to limited R hip ER mobility  --> prone R hip ER 10 deg prior femoral head translating posteriorly     Billed with TE             Treatment     Therapeutic Exercises   Justification: To improve strength/endurance/ROM as below with use of verbal/tactile cues by PT     Review/update of HEP, all of below with verbal/tactile cues:  - Standing SLS pushing opposite LE into wall with ER/abd      Neuromuscular Re-Education   Justification: To improve initiation of appropriate muscles with efficient timing using manual facilitation by PT     End range R hip ER PH for stabilization, then progressed to COI through full range or hip ER/IR, then reversals to improve NM/MC of hip    Foam balance SLS with 10% help from opposite foot touching 3x20 sec each LE      Manual Therapy   To improve ER hip on axis mobility     Prone   STM external rotators  Gr 4 P/A femoral head with CR  resisted hip IR to improve ER on axis    Lumbar Spine (L4-S1) CPA's/UPA's grade 3-4   Soft Tissue Massage of Lumbar paraspinals, QL, L/S Thoracic distraction             ---      ---   Total Time   Timed Minutes  40 minutes   Total Time  40 minutes        Assessment   R hip ER in prone improved from 10 to 30 deg which translates into improved pivoting to the L in standing, still limited with pivot to R (did not address L hip today). Pt slightly flared up from prone position so no change in lumbar flex AROM after MT. Pt VERY fatigued at end of session but no increase in pain  Plan   L/S treatment NOT in prone      Goals    Goal 1:  .  Pt will demonstrate painfree hip flex MMT 5/5 bilaterally to allow for painfree community ambulation x 30 min     Post treatment: R 5/5 L 4+/5 vs 3/5 p! at IE  04/01/2019  MM   Sessions:  16   Progression:  progressing      Goal 2:  .  Pt will report ability to sleep waking once nightly or less to reposition using positioning techniques to increase restful sleep and ease OOB transition in AM    Pt does not wake up from pain currently, still struggles with supine to stand transfer  05/01/2019 MM   Sessions:  6   Progression:  progressing       Goal 3:  .  Pt will demonstrate SLS x 10 sec bilaterally on level/unlevel surfaces to decrease fall risk      SLS  R 20 sec (vs 10 sec at IE)  L 30 sec (vs 6 sec at IE)  05/01/2019 MM       Sessions:  16   Progression:  met      Goal 4:  .  Pt will demonstrate painfree hip flexion >/= 100 deg to allow pt to hip hinge squat with efficient mechanics for lifting 10 lbs from floor to waist     Hip flexion PROM  R 110  L 118  05/01/2019 MM   Sessions:  16   Progression:  met          Goal 5:    Patient will demonstrate independence in prescribed HEP 1x/day with proper form, sets and reps for safe discharge to an independent program.   See below for current HEP:  - seated bracing 30 sex 10x/day   - supine bridges  - core bracing with marches  - bilateral /  unilateral HR  05/01/2019 MM     Sessions:  16   Progression:  progressing      Goal 6:  .  Pt will demonstrate increase in FOTO score by at least MDC to return to PLOF       FOTO score at IE = 45  MDC = 6  predictaed score at visit #13 = 54  FOTO score visit 11 (05/01/2019) = 61      Sessions:  16   Progression:  met                      Algis Greenhouse, PT

## 2019-05-06 ENCOUNTER — Inpatient Hospital Stay: Payer: Medicare Other

## 2019-05-06 DIAGNOSIS — M25552 Pain in left hip: Secondary | ICD-10-CM

## 2019-05-06 DIAGNOSIS — M5441 Lumbago with sciatica, right side: Secondary | ICD-10-CM

## 2019-05-06 NOTE — PT/OT Therapy Note (Signed)
Name: Kent Jordan Age: 68 y.o.   Date of Service: 05/06/2019  Referring Physician: Raleigh Callas, MD   Date of Injury: 03/03/2019  Date Care Plan Established/Reviewed: 03/30/2019  Date Treatment Started: 03/03/2019  End of Certification Date: 06/27/2019  Sessions in Plan of Care: 16  Surgery Date: No data was found    Visit Count: 13   Diagnosis:   1. Hip pain, acute, left    2. Acute bilateral low back pain with right-sided sciatica        Subjective     Social Support/Occupation  Lives in: multiple level home  Lives with: spouse  Occupation: retired    Pt feels great overall. Pt is going to start practicing golf balls. Pt still has trouble lifting L leg up when ascending stairs. He reports that he gets light headed when going from sit to stand.       Precautions: No data was found  Allergies: Angiotensin receptor blockers; Ciprofloxacin; Ace inhibitors; and Gabapentin    Objective   took BP due to c/o light headedness. (167/97) pulse 61             Treatment     Therapeutic Exercises   Justification: To improve strength/endurance/ROM as below with use of verbal/tactile cues by PT     Pt edu / discussion re: taking BP regularly, checking blood sugar daily, importance of HEP compliance     Recumbent bike 5 mins at level 2 resistance     Standing TB resisted rotation 2 sest of 10x each direction   Then progressed to TB resisted chop pattern 2 sets of 10x each direction     Review/update of HEP, all of below with verbal/tactile cues:  - SLS with wall support   - supine bridges  - core bracing with marches  - unilateral HR with wall support       Neuromuscular Re-Education   Justification: To improve balance / proprioception  Foam SLS with toe touch assist 2x30 sec each side with stand by assist   Progressed to SLS without assist 3x10 sec each       ---      ---   Total Time   Timed Minutes  40 minutes   Total Time  40 minutes        Assessment   Although pt progressing well overall he still demonstartes lack in HEP  compliance as well as neglecting other health issue such as HTN and blood sugar which was discussed in detail today. Pt was able to perform SLS on without support for the first time today. Pt agrees to address these issues and is motivated by prospect of getting back to golfing in order to motivate for HEP compliance.   Plan   Follow up on BP, blood sugar levels, HEP  Potential d/c after next session?      Goals    Goal 1:  .  Pt will demonstrate painfree hip flex MMT 5/5 bilaterally to allow for painfree community ambulation x 30 min     Post treatment: R 5/5 L 4+/5 vs 3/5 p! at IE  04/01/2019 MM   Sessions:  16   Progression:  progressing      Goal 2:  .  Pt will report ability to sleep waking once nightly or less to reposition using positioning techniques to increase restful sleep and ease OOB transition in AM    Pt does not wake up from pain currently, still struggles with  supine to stand transfer  05/01/2019 MM   Sessions:  6   Progression:  progressing       Goal 3:  .  Pt will demonstrate SLS x 10 sec bilaterally on level/unlevel surfaces to decrease fall risk      SLS  R 20 sec (vs 10 sec at IE)  L 30 sec (vs 6 sec at IE)  05/01/2019 MM       Sessions:  16   Progression:  met      Goal 4:  .  Pt will demonstrate painfree hip flexion >/= 100 deg to allow pt to hip hinge squat with efficient mechanics for lifting 10 lbs from floor to waist     Hip flexion PROM  R 110  L 118  05/01/2019 MM   Sessions:  16   Progression:  met          Goal 5:    Patient will demonstrate independence in prescribed HEP 1x/day with proper form, sets and reps for safe discharge to an independent program.   See below for current HEP:   - supine bridges  - core bracing with marches  - unilateral HR with wall support   - SLS with wall support  05/06/2019 MM     Sessions:  16   Progression:  progressing      Goal 6:  .  Pt will demonstrate increase in FOTO score by at least MDC to return to PLOF       FOTO score at IE = 45  MDC = 6  predictaed  score at visit #13 = 54  FOTO score visit 11 (05/01/2019) = 61      Sessions:  16   Progression:  met                      Algis Greenhouse, PT

## 2019-05-18 ENCOUNTER — Inpatient Hospital Stay: Payer: Medicare Other

## 2019-05-18 DIAGNOSIS — M25552 Pain in left hip: Secondary | ICD-10-CM

## 2019-05-18 DIAGNOSIS — M5441 Lumbago with sciatica, right side: Secondary | ICD-10-CM

## 2019-05-18 NOTE — Progress Notes (Signed)
Name:Kent Jordan Age: 68 y.o.   Date of Service: 05/18/2019  Referring Physician: Raleigh Callas, MD   Date of Injury: 03/03/2019  Date Care Plan Established/Reviewed: 03/30/2019  Date Treatment Started: 03/03/2019  End of Certification Date: 06/27/2019  Sessions in Plan of Care: 16  Surgery Date: No data was found      Visit Count: 14   Diagnosis:   1. Hip pain, acute, left    2. Acute bilateral low back pain with right-sided sciatica        Subjective     Social Support/Occupation  Lives in: multiple level home  Lives with: spouse  Occupation: retired    Feeling pretty good, a little pain in the back from sitting 8 hours yesterday pulling crabs. I do HEP every other day. I have to make an appointment for my HTN with my PCP it is still high. I have been checking my blood sugar and it is a little high in the mornings but I am monitoring it now.   Stairs are fine, walking is fine; maybe by the third trip of carrying groceries up stairs it aches a little in the back      Precautions: No data was found  Allergies: Angiotensin receptor blockers; Ciprofloxacin; Ace inhibitors; and Gabapentin    Past Medical History:   Diagnosis Date    Arrhythmia     when on ARB    Arthritis     psoriatic and osteoarthritis     Back pain     Blood disorder 11/2014    Hemochromatosis    Diabetes mellitus 2016    started metformin 05/2016 d/t A1C 7.1 - now checking BS 2x/day - average 70-110 in the last week - A1C on 06/29/16 = 6.3 , has also lost 16 lbs since 05/2016    Difficulty in walking(719.7)     Uses cane to ambulate    Elevated liver function tests     had abd u/s 07/05/16     Failed back syndrome     Fatty liver     Hemochromatosis     phlebotomy every 3 months - last done 02/2016    High cholesterol     Hypertensive disorder 06/2016    norvasc recently increased to 10 mg daily - now controlled on meds - most recent BP 117/73    Low back pain     Lung nodule 2012    benign - was monitored x  3 yrs by Dr. Bernita Raisin (Pul[facility]) -  signed off from pulmonology - no further f/u required since 2015    Neuropathy     Tingling left foot, numbness left toes    Psoriasis     Psoriatic arthritis     tx w/ enbrel    S/P insertion of spinal cord stimulator 2015    Sleep apnea     mild - does not have CPAP - was unable to tolerate, less snoring since turboplasty       Objective   MMT  Hip flex 5/5 bilaterally     Squat - full ROM knees to > 90 deg flexion, painfree    Seated trunk rot 45 deg bilaterally     Hip flexion PROM  R 120 L 130    Unilateral HR R 15 L 10    SLS   30 sec bilaterally              Treatment     Therapeutic Exercises   Justification:  To improve strength/endurance/ROM as below with use of verbal/tactile cues by PT     Review/update of HEP, all of below with verbal/tactile cues:  - supine bridges - progressed to one legged bridges  - core bracing with marches  - unilateral HR with wall support   - SLS with wall support    See objective measures     Manual Therapy   STM R prox thigh  IR hip on axis CR then NMR into new range          ---      ---   Total Time   Timed Minutes  40 minutes   Total Time  40 minutes        Assessment   Patient has been seen for 14 visits in this case from 03/03/2019 until 05/18/2019. Diagnoses of Hip pain, acute, left and Acute bilateral low back pain with right-sided sciatica were pertinent to this visit. Patient has made excellent  progress in pain, posture, range of motion and strength since the initial evaluation. Patients outcome measure has improved past predicted value since the initial evaluation. Reviewed a comprehensive discharge home exercise program with the patient today who is in agreement with discharge plan. Patient is ready for discharge from skilled therapy intervention at this time.       Plan   D/C to HEP      Goals    Goal 1:  .  Pt will demonstrate painfree hip flex MMT 5/5 bilaterally to allow for painfree community ambulation x 30 min     MMT  Hip flex 5/5 bilaterally   05/18/2019  MM   Sessions:  16   Progression:  met      Goal 2:  .  Pt will report ability to sleep waking once nightly or less to reposition using positioning techniques to increase restful sleep and ease OOB transition in AM    Met  05/18/2019 MM   Sessions:  6   Progression:  met       Goal 3:  .  Pt will demonstrate SLS x 10 sec bilaterally on level/unlevel surfaces to decrease fall risk       SLS  R 30 sec (vs 10 sec at IE)  L 30 sec (vs 6 sec at IE)  05/18/2019 MM       Sessions:  16   Progression:  met      Goal 4:  .  Pt will demonstrate painfree hip flexion >/= 100 deg to allow pt to hip hinge squat with efficient mechanics for lifting 10 lbs from floor to waist     Hip flexion PROM  R 120 L 130  painfree squat  05/18/2019 MM   Sessions:  16   Progression:  met          Goal 5:    Patient will demonstrate independence in prescribed HEP 1x/day with proper form, sets and reps for safe discharge to an independent program.   See below for current HEP:   - unilateral bridges  - core bracing with marches  - unilateral HR with wall support   - SLS with wall support  05/18/2019 MM     Sessions:  16   Progression:  met      Goal 6:  .  Pt will demonstrate increase in FOTO score by at least MDC to return to PLOF       FOTO score at IE = 45  MDC = 6  predictaed score at visit #13 = 54  FOTO score visit 11 (05/01/2019) = 61   FOTO score visit 14 (05/18/2019) = 65     Sessions:  16   Progression:  met                      Algis Greenhouse, PT

## 2019-05-18 NOTE — PT/OT Therapy Note (Signed)
Name: Kent Jordan Age: 68 y.o.   Date of Service: 05/18/2019  Referring Physician: Raleigh Callas, MD   Date of Injury: 03/03/2019  Date Care Plan Established/Reviewed: 03/30/2019  Date Treatment Started: 03/03/2019  End of Certification Date: 06/27/2019  Sessions in Plan of Care: 16  Surgery Date: No data was found    Visit Count: 14   Diagnosis:   1. Hip pain, acute, left    2. Acute bilateral low back pain with right-sided sciatica        Subjective     Social Support/Occupation  Lives in: multiple level home  Lives with: spouse  Occupation: retired      Precautions: No data was found  Allergies: Angiotensin receptor blockers; Ciprofloxacin; Ace inhibitors; and Gabapentin                           Goals    Goal 1:  .  Pt will demonstrate painfree hip flex MMT 5/5 bilaterally to allow for painfree community ambulation x 30 min     Post treatment: R 5/5 L 4+/5 vs 3/5 p! at IE  04/01/2019 MM   Sessions:  16   Progression:  progressing      Goal 2:  .  Pt will report ability to sleep waking once nightly or less to reposition using positioning techniques to increase restful sleep and ease OOB transition in AM    Pt does not wake up from pain currently, still struggles with supine to stand transfer  05/01/2019 MM   Sessions:  6   Progression:  progressing       Goal 3:  .  Pt will demonstrate SLS x 10 sec bilaterally on level/unlevel surfaces to decrease fall risk      SLS  R 20 sec (vs 10 sec at IE)  L 30 sec (vs 6 sec at IE)  05/01/2019 MM       Sessions:  16   Progression:  met      Goal 4:  .  Pt will demonstrate painfree hip flexion >/= 100 deg to allow pt to hip hinge squat with efficient mechanics for lifting 10 lbs from floor to waist     Hip flexion PROM  R 110  L 118  05/01/2019 MM   Sessions:  16   Progression:  met          Goal 5:    Patient will demonstrate independence in prescribed HEP 1x/day with proper form, sets and reps for safe discharge to an independent program.   See below for current HEP:   - supine  bridges  - core bracing with marches  - unilateral HR with wall support   - SLS with wall support  05/06/2019 MM     Sessions:  16   Progression:  progressing      Goal 6:  .  Pt will demonstrate increase in FOTO score by at least MDC to return to PLOF       FOTO score at IE = 45  MDC = 6  predictaed score at visit #13 = 54  FOTO score visit 11 (05/01/2019) = 61      Sessions:  16   Progression:  met                      Algis Greenhouse, PT

## 2019-05-20 ENCOUNTER — Inpatient Hospital Stay: Payer: Medicare Other

## 2019-05-25 ENCOUNTER — Inpatient Hospital Stay: Payer: Medicare Other

## 2019-06-02 ENCOUNTER — Encounter (INDEPENDENT_AMBULATORY_CARE_PROVIDER_SITE_OTHER): Payer: Self-pay

## 2019-06-03 ENCOUNTER — Encounter (INDEPENDENT_AMBULATORY_CARE_PROVIDER_SITE_OTHER): Payer: Self-pay

## 2019-07-01 ENCOUNTER — Ambulatory Visit (INDEPENDENT_AMBULATORY_CARE_PROVIDER_SITE_OTHER): Payer: Medicare Other

## 2019-07-01 VITALS — BP 150/76 | HR 68

## 2019-07-01 DIAGNOSIS — Z0130 Encounter for examination of blood pressure without abnormal findings: Secondary | ICD-10-CM

## 2019-07-02 ENCOUNTER — Encounter (INDEPENDENT_AMBULATORY_CARE_PROVIDER_SITE_OTHER): Payer: Self-pay

## 2019-07-04 ENCOUNTER — Other Ambulatory Visit (INDEPENDENT_AMBULATORY_CARE_PROVIDER_SITE_OTHER): Payer: Self-pay | Admitting: Student in an Organized Health Care Education/Training Program

## 2019-07-04 DIAGNOSIS — E119 Type 2 diabetes mellitus without complications: Secondary | ICD-10-CM

## 2019-07-06 ENCOUNTER — Encounter (INDEPENDENT_AMBULATORY_CARE_PROVIDER_SITE_OTHER): Payer: Self-pay | Admitting: Student in an Organized Health Care Education/Training Program

## 2019-07-06 ENCOUNTER — Telehealth (INDEPENDENT_AMBULATORY_CARE_PROVIDER_SITE_OTHER): Payer: Medicare Other | Admitting: Student in an Organized Health Care Education/Training Program

## 2019-07-06 DIAGNOSIS — B349 Viral infection, unspecified: Secondary | ICD-10-CM

## 2019-07-06 NOTE — Progress Notes (Signed)
TELEPHONE VISIT              CLINICAL SUMMARY        Verbal Consent      Verbal consent has been obtained from the patient to conduct a telephone visit encounter to minimize exposure to COVID-19: yes      Chief Complaint:      Chief Complaint   Patient presents with    Fever     x 2 day    Migraine    Cough     wet           Problem List, Medications, and Allergies reviewed: yes      HPI:    Cough  This is a new problem. Episode onset: 3 days. The problem has been unchanged. The cough is productive of sputum. Associated symptoms include a fever (Low-grade), myalgias and nasal congestion. Pertinent negatives include no shortness of breath. Nothing aggravates the symptoms. Treatments tried: Tylenol. The treatment provided no relief. There is no history of asthma or COPD.     Denies COVID-19 contact, has been to grocery store in his dentist        Assessment/Plan:      1. Acute viral syndrome  Coronavirus, COVID-19    Rapid influenza A/B antigens         - moderate URI symptoms, r/o COVID19, reviewed home isolation instructions, reviewed ED precautions for respiratory distress  - Fluids, rest, Tylenol as needed  - Decongestants and/or antitussives as needed  - Viral URI may take up to 2-3wks to run the course, consider abx if persistent     Time spent in medical discussion: 11 to 20 minutes    Erasmo Score, MD

## 2019-07-06 NOTE — Progress Notes (Signed)
Have you seen any specialists/other providers since your last visit with [place]?    No    Arm preference verified?   No    The patient is due for spirometry, influenza vaccine, shingles vaccine and pcmh,mawv

## 2019-07-06 NOTE — Progress Notes (Deleted)
Patient ID: Kent Jordan  is a 68 y.o.  male.     Verbal consent has been obtained from the patient to conduct a video visit encounter to minimize exposure to COVID-19:  Yes     Chief Complaint   Patient presents with    Fever     x 2 day    Migraine    Cough     wet      Cough  This is a new problem.     Dentist    The following portions of the patient's history were reviewed and updated as appropriate: current medications, allergies, past family history, past medical history, past social history, past surgical history and problem list.     Review of Systems   Respiratory: Positive for cough.           OBJECTIVE     Physical exam limited due to pandemic    There were no vitals taken for this visit.    Physical Exam    GENERAL : alert and in no distress   HEENT: ncat  LUNGS: unlabored breathing  PSYCH: Normal mood and affect, linear thought process, appropriate interactions, no SI/hallucinations      ASSESSMENT      Kent Jordan is a 68 y.o. male with No diagnosis found.       PLAN     I spent *** mins during this encounter discussing/counseling/coordinating care for the following medical problem(s)     ***    Call if symptoms persist, worsen, or change.  Call with updates/questions/concerns.    F/u PRN    Medication list reviewed with patient and updated as indicated.  Risk & Benefits of any new medication(s) were explained to the patient who verbalized understanding & agreed to the treatment plan.  Patient given a printed copy of AVS. Patient verbalized understanding of instructions given.

## 2019-07-07 ENCOUNTER — Ambulatory Visit (FREE_STANDING_LABORATORY_FACILITY): Payer: Medicare Other

## 2019-07-07 DIAGNOSIS — Z20822 Contact with and (suspected) exposure to covid-19: Secondary | ICD-10-CM

## 2019-07-07 DIAGNOSIS — Z20828 Contact with and (suspected) exposure to other viral communicable diseases: Secondary | ICD-10-CM

## 2019-07-08 LAB — COVID-19 (SARS-COV-2)(SOFT): SARS CoV 2 Overall Result: NOT DETECTED

## 2019-07-14 ENCOUNTER — Encounter (INDEPENDENT_AMBULATORY_CARE_PROVIDER_SITE_OTHER): Payer: Self-pay | Admitting: Student in an Organized Health Care Education/Training Program

## 2019-07-14 ENCOUNTER — Telehealth (INDEPENDENT_AMBULATORY_CARE_PROVIDER_SITE_OTHER): Payer: Medicare Other | Admitting: Student in an Organized Health Care Education/Training Program

## 2019-07-14 DIAGNOSIS — J0110 Acute frontal sinusitis, unspecified: Secondary | ICD-10-CM

## 2019-07-14 DIAGNOSIS — R7989 Other specified abnormal findings of blood chemistry: Secondary | ICD-10-CM

## 2019-07-14 DIAGNOSIS — E119 Type 2 diabetes mellitus without complications: Secondary | ICD-10-CM

## 2019-07-14 DIAGNOSIS — E1140 Type 2 diabetes mellitus with diabetic neuropathy, unspecified: Secondary | ICD-10-CM | POA: Insufficient documentation

## 2019-07-14 MED ORDER — AMOXICILLIN-POT CLAVULANATE 875-125 MG PO TABS
1.00 | ORAL_TABLET | Freq: Two times a day (BID) | ORAL | 0 refills | Status: AC
Start: 2019-07-14 — End: 2019-07-24

## 2019-07-14 NOTE — Progress Notes (Signed)
Have you seen any specialists/other providers since your last visit with [place]?    No    Arm preference verified?   No    The patient is due for colonoscopy, spirometry, influenza vaccine, shingles vaccine and pcmh, MAWV

## 2019-07-14 NOTE — Progress Notes (Signed)
Patient ID: Kent Jordan  is a 68 y.o.  male.     Verbal consent has been obtained from the patient to conduct a video visit encounter to minimize exposure to COVID-19:  Yes     Chief Complaint   Patient presents with    Headache     NO FEVER    Fatigue    Sinus Problem      Sinus Problem  This is a new problem. The current episode started 1 to 4 weeks ago. The problem is unchanged. There has been no fever. Associated symptoms include congestion, coughing and sinus pressure. Past treatments include nothing.   Negative COVID-19 test    Problem   Elevated Lfts    Sees GI and hematology, h/o hemochromatosis, was on Zetia+Crestor, will stop Zetia, avoid EtOH, negative hepatitis screen        Lab Results   Component Value Date    ALT 124 (H) 02/26/2019    AST 85 (H) 02/26/2019    GGT 157 (H) 03/12/2018    ALKPHOS 125 (H) 02/26/2019    BILITOTAL 0.6 02/26/2019         Type 2 Diabetes Mellitus Without Complication, Without Long-Term Current Use of Insulin    Decreased adherence to diet exercise during pandemic  Medication(s): Metformin 500mg  daily  Side effects: none  Hypoglycemic episodes: none  Neuropathic Sx: no, follows with podiatry  Eye exam in the past year: Jan 2019, no retinopathy, follows with Dr. Jon Billings  PNA vaccine:~2016  Last urine microalbumin: normal June 2018    Review of labs showed:  Lab Results   Component Value Date    HGBA1C 5.2 02/28/2018    HGBA1C 5.1 03/12/2017    HGBA1C 6.3 (H) 06/29/2016          Diabetes Mellitus With Neuropathy (Resolved)        The following portions of the patient's history were reviewed and updated as appropriate: current medications, allergies, past family history, past medical history, past social history, past surgical history and problem list.     Review of Systems   HENT: Positive for congestion and sinus pressure.    Respiratory: Positive for cough.       See HPI    OBJECTIVE     Physical exam limited due to pandemic    There were no vitals taken for this  visit.    Physical Exam    GENERAL : alert and in no distress   HEENT: ncat, frontal sinus pressure, nasal drainage  LUNGS: unlabored breathing  PSYCH: Normal mood and affect, linear thought process, appropriate interactions, no SI/hallucinations      ASSESSMENT      [name] is a 68 y.o. male with   1. Acute non-recurrent frontal sinusitis  amoxicillin-clavulanate (AUGMENTIN) 875-125 MG per tablet   2. Type 2 diabetes mellitus without complication, without long-term current use of insulin  Hemoglobin A1C    Comprehensive metabolic panel    Urine Microalbumin Random   3. Elevated LFTs            PLAN     I spent 25 mins during this encounter discussing/counseling/coordinating care for the following medical problem(s)     1. Acute non-recurrent frontal sinusitis  - encourage Claritin/Flonase/Nasal saline flush/humidifier; will trx with course of abx d/t duration/severity/progression of symptoms   - amoxicillin-clavulanate (AUGMENTIN) 875-125 MG per tablet; Take 1 tablet by mouth 2 (two) times daily for 10 days  Dispense: 20 tablet; Refill:  0    2. Type 2 diabetes mellitus without complication, without long-term current use of insulin  - Goal A1c < 6.5, ?controlled, encourage diabetic diet and regular exercise, continue current regimen   - Hemoglobin A1C; Future  - Comprehensive metabolic panel; Future  - Urine Microalbumin Random; Future    3. Elevated LFTs  -Avoid hepatotoxins, LFT monitoring      Call if symptoms persist, worsen, or change.  Call with updates/questions/concerns.    F/u PRN    Medication list reviewed with patient and updated as indicated.  Risk & Benefits of any new medication(s) were explained to the patient who verbalized understanding & agreed to the treatment plan.  Patient given a printed copy of AVS. Patient verbalized understanding of instructions given.

## 2019-07-15 ENCOUNTER — Encounter (INDEPENDENT_AMBULATORY_CARE_PROVIDER_SITE_OTHER): Payer: Self-pay

## 2019-07-15 ENCOUNTER — Other Ambulatory Visit (FREE_STANDING_LABORATORY_FACILITY): Payer: Medicare Other

## 2019-07-15 DIAGNOSIS — E119 Type 2 diabetes mellitus without complications: Secondary | ICD-10-CM

## 2019-07-15 LAB — COMPREHENSIVE METABOLIC PANEL
ALT: 101 U/L — ABNORMAL HIGH (ref 0–55)
AST (SGOT): 61 U/L — ABNORMAL HIGH (ref 5–34)
Albumin/Globulin Ratio: 1.3 (ref 0.9–2.2)
Albumin: 4.2 g/dL (ref 3.5–5.0)
Alkaline Phosphatase: 105 U/L (ref 38–106)
Anion Gap: 13 (ref 5.0–15.0)
BUN: 11 mg/dL (ref 9.0–28.0)
Bilirubin, Total: 0.9 mg/dL (ref 0.2–1.2)
CO2: 25 mEq/L (ref 21–29)
Calcium: 9.4 mg/dL (ref 8.5–10.5)
Chloride: 99 mEq/L — ABNORMAL LOW (ref 100–111)
Creatinine: 1 mg/dL (ref 0.5–1.5)
Globulin: 3.2 g/dL (ref 2.0–3.7)
Glucose: 162 mg/dL — ABNORMAL HIGH (ref 70–100)
Potassium: 4 mEq/L (ref 3.5–5.1)
Protein, Total: 7.4 g/dL (ref 6.0–8.3)
Sodium: 137 mEq/L (ref 136–145)

## 2019-07-15 LAB — HEMOGLOBIN A1C
Average Estimated Glucose: 151.3 mg/dL
Hemoglobin A1C: 6.9 % — ABNORMAL HIGH (ref 4.6–5.9)

## 2019-07-15 LAB — MICROALBUMIN, RANDOM URINE
Urine Creatinine, Random: 85.5 mg/dL
Urine Microalbumin, Random: 19 (ref 0.0–30.0)
Urine Microalbumin/Creatinine Ratio: 22 ug/mg (ref 0–30)

## 2019-07-15 LAB — GFR: EGFR: >60

## 2019-07-15 LAB — HEMOLYSIS INDEX: Hemolysis Index: 13 (ref 0–18)

## 2019-07-17 ENCOUNTER — Encounter (INDEPENDENT_AMBULATORY_CARE_PROVIDER_SITE_OTHER): Payer: Self-pay | Admitting: "Endocrinology

## 2019-07-17 ENCOUNTER — Telehealth (INDEPENDENT_AMBULATORY_CARE_PROVIDER_SITE_OTHER): Payer: Self-pay | Admitting: "Endocrinology

## 2019-07-17 ENCOUNTER — Telehealth (INDEPENDENT_AMBULATORY_CARE_PROVIDER_SITE_OTHER): Payer: Medicare Other | Admitting: "Endocrinology

## 2019-07-17 VITALS — BP 143/87 | HR 63 | Ht 71.0 in | Wt 204.0 lb

## 2019-07-17 DIAGNOSIS — E119 Type 2 diabetes mellitus without complications: Secondary | ICD-10-CM

## 2019-07-17 NOTE — Progress Notes (Signed)
This is a video visit due to the COVID-19 pandemic.  Verbal consent has been obtained from the patient to conduct a video visit to minimize exposure to COVID-19.      Chief Complaint:  Chief Complaint   Patient presents with    Diabetes       HPI:  Kent Jordan is a 68 y.o. male with hemochromatosis, type 2 diabetes, hypertension, hyperlipidemia, psoriatic arthritis, who presents for initial evaluation and management of type 2 diabetes.  Diabetes type 2 was diagnosed in 2017 on routine testing, and has been managed by PCP.    Kent Jordan does not have a history of DKA, no hospitalizations for hyperglycemia or hypoglycemia, and no history of pancreatitis.    Has not been adhering to diet and exercise during pandemic.  Has gained the 30lbs that he lost back.  He does not have a family history of type 2 diabetes.  He reports a sinus infection x 2 weeks.  He reports persistent headache, fatigue, congestion.    Diabetes Medications:  Metformin 500mg  XR daily    Previous Medications:    Diabetes Complications:  Podiatry: + neuropathy sx's in L foot after L3-S1 fusion in 2013  Renal:   Lab Results   Component Value Date    CREAT 1.0 07/15/2019    CREAT 0.9 02/26/2019   , microalbumin neg (07/15/19)   Ophthalmology: no retinopathy, no glaucoma, + cataracts, last visit 2 years ago  Cardiovascular: none  Lipids: TC 159, trig 140, LDL 87, HDL 44 (02/26/19)  Blood pressure: yes    Self-monitoring blood glucose: glucometer    Checks Blood sugars: 1-2 times daily  Fasting: 153, 188, 175, 173  Before Lunch: 153, 177  Before Dinner: 152  Bedtime:  After meals: 178 (1 hr after breakfast)  Hypoglycemia:     24 Hour diet recall:  1) Breakfast: 2 egg omelet, 2 strips bacon, hash browns (1 large potato),   2) Lunch: brown rice, protein  3) Dinner: pizza  Snacks: chocolate (candy)  Beverages: beers    Exercise: limited by arthritis pain, soreness      Labs:  Lab Results   Component Value Date    HGBA1C 6.9 (H) 07/15/2019    HGBA1C  5.2 02/28/2018    HGBA1C 5.1 03/12/2017    A1c 6.9 05/26/19      Problem List:  Patient Active Problem List   Diagnosis    Mixed hyperlipidemia    Essential hypertension    Bigeminal rhythm    Arrhythmia    Primary osteoarthritis of right knee    Status post right knee replacement    Type 2 diabetes mellitus without complication, without long-term current use of insulin    Other hemochromatosis    Acute bilateral low back pain with sciatica, sciatica laterality unspecified    Anterior knee pain, right    Overweight (BMI 25.0-29.9)    Elevated LFTs    Psoriatic arthritis       Current Medications:  Current Outpatient Medications on File Prior to Visit   Medication Sig Dispense Refill    amLODIPine (NORVASC) 10 MG tablet Take 10 mg by mouth daily         amoxicillin-clavulanate (AUGMENTIN) 875-125 MG per tablet Take 1 tablet by mouth 2 (two) times daily for 10 days 20 tablet 0    aspirin 81 MG tablet Take 81 mg by mouth daily.      celecoxib (CeleBREX) 200 MG capsule Take 200 mg by  mouth 2 (two) times daily      DULoxetine (CYMBALTA) 60 MG capsule Take 60 mg by mouth daily.      folic acid (FOLVITE) 400 MCG tablet Take 400 mcg by mouth daily.      hydroCHLOROthiazide (HYDRODIURIL) 25 MG tablet Take 25 mg by mouth daily.         metFORMIN (GLUCOPHAGE-XR) 500 MG 24 hr tablet TAKE 1 TABLET (500 MG TOTAL) BY MOUTH EVERY MORNING WITH BREAKFAST 90 tablet 0    metoprolol succinate XL (TOPROL-XL) 25 MG 24 hr tablet Take 25 mg by mouth daily     3    Omega-3 Fatty Acids (Omega-3 Fish Oil) 500 MG Cap Take 1,000 mg by mouth daily      rosuvastatin (CRESTOR) 5 MG tablet Take 1 tablet (5 mg total) by mouth daily 90 tablet 1    Secukinumab, 300 MG Dose, (Cosentyx, 300 MG Dose,) 150 MG/ML Solution Prefilled Syringe Inject into the skin      traZODone (DESYREL) 50 MG tablet Take 50 mg by mouth nightly as needed.          ibuprofen (ADVIL,MOTRIN) 600 MG tablet Take 1 tablet (600 mg total) by mouth every 8  (eight) hours as needed for Pain. 30 tablet 0     No current facility-administered medications on file prior to visit.        Allergies:  Allergies   Allergen Reactions    Angiotensin Receptor Blockers Other (See Comments)     ARB combined with Celebrex caused Arrhythmia due to high K+    Ciprofloxacin Swelling and Anaphylaxis     swelling    Ace Inhibitors Angioedema    Gabapentin Other (See Comments)     blurred vision, dizziness       Past Medical History:  Past Medical History:   Diagnosis Date    Arrhythmia     when on ARB    Arthritis     psoriatic and osteoarthritis     Back pain     Blood disorder 11/2014    Hemochromatosis    Diabetes mellitus 2016    started metformin 05/2016 d/t A1C 7.1 - now checking BS 2x/day - average 70-110 in the last week - A1C on 06/29/16 = 6.3 , has also lost 16 lbs since 05/2016    Difficulty in walking(719.7)     Uses cane to ambulate    Elevated liver function tests     had abd u/s 07/05/16     Failed back syndrome     Fatty liver     Hemochromatosis     phlebotomy every 3 months - last done 02/2016    Hemochromatosis 09/2014    High cholesterol     Hypertensive disorder 06/2016    norvasc recently increased to 10 mg daily - now controlled on meds - most recent BP 117/73    Low back pain     Lung nodule 2012    benign - was monitored x  3 yrs by Dr. Bernita Raisin (Pul[facility]) - signed off from pulmonology - no further f/u required since 2015    Neuropathy     Tingling left foot, numbness left toes    Psoriasis     Psoriatic arthritis     tx w/ enbrel    S/P insertion of spinal cord stimulator 2015    Sleep apnea     mild - does not have CPAP - was unable to tolerate, less snoring since turboplasty  Type 2 diabetes mellitus, controlled        Past Surgical History:  Past Surgical History:   Procedure Laterality Date    ABDOMINAL SURGERY  03/1971    double hernia repair    ARTHROPLASTY, KNEE, TOTAL Right 08/20/2016    Procedure: ARTHROPLASTY, KNEE, TOTAL;  Surgeon:  Harriet Masson, MD;  Location: [facility];  Service: Orthopedics;  Laterality: Right;  RIGHT TOTAL KNEE REPLACEMENT    ARTHROSCOPY, KNEE Right 03/21/2016    Procedure: ARTHROSCOPY, KNEE, RIGHT, PARTIAL MEDIAL MENISCECTOMY, PARTIAL SYNOVECTOMY;  Surgeon: Verner Chol, MD;  Location: [facility];  Service: Orthopedics;  Laterality: Right;    CATARACT EXT.WITH IOL Bilateral 2009    FRACTURE SURGERY  1993    right middle finger    INSERTION, SPINAL CORD STIMULATOR GENERATOR N/A 05/07/2014    Procedure: DORSAL COLUMN STIMULATOR PLACEMENT;  Surgeon: Tera Helper, MD;  Location: [facility];  Service: Neurosurgery;  Laterality: N/A;  DCS PLACEMENT    KNEE ARTHROCENTESIS  1983    right    LUMBAR FUSION  10/2011    MICRODISCECTOMY LUMBAR  06/2011, 07/2011    SEPTOPLASTY  10/05/2015    SKIN BIOPSY  11/2015    Negative       Family History:  Family History   Problem Relation Age of Onset    Heart disease Mother     Heart disease Father        Social History:  Social History     Socioeconomic History    Marital status: Married     Spouse name: Not on file    Number of children: Not on file    Years of education: Not on file    Highest education level: Not on file   Occupational History    Not on file   Social Needs    Financial resource strain: Not on file    Food insecurity     Worry: Not on file     Inability: Not on file    Transportation needs     Medical: Not on file     Non-medical: Not on file   Tobacco Use    Smoking status: Former Smoker     Packs/day: 1.00     Years: 41.00     Pack years: 41.00     Quit date: 09/26/2011     Years since quitting: 7.8    Smokeless tobacco: Never Used   Substance and Sexual Activity    Alcohol use: Yes     Alcohol/week: 2.0 standard drinks     Types: 4 Cans of beer per week    Drug use: No    Sexual activity: Yes     Partners: Female     Birth control/protection: Surgical   Lifestyle    Physical activity     Days per week: Not on file     Minutes  per session: Not on file    Stress: Not on file   Relationships    Social connections     Talks on phone: Not on file     Gets together: Not on file     Attends religious service: Not on file     Active member of club or organization: Not on file     Attends meetings of clubs or organizations: Not on file     Relationship status: Not on file    Intimate partner violence     Fear of current or ex partner:  Not on file     Emotionally abused: Not on file     Physically abused: Not on file     Forced sexual activity: Not on file   Other Topics Concern    Not on file   Social History Narrative    Not on file       ROS:   General: Denies weight gain or weight loss, Denies fatigue  Ophthalmologic: Denies blurry vision, double vision, eye pain  ENT: Denies difficulty swallowing, voice changes  Endocrine: Denies polyuria, increased thirst  Respiratory: Denies cough, shortness of breath   Cardiovascular: Denies palpitations, chest pain, chest pain with exertion, leg claudication, leg edema  Gastrointestinal: Denies abdominal pain, constipation, diarrhea, nausea, vomiting  Genitourinary: Denies frequent urination, painful urination, denies yeast infection  Musculoskeletal: Denies back pain, joint pain  Skin: Denies rash  Neurologic: Denies tingling, numbness of lower extremities and upper extremities  Psychiatric: Denies anxiety, depressed mood, hypersomnolence, insomnia      Visit Vitals  BP 143/87   Pulse 63   Ht 1.803 m (5\' 11" )   Wt 92.5 kg (204 lb)   BMI 28.45 kg/m        BP Readings from Last 3 Encounters:   07/17/19 143/87   07/01/19 150/76   03/25/19 178/68        Wt Readings from Last 4 Encounters:   07/17/19 0902 92.5 kg (204 lb)   04/09/19 1035 93.9 kg (207 lb)   03/25/19 1142 94.3 kg (208 lb)   03/20/19 0948 94.3 kg (208 lb)       Physical Exam:  GENERAL APPEARANCE: alert, in no acute distress, well developed, well nourished  HEENT:  no proptosis, no scleral icterus, no conjunctival erythema  PSYCHIATRIC:  normal mood, appropriate affect        Assessment/Plan:  PEGGY MOHIUDDIN is a 68 y.o. male with    1. Type 2 diabetes mellitus without complication, without long-term current use of insulin      I provided the patient information on carbohydrate content of foods, instructed to maintain a low carbohydrate diet of 30-45g/meal, + 30g for snacks /day, and to maximize foods high in protein and vegetables.  I discussed with the patient blood glucose targets, HgbA1c goals, and the importance of timing of SMBGs at home.  I discussed medication treatment options.  A total of 30 minutes were spent with the patient, of which greater than 50% of the time was spent counseling the patient on the above and coordinating care.   SMBGs are slightly above target, A1c still at target, but higher than it was in the past.     1. Type 2 diabetes mellitus without complication, without long-term current use of insulin  - Lifestyle changes  - Continue metformin 500mg  XR daily, will consider increasing dosage if BGs are not at target at follow up visit  - Continue SMBGs       Orders Placed This Encounter    Omega-3 Fatty Acids (Omega-3 Fish Oil) 500 MG Cap        Medications Discontinued During This Encounter   Medication Reason    Cosentyx Sensoready, 300 MG, 150 MG/ML Solution Auto-injector Therapy completed    lidocaine (LIDODERM) 5 % Therapy completed    FREESTYLE LITE test strip Therapy completed    Lancets (FREESTYLE) lancets Therapy completed        Return in about 6 weeks (around 08/28/2019).    Zerita Boers, MD

## 2019-07-17 NOTE — Telephone Encounter (Signed)
Glucose log    10/23      9am       153  10/22     11:06        153  10/15     4:57pm     152                10:41am   178    10/10     9:39am       188   10/4      9:37am       175    9/30      11:49am       177  9/22     9:10am           173

## 2019-07-27 ENCOUNTER — Telehealth (INDEPENDENT_AMBULATORY_CARE_PROVIDER_SITE_OTHER): Payer: Medicare Other | Admitting: Student in an Organized Health Care Education/Training Program

## 2019-07-27 ENCOUNTER — Emergency Department
Admission: EM | Admit: 2019-07-27 | Discharge: 2019-07-27 | Disposition: A | Discharge status: Home / Self Care | Payer: Medicare Other | Attending: Emergency Medicine | Admitting: Emergency Medicine

## 2019-07-27 ENCOUNTER — Emergency Department: Payer: Medicare Other

## 2019-07-27 ENCOUNTER — Encounter (INDEPENDENT_AMBULATORY_CARE_PROVIDER_SITE_OTHER): Payer: Self-pay | Admitting: Student in an Organized Health Care Education/Training Program

## 2019-07-27 DIAGNOSIS — Z7982 Long term (current) use of aspirin: Secondary | ICD-10-CM | POA: Insufficient documentation

## 2019-07-27 DIAGNOSIS — E860 Dehydration: Secondary | ICD-10-CM | POA: Insufficient documentation

## 2019-07-27 DIAGNOSIS — Z20828 Contact with and (suspected) exposure to other viral communicable diseases: Secondary | ICD-10-CM | POA: Insufficient documentation

## 2019-07-27 DIAGNOSIS — R079 Chest pain, unspecified: Secondary | ICD-10-CM

## 2019-07-27 DIAGNOSIS — J0110 Acute frontal sinusitis, unspecified: Secondary | ICD-10-CM

## 2019-07-27 DIAGNOSIS — I10 Essential (primary) hypertension: Secondary | ICD-10-CM | POA: Insufficient documentation

## 2019-07-27 DIAGNOSIS — R05 Cough: Secondary | ICD-10-CM

## 2019-07-27 DIAGNOSIS — Z20822 Contact with and (suspected) exposure to covid-19: Secondary | ICD-10-CM

## 2019-07-27 DIAGNOSIS — Z7984 Long term (current) use of oral hypoglycemic drugs: Secondary | ICD-10-CM | POA: Insufficient documentation

## 2019-07-27 DIAGNOSIS — Z87891 Personal history of nicotine dependence: Secondary | ICD-10-CM | POA: Insufficient documentation

## 2019-07-27 DIAGNOSIS — E7800 Pure hypercholesterolemia, unspecified: Secondary | ICD-10-CM | POA: Insufficient documentation

## 2019-07-27 DIAGNOSIS — R059 Cough, unspecified: Secondary | ICD-10-CM

## 2019-07-27 DIAGNOSIS — G4489 Other headache syndrome: Secondary | ICD-10-CM | POA: Insufficient documentation

## 2019-07-27 DIAGNOSIS — E1140 Type 2 diabetes mellitus with diabetic neuropathy, unspecified: Secondary | ICD-10-CM | POA: Insufficient documentation

## 2019-07-27 LAB — CBC AND DIFFERENTIAL
Absolute NRBC: 0 10*3/uL (ref 0.00–0.00)
Basophils Absolute Automated: 0.09 10*3/uL — ABNORMAL HIGH (ref 0.00–0.08)
Basophils Automated: 0.8 %
Eosinophils Absolute Automated: 0.04 10*3/uL (ref 0.00–0.44)
Eosinophils Automated: 0.4 %
Hematocrit: 38.7 % (ref 37.6–49.6)
Hgb: 13.3 g/dL (ref 12.5–17.1)
Immature Granulocytes Absolute: 0.12 10*3/uL — ABNORMAL HIGH (ref 0.00–0.07)
Immature Granulocytes: 1.1 %
Lymphocytes Absolute Automated: 2.2 10*3/uL (ref 0.42–3.22)
Lymphocytes Automated: 19.5 %
MCH: 32.7 pg (ref 25.1–33.5)
MCHC: 34.4 g/dL (ref 31.5–35.8)
MCV: 95.1 fL (ref 78.0–96.0)
MPV: 9.8 fL (ref 8.9–12.5)
Monocytes Absolute Automated: 0.91 10*3/uL — ABNORMAL HIGH (ref 0.21–0.85)
Monocytes: 8.1 %
Neutrophils Absolute: 7.92 10*3/uL — ABNORMAL HIGH (ref 1.10–6.33)
Neutrophils: 70.1 %
Nucleated RBC: 0 /100 WBC (ref 0.0–0.0)
Platelets: 283 10*3/uL (ref 142–346)
RBC: 4.07 10*6/uL — ABNORMAL LOW (ref 4.20–5.90)
RDW: 12 % (ref 11–15)
WBC: 11.28 10*3/uL — ABNORMAL HIGH (ref 3.10–9.50)

## 2019-07-27 LAB — URINALYSIS, REFLEX TO MICROSCOPIC EXAM IF INDICATED
Bilirubin, UA: NEGATIVE
Blood, UA: NEGATIVE
Glucose, UA: 500 — AB
Ketones UA: >=80 — AB
Leukocyte Esterase, UA: NEGATIVE
Nitrite, UA: NEGATIVE
Protein, UR: NEGATIVE
Specific Gravity UA: 1.02 (ref 1.001–1.035)
Urine pH: 5.5 (ref 5.0–8.0)
Urobilinogen, UA: 0.2 mg/dL (ref 0.2–2.0)

## 2019-07-27 LAB — COMPREHENSIVE METABOLIC PANEL
ALT: 75 U/L — ABNORMAL HIGH (ref 0–55)
AST (SGOT): 33 U/L (ref 5–34)
Albumin/Globulin Ratio: 1.4 (ref 0.9–2.2)
Albumin: 3.7 g/dL (ref 3.5–5.0)
Alkaline Phosphatase: 88 U/L (ref 38–106)
Anion Gap: 12 (ref 5.0–15.0)
BUN: 41 mg/dL — ABNORMAL HIGH (ref 9.0–28.0)
Bilirubin, Total: 0.6 mg/dL (ref 0.2–1.2)
CO2: 24 mEq/L (ref 22–29)
Calcium: 9.4 mg/dL (ref 8.5–10.5)
Chloride: 103 mEq/L (ref 100–111)
Creatinine: 1 mg/dL (ref 0.7–1.3)
Globulin: 2.7 g/dL (ref 2.0–3.6)
Glucose: 300 mg/dL — ABNORMAL HIGH (ref 70–100)
Potassium: 4.9 mEq/L (ref 3.5–5.1)
Protein, Total: 6.4 g/dL (ref 6.0–8.3)
Sodium: 139 mEq/L (ref 136–145)

## 2019-07-27 LAB — TROPONIN I: Troponin I: 0.03 ng/mL (ref 0.00–0.05)

## 2019-07-27 LAB — GFR: EGFR: >60

## 2019-07-27 LAB — GLUCOSE WHOLE BLOOD - POCT: Whole Blood Glucose POCT: 276 mg/dL — ABNORMAL HIGH (ref 70–100)

## 2019-07-27 MED ORDER — ACETAMINOPHEN 325 MG PO TABS
650.0000 mg | ORAL_TABLET | Freq: Once | ORAL | Status: AC
Start: 2019-07-27 — End: 2019-07-27
  Administered 2019-07-27: 21:00:00 650 mg via ORAL
  Filled 2019-07-27: qty 2

## 2019-07-27 MED ORDER — ONDANSETRON 4 MG PO TBDP
4.00 mg | ORAL_TABLET | Freq: Four times a day (QID) | ORAL | 0 refills | Status: DC | PRN
Start: 2019-07-27 — End: 2019-12-01

## 2019-07-27 MED ORDER — AZITHROMYCIN 250 MG PO TABS
ORAL_TABLET | ORAL | 0 refills | Status: DC
Start: 2019-07-27 — End: 2019-08-06

## 2019-07-27 MED ORDER — ACETAMINOPHEN 325 MG PO TABS
325.0000 mg | ORAL_TABLET | Freq: Four times a day (QID) | ORAL | 0 refills | Status: DC | PRN
Start: 2019-07-27 — End: 2019-08-03

## 2019-07-27 MED ORDER — METOCLOPRAMIDE HCL 5 MG/ML IJ SOLN
10.00 mg | Freq: Once | INTRAMUSCULAR | Status: AC
Start: 2019-07-27 — End: 2019-07-27
  Administered 2019-07-27: 22:00:00 10 mg via INTRAVENOUS
  Filled 2019-07-27: qty 2

## 2019-07-27 MED ORDER — SODIUM CHLORIDE 0.9 % IV BOLUS
1000.00 mL | Freq: Once | INTRAVENOUS | Status: AC
Start: 2019-07-27 — End: 2019-07-27
  Administered 2019-07-27: 20:00:00 1000 mL via INTRAVENOUS

## 2019-07-27 MED ORDER — MAGNESIUM SULFATE IN D5W 1-5 GM/100ML-% IV SOLN
1.00 g | Freq: Once | INTRAVENOUS | Status: AC
Start: 2019-07-27 — End: 2019-07-27
  Administered 2019-07-27: 21:00:00 1 g via INTRAVENOUS
  Filled 2019-07-27: qty 100

## 2019-07-27 NOTE — Progress Notes (Signed)
Have you seen any specialists/other providers since your last visit with [place]?    No      Arm preference verified?   No    The patient is due for spirometry, influenza vaccine, shingles vaccine, pneumonia vaccine and [facility], MAWV

## 2019-07-27 NOTE — ED Provider Notes (Signed)
EMERGENCY DEPARTMENT HISTORY AND PHYSICAL EXAM     None        Date: 07/27/2019  Patient Name: Kent Jordan  Attending Physician: Tenny Craw, MD  Advanced Practice Provider: Lynita Lombard    History of Presenting Illness       History Provided By: Patient    Chief Complaint:  Chief Complaint   Patient presents with    Dizziness    Headache    Fatigue     Onset: 3 weeks  Timing: Persistent  Location: Head abdomen generalized  Quality: Congestion diarrhea dehydration  Severity: Moderate  Exacerbating factors: None  Alleviating factors: No improvement with course of amoxicillin or Z-Pak  Associated Symptoms: see ROS  Pertinent Negatives: see ROS    Additional History: Kent Jordan is a 68 y.o. male presenting to the ED with history of type 2 diabetes arthritis hemochromatosis presents with 3 weeks of URI symptoms.  Patient states that about 3 weeks ago he was seen by his primary care doctor who prescribed him an amoxicillin.  He stated he has been having daily headaches as well as dry cough.  He was tested for Covid and was negative.  Patient also states that he was not feeling well today and so he went to his primary care doctor again and they were given a Z-Pak but then patient threw up 1 pill.  He has lower abdominal pain as well but no vomiting.  Denies any neck pain any fever or chills.  No neck stiffness she has some postnasal drip    PCP: Erasmo Score, MD  SPECIALISTS:     Jayvaughn, [place]   Home Medication Instructions ZOX:[id number]    Printed on:07/27/19 2150   Medication Information 08 AM 10 AM 12 Noon 06 PM 08 PM 10 PM Bed time             acetaminophen (TYLENOL) 325 MG tablet  Take 1 tablet (325 mg total) by mouth every 6 (six) hours as needed for Pain            amLODIPine (NORVASC) 10 MG tablet  Take 10 mg by mouth daily               aspirin 81 MG tablet  Take 81 mg by mouth daily.            azithromycin (ZITHROMAX) 250 MG tablet  Take 2 tablets (500 mg) on  Day 1,  followed by 1 tablet  (250 mg) once daily on Days 2 through 5.            celecoxib (CeleBREX) 200 MG capsule  Take 200 mg by mouth 2 (two) times daily            DULoxetine (CYMBALTA) 60 MG capsule  Take 60 mg by mouth daily.            folic acid (FOLVITE) 400 MCG tablet  Take 400 mcg by mouth daily.            hydroCHLOROthiazide (HYDRODIURIL) 25 MG tablet  Take 25 mg by mouth daily.               ibuprofen (ADVIL,MOTRIN) 600 MG tablet  Take 1 tablet (600 mg total) by mouth every 8 (eight) hours as needed for Pain.            metFORMIN (GLUCOPHAGE-XR) 500 MG 24 hr tablet  TAKE 1 TABLET (500 MG TOTAL) BY MOUTH EVERY MORNING WITH  BREAKFAST            metoprolol succinate XL (TOPROL-XL) 25 MG 24 hr tablet  Take 25 mg by mouth daily               Omega-3 Fatty Acids (Omega-3 Fish Oil) 500 MG Cap  Take 1,000 mg by mouth daily            ondansetron (ZOFRAN-ODT) 4 MG disintegrating tablet  Take 1 tablet (4 mg total) by mouth every 6 (six) hours as needed for Nausea            rosuvastatin (CRESTOR) 5 MG tablet  Take 1 tablet (5 mg total) by mouth daily            Secukinumab, 300 MG Dose, (Cosentyx, 300 MG Dose,) 150 MG/ML Solution Prefilled Syringe  Inject into the skin            traZODone (DESYREL) 50 MG tablet  Take 50 mg by mouth nightly as needed.                    Past History     Past Medical History:  Past Medical History:   Diagnosis Date    Arrhythmia     when on ARB    Arthritis     psoriatic and osteoarthritis     Back pain     Blood disorder 11/2014    Hemochromatosis    Diabetes mellitus 2016    started metformin 05/2016 d/t A1C 7.1 - now checking BS 2x/day - average 70-110 in the last week - A1C on 06/29/16 = 6.3 , has also lost 16 lbs since 05/2016    Difficulty in walking(719.7)     Uses cane to ambulate    Elevated liver function tests     had abd u/s 07/05/16     Failed back syndrome     Fatty liver     Hemochromatosis     phlebotomy every 3 months - last done 02/2016    Hemochromatosis 09/2014    High  cholesterol     Hypertensive disorder 06/2016    norvasc recently increased to 10 mg daily - now controlled on meds - most recent BP 117/73    Low back pain     Lung nodule 2012    benign - was monitored x  3 yrs by Dr. Bernita Raisin ([facility]) - signed off from pulmonology - no further f/u required since 2015    Neuropathy     Tingling left foot, numbness left toes    Psoriasis     Psoriatic arthritis     tx w/ enbrel    S/P insertion of spinal cord stimulator 2015    Sleep apnea     mild - does not have CPAP - was unable to tolerate, less snoring since turboplasty    Type 2 diabetes mellitus, controlled        Past Surgical History:  Past Surgical History:   Procedure Laterality Date    ABDOMINAL SURGERY  03/1971    double hernia repair    ARTHROPLASTY, KNEE, TOTAL Right 08/20/2016    Procedure: ARTHROPLASTY, KNEE, TOTAL;  Surgeon: Harriet Masson, MD;  Location: [facility];  Service: Orthopedics;  Laterality: Right;  RIGHT TOTAL KNEE REPLACEMENT    ARTHROSCOPY, KNEE Right 03/21/2016    Procedure: ARTHROSCOPY, KNEE, RIGHT, PARTIAL MEDIAL MENISCECTOMY, PARTIAL SYNOVECTOMY;  Surgeon: Verner Chol, MD;  Location: [facility];  Service: Orthopedics;  Laterality: Right;    CATARACT EXT.WITH IOL Bilateral 2009    FRACTURE SURGERY  1993    right middle finger    INSERTION, SPINAL CORD STIMULATOR GENERATOR N/A 05/07/2014    Procedure: DORSAL COLUMN STIMULATOR PLACEMENT;  Surgeon: Tera Helper, MD;  Location: [facility];  Service: Neurosurgery;  Laterality: N/A;  DCS PLACEMENT    KNEE ARTHROCENTESIS  1983    right    LUMBAR FUSION  10/2011    MICRODISCECTOMY LUMBAR  06/2011, 07/2011    SEPTOPLASTY  10/05/2015    SKIN BIOPSY  11/2015    Negative       Family History:  Family History   Problem Relation Age of Onset    Heart disease Mother     Heart disease Father        Social History:  Social History     Socioeconomic History    Marital status: Married     Spouse name: None    Number of  children: None    Years of education: None    Highest education level: None   Occupational History    None   Social Engineer, site strain: None    Food insecurity     Worry: None     Inability: None    Transportation needs     Medical: None     Non-medical: None   Tobacco Use    Smoking status: Former Smoker     Packs/day: 1.00     Years: 41.00     Pack years: 41.00     Quit date: 09/26/2011     Years since quitting: 7.8    Smokeless tobacco: Never Used   Substance and Sexual Activity    Alcohol use: Yes     Alcohol/week: 2.0 standard drinks     Types: 4 Cans of beer per week    Drug use: No    Sexual activity: Yes     Partners: Female     Birth control/protection: Surgical   Lifestyle    Physical activity     Days per week: None     Minutes per session: None    Stress: None   Relationships    Social connections     Talks on phone: None     Gets together: None     Attends religious service: None     Active member of club or organization: None     Attends meetings of clubs or organizations: None     Relationship status: None    Intimate partner violence     Fear of current or ex partner: None     Emotionally abused: None     Physically abused: None     Forced sexual activity: None   Other Topics Concern    None   Social History Narrative    None       Allergies:  Allergies   Allergen Reactions    Angiotensin Receptor Blockers Other (See Comments)     ARB combined with Celebrex caused Arrhythmia due to high K+    Ciprofloxacin Swelling and Anaphylaxis     swelling    Ace Inhibitors Angioedema    Gabapentin Other (See Comments)     blurred vision, dizziness       Review of Systems     General: No fever, no sweats, no chills.   HENT: Positive daily headache, no nasal congestion, no sore throat  Respiratory: Positive dry cough,  no shortness of breath.  Cardiovascular: No chest pain, no calf pain or swelling.   Gastrointestinal: Positive abdominal pain nausea vomiting diarrhea  Genito-Urinary: No dysuria, no frequency, no hematuria  Musculoskeletal: No myalgias, no joint pains.   Neurological: No new focal weakness, no sensory changes.   Dermatological: No new rashes, no color changes.   Psychological: No acute mood changes, no confusion.     Review of Systems    Physical Exam     Vitals:    07/27/19 1840 07/27/19 2000   BP: 131/74 133/75   Pulse: 83 92   Resp: 20 14   Temp: 98.3 F (36.8 C)    TempSrc: Oral    SpO2: 99% 98%   Weight: 92.1 kg    Height: 5\' 10"  (1.778 m)          Constitutional: Vital signs reviewed.  Nontoxic fatigued head: Normocephalic, atraumatic. No external trauma noted.  Eyes: Conjunctiva and sclera are normal. Extraocular movements intact, pupils equal, round, reactive.  Ear, Nose, Throat:  Normal external examination of the nose and ears.  Dry MM  Neck: Supple.    Respiratory/Chest: Clear to auscultation. No respiratory distress. No wheezing, rhonchi or rales.  Cardiovascular: Regular rate. Regular rhythm. S1, S2.   Abdomen: Normoactive Bowel sounds.  Positive lower abdominal tenderness with guarding   back: No midline tenderness, no CVA tenderness.   Extremities: Upper and lower extremities with no cyanosis or edema. No calf tenderness. Normal +2 pulses in all extremities.  Skin: Warm and dry. No rash.  Neuro: alert and appropriate, normal speech, no facial droop, moving all extremities.       Physical Exam      Diagnostic Study Results     Labs -     Results     Procedure Component Value Units Date/Time    COVID-19 (SARS-CoV-2) [[id number]] Collected: 07/27/19 2121    Specimen: Nasopharyngeal Swab from Nasopharynx Updated: 07/27/19 2122    Narrative:      o Collect and clearly label specimen type:  o PREFERRED-Upper respiratory specimen: One Nasopharyngeal  Swab in Transport Media.  o Hand deliver to laboratory ASAP    Troponin I [[id number]] Collected: 07/27/19 1956    Specimen: Blood Updated: 07/27/19 2043     Troponin I 0.03 ng/mL     Comprehensive metabolic  panel [[id number]]  (Abnormal) Collected: 07/27/19 1956    Specimen: Blood Updated: 07/27/19 2037     Glucose 300 mg/dL      BUN 28.4 mg/dL      Creatinine 1.0 mg/dL      Sodium 132 mEq/L      Potassium 4.9 mEq/L      Chloride 103 mEq/L      CO2 24 mEq/L      Calcium 9.4 mg/dL      Protein, Total 6.4 g/dL      Albumin 3.7 g/dL      AST (SGOT) 33 U/L      ALT 75 U/L      Alkaline Phosphatase 88 U/L      Bilirubin, Total 0.6 mg/dL      Globulin 2.7 g/dL      Albumin/Globulin Ratio 1.4     Anion Gap 12.0    GFR [[id number]] Collected: 07/27/19 1956     Updated: 07/27/19 2037     EGFR >60.0    Rapid influenza A/B antigens [[id number]] Collected: 07/27/19 1956    Specimen: Nasopharyngeal Swab from Nasal Aspirate Updated: 07/27/19 2031  Narrative:      ORDER#: [id number]                                    ORDERED BY: Lysle Pearl  SOURCE: Nasal Aspirate                               COLLECTED:  07/27/19 19:56  ANTIBIOTICS AT COLL.:                                RECEIVED :  07/27/19 20:15  Influenza Rapid Antigen A&B                FINAL       07/27/19 20:31  07/27/19   Negative for Influenza A and B             Reference Range: Negative      UA with reflex to micro (pts  3 + yrs) [[id number]]  (Abnormal) Collected: 07/27/19 1956    Specimen: Urine Updated: 07/27/19 2030     Urine Type Clean Catch     Color, UA Yellow     Clarity, UA Clear     Specific Gravity UA 1.020     Urine pH 5.5     Leukocyte Esterase, UA Negative     Nitrite, UA Negative     Protein, UR Negative     Glucose, UA 500     Ketones UA >=80     Urobilinogen, UA 0.2 mg/dL      Bilirubin, UA Negative     Blood, UA Negative    CBC and differential [[id number]]  (Abnormal) Collected: 07/27/19 1956    Specimen: Blood Updated: 07/27/19 2026     WBC 11.28 x10 3/uL      Hgb 13.3 g/dL      Hematocrit 84.6 %      Platelets 283 x10 3/uL      RBC 4.07 x10 6/uL      MCV 95.1 fL      MCH 32.7 pg      MCHC 34.4 g/dL      RDW 12 %      MPV 9.8 fL      Neutrophils 70.1 %       Lymphocytes Automated 19.5 %      Monocytes 8.1 %      Eosinophils Automated 0.4 %      Basophils Automated 0.8 %      Immature Granulocytes 1.1 %      Nucleated RBC 0.0 /100 WBC      Neutrophils Absolute 7.92 x10 3/uL      Lymphocytes Absolute Automated 2.20 x10 3/uL      Monocytes Absolute Automated 0.91 x10 3/uL      Eosinophils Absolute Automated 0.04 x10 3/uL      Basophils Absolute Automated 0.09 x10 3/uL      Immature Granulocytes Absolute 0.12 x10 3/uL      Absolute NRBC 0.00 x10 3/uL     Urine culture [[id number]] Collected: 07/27/19 1956    Specimen: Urine, Clean Catch Updated: 07/27/19 2021    Narrative:      Replace urinary catheter prior to obtaining the urine culture  if it has been in place for greater than or equal to 14  days:->N/A No Foley  Indications for Urine Culture:->Suprapubic Pain/Tenderness or  Dysuria    Glucose Whole Blood - POCT [[id number]]  (Abnormal) Collected: 07/27/19 1836     Updated: 07/27/19 1838     Whole Blood Glucose POCT 276 mg/dL           Radiologic Studies -   Radiology Results (24 Hour)     Procedure Component Value Units Date/Time    CT Abd/Pelvis without Contrast [[id number]] Collected: 07/27/19 1945    Order Status: Completed Updated: 07/27/19 1953    Narrative:      CT ABDOMEN PELVIS WO IV/ WO PO CONT  CLINICAL HISTORY: pain. Type or side not specified.    COMPARISON: None available.    TECHNIQUE: Helical CT scan through the abdomen was obtained from the  domes of the diaphragm to the pubic symphysis without intravenous or  oral contrast, as it was tailored for evaluation of urinary tract  stones.  Note that CT scanning at this site  utilizes multiple dose reduction  techniques including automatic exposure control, adjustment of the MAS  and/or KVP according to patient's size and use of iterative  reconstruction technique    FINDINGS: There is no hydronephrosis. No stone is identified in either  kidney or the ureters.     Absence of contrast limits the evaluation of  the solid organs and  intraperitoneal contents. The unenhanced liver, spleen, pancreas,   gallbladder, and adrenal glands are within normal limits for age.    There is no bowel obstruction or abnormal bowel dilatation. The appendix  is normal.     Postsurgical changes of the lumbar spine noted post metallic rod and  pedicle screw fixation between L3 and S1. Disc spacers noted at L4/L5  and L5/S1. Advanced degenerative changes of the abdominal 3/L4 disc with  vacuum phenomena noted. No evidence of acute fracture.    The urinary bladder is within normal limits.. The  prostate and seminal  vesicles are within normal limits.       Impression:         1. No renal stone or hydronephrosis.   2. No evidence of acute intraperitoneal process in this unenhanced  study.  3. Post surgical changes of the lumbar spine and multilevel degenerative  changes        Heron Nay, MD   07/27/2019 7:51 PM    CT Head without Contrast [[id number]] Collected: 07/27/19 1938    Order Status: Completed Updated: 07/27/19 1942    Narrative:      Clinical History:  Headache.    Examination:  CT HEAD WO CONTRAST    TECHNIQUE:  5 mm helical images obtained from the skull base through the vertex  without contrast. 3 mm sagittal and coronal reformatted images provided.   CT images were acquired using Automated Exposure Control for dose  reduction.     COMPARISON:   05/19/2017    FINDINGS:     There is no evidence of acute intracranial hemorrhage, extra-axial  collection, mass effect, midline shift, herniation or hydrocephalus. The  ventricles, sulci and cisterns are age appropriate.  The gray-white  differentiation is intact.   The visualized paranasal sinuses and  mastoid air cells are clear.   The surrounding soft tissues and osseous  structures are unremarkable.        Impression:          No acute abnormality detected.    Carleene Overlie, MD   07/27/2019 7:40 PM    Chest AP Portable [[id number]]  Collected: 07/27/19 1915    Order Status: Completed  Updated: 07/27/19 1918    Narrative:      XR CHEST AP PORTABLE  CLINICAL INDICATION: chest pain    COMPARISON: 10/05/2015    FINDINGS:  A portable AP view  of the chest was obtained. The lungs are  clear. The heart and vascularity are within normal limits. The lateral  costophrenic angles are sharp.       Impression:       No acute process.     Heron Nay, MD   07/27/2019 7:16 PM      .    Medical Decision Making   I am the first provider for this patient.    I reviewed the vital signs, available nursing notes, past medical history, past surgical history, family history and social history.    Vital Signs-Reviewed the patient's vital signs.     Patient Vitals for the past 12 hrs:   BP Temp Pulse Resp   07/27/19 2000 133/75  92 14   07/27/19 1840 131/74 98.3 F (36.8 C) 83 20       Pulse Oximetry Analysis - Normal 99% on RA       Procedures:   Procedures      EKG:   normal sinus rhythm rate of 79 poor R wave progression    t:       Old Medical Records: Old medical records.  Previous electrocardiograms.  Nursing notes.     ED Course:   ED Course as of Jul 26 2149   Mon Jul 27, 2019   2100 Feeling better eager to go home    [AD]   2140 Patient updated again on return precaution and labs again eager to go home    [AD]      ED Course User Index  [AD] Lynita Lombard, PA       Provider Notes:    68 y.o. male history of hemochromatosis type 2 diabetes presents with dizziness daily headache and URI symptoms despite a round of outpatient amox.    Patient appears well nontoxic normal vitals.  Labs remarkable for elevated BUN likely due to dehydration.  Normal hemoglobin.  No complaints of GI bleeding.  CT head is negative chest x-ray clear.  CT abdomen done due to abdominal pain and diarrhea is negative.    Do not suspect ACS given negative troponin and EKG  Do not suspect SAH.  Patient neuro intact    Flu negative  Rule out Covid  Cont azithro rx'ed by pmd although I suspect viral etiology    Patient feeling better  after IV fluids and supportive care  Eager to go home  Will be going home with wife    Rx use and side effects, results, home self care, discharge instructions, and return precautions discussed extensively with patient. Possibility of evolving illness reviewed. All questions solicited and addressed. Patient is amenable to discharge.      D/w Tenny Craw, MD    Discharge Prescriptions     Medication Sig Dispense Auth. Provider    ondansetron (ZOFRAN-ODT) 4 MG disintegrating tablet Take 1 tablet (4 mg total) by mouth every 6 (six) hours as needed for Nausea 8 tablet Lynita Lombard, PA    acetaminophen (TYLENOL) 325 MG tablet Take 1 tablet (325 mg total) by mouth every 6 (six) hours as needed for Pain 12 tablet Lynita Lombard, [place]  Diagnosis     Clinical Impression:   1. Suspected COVID-19 virus infection    2. Dehydration    3. Headache syndrome        Treatment Plan:   ED Disposition     ED Disposition Condition Date/Time Comment    Discharge  Mon Jul 27, 2019  9:44 PM Travonta Hoard [facility] discharge to home/self care.    Condition at disposition: Stable            _______________________________    CHART OWNERSHIPNigel Sloop, PA-C, am the primary clinician of record.  _______________________________       Lynita Lombard, PA  07/27/19 2153       Tenny Craw, MD  07/27/19 2213

## 2019-07-27 NOTE — Discharge Instructions (Signed)
Dehydration    You have been diagnosed with dehydration.    Dehydration is when your body is low on fluids (liquids). Dehydration has a variety of causes. These range from vomiting and diarrhea, to excessive (a lot of) sweating and poor appetite.    You have received intravenous (IV) fluids. These are to help fix your dehydration. It is important to keep hydrating yourself at home. Drink plenty of fluids frequently. Drink fluids that won't upset your stomach. This includes water and juice. It also includes drinks like Gatorade. Stay away from beverages like soda pop and tea. They may make you worse. Avoid caffeine and alcohol. They may cause you to lose more fluid.    YOU SHOULD SEEK MEDICAL ATTENTION IMMEDIATELY, EITHER HERE OR AT THE NEAREST EMERGENCY DEPARTMENT, IF ANY OF THE FOLLOWING OCCURS:   Fever (temperature higher than 100.4F / 38C) or shaking chills.   Constant vomiting and/or diarrhea.   Lightheadedness or fainting.   If you do not urinate (pee) for 8 or more hours.               Headache    You have been treated for a headache.    Headaches are very common. Most of the time they are benign (not harmful). Some headaches can be very serious. Your headache appears to be benign. The doctor feels it is OK for you to go home.    If you continue to have headaches, or if this headache does not go away over the next few days, you should be evaluated by your regular doctor or a neurologist. Keeping a "headache diary" may help your doctor learn the cause of your headaches.    When you get a headache, write down:   What happens before your headache starts - Where you were, what you were doing, if you ate anything, and so on.   Where your pain is.   What kind-of pain you have - Sharp, aching, throbbing, burning.    What helps your headache get better.    Take your headache medication as directed. This is very important if your doctor has placed you on a daily medication to prevent  headaches.    Return here or go to the nearest Emergency Department immediately if:   Your headache gets worse.   You have a severe headache that starts suddenly.   Your head pain is different from your normal headache.   You have a fever (temperature higher than 100.4F / 38C), especially with a stiff neck.   You feel numbness, tingling, or weakness in your arms or legs.   You pass out.   You have problems with your vision.   You vomit (throw up) and have trouble taking medication or keeping it down.    If you can't follow up with your doctor, or if at any time you feel you need to be rechecked or seen again, come back here or go to the nearest emergency department.             Follow-up Steps for Patients with Pending COVID-19 Testing - February 24, 2019     Thank you for choosing [facility]. During your visit, you received a COVID-19 test. You should receive your results in 3-5 days.    When will I know the results of my COVID-19 test?  There are multiple ways to see your COVID-19 test results after the 3-5 day waiting period. Please ensure we have your most up to date   contact information on file before you leave your appointment.    1. The easiest way to find test results done at an [facility] care site is through your MyChart account, [facility]'s online patient portal. This is also a good way to connect with your [facility] primary care team for continued care. If you do not already have MyChart activated, you will be given an activation code at the time of testing. Please note that MyChart is unavailable for children age 12-17.    2. Your Primary Care Physician, the physician who ordered your test or another [facility] team member may call you with your test results.    3. If you're having difficulty finding your test results, or if you have additional questions, please contact the COVID-19 Test Results Call Center at [phone] for further instructions. The call center is open daily from 8am - 8pm.    If you  need help activating your MyChart account, please call [phone]. You can download the MyChart application to your phone using the QR codes below:                 What should I do while I wait for my test results?  . Take good care of yourself and be mindful of the safety of those around you.  . Rest and stay well hydrated.  . Use acetaminophen (Tylenol) to help manage fever.  . Wash your hands often.  . Stay at home except to receive medical care.  . Call ahead before seeing your doctor, medical provider, or when seeking medical care at any care site.  . Isolate yourself from others as much as possible.  . Clean all "high touch" surfaces daily--such as tabletops, handles/doorknobs, and phones,    Instructions continue on next page    . Most people with COVID-19 will get better; however, a small number may require more treatment. Please contact your doctor or present to the nearest Emergency Department if you develop the warning signs of more severe infection such as:    ? A fever over 102 ?F not controlled by acetaminophen (Tylenol)  ? Shortness of breath or chest pain  ? Worsening cough  ? Increasing weakness  ? Inability to tolerate oral fluids    Some general things to think about:  . If you have a medical emergency and need to call 911, notify the person answering the phone that you may have COVID-19. If possible, put on a facemask before the ambulance arrives.    . Persons placed under active monitoring or facilitated self-monitoring should follow instructions provided by their local health department or occupational health professionals, as appropriate.    When can I stop self-isolation?  Until you receive your results, you should remain home and avoid contact with others (self-isolation).  The decision to stop isolation precautions should be made on a case-by-case basis, in consultation with healthcare providers and state and local health departments.    . If your test result is negative, you should still  self-isolate until 3 days after your symptoms stop.  . If your test result is positive, you should stay home for at least 10 days from the day your symptoms started. If you are still having symptoms at the end of 10 days, continue in-home isolation until 3 days after your symptoms stop. Talk to your healthcare provider to determine what follow-up is needed.  You can also schedule telemedicine visits with your primary care physician to discuss ongoing symptoms or new concerns that   may arise.    For further information, please consult the following websites:  Centers for Disease Control and Prevention (CDC)  https://www.cdc.gov/coronavirus/2019-ncov/about/steps-when-sick.html    [place] Department of Health  https://www.vdh.[place].gov/coronavirus/    If you are ill and need to see a healthcare provider, but your primary physician is not available, the [facility] are open for walk in visits.  Open Monday - Friday, 8am - 8pm at the following locations:  . [facility]: [address] [place], [place] [address], [phone]  . [facility]: [address], [place], [place] [address], [phone]    Open Daily 8am - 8pm including weekends at the following location:  . [facility]: [address], [place], [place] [address], [phone]      All  Emergency Departments can care for patients with all conditions, including COVID-19.  Do not hesitate to seek care should you have a healthcare emergency.

## 2019-07-27 NOTE — Progress Notes (Signed)
Patient ID: Kent Jordan  is a 68 y.o.  male.     Verbal consent has been obtained from the patient to conduct a video visit encounter to minimize exposure to COVID-19:  Yes     Chief Complaint   Patient presents with    Sinus Problem    Headache    Generalized Body Aches      HPI    Sinus Problem 10/22  This is a new problem. The current episode started 1 to 4 weeks ago. The problem is unchanged. There has been no fever. Associated symptoms include congestion, coughing and sinus pressure. Past treatments include nothing.   Negative COVID-19 test    07/27/2019 reports moderate improvement with symptoms while on Augmentin, exacerbation of symptoms since completing ABX, continues to have sinus congestion/pressure/frontal headache/body ache, denies fevers, intermittent productive cough but no dyspnea with exertion    The following portions of the patient's history were reviewed and updated as appropriate: current medications, allergies, past family history, past medical history, past social history, past surgical history and problem list.     Review of Systems     See HPI    OBJECTIVE     Physical exam limited due to pandemic    There were no vitals taken for this visit.    Physical Exam    GENERAL : alert and in no distress   HEENT: ncat, frontal sinus pressure  LUNGS: unlabored breathing  PSYCH: Normal mood and affect, linear thought process, appropriate interactions, no SI/hallucinations      ASSESSMENT      Kent Jordan is a 68 y.o. male with   1. Acute non-recurrent frontal sinusitis  azithromycin (ZITHROMAX) 250 MG tablet   2. Cough  X-ray chest PA and lateral          PLAN     I spent 15 mins during this encounter discussing/counseling/coordinating care for the following medical problem(s)     1. Acute non-recurrent frontal sinusitis  -Negative COVID-19 test, start Z-Pak to cover for CAP, review ED precautions for respiratory distress, encourage Claritin/Flonase/Nasal saline flush/humidifier; encourage use  of probiotic  - azithromycin (ZITHROMAX) 250 MG tablet; Take 2 tablets (500 mg) on  Day 1,  followed by 1 tablet (250 mg) once daily on Days 2 through 5.  Dispense: 6 tablet; Refill: 0    2. Cough  - X-ray chest PA and lateral; Future     Call if symptoms persist, worsen, or change.  Call with updates/questions/concerns.    F/u PRN    Medication list reviewed with patient and updated as indicated.  Risk & Benefits of any new medication(s) were explained to the patient who verbalized understanding & agreed to the treatment plan.  Patient given a printed copy of AVS. Patient verbalized understanding of instructions given.

## 2019-07-27 NOTE — ED Triage Notes (Signed)
Weakness, bilateral upper abd pain, recently finished course of amoxicillin for sinus infection, + frontal headache for 3 weeks, tested for covid 3 weeks ago which was negative, blood sugar has been elevated, +SOB noted today

## 2019-07-28 ENCOUNTER — Telehealth (INDEPENDENT_AMBULATORY_CARE_PROVIDER_SITE_OTHER): Payer: Medicare Other | Admitting: Student in an Organized Health Care Education/Training Program

## 2019-07-28 ENCOUNTER — Encounter (INDEPENDENT_AMBULATORY_CARE_PROVIDER_SITE_OTHER): Payer: Self-pay | Admitting: Student in an Organized Health Care Education/Training Program

## 2019-07-28 DIAGNOSIS — J069 Acute upper respiratory infection, unspecified: Secondary | ICD-10-CM

## 2019-07-28 NOTE — Progress Notes (Signed)
Have you seen any specialists/other providers since your last visit with [place]?    No    Arm preference verified?   Yes    The patient is due for influenza vaccine, shingles vaccine, pneumonia vaccine and PNEUMONIA, MAWV

## 2019-07-28 NOTE — Progress Notes (Signed)
Patient ID: Kent Jordan  is a 68 y.o.  male.     Verbal consent has been obtained from the patient to conduct a video visit encounter to minimize exposure to COVID-19:  Yes     Chief Complaint   Patient presents with    Follow-up    Dizziness    Headache      HPI    Sinus Problem 10/22  This is anewproblem. The current episode started1 to 4 weeks ago. The problem isunchanged. There has beenno fever. Associated symptoms includecongestion,coughingand sinus pressure. Past treatments includenothing.  Negative COVID-19 test    07/27/2019 reports moderate improvement with symptoms while on Augmentin, exacerbation of symptoms since completing ABX, continues to have sinus congestion/pressure/frontal headache/body ache, denies fevers, intermittent productive cough but no dyspnea with exertion    11/3 -seen in ED for episode of emesis taking 1 pill of azithromycin, associated low abdominal pain.  ED work-up with negative influenza/cardiac enzyme/head CT/chest x-ray/abdominal CT    07/28/2019 -reports improvement of symptoms, no fever/headache/nausea/vomiting/dyspnea, tolerating p.o. fluids    The following portions of the patient's history were reviewed and updated as appropriate: current medications, allergies, past family history, past medical history, past social history, past surgical history and problem list.     Review of Systems     See HPI    OBJECTIVE     Physical exam limited due to pandemic    There were no vitals taken for this visit.    Physical Exam    GENERAL : alert and in no distress   HEENT: ncat, decreased sinus pressure, no drainage  LUNGS: unlabored breathing  PSYCH: Normal mood and affect, linear thought process, appropriate interactions, no SI/hallucinations      ASSESSMENT      Kent Jordan is a 68 y.o. male with   1. Viral URI with cough            PLAN     I spent 15 mins during this encounter discussing/counseling/coordinating care for the following medical problem(s)       1. Viral  URI with cough  - Mild to moderate URI symptoms, afebrile, previous neg COVID-19 testing/influenza, normal chest x-ray, reviewed home isolation instructions, reviewed ED precautions for respiratory distress  - Fluids, rest, Tylenol/ibuprofen/Zofran as needed  - Decongestants and/or antitussives as needed      Call if symptoms persist, worsen, or change.  Call with updates/questions/concerns.    F/u PRN    Medication list reviewed with patient and updated as indicated.  Risk & Benefits of any new medication(s) were explained to the patient who verbalized understanding & agreed to the treatment plan.  Patient given a printed copy of AVS. Patient verbalized understanding of instructions given.

## 2019-07-29 ENCOUNTER — Ambulatory Visit (INDEPENDENT_AMBULATORY_CARE_PROVIDER_SITE_OTHER): Payer: Medicare Other | Admitting: Orthopaedic Surgery

## 2019-07-29 LAB — ECG 12-LEAD
Atrial Rate: 79 {beats}/min
P Axis: 32 degrees
P-R Interval: 154 ms
Q-T Interval: 370 ms
QRS Duration: 100 ms
QTC Calculation (Bezet): 424 ms
R Axis: -21 degrees
T Axis: 60 degrees
Ventricular Rate: 79 {beats}/min

## 2019-07-29 LAB — COVID-19 (SARS-COV-2): SARS CoV 2 Overall Result: NOT DETECTED

## 2019-07-29 NOTE — Progress Notes (Signed)
Negative urine culture. No further work.

## 2019-08-02 ENCOUNTER — Encounter (INDEPENDENT_AMBULATORY_CARE_PROVIDER_SITE_OTHER): Payer: Self-pay

## 2019-08-03 ENCOUNTER — Emergency Department: Payer: Medicare Other

## 2019-08-03 ENCOUNTER — Inpatient Hospital Stay
Admission: EM | Admit: 2019-08-03 | Discharge: 2019-08-06 | DRG: 176 | Disposition: A | Discharge status: Home / Self Care | Payer: Medicare Other | Attending: Internal Medicine | Admitting: Internal Medicine

## 2019-08-03 DIAGNOSIS — R0609 Other forms of dyspnea: Secondary | ICD-10-CM | POA: Diagnosis present

## 2019-08-03 DIAGNOSIS — E663 Overweight: Secondary | ICD-10-CM | POA: Diagnosis present

## 2019-08-03 DIAGNOSIS — K738 Other chronic hepatitis, not elsewhere classified: Secondary | ICD-10-CM | POA: Diagnosis present

## 2019-08-03 DIAGNOSIS — E119 Type 2 diabetes mellitus without complications: Secondary | ICD-10-CM | POA: Diagnosis present

## 2019-08-03 DIAGNOSIS — R0789 Other chest pain: Secondary | ICD-10-CM | POA: Diagnosis present

## 2019-08-03 DIAGNOSIS — F1010 Alcohol abuse, uncomplicated: Secondary | ICD-10-CM | POA: Diagnosis present

## 2019-08-03 DIAGNOSIS — Z87891 Personal history of nicotine dependence: Secondary | ICD-10-CM

## 2019-08-03 DIAGNOSIS — Z20828 Contact with and (suspected) exposure to other viral communicable diseases: Secondary | ICD-10-CM | POA: Diagnosis present

## 2019-08-03 DIAGNOSIS — L4050 Arthropathic psoriasis, unspecified: Secondary | ICD-10-CM | POA: Diagnosis present

## 2019-08-03 DIAGNOSIS — R911 Solitary pulmonary nodule: Secondary | ICD-10-CM | POA: Diagnosis present

## 2019-08-03 DIAGNOSIS — Z79899 Other long term (current) drug therapy: Secondary | ICD-10-CM

## 2019-08-03 DIAGNOSIS — M1990 Unspecified osteoarthritis, unspecified site: Secondary | ICD-10-CM | POA: Diagnosis present

## 2019-08-03 DIAGNOSIS — Z791 Long term (current) use of non-steroidal anti-inflammatories (NSAID): Secondary | ICD-10-CM

## 2019-08-03 DIAGNOSIS — R0600 Dyspnea, unspecified: Secondary | ICD-10-CM | POA: Diagnosis present

## 2019-08-03 DIAGNOSIS — Z7982 Long term (current) use of aspirin: Secondary | ICD-10-CM

## 2019-08-03 DIAGNOSIS — Z6828 Body mass index (BMI) 28.0-28.9, adult: Secondary | ICD-10-CM

## 2019-08-03 DIAGNOSIS — Z789 Other specified health status: Secondary | ICD-10-CM

## 2019-08-03 DIAGNOSIS — G4733 Obstructive sleep apnea (adult) (pediatric): Secondary | ICD-10-CM | POA: Diagnosis present

## 2019-08-03 DIAGNOSIS — I10 Essential (primary) hypertension: Secondary | ICD-10-CM | POA: Diagnosis present

## 2019-08-03 DIAGNOSIS — I2782 Chronic pulmonary embolism: Principal | ICD-10-CM | POA: Diagnosis present

## 2019-08-03 DIAGNOSIS — K760 Fatty (change of) liver, not elsewhere classified: Secondary | ICD-10-CM | POA: Diagnosis present

## 2019-08-03 DIAGNOSIS — Z7984 Long term (current) use of oral hypoglycemic drugs: Secondary | ICD-10-CM

## 2019-08-03 DIAGNOSIS — E782 Mixed hyperlipidemia: Secondary | ICD-10-CM | POA: Diagnosis present

## 2019-08-03 DIAGNOSIS — R0602 Shortness of breath: Secondary | ICD-10-CM | POA: Diagnosis present

## 2019-08-03 DIAGNOSIS — E1142 Type 2 diabetes mellitus with diabetic polyneuropathy: Secondary | ICD-10-CM | POA: Diagnosis present

## 2019-08-03 DIAGNOSIS — R0902 Hypoxemia: Secondary | ICD-10-CM | POA: Diagnosis present

## 2019-08-03 LAB — CBC AND DIFFERENTIAL
Absolute NRBC: 0 10*3/uL (ref 0.00–0.00)
Basophils Absolute Automated: 0.09 10*3/uL — ABNORMAL HIGH (ref 0.00–0.08)
Basophils Automated: 1.1 %
Eosinophils Absolute Automated: 0.22 10*3/uL (ref 0.00–0.44)
Eosinophils Automated: 2.7 %
Hematocrit: 35 % — ABNORMAL LOW (ref 37.6–49.6)
Hgb: 12.2 g/dL — ABNORMAL LOW (ref 12.5–17.1)
Immature Granulocytes Absolute: 0.11 10*3/uL — ABNORMAL HIGH (ref 0.00–0.07)
Immature Granulocytes: 1.3 %
Lymphocytes Absolute Automated: 2.71 10*3/uL (ref 0.42–3.22)
Lymphocytes Automated: 32.9 %
MCH: 32.9 pg (ref 25.1–33.5)
MCHC: 34.9 g/dL (ref 31.5–35.8)
MCV: 94.3 fL (ref 78.0–96.0)
MPV: 9.6 fL (ref 8.9–12.5)
Monocytes Absolute Automated: 0.87 10*3/uL — ABNORMAL HIGH (ref 0.21–0.85)
Monocytes: 10.6 %
Neutrophils Absolute: 4.23 10*3/uL (ref 1.10–6.33)
Neutrophils: 51.4 %
Nucleated RBC: 0 /100 WBC (ref 0.0–0.0)
Platelets: 294 10*3/uL (ref 142–346)
RBC: 3.71 10*6/uL — ABNORMAL LOW (ref 4.20–5.90)
RDW: 13 % (ref 11–15)
WBC: 8.23 10*3/uL (ref 3.10–9.50)

## 2019-08-03 LAB — PT AND APTT
PT INR: 1 (ref 0.9–1.1)
PT: 12.6 s (ref 12.6–15.0)
PTT: 22 s — ABNORMAL LOW (ref 23–37)

## 2019-08-03 LAB — B-TYPE NATRIURETIC PEPTIDE: B-Natriuretic Peptide: 77 pg/mL (ref 0–100)

## 2019-08-03 LAB — COMPREHENSIVE METABOLIC PANEL
ALT: 115 U/L — ABNORMAL HIGH (ref 0–55)
AST (SGOT): 79 U/L — ABNORMAL HIGH (ref 5–34)
Albumin/Globulin Ratio: 1.3 (ref 0.9–2.2)
Albumin: 4 g/dL (ref 3.5–5.0)
Alkaline Phosphatase: 89 U/L (ref 38–106)
Anion Gap: 13 (ref 5.0–15.0)
BUN: 11 mg/dL (ref 9.0–28.0)
Bilirubin, Total: 0.7 mg/dL (ref 0.2–1.2)
CO2: 20 mEq/L — ABNORMAL LOW (ref 22–29)
Calcium: 9.4 mg/dL (ref 8.5–10.5)
Chloride: 104 mEq/L (ref 100–111)
Creatinine: 0.9 mg/dL (ref 0.7–1.3)
Globulin: 3 g/dL (ref 2.0–3.6)
Glucose: 167 mg/dL — ABNORMAL HIGH (ref 70–100)
Potassium: 3.8 mEq/L (ref 3.5–5.1)
Protein, Total: 7 g/dL (ref 6.0–8.3)
Sodium: 137 mEq/L (ref 136–145)

## 2019-08-03 LAB — BLOOD GAS, ARTERIAL
Arterial Total CO2: 43.4 mEq/L — ABNORMAL HIGH (ref 24.0–30.0)
Base Excess, Arterial: -0.9 mEq/L (ref <=2.0)
HCO3, Arterial: 21.7 mEq/L — ABNORMAL LOW (ref 23.0–29.0)
O2 Sat, Arterial: 96.2 % (ref 95.0–100.0)
Temperature: 37
pCO2, Arterial: 30.5 mmHg — ABNORMAL LOW (ref 35.0–45.0)
pH, Arterial: 7.465 — ABNORMAL HIGH (ref 7.350–7.450)
pO2, Arterial: 83.6 mmHg (ref 80.0–90.0)

## 2019-08-03 LAB — GLUCOSE WHOLE BLOOD - POCT: Whole Blood Glucose POCT: 181 mg/dL — ABNORMAL HIGH (ref 70–100)

## 2019-08-03 LAB — TROPONIN I
Troponin I: 0.02 ng/mL (ref 0.00–0.05)
Troponin I: <0.01 ng/mL (ref 0.00–0.05)
Troponin I: <0.01 ng/mL (ref 0.00–0.05)

## 2019-08-03 LAB — IHS D-DIMER: D-Dimer: 0.43 ug/mL FEU (ref 0.00–0.70)

## 2019-08-03 LAB — GFR: EGFR: >60

## 2019-08-03 LAB — TSH: TSH: 4.95 u[IU]/mL — ABNORMAL HIGH (ref 0.35–4.94)

## 2019-08-03 LAB — HEMOLYSIS INDEX: Hemolysis Index: 34 — ABNORMAL HIGH (ref 0–18)

## 2019-08-03 LAB — LIPASE: Lipase: 44 U/L (ref 8–78)

## 2019-08-03 MED ORDER — HYDROCHLOROTHIAZIDE 25 MG PO TABS
25.0000 mg | ORAL_TABLET | Freq: Every day | ORAL | Status: DC
Start: 2019-08-04 — End: 2019-08-06
  Administered 2019-08-04 – 2019-08-06 (×3): 25 mg via ORAL
  Filled 2019-08-03 (×3): qty 1

## 2019-08-03 MED ORDER — INSULIN LISPRO 100 UNIT/ML SC SOLN
1.00 [IU] | Freq: Three times a day (TID) | SUBCUTANEOUS | Status: DC
Start: 2019-08-03 — End: 2019-08-06
  Administered 2019-08-03: 16:00:00 1 [IU] via SUBCUTANEOUS
  Administered 2019-08-04 (×2): 2 [IU] via SUBCUTANEOUS
  Administered 2019-08-04: 18:00:00 1 [IU] via SUBCUTANEOUS
  Administered 2019-08-05: 09:00:00 3 [IU] via SUBCUTANEOUS
  Filled 2019-08-03: qty 6
  Filled 2019-08-03 (×3): qty 9
  Filled 2019-08-03: qty 6
  Filled 2019-08-03 (×2): qty 3

## 2019-08-03 MED ORDER — ASPIRIN EC 81 MG PO TBEC
81.00 mg | DELAYED_RELEASE_TABLET | Freq: Every day | ORAL | Status: DC
Start: 2019-08-04 — End: 2019-08-05
  Administered 2019-08-04 – 2019-08-05 (×2): 81 mg via ORAL
  Filled 2019-08-03 (×2): qty 1

## 2019-08-03 MED ORDER — ROSUVASTATIN CALCIUM 10 MG PO TABS
5.0000 mg | ORAL_TABLET | Freq: Every evening | ORAL | Status: DC
Start: 2019-08-04 — End: 2019-08-06
  Administered 2019-08-04 – 2019-08-05 (×2): 5 mg via ORAL
  Filled 2019-08-03 (×2): qty 1

## 2019-08-03 MED ORDER — ASPIRIN 81 MG PO CHEW
162.00 mg | CHEWABLE_TABLET | Freq: Once | ORAL | Status: AC
Start: 2019-08-03 — End: 2019-08-03
  Administered 2019-08-03: 13:00:00 162 mg via ORAL
  Filled 2019-08-03: qty 2

## 2019-08-03 MED ORDER — ACETAMINOPHEN 325 MG PO TABS
650.0000 mg | ORAL_TABLET | Freq: Once | ORAL | Status: DC | PRN
Start: 2019-08-03 — End: 2019-08-06

## 2019-08-03 MED ORDER — POLYETHYLENE GLYCOL 3350 17 G PO PACK
17.00 g | PACK | Freq: Every day | ORAL | Status: DC | PRN
Start: 2019-08-04 — End: 2019-08-06

## 2019-08-03 MED ORDER — CELECOXIB 100 MG PO CAPS
200.00 mg | ORAL_CAPSULE | Freq: Two times a day (BID) | ORAL | Status: DC
Start: 2019-08-03 — End: 2019-08-06
  Administered 2019-08-03 – 2019-08-06 (×6): 200 mg via ORAL
  Filled 2019-08-03 (×6): qty 2

## 2019-08-03 MED ORDER — NALOXONE HCL 0.4 MG/ML IJ SOLN (WRAP)
0.20 mg | INTRAMUSCULAR | Status: DC | PRN
Start: 2019-08-03 — End: 2019-08-06

## 2019-08-03 MED ORDER — ONDANSETRON HCL 4 MG/2ML IJ SOLN
4.00 mg | Freq: Once | INTRAMUSCULAR | Status: DC | PRN
Start: 2019-08-03 — End: 2019-08-06

## 2019-08-03 MED ORDER — GLUCAGON 1 MG IJ SOLR (WRAP)
1.00 mg | INTRAMUSCULAR | Status: DC | PRN
Start: 2019-08-03 — End: 2019-08-06

## 2019-08-03 MED ORDER — ONDANSETRON 4 MG PO TBDP
4.00 mg | ORAL_TABLET | Freq: Four times a day (QID) | ORAL | Status: DC | PRN
Start: 2019-08-03 — End: 2019-08-06

## 2019-08-03 MED ORDER — TRAZODONE HCL 50 MG PO TABS
50.0000 mg | ORAL_TABLET | Freq: Every evening | ORAL | Status: DC | PRN
Start: 2019-08-03 — End: 2019-08-06
  Administered 2019-08-03 – 2019-08-05 (×3): 50 mg via ORAL
  Filled 2019-08-03 (×3): qty 1

## 2019-08-03 MED ORDER — ACETAMINOPHEN 325 MG PO TABS
650.0000 mg | ORAL_TABLET | Freq: Four times a day (QID) | ORAL | Status: DC | PRN
Start: 2019-08-03 — End: 2019-08-06

## 2019-08-03 MED ORDER — ENOXAPARIN SODIUM 40 MG/0.4ML SC SOLN
40.00 mg | Freq: Every day | SUBCUTANEOUS | Status: DC
Start: 2019-08-03 — End: 2019-08-05
  Administered 2019-08-03 – 2019-08-05 (×3): 40 mg via SUBCUTANEOUS
  Filled 2019-08-03 (×3): qty 0.4

## 2019-08-03 MED ORDER — ONDANSETRON HCL 4 MG/2ML IJ SOLN
4.00 mg | Freq: Four times a day (QID) | INTRAMUSCULAR | Status: DC | PRN
Start: 2019-08-03 — End: 2019-08-06

## 2019-08-03 MED ORDER — METOPROLOL SUCCINATE ER 25 MG PO TB24
25.00 mg | ORAL_TABLET | Freq: Every day | ORAL | Status: DC
Start: 2019-08-04 — End: 2019-08-04
  Administered 2019-08-04: 10:00:00 25 mg via ORAL
  Filled 2019-08-03: qty 1

## 2019-08-03 MED ORDER — ACETAMINOPHEN 650 MG RE SUPP
650.00 mg | Freq: Four times a day (QID) | RECTAL | Status: DC | PRN
Start: 2019-08-03 — End: 2019-08-06

## 2019-08-03 MED ORDER — FOLIC ACID 400 MCG PO TABS
400.0000 ug | ORAL_TABLET | Freq: Every day | ORAL | Status: DC
Start: 2019-08-04 — End: 2019-08-06
  Administered 2019-08-04 – 2019-08-06 (×3): 400 ug via ORAL
  Filled 2019-08-03 (×4): qty 1

## 2019-08-03 MED ORDER — DEXTROSE 50 % IV SOLN
12.50 g | INTRAVENOUS | Status: DC | PRN
Start: 2019-08-03 — End: 2019-08-06

## 2019-08-03 MED ORDER — AMLODIPINE BESYLATE 5 MG PO TABS
10.0000 mg | ORAL_TABLET | Freq: Every day | ORAL | Status: DC
Start: 2019-08-04 — End: 2019-08-06
  Administered 2019-08-04 – 2019-08-06 (×3): 10 mg via ORAL
  Filled 2019-08-03 (×3): qty 2

## 2019-08-03 MED ORDER — GLUCOSE 40 % PO GEL
15.00 g | ORAL | Status: DC | PRN
Start: 2019-08-03 — End: 2019-08-06

## 2019-08-03 MED ORDER — DULOXETINE HCL 30 MG PO CPEP
60.00 mg | ORAL_CAPSULE | Freq: Every day | ORAL | Status: DC
Start: 2019-08-04 — End: 2019-08-06
  Administered 2019-08-04 – 2019-08-06 (×3): 60 mg via ORAL
  Filled 2019-08-03 (×3): qty 2

## 2019-08-03 NOTE — ED Provider Notes (Signed)
EMERGENCY DEPARTMENT HISTORY AND PHYSICAL EXAM     Physician/Midlevel provider first contact with patient: 08/03/19 1006         Provider Note:       R/o chf, r/o nstemi, r/o covid, r/o pna, r/o electrolyte abn    Personal Protective Equipment (PPE)    Face Shield, Gloves, Goggles, N95, Pension scheme manager and Surgical / [employer] Cap      HISTORY OF PRESENT ILLNESS   Historian:Patient  Translator Used: No    68 y.o. male here for persistent sob for the past 1 month, worse over 1 wk. No cp. No n/v/d. Has had mulitple negative tests for covid and flu.     1. Location of symptoms: cv  2. Onset of symptoms: 1 wk or longer  3. What was patient doing when symptoms started (Context): nothing  4. Severity: moderate  5. Timing: intermittent  6. Activities that worsen symptoms: exertion  7. Activities that improve symptoms: rest  8. Quality:   9. Radiation of symptoms:  10. Associated signs and Symptoms: see above  11. Are symptoms worsening? yes        MEDICAL HISTORY     Past Medical History:  Past Medical History:   Diagnosis Date    Arrhythmia     when on ARB    Arthritis     psoriatic and osteoarthritis     Back pain     Blood disorder 11/2014    Hemochromatosis    Diabetes mellitus 2016    started metformin 05/2016 d/t A1C 7.1 - now checking BS 2x/day - average 70-110 in the last week - A1C on 06/29/16 = 6.3 , has also lost 16 lbs since 05/2016    Difficulty in walking(719.7)     Uses cane to ambulate    Elevated liver function tests     had abd u/s 07/05/16     Failed back syndrome     Fatty liver     Hemochromatosis     phlebotomy every 3 months - last done 02/2016    Hemochromatosis 09/2014    High cholesterol     Hypertensive disorder 06/2016    norvasc recently increased to 10 mg daily - now controlled on meds - most recent BP 117/73    Low back pain     Lung nodule 2012    benign - was monitored x  3 yrs by Dr. Bernita Raisin (Pulm) - signed off from pulmonology - no further f/u required since 2015    Neuropathy      Tingling left foot, numbness left toes    Psoriasis     Psoriatic arthritis     tx w/ enbrel    S/P insertion of spinal cord stimulator 2015    Sleep apnea     mild - does not have CPAP - was unable to tolerate, less snoring since turboplasty    Type 2 diabetes mellitus, controlled        Past Surgical History:  Past Surgical History:   Procedure Laterality Date    ABDOMINAL SURGERY  03/1971    double hernia repair    ARTHROPLASTY, KNEE, TOTAL Right 08/20/2016    Procedure: ARTHROPLASTY, KNEE, TOTAL;  Surgeon: Harriet Masson, MD;  Location: [facility];  Service: Orthopedics;  Laterality: Right;  RIGHT TOTAL KNEE REPLACEMENT    ARTHROSCOPY, KNEE Right 03/21/2016    Procedure: ARTHROSCOPY, KNEE, RIGHT, PARTIAL MEDIAL MENISCECTOMY, PARTIAL SYNOVECTOMY;  Surgeon: Verner Chol, MD;  Location: [facility];  Service: Orthopedics;  Laterality: Right;    CATARACT EXT.WITH IOL Bilateral 2009    FRACTURE SURGERY  1993    right middle finger    INSERTION, SPINAL CORD STIMULATOR GENERATOR N/A 05/07/2014    Procedure: DORSAL COLUMN STIMULATOR PLACEMENT;  Surgeon: Tera Helper, MD;  Location: [facility];  Service: Neurosurgery;  Laterality: N/A;  DCS PLACEMENT    KNEE ARTHROCENTESIS  1983    right    LUMBAR FUSION  10/2011    MICRODISCECTOMY LUMBAR  06/2011, 07/2011    SEPTOPLASTY  10/05/2015    SKIN BIOPSY  11/2015    Negative       Social History:  Social History     Socioeconomic History    Marital status: Married     Spouse name: Not on file    Number of children: Not on file    Years of education: Not on file    Highest education level: Not on file   Occupational History    Not on file   Social Needs    Financial resource strain: Not on file    Food insecurity     Worry: Not on file     Inability: Not on file    Transportation needs     Medical: Not on file     Non-medical: Not on file   Tobacco Use    Smoking status: Former Smoker     Packs/day: 1.00     Years: 41.00     Pack years:  41.00     Quit date: 09/26/2011     Years since quitting: 7.8    Smokeless tobacco: Never Used   Substance and Sexual Activity    Alcohol use: Yes     Alcohol/week: 2.0 standard drinks     Types: 4 Cans of beer per week    Drug use: No    Sexual activity: Yes     Partners: Female     Birth control/protection: Surgical   Lifestyle    Physical activity     Days per week: Not on file     Minutes per session: Not on file    Stress: Not on file   Relationships    Social connections     Talks on phone: Not on file     Gets together: Not on file     Attends religious service: Not on file     Active member of club or organization: Not on file     Attends meetings of clubs or organizations: Not on file     Relationship status: Not on file    Intimate partner violence     Fear of current or ex partner: Not on file     Emotionally abused: Not on file     Physically abused: Not on file     Forced sexual activity: Not on file   Other Topics Concern    Not on file   Social History Narrative    Not on file       Family History:  Family History   Problem Relation Age of Onset    Heart disease Mother     Heart disease Father        Allergies:  Allergies   Allergen Reactions    Angiotensin Receptor Blockers Other (See Comments)     ARB combined with Celebrex caused Arrhythmia due to high K+    Ciprofloxacin Swelling and Anaphylaxis     swelling  Ace Inhibitors Angioedema    Gabapentin Other (See Comments)     blurred vision, dizziness       Outpatient Medication:  Previous Medications    AMLODIPINE (NORVASC) 10 MG TABLET    Take 10 mg by mouth daily       ASPIRIN 81 MG TABLET    Take 81 mg by mouth daily.    CELECOXIB (CELEBREX) 200 MG CAPSULE    Take 200 mg by mouth 2 (two) times daily    DULOXETINE (CYMBALTA) 60 MG CAPSULE    Take 60 mg by mouth daily.    FOLIC ACID (FOLVITE) 400 MCG TABLET    Take 400 mcg by mouth daily.    HYDROCHLOROTHIAZIDE (HYDRODIURIL) 25 MG TABLET    Take 25 mg by mouth daily.       METFORMIN  (GLUCOPHAGE-XR) 500 MG 24 HR TABLET    TAKE 1 TABLET (500 MG TOTAL) BY MOUTH EVERY MORNING WITH BREAKFAST    METOPROLOL SUCCINATE XL (TOPROL-XL) 25 MG 24 HR TABLET    Take 25 mg by mouth daily       MILK THISTLE PO    Take by mouth    OMEGA-3 FATTY ACIDS (OMEGA-3 FISH OIL) 500 MG CAP    Take 1,400 mg by mouth daily       ONDANSETRON (ZOFRAN-ODT) 4 MG DISINTEGRATING TABLET    Take 1 tablet (4 mg total) by mouth every 6 (six) hours as needed for Nausea    ROSUVASTATIN (CRESTOR) 5 MG TABLET    Take 1 tablet (5 mg total) by mouth daily    SECUKINUMAB, 300 MG DOSE, (COSENTYX, 300 MG DOSE,) 150 MG/ML SOLUTION PREFILLED SYRINGE    Inject into the skin    TRAZODONE (DESYREL) 50 MG TABLET    Take 50 mg by mouth nightly as needed.             REVIEW OF SYSTEMS   ADD ROS  Review of Systems   Respiratory: Positive for shortness of breath.    All other systems reviewed and are negative.    PHYSICAL EXAM     08/03/19 0925 145/66 68 24 98.5 F (36.9 C) -- 100 % -- -- -- 93 kg 28.7 ALE     Nursing note and vitals reviewed.  Constitutional:  Well developed, well nourished. Awake & Oriented x3.  Head:  Atraumatic. Normocephalic.    Eyes:  PERRL. EOMI. Conjunctivae are not pale.  ENT:  Mucous membranes are moist and intact. Patent airway.  Neck:  Supple. Full ROM.    Cardiovascular:  Regular rate. Regular rhythm.  Respiratory:  No evidence of respiratory distress.   GI:  Soft and non-distended. There is no tenderness. No rebound, guarding, or rigidity.  Back:  Full ROM. Nontender.  MSK:  No edema. No cyanosis. No clubbing. Full range of motion in all extremities.  Skin:  Skin is warm and dry.  No diaphoresis. No rash.   Neurological:  Alert, awake, and appropriate. Normal speech. Motor normal.  Psychiatric:  Good eye contact. Normal interaction, affect, and behavior.      MEDICAL DECISION MAKING     Vital Signs: Reviewed the patients vital signs.   Nursing Notes: Reviewed and utilized available nursing notes.  Medical Records  Reviewed: Reviewed available past medical records.      PROCEDURES        CARDIAC STUDIES        Monitor Strip  Interpreted by ED Physician  Rate: 70  Rhythm: SR   ST Changes: none     EKG Interpretation:    9:29 NSR at 69 bpm, NAD, no LVH, qrs 98, qtc 422 (-) ST-T changes similar similar to ekg on 07/27/2019 as read by me.     IMAGING STUDIES      XR Chest AP Portable   Final Result    No acute pulmonary or pleural disease.      [name], MD    08/03/2019 11:16 AM             PULSE OXIMETRY    Oxygen Saturation by Pulse Oximetry: 98%  Interventions: none  Interpretation: normal     EMERGENCY DEPT. MEDICATIONS      ED Medication Orders (From admission, onward)    Start Ordered     Status Ordering Provider    08/04/19 2200 08/03/19 1401  rosuvastatin (CRESTOR) tablet 5 mg  At bedtime     Route: Oral  Ordered Dose: 5 mg     Ordered ABDULAAIMA, FARAH T    08/04/19 1000 08/03/19 1401  polyethylene glycol (MIRALAX) packet 17 g  Daily PRN     Route: Oral  Ordered Dose: 17 g     Ordered ABDULAAIMA, FARAH T    08/04/19 0900 08/03/19 1401  amLODIPine (NORVASC) tablet 10 mg  Daily     Route: Oral  Ordered Dose: 10 mg     Ordered ABDULAAIMA, FARAH T    08/04/19 0900 08/03/19 1401  aspirin EC tablet 81 mg  Daily     Route: Oral  Ordered Dose: 81 mg     Ordered ABDULAAIMA, FARAH T    08/04/19 0900 08/03/19 1401  DULoxetine (CYMBALTA) DR capsule 60 mg  Daily     Route: Oral  Ordered Dose: 60 mg     Ordered ABDULAAIMA, FARAH T    08/04/19 0900 08/03/19 1401  folic acid (FOLVITE) tablet 400 mcg  Daily     Route: Oral  Ordered Dose: 400 mcg     Ordered ABDULAAIMA, FARAH T    08/04/19 0900 08/03/19 1401  hydroCHLOROthiazide (HYDRODIURIL) tablet 25 mg  Daily     Route: Oral  Ordered Dose: 25 mg     Ordered ABDULAAIMA, FARAH T    08/04/19 0900 08/03/19 1401  metoprolol succinate XL (TOPROL-XL) 24 hr tablet 25 mg  Daily     Route: Oral  Ordered Dose: 25 mg     Ordered ABDULAAIMA, FARAH T    08/03/19 2200 08/03/19 1401  traZODone  (DESYREL) tablet 50 mg  At bedtime PRN     Route: Oral  Ordered Dose: 50 mg     Ordered ABDULAAIMA, FARAH T    08/03/19 1800 08/03/19 1401  celecoxib (CeleBREX) capsule 200 mg  2 times daily     Route: Oral  Ordered Dose: 200 mg     Ordered ABDULAAIMA, FARAH T    08/03/19 1402 08/03/19 1401  enoxaparin (LOVENOX) syringe 40 mg  Daily     Route: Subcutaneous  Ordered Dose: 40 mg     Ordered ABDULAAIMA, FARAH T    08/03/19 1358 08/03/19 1401  ondansetron (ZOFRAN-ODT) disintegrating tablet 4 mg  Every 6 hours PRN     Route: Oral  Ordered Dose: 4 mg     Ordered ABDULAAIMA, FARAH T    08/03/19 1358 08/03/19 1401  ondansetron (ZOFRAN) injection 4 mg  Every 6 hours PRN     Route: Intravenous  Ordered Dose: 4 mg  Raliegh Scarlet, [facility] T    08/03/19 1358 08/03/19 1401  acetaminophen (TYLENOL) tablet 650 mg  Every 6 hours PRN     Route: Oral  Ordered Dose: 650 mg     Raliegh Scarlet, FARAH T    08/03/19 1358 08/03/19 1401  acetaminophen (TYLENOL) suppository 650 mg  Every 6 hours PRN     Route: Rectal  Ordered Dose: 650 mg     Ordered ABDULAAIMA, FARAH T    08/03/19 1358 08/03/19 1401  naloxone (NARCAN) injection 0.2 mg  As needed     Route: Intravenous  Ordered Dose: 0.2 mg     Raliegh Scarlet, [facility] T    08/03/19 1327 08/03/19 1327  acetaminophen (TYLENOL) tablet 650 mg  once PRN     Route: Oral  Ordered Dose: 650 mg     Ordered Alphonsa Brickle SHU    08/03/19 1327 08/03/19 1327  ondansetron (ZOFRAN) injection 4 mg  once PRN     Route: Intravenous  Ordered Dose: 4 mg     Ordered Teneil Shiller SHU    08/03/19 1252 08/03/19 1251  aspirin chewable tablet 162 mg  Once     Route: Oral  Ordered Dose: 162 mg     Last MAR action: Given Chai Verdejo SHU          LABORATORY RESULTS    Ordered and independently interpreted AVAILABLE laboratory tests. Please see results section in chart for full details.  Results for orders placed or performed during the hospital encounter of 08/03/19   CBC and differential   Result Value Ref Range     WBC 8.23 3.10 - 9.50 x10 3/uL    Hgb 12.2 (L) 12.5 - 17.1 g/dL    Hematocrit 16.1 (L) 37.6 - 49.6 %    Platelets 294 142 - 346 x10 3/uL    RBC 3.71 (L) 4.20 - 5.90 x10 6/uL    MCV 94.3 78.0 - 96.0 fL    MCH 32.9 25.1 - 33.5 pg    MCHC 34.9 31.5 - 35.8 g/dL    RDW 13 11 - 15 %    MPV 9.6 8.9 - 12.5 fL    Neutrophils 51.4 None %    Lymphocytes Automated 32.9 None %    Monocytes 10.6 None %    Eosinophils Automated 2.7 None %    Basophils Automated 1.1 None %    Immature Granulocytes 1.3 None %    Nucleated RBC 0.0 0.0 - 0.0 /100 WBC    Neutrophils Absolute 4.23 1.10 - 6.33 x10 3/uL    Lymphocytes Absolute Automated 2.71 0.42 - 3.22 x10 3/uL    Monocytes Absolute Automated 0.87 (H) 0.21 - 0.85 x10 3/uL    Eosinophils Absolute Automated 0.22 0.00 - 0.44 x10 3/uL    Basophils Absolute Automated 0.09 (H) 0.00 - 0.08 x10 3/uL    Immature Granulocytes Absolute 0.11 (H) 0.00 - 0.07 x10 3/uL    Absolute NRBC 0.00 0.00 - 0.00 x10 3/uL   Comprehensive metabolic panel   Result Value Ref Range    Glucose 167 (H) 70 - 100 mg/dL    BUN 09.6 9.0 - 04.5 mg/dL    Creatinine 0.9 0.7 - 1.3 mg/dL    Sodium 409 811 - 914 mEq/L    Potassium 3.8 3.5 - 5.1 mEq/L    Chloride 104 100 - 111 mEq/L    CO2 20 (L) 22 - 29 mEq/L    Calcium 9.4 8.5 - 10.5 mg/dL  Protein, Total 7.0 6.0 - 8.3 g/dL    Albumin 4.0 3.5 - 5.0 g/dL    AST (SGOT) 79 (H) 5 - 34 U/L    ALT 115 (H) 0 - 55 U/L    Alkaline Phosphatase 89 38 - 106 U/L    Bilirubin, Total 0.7 0.2 - 1.2 mg/dL    Globulin 3.0 2.0 - 3.6 g/dL    Albumin/Globulin Ratio 1.3 0.9 - 2.2    Anion Gap 13.0 5.0 - 15.0   Lipase   Result Value Ref Range    Lipase 44 8 - 78 U/L   Troponin I   Result Value Ref Range    Troponin I 0.02 0.00 - 0.05 ng/mL   B-type Natriuretic Peptide   Result Value Ref Range    B-Natriuretic Peptide 77 0 - 100 pg/mL   PT/APTT   Result Value Ref Range    PT See Note 12.6 - 15.0 sec    PT INR See Note 0.9 - 1.1    PTT see comment 23 - 37 sec   Hemolysis index   Result Value Ref  Range    Hemolysis Index 34 (H) 0 - 18   GFR   Result Value Ref Range    EGFR >60.0    Arterial Blood Gas (ABG)   Result Value Ref Range    pH, Arterial 7.465 (H) 7.350 - 7.450    pCO2, Arterial 30.5 (L) 35.0 - 45.0 mmHg    pO2, Arterial 83.6 80.0 - 90.0 mmHg    HCO3, Arterial 21.7 (L) 23.0 - 29.0 mEq/L    Arterial Total CO2 43.4 (H) 24.0 - 30.0 mEq/L    Base Excess, Arterial -0.9 -2.0 - 2.0 mEq/L    O2 Sat, Arterial 96.2 95.0 - 100.0 %    ABG CollectionSite rr     Allen's Test y     Temperature 37.0     FIO2 ra %    Status ra     O2 Delivery ra     O2 Flow / L/min    Rate / BPM    Mode: /     Pressure Control /     ETCO2 /     Minute Ventilation / L/min    PEEP /     Pressure Support /     Tidal vol. /    PT/APTT   Result Value Ref Range    PT 12.6 12.6 - 15.0 sec    PT INR 1.0 0.9 - 1.1    PTT 22 (L) 23 - 37 sec   D-Dimer   Result Value Ref Range    D-Dimer 0.43 0.00 - 0.70 ug/mL FEU       CLINICAL DECISION SUPPORT         ED Course      12:54 d/w Dr. Georgeanna Lea, card, will consult.     D/w Dr. Cala Bradford, sound, will admit.     Pt agrees w/ admission.       For Hospitalized Patients:    1. Hospitalization Decision Time:  The decision to admit this patient was made by the emergency provider at 12:59 pm time] on 08/03/2019     2. Aspirin: Aspirin was given    3. Core Measures: n/a         ATTESTATIONS      Physician Attestation: Cecilie Kicks Inge Waldroup , have been the primary provider for Haywood Pao during this Emergency Dept visit and have reviewed the chart  documented for accuracy and agree with its content.       DIAGNOSIS      Diagnosis:  Final diagnoses:   SOB (shortness of breath)       Disposition:  ED Disposition     ED Disposition Condition Date/Time Comment    Observation  Mon Aug 03, 2019 12:59 PM Admitting Physician: Jolyn Lent [[id number]]   Service:: Medicine [[id number]]   Estimated Length of Stay: < 2 midnights   Tentative Discharge Plan?: Home or Self Care [1]   Does patient need telemetry?: Yes   Telemetry  type (separate Telemetry order is also required):: Medical telemetry   Isolation and Infection:: Droplet   Isolation and Infection:: Contact            Prescriptions:  Patient's Medications   New Prescriptions    No medications on file   Previous Medications    AMLODIPINE (NORVASC) 10 MG TABLET    Take 10 mg by mouth daily       ASPIRIN 81 MG TABLET    Take 81 mg by mouth daily.    CELECOXIB (CELEBREX) 200 MG CAPSULE    Take 200 mg by mouth 2 (two) times daily    DULOXETINE (CYMBALTA) 60 MG CAPSULE    Take 60 mg by mouth daily.    FOLIC ACID (FOLVITE) 400 MCG TABLET    Take 400 mcg by mouth daily.    HYDROCHLOROTHIAZIDE (HYDRODIURIL) 25 MG TABLET    Take 25 mg by mouth daily.       METFORMIN (GLUCOPHAGE-XR) 500 MG 24 HR TABLET    TAKE 1 TABLET (500 MG TOTAL) BY MOUTH EVERY MORNING WITH BREAKFAST    METOPROLOL SUCCINATE XL (TOPROL-XL) 25 MG 24 HR TABLET    Take 25 mg by mouth daily       MILK THISTLE PO    Take by mouth    OMEGA-3 FATTY ACIDS (OMEGA-3 FISH OIL) 500 MG CAP    Take 1,400 mg by mouth daily       ONDANSETRON (ZOFRAN-ODT) 4 MG DISINTEGRATING TABLET    Take 1 tablet (4 mg total) by mouth every 6 (six) hours as needed for Nausea    ROSUVASTATIN (CRESTOR) 5 MG TABLET    Take 1 tablet (5 mg total) by mouth daily    SECUKINUMAB, 300 MG DOSE, (COSENTYX, 300 MG DOSE,) 150 MG/ML SOLUTION PREFILLED SYRINGE    Inject into the skin    TRAZODONE (DESYREL) 50 MG TABLET    Take 50 mg by mouth nightly as needed.       Modified Medications    No medications on file   Discontinued Medications    ACETAMINOPHEN (TYLENOL) 325 MG TABLET    Take 1 tablet (325 mg total) by mouth every 6 (six) hours as needed for Pain    IBUPROFEN (ADVIL,MOTRIN) 600 MG TABLET    Take 1 tablet (600 mg total) by mouth every 8 (eight) hours as needed for Pain.              Jarod Bozzo, Zadie Rhine, MD PhD  08/03/19 [phone]

## 2019-08-03 NOTE — H&P (Signed)
[facility]      Patient: Kent Jordan  Date: 08/03/2019   DOB: 17-Feb-1951  Admission Date: 08/03/2019   MRN: [id number]  Attending: Jolyn Lent         Chief Complaint   Patient presents with    Shortness of Breath      History Gathered From: the patient and ER physician     HISTORY AND PHYSICAL     Kent Jordan is a 68 y.o. male with a PMHx of HTN, DM type 2 , hemochromatosis, arrhythmia  who presented with chest tightness for 1 week duration associated with SOB on exertion, with tachypnea, nothing make it better, worse with ambulation, associated with generalized fatigue, subjective fever,   He denies CP, nausea, vomiting, abdominal pain, diarrhea or constipation, focal weakness, numbness, sore throat , runny nose.     He was seen by his PCP and has been prescribed 2 rounds of AB with no improvement in symptoms.     Patient denies any change in environment, no new allergens  Former heavy smoker, quit 8 years ago, smoked for 40 years, no know history of COPD.    He had 3 COVID-19 test in the last 2 weeks and was negative.     Past Medical History:   Diagnosis Date    Arrhythmia     when on ARB    Arthritis     psoriatic and osteoarthritis     Back pain     Blood disorder 11/2014    Hemochromatosis    Diabetes mellitus 2016    started metformin 05/2016 d/t A1C 7.1 - now checking BS 2x/day - average 70-110 in the last week - A1C on 06/29/16 = 6.3 , has also lost 16 lbs since 05/2016    Difficulty in walking(719.7)     Uses cane to ambulate    Elevated liver function tests     had abd u/s 07/05/16     Failed back syndrome     Fatty liver     Hemochromatosis     phlebotomy every 3 months - last done 02/2016    Hemochromatosis 09/2014    High cholesterol     Hypertensive disorder 06/2016    norvasc recently increased to 10 mg daily - now controlled on meds - most recent BP 117/73    Low back pain     Lung nodule 2012    benign - was monitored x  3 yrs by Dr. Bernita Raisin (Pulm) - signed off from  pulmonology - no further f/u required since 2015    Neuropathy     Tingling left foot, numbness left toes    Psoriasis     Psoriatic arthritis     tx w/ enbrel    S/P insertion of spinal cord stimulator 2015    Sleep apnea     mild - does not have CPAP - was unable to tolerate, less snoring since turboplasty    Type 2 diabetes mellitus, controlled        Past Surgical History:   Procedure Laterality Date    ABDOMINAL SURGERY  03/1971    double hernia repair    ARTHROPLASTY, KNEE, TOTAL Right 08/20/2016    Procedure: ARTHROPLASTY, KNEE, TOTAL;  Surgeon: Harriet Masson, MD;  Location: [facility];  Service: Orthopedics;  Laterality: Right;  RIGHT TOTAL KNEE REPLACEMENT    ARTHROSCOPY, KNEE Right 03/21/2016    Procedure: ARTHROSCOPY, KNEE, RIGHT, PARTIAL MEDIAL MENISCECTOMY, PARTIAL SYNOVECTOMY;  Surgeon:  Verner Chol, MD;  Location: [facility];  Service: Orthopedics;  Laterality: Right;    CATARACT EXT.WITH IOL Bilateral 2009    FRACTURE SURGERY  1993    right middle finger    INSERTION, SPINAL CORD STIMULATOR GENERATOR N/A 05/07/2014    Procedure: DORSAL COLUMN STIMULATOR PLACEMENT;  Surgeon: Tera Helper, MD;  Location: [facility];  Service: Neurosurgery;  Laterality: N/A;  DCS PLACEMENT    KNEE ARTHROCENTESIS  1983    right    LUMBAR FUSION  10/2011    MICRODISCECTOMY LUMBAR  06/2011, 07/2011    SEPTOPLASTY  10/05/2015    SKIN BIOPSY  11/2015    Negative       Prior to Admission medications    Medication Sig Start Date End Date Taking? Authorizing Provider   MILK THISTLE PO Take by mouth   Yes [provider]   acetaminophen (TYLENOL) 325 MG tablet Take 1 tablet (325 mg total) by mouth every 6 (six) hours as needed for Pain 07/27/19   Lynita Lombard, PA   amLODIPine (NORVASC) 10 MG tablet Take 10 mg by mouth daily    09/03/16   [provider]   aspirin 81 MG tablet Take 81 mg by mouth daily.    [provider]   azithromycin (ZITHROMAX) 250 MG tablet  Take 2 tablets (500 mg) on  Day 1,  followed by 1 tablet (250 mg) once daily on Days 2 through 5. 07/27/19 08/02/19  Erasmo Score, MD   celecoxib (CeleBREX) 200 MG capsule Take 200 mg by mouth 2 (two) times daily    [provider]   DULoxetine (CYMBALTA) 60 MG capsule Take 60 mg by mouth daily.    [provider]   folic acid (FOLVITE) 400 MCG tablet Take 400 mcg by mouth daily.    [provider]   hydroCHLOROthiazide (HYDRODIURIL) 25 MG tablet Take 25 mg by mouth daily.    12/15/15   [provider]   ibuprofen (ADVIL,MOTRIN) 600 MG tablet Take 1 tablet (600 mg total) by mouth every 8 (eight) hours as needed for Pain. 07/17/17   Jeraldine Loots, MD   metFORMIN (GLUCOPHAGE-XR) 500 MG 24 hr tablet TAKE 1 TABLET (500 MG TOTAL) BY MOUTH EVERY MORNING WITH BREAKFAST 07/06/19 10/04/19  Erasmo Score, MD   metoprolol succinate XL (TOPROL-XL) 25 MG 24 hr tablet Take 25 mg by mouth daily    06/10/18   [provider]   Omega-3 Fatty Acids (Omega-3 Fish Oil) 500 MG Cap Take 1,400 mg by mouth daily       [provider]   ondansetron (ZOFRAN-ODT) 4 MG disintegrating tablet Take 1 tablet (4 mg total) by mouth every 6 (six) hours as needed for Nausea 07/27/19   Lynita Lombard, PA   rosuvastatin (CRESTOR) 5 MG tablet Take 1 tablet (5 mg total) by mouth daily 03/03/19   Erasmo Score, MD   Secukinumab, 300 MG Dose, (Cosentyx, 300 MG Dose,) 150 MG/ML Solution Prefilled Syringe Inject into the skin    [provider]   traZODone (DESYREL) 50 MG tablet Take 50 mg by mouth nightly as needed.     10/25/15   [provider]       Allergies   Allergen Reactions    Angiotensin Receptor Blockers Other (See Comments)     ARB combined with Celebrex caused Arrhythmia due to high K+    Ciprofloxacin Swelling and Anaphylaxis     swelling  Ace Inhibitors Angioedema    Gabapentin Other (See Comments)     blurred vision, dizziness       CODE STATUS: full code     PRIMARY CARE MD:  Erasmo Score, MD    Family History   Problem Relation Age of Onset    Heart disease Mother     Heart disease Father        Social History     Tobacco Use    Smoking status: Former Smoker     Packs/day: 1.00     Years: 41.00     Pack years: 41.00     Quit date: 09/26/2011     Years since quitting: 7.8    Smokeless tobacco: Never Used   Substance Use Topics    Alcohol use: Yes     Alcohol/week: 2.0 standard drinks     Types: 4 Cans of beer per week    Drug use: No       REVIEW OF SYSTEMS   Positive for: fatigue , chest tightness, dyspnea on exertion   Negative for: CP, nausea, vomiting, abdominal pain, diarrhea or constipation, focal weakness, numbness, sore throat , runny nose.   All ROS completed and otherwise negative.    PHYSICAL EXAM     Vital Signs (most recent): BP 147/70    Pulse 73    Temp 98.5 F (36.9 C)    Resp 14    Ht 1.803 m (5\' 11" )    Wt 93 kg (205 lb)    SpO2 97%    BMI 28.59 kg/m   Constitutional: tachypnea Patient speaks freely in full sentences but get out of breath after finishing sentence   HEENT: NC/AT, PERRL, no scleral icterus or conjunctival pallor, no nasal discharge, MMM, oropharynx without erythema or exudate  Neck: trachea midline, supple, no cervical or supraclavicular lymphadenopathy or masses  Cardiovascular: RRR, normal S1 S2, no murmurs, gallops, palpable thrills, no JVD, Non-displaced PMI.  Respiratory: Normal rate. No retractions or increased work of breathing. Clear to auscultation and percussion bilaterally.  Gastrointestinal: +BS, non-distended, soft, non-tender, no rebound or guarding, no hepatosplenomegaly  Genitourinary: no suprapubic or costovertebral angle tenderness  Musculoskeletal: ROM and motor strength grossly normal. No clubbing, edema, or cyanosis. DP and radial pulses 2+ and symmetric.  Skin exam:  pink  Neurologic: EOMI, CN 2-12 grossly intact. no gross motor or sensory deficits  Psychiatric: AAOx3, affect and mood appropriate. The patient is alert,  interactive, appropriate.  Capillary refill:  Normal    Exam done by Jolyn Lent, MD on 08/03/19 at 1:27 PM      LABS & IMAGING     Recent Results (from the past 24 hour(s))   CBC and differential    Collection Time: 08/03/19 11:20 AM   Result Value Ref Range    WBC 8.23 3.10 - 9.50 x10 3/uL    Hgb 12.2 (L) 12.5 - 17.1 g/dL    Hematocrit 09.8 (L) 37.6 - 49.6 %    Platelets 294 142 - 346 x10 3/uL    RBC 3.71 (L) 4.20 - 5.90 x10 6/uL    MCV 94.3 78.0 - 96.0 fL    MCH 32.9 25.1 - 33.5 pg    MCHC 34.9 31.5 - 35.8 g/dL    RDW 13 11 - 15 %    MPV 9.6 8.9 - 12.5 fL    Neutrophils 51.4 None %    Lymphocytes Automated 32.9 None %    Monocytes 10.6 None %  Eosinophils Automated 2.7 None %    Basophils Automated 1.1 None %    Immature Granulocytes 1.3 None %    Nucleated RBC 0.0 0.0 - 0.0 /100 WBC    Neutrophils Absolute 4.23 1.10 - 6.33 x10 3/uL    Lymphocytes Absolute Automated 2.71 0.42 - 3.22 x10 3/uL    Monocytes Absolute Automated 0.87 (H) 0.21 - 0.85 x10 3/uL    Eosinophils Absolute Automated 0.22 0.00 - 0.44 x10 3/uL    Basophils Absolute Automated 0.09 (H) 0.00 - 0.08 x10 3/uL    Immature Granulocytes Absolute 0.11 (H) 0.00 - 0.07 x10 3/uL    Absolute NRBC 0.00 0.00 - 0.00 x10 3/uL   Comprehensive metabolic panel    Collection Time: 08/03/19 11:20 AM   Result Value Ref Range    Glucose 167 (H) 70 - 100 mg/dL    BUN 35.0 9.0 - 09.3 mg/dL    Creatinine 0.9 0.7 - 1.3 mg/dL    Sodium 818 299 - 371 mEq/L    Potassium 3.8 3.5 - 5.1 mEq/L    Chloride 104 100 - 111 mEq/L    CO2 20 (L) 22 - 29 mEq/L    Calcium 9.4 8.5 - 10.5 mg/dL    Protein, Total 7.0 6.0 - 8.3 g/dL    Albumin 4.0 3.5 - 5.0 g/dL    AST (SGOT) 79 (H) 5 - 34 U/L    ALT 115 (H) 0 - 55 U/L    Alkaline Phosphatase 89 38 - 106 U/L    Bilirubin, Total 0.7 0.2 - 1.2 mg/dL    Globulin 3.0 2.0 - 3.6 g/dL    Albumin/Globulin Ratio 1.3 0.9 - 2.2    Anion Gap 13.0 5.0 - 15.0   Lipase    Collection Time: 08/03/19 11:20 AM   Result Value Ref Range    Lipase 44 8 -  78 U/L   Troponin I    Collection Time: 08/03/19 11:20 AM   Result Value Ref Range    Troponin I 0.02 0.00 - 0.05 ng/mL   B-type Natriuretic Peptide    Collection Time: 08/03/19 11:20 AM   Result Value Ref Range    B-Natriuretic Peptide 77 0 - 100 pg/mL   PT/APTT    Collection Time: 08/03/19 11:20 AM   Result Value Ref Range    PT 12.1 (L) 12.6 - 15.0 sec    PT INR 0.9 0.9 - 1.1   Hemolysis index    Collection Time: 08/03/19 11:20 AM   Result Value Ref Range    Hemolysis Index 34 (H) 0 - 18   GFR    Collection Time: 08/03/19 11:20 AM   Result Value Ref Range    EGFR >60.0    Arterial Blood Gas (ABG)    Collection Time: 08/03/19 11:35 AM   Result Value Ref Range    pH, Arterial 7.465 (H) 7.350 - 7.450    pCO2, Arterial 30.5 (L) 35.0 - 45.0 mmHg    pO2, Arterial 83.6 80.0 - 90.0 mmHg    HCO3, Arterial 21.7 (L) 23.0 - 29.0 mEq/L    Arterial Total CO2 43.4 (H) 24.0 - 30.0 mEq/L    Base Excess, Arterial -0.9 -2.0 - 2.0 mEq/L    O2 Sat, Arterial 96.2 95.0 - 100.0 %    ABG CollectionSite rr     Allen's Test y     Temperature 37.0     FIO2 ra %    Status ra     O2  Delivery ra     O2 Flow / L/min    Rate / BPM    Mode: /     Pressure Control /     ETCO2 /     Minute Ventilation / L/min    PEEP /     Pressure Support /     Tidal vol. /    PT/APTT    Collection Time: 08/03/19 12:21 PM   Result Value Ref Range    PT 12.6 12.6 - 15.0 sec    PT INR 1.0 0.9 - 1.1    PTT 22 (L) 23 - 37 sec   D-Dimer    Collection Time: 08/03/19 12:21 PM   Result Value Ref Range    D-Dimer 0.43 0.00 - 0.70 ug/mL FEU       IMAGING:  Upon my review:   Ct Abd/pelvis Without Contrast    Result Date: 07/27/2019   1. No renal stone or hydronephrosis. 2. No evidence of acute intraperitoneal process in this unenhanced study. 3. Post surgical changes of the lumbar spine and multilevel degenerative changes Heron Nay, MD  07/27/2019 7:51 PM    Ct Head Without Contrast    Result Date: 07/27/2019  No acute abnormality detected. Carleene Overlie, MD  07/27/2019  7:40 PM    Xr Chest Ap Portable    Result Date: 08/03/2019   No acute pulmonary or pleural disease. [name], MD  08/03/2019 11:16 AM    Chest Ap Portable    Result Date: 07/27/2019   No acute process. Heron Nay, MD  07/27/2019 7:16 PM      CARDIAC:  EKG Interpretation (upon my review):  NSR at rate of 69 BPM with ST changes similar to old EKG.     Markers:  Recent Labs   Lab 08/03/19  1120 07/27/19  1956   Troponin I 0.02 0.03       EMERGENCY DEPARTMENT COURSE:  Orders Placed This Encounter   Procedures    COVID-19 (SARS-COV-2) ([place] Standard test)    XR Chest AP Portable    CBC and differential    Comprehensive metabolic panel    Lipase    Troponin I    B-type Natriuretic Peptide    PT/APTT    Hemolysis index    GFR    Arterial Blood Gas (ABG)    PT/APTT    D-Dimer    Diet regular    Diet PO Challenge    ED Holding Orders Expire in 12 Hours    Notify Admitting Attending ( Change in Condition)    Notify Attending of Patient Arrival to Floor within 12 Hours    Notify Physician (Vital Signs)    Notify Physician (Lab Results)    Vital Signs Q4HR    Telemetry 24 Hour Protocol    ED Unit Sec Comm Order    ED Unit Sec Comm Order    Isolation-Droplet    Isolation-Contact    ECG 12 Lead    Admit to Observation       ASSESSMENT & PLAN     DEVAM CERAVOLO is a 68 y.o. male admitted under [facility] with Dyspnea on exertion, chest tightness DDx cardiac, infectious     Tele monitoring   Serial cardiac enzymes   D dimer negative ruling out PE   Check echocardiogram   COVID-19 test negative X3 will check COVID-19   Contact and droplet isolation   Cardiology Dr Georgeanna Lea consulted by ER.  Essential hypertension (04/15/2014)  Chronic   Well controlled   Continue home meds      Type 2 diabetes mellitus without complication, without long-term current use of insulin (12/07/2016)  HbA1C 6.9 on 07/15/2019   Hold metformin   POCT FS with Lispro SS      Overweight (BMI 25.0-29.9)  (09/11/2017)  Advise wt loss     Hemochromatosis   Stable     Alcohol abuse   3-4 beers daily   CIWA and ativan as per CIWA       Nutrition  Cardiac carb consistent diet     DVT/VTE Prophylaxis  lovenox     Anticipated medical stability for discharge: 1-2 night     Service status/Reason for ongoing hospitalization: observation/ DOE, chest tightness     Anticipated Discharge Needs: TBD     Terrace Arabia    08/03/2019 1:27 PM  Time Elapsed: 65 minutes

## 2019-08-03 NOTE — ED Notes (Signed)
[facility] EMERGENCY DEPARTMENT  ED NURSING NOTE FOR THE RECEIVING INPATIENT NURSE   ED NURSE Francis Dowse [phone]   ED CHARGE RN [phone]   ADMISSION INFORMATION   Kent Jordan is a 68 y.o. male admitted with a diagnosis of:    1. SOB (shortness of breath)         Isolation: Contact COVID r/o and Droplet   Allergies: Angiotensin receptor blockers, Ciprofloxacin, Ace inhibitors, and Gabapentin   Holding Orders confirmed? Yes   Belongings Documented? Yes   Home medications sent to pharmacy confirmed? No   NURSING CARE   Patient Comes From:   Mental Status: Home Independent  alert and oriented   ADL: Needs assistance with ADLs   Ambulation: ambulates with: cane   Pertinent Information  and Safety Concerns: Pt has SOB with talking or movement. No fevers.      VITAL SIGNS   Time BP Temp Pulse Resp SpO2   1223 147/70 98.5 73 14 97   CT / NIH   CT Head ordered on this patient?  No   NIH/Dysphagia assessment done prior to admission? No   PERSONAL PROTECTIVE EQUIPMENT   Face Shield, Gloves, Goggles, N95 and Surgical / [place] Cap   LAB RESULTS   Labs Reviewed   CBC AND DIFFERENTIAL - Abnormal; Notable for the following components:       Result Value    Hgb 12.2 (*)     Hematocrit 35.0 (*)     RBC 3.71 (*)     Monocytes Absolute Automated 0.87 (*)     Basophils Absolute Automated 0.09 (*)     Immature Granulocytes Absolute 0.11 (*)     All other components within normal limits   COMPREHENSIVE METABOLIC PANEL - Abnormal; Notable for the following components:    Glucose 167 (*)     CO2 20 (*)     AST (SGOT) 79 (*)     ALT 115 (*)     All other components within normal limits   PT AND APTT - Abnormal; Notable for the following components:    PT 12.1 (*)     All other components within normal limits   HEMOLYSIS INDEX - Abnormal; Notable for the following components:    Hemolysis Index 34 (*)     All other components within normal limits   BLOOD GAS, ARTERIAL - Abnormal; Notable for the following components:    pH, Arterial  7.465 (*)     pCO2, Arterial 30.5 (*)     HCO3, Arterial 21.7 (*)     Arterial Total CO2 43.4 (*)     All other components within normal limits   PT AND APTT - Abnormal; Notable for the following components:    PTT 22 (*)     All other components within normal limits   COVID-19 (SARS-COV-2)    Narrative:     o Collect and clearly label specimen type:  o PREFERRED-Upper respiratory specimen: One Nasopharyngeal  Swab in Transport Media.  o Hand deliver to laboratory ASAP   LIPASE   TROPONIN I   B-TYPE NATRIURETIC PEPTIDE   GFR   [facility] D-DIMER          Ticket to Ride Printed: Yes

## 2019-08-03 NOTE — ED Triage Notes (Signed)
Pt presents with SOB for about 1 week. Pt has been tested for COVID twice and the flu and both came back negative. Reports dizziness and some chest tightness with movement.

## 2019-08-03 NOTE — Plan of Care (Signed)
Discharge Recommendation: Home with no needs  DME Recommended for Discharge: No additional equipment/DME recommended at this time    Is an Occupational Therapy Evaluation Indicated at this time? No, the patient does not require a OT evaluation.    Treatment/Interventions: No skilled interventions needed at this time  PT Frequency: one time visit - therapy discontinued     Next Visit Recommendation:       PMP Activity: Step 6 - Walks in Room  Distance Walked (ft) (Step 6,7): 90 Feet  (Please See Therapy Evaluation for device and assistance level needed)    Goals:

## 2019-08-03 NOTE — PT Eval Note (Signed)
[facility]  Physical Therapy Evaluation and D/C     Patient: Kent Jordan  MRN#: [id number]  Unit: [facility] MEDICAL ONCOLOGY RENAL  Bed: A2103/A2103-A    Time of Evaluation:  Time Calculation  PT Received On: 08/03/19  Start Time: 1625  Stop Time: 1639  Time Calculation (min): 14 min    Chart Review and Collaboration with Care Team: 5 minutes, not included in above time.    PT Visit Number: 1    Consult received for Kent Jordan for PT Evaluation and Treatment.  Patients medical condition is appropriate for Physical therapy intervention at this time.    Activity Orders:  PT eval and treat and progressive mobility protocol    Precautions and Contraindications:  Precautions  Other Precautions: droplet , contact    Personal Protective Equipment (PPE)  gloves, procedure gown, procedure mask, helmet with face shield and pt wore procedure mask    Medical Diagnosis:  SOB (shortness of breath) [R06.02]    History of Present Illness:  Kent Jordan is a 68 y.o. male admitted on 08/03/2019 with SOB.    Patient Active Problem List   Diagnosis    Mixed hyperlipidemia    Essential hypertension    Bigeminal rhythm    Arrhythmia    Primary osteoarthritis of right knee    Status post right knee replacement    Type 2 diabetes mellitus without complication, without long-term current use of insulin    Other hemochromatosis    Acute bilateral low back pain with sciatica, sciatica laterality unspecified    Anterior knee pain, right    Overweight (BMI 25.0-29.9)    Elevated LFTs    Psoriatic arthritis    SOB (shortness of breath)    Dyspnea on exertion    Chest tightness       Past Medical/Surgical History:  Past Medical History:   Diagnosis Date    Arrhythmia     when on ARB    Arthritis     psoriatic and osteoarthritis     Back pain     Blood disorder 11/2014    Hemochromatosis    Diabetes mellitus 2016    started metformin 05/2016 d/t A1C 7.1 - now checking BS 2x/day - average 70-110 in the last  week - A1C on 06/29/16 = 6.3 , has also lost 16 lbs since 05/2016    Difficulty in walking(719.7)     Uses cane to ambulate    Elevated liver function tests     had abd u/s 07/05/16     Failed back syndrome     Fatty liver     Hemochromatosis     phlebotomy every 3 months - last done 02/2016    Hemochromatosis 09/2014    High cholesterol     Hypertensive disorder 06/2016    norvasc recently increased to 10 mg daily - now controlled on meds - most recent BP 117/73    Low back pain     Lung nodule 2012    benign - was monitored x  3 yrs by Dr. Bernita Raisin (Pulm) - signed off from pulmonology - no further f/u required since 2015    Neuropathy     Tingling left foot, numbness left toes    Psoriasis     Psoriatic arthritis     tx w/ enbrel    S/P insertion of spinal cord stimulator 2015    Sleep apnea     mild - does not have CPAP -  was unable to tolerate, less snoring since turboplasty    Type 2 diabetes mellitus, controlled      Past Surgical History:   Procedure Laterality Date    ABDOMINAL SURGERY  03/1971    double hernia repair    ARTHROPLASTY, KNEE, TOTAL Right 08/20/2016    Procedure: ARTHROPLASTY, KNEE, TOTAL;  Surgeon: Harriet Masson, MD;  [facility];  Service: Orthopedics;  Laterality: Right;  RIGHT TOTAL KNEE REPLACEMENT    ARTHROSCOPY, KNEE Right 03/21/2016    Procedure: ARTHROSCOPY, KNEE, RIGHT, PARTIAL MEDIAL MENISCECTOMY, PARTIAL SYNOVECTOMY;  Surgeon: Verner Chol, MD;  Location: [facility];  Service: Orthopedics;  Laterality: Right;    CATARACT EXT.WITH IOL Bilateral 2009    FRACTURE SURGERY  1993    right middle finger    INSERTION, SPINAL CORD STIMULATOR GENERATOR N/A 05/07/2014    Procedure: DORSAL COLUMN STIMULATOR PLACEMENT;  Surgeon: Tera Helper, MD;  Location: [facility];  Service: Neurosurgery;  Laterality: N/A;  DCS PLACEMENT    KNEE ARTHROCENTESIS  1983    right    LUMBAR FUSION  10/2011    MICRODISCECTOMY LUMBAR  06/2011, 07/2011    SEPTOPLASTY   10/05/2015    SKIN BIOPSY  11/2015    Negative       X-Rays/Tests/Labs:  Lab Results   Component Value Date/Time    HGB 12.2 (L) 08/03/2019 11:20 AM    HCT 35.0 (L) 08/03/2019 11:20 AM    K 3.8 08/03/2019 11:20 AM    NA 137 08/03/2019 11:20 AM    INR 1.0 08/03/2019 12:21 PM    TROPI 0.02 08/03/2019 11:20 AM       All imaging reviewed, please see chart for details.    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 Nursing clears patient for therapy.  Patient Goal: Find out why SOB  Pain Assessment  Pain Assessment: No/denies pain      Objective:  Observation of Patient/Vital Signs:  Vitals:    08/03/19 1626   BP: 142/77   Pulse: 69   Resp: 16   Temp: 98.2 F (36.8 C)   SpO2:          Patient received out of bed, ambulating with No Medical Equipment in place.         Cognitive Status and Neuro Exam:  Cognition/Neuro Status  Arousal/Alertness: Appropriate responses to stimuli  Attention Span: Appears intact  Orientation Level: Oriented X4  Memory: Appears intact  Following Commands: Follows all commands and directions without difficulty  Safety Awareness: minimal verbal instruction  Insights: Fully aware of deficits;Educated in safety awareness  Behavior: calm;cooperative  Motor Planning: intact    Musculoskeletal Examination  Gross ROM  Right Upper Extremity ROM: within functional limits  Left Upper Extremity ROM: within functional limits  Right Lower Extremity ROM: within functional limits  Left Lower Extremity ROM:  within functional limits  Gross Strength  Right Upper Extremity Strength: within functional limits  Left Upper Extremity Strength: within functional limits  Right Lower  Extremity Strength: within functional limits  Left Lower Extremity Strength: within functional limits       Functional Mobility:  Functional Mobility  Rolling: Independent  Supine to Sit: Modified Independent  Scooting to HOB: Modified independent  Scooting to EOB: Modified Independent  Sit to Supine: Independent  Sit to Stand: Modified Independent  Stand to Sit: Independent     Locomotion  Ambulation: Modified Independent;with single point cane  Pattern: within functional limits     Balance  Balance  Balance: within functional limits(with SPC)    Participation and Activity Tolerance  Participation and Endurance  Participation Effort: good  [place] Dyspnea Scale: 2+ Dyspnea    Educated the patient to role of physical therapy, plan of care, goals of therapy and HEP, safety with mobility and ADLs, pursed lip breathing.  Discussed importance of OOB in chair for all meals and continued amb in room.  Reviewed PLB and pt able to do without difficulty.    Patient left in bed with call bell and all personal items/needs within reach. RN notified of session outcome.      Assessment:  Kent Jordan is a 68 y.o. male admitted 08/03/2019. Patient is at baseline and has no inpatient skilled PT needs at this time. There are few comorbidities or other factors that affect plan of care and require modification of task including:vertigo/dizziness, assistive device at baseline.  Pt's functional mobility is not impacted at this time.   Standardized tests and exams incorporated into evaluation include AMPAC mobility, balance, ROM  and Strength.   Pt demonstrates a stable clinical presentation.      Complexity Level Hx and Co-  morbidites Examination Clinical Decision Making Clinical Presentation   Low no impact 1-2 elements Limited options Stable        Plan:  D/C inpatient PT    PMP Activity: Step 6 - Walks in Room  Distance Walked (ft) (Step 6,7): 90 Feet      AM-PAC:yes        PT Basic Mobility Raw Score: 24  CMS 0-100% Score: 0.00%               Goals:  N/A        DME Recommended for Discharge: No additional equipment/DME recommended at this time  Discharge Recommendation: Home with no needs    Khaliel Morey Colbert Ewing, PT [phone]  08/03/2019 4:45 PM

## 2019-08-03 NOTE — Consults (Addendum)
[facility] C[facility] CONSULTATION REPORT  [facility]    Date Time: 08/03/19 1:31 PM  Patient Name: Kent Jordan  Requesting Physician: Jolyn Lent, MD     Assessment:    Patient admitted 07/03/2019 with shortness of breath.  1 month in duration he becomes short of breath with talking minimal activity normal EKG no infiltrate on chest x-ray negative BNP and troponin   HTN   HLD   DM, A1c 6.9    OSA    History of tobacco use   History hemochromatosis    Recommendations:    Repeat echocardiogram   Consider pulmonary consult   Patient can have cardiac MRI given back stimulator although may be useful to evaluate for cardiac hemochromatosis   Consider cardiac CT angiogram if no other cause of shortness of breath is identified given multiple cardiac risk factors but prior normal stress echo and nuclear study.    Reason for Consultation:   Shortness of breath    History:   Kent Jordan is a 68 y.o. male admitted on 08/03/2019. We have been asked by Jolyn Lent, MD,  to provide cardiac consultation, regarding shortness of breath.  He describes a month of shortness of breath with minimal activity it began as a headache and cough he had a negative Covid test on several occasions he was treated for sinus infection with no relief he does have a history of heavy tobacco use but quit 8 years ago. He has hemochromatosis followed by Dr. Rikki Spearing with regular phlebotomy. He has a back stimulator for back pain.    Kent Jordan is well known to [facility] having been most recently evaluated by Dr. Morrie Sheldon during a telehealth visit on 12/02/2018. He is followed for a cardiac history to include HTN, HLD and prior ventricular ectopy. He also has diagnoses of diabetes mellitus, obstructive sleep apnea, elevated LFTs with fatty liver and hemochromatosis.       Kent Jordan had a normal echocardiogram with LVEF of 60% 10/17/2015. A nuclear stress test on 10/18/2015 also was normal without  evidence of myocardial ischemia.         Past Medical History:     Past Medical History:   Diagnosis Date    Arrhythmia     when on ARB    Arthritis     psoriatic and osteoarthritis     Back pain     Blood disorder 11/2014    Hemochromatosis    Diabetes mellitus 2016    started metformin 05/2016 d/t A1C 7.1 - now checking BS 2x/day - average 70-110 in the last week - A1C on 06/29/16 = 6.3 , has also lost 16 lbs since 05/2016    Difficulty in walking(719.7)     Uses cane to ambulate    Elevated liver function tests     had abd u/s 07/05/16     Failed back syndrome     Fatty liver     Hemochromatosis     phlebotomy every 3 months - last done 02/2016    Hemochromatosis 09/2014    High cholesterol     Hypertensive disorder 06/2016    norvasc recently increased to 10 mg daily - now controlled on meds - most recent BP 117/73    Low back pain     Lung nodule 2012    benign - was monitored x  3 yrs by Dr. Bernita Raisin (Pulm) - signed off from pulmonology - no further f/u required since 2015  Neuropathy     Tingling left foot, numbness left toes    Psoriasis     Psoriatic arthritis     tx w/ enbrel    S/P insertion of spinal cord stimulator 2015    Sleep apnea     mild - does not have CPAP - was unable to tolerate, less snoring since turboplasty    Type 2 diabetes mellitus, controlled        Past Surgical History:     Past Surgical History:   Procedure Laterality Date    ABDOMINAL SURGERY  03/1971    double hernia repair    ARTHROPLASTY, KNEE, TOTAL Right 08/20/2016    Procedure: ARTHROPLASTY, KNEE, TOTAL;  Surgeon: Harriet Masson, MD;  [facility];  Service: Orthopedics;  Laterality: Right;  RIGHT TOTAL KNEE REPLACEMENT    ARTHROSCOPY, KNEE Right 03/21/2016    Procedure: ARTHROSCOPY, KNEE, RIGHT, PARTIAL MEDIAL MENISCECTOMY, PARTIAL SYNOVECTOMY;  Surgeon: Verner Chol, MD;  Location: [facility];  Service: Orthopedics;  Laterality: Right;    CATARACT EXT.WITH IOL Bilateral 2009    FRACTURE  SURGERY  1993    right middle finger    INSERTION, SPINAL CORD STIMULATOR GENERATOR N/A 05/07/2014    Procedure: DORSAL COLUMN STIMULATOR PLACEMENT;  Surgeon: Tera Helper, MD;  Location: [facility];  Service: Neurosurgery;  Laterality: N/A;  DCS PLACEMENT    KNEE ARTHROCENTESIS  1983    right    LUMBAR FUSION  10/2011    MICRODISCECTOMY LUMBAR  06/2011, 07/2011    SEPTOPLASTY  10/05/2015    SKIN BIOPSY  11/2015    Negative       Family History:     Family History   Problem Relation Age of Onset    Heart disease Mother     Heart disease Father        Social History:     Social History     Socioeconomic History    Marital status: Married     Spouse name: Not on file    Number of children: Not on file    Years of education: Not on file    Highest education level: Not on file   Occupational History    Not on file   Social Needs    Financial resource strain: Not on file    Food insecurity     Worry: Not on file     Inability: Not on file    Transportation needs     Medical: Not on file     Non-medical: Not on file   Tobacco Use    Smoking status: Former Smoker     Packs/day: 1.00     Years: 41.00     Pack years: 41.00     Quit date: 09/26/2011     Years since quitting: 7.8    Smokeless tobacco: Never Used   Substance and Sexual Activity    Alcohol use: Yes     Alcohol/week: 2.0 standard drinks     Types: 4 Cans of beer per week    Drug use: No    Sexual activity: Yes     Partners: Female     Birth control/protection: Surgical   Lifestyle    Physical activity     Days per week: Not on file     Minutes per session: Not on file    Stress: Not on file   Relationships    Social connections     Talks on phone:  Not on file     Gets together: Not on file     Attends religious service: Not on file     Active member of club or organization: Not on file     Attends meetings of clubs or organizations: Not on file     Relationship status: Not on file    Intimate partner violence     Fear of current or  ex partner: Not on file     Emotionally abused: Not on file     Physically abused: Not on file     Forced sexual activity: Not on file   Other Topics Concern    Not on file   Social History Narrative    Not on file       Allergies:     Allergies   Allergen Reactions    Angiotensin Receptor Blockers Other (See Comments)     ARB combined with Celebrex caused Arrhythmia due to high K+    Ciprofloxacin Swelling and Anaphylaxis     swelling    Ace Inhibitors Angioedema    Gabapentin Other (See Comments)     blurred vision, dizziness       Medications:   (Not in a hospital admission)       Current Facility-Administered Medications   Medication Dose Route Frequency         Review of Systems:    Comprehensive review of systems including constitutional, eyes, ears, nose, mouth, throat, cardiovascular, GI, GU, musculoskeletal, integumentary, respiratory, neurologic, psychiatric, and endocrine is negative other than what is mentioned already in the history of present illness    Physical Exam:     VITAL SIGNS PHYSICAL EXAM   Vitals:    08/03/19 1223   BP: 147/70   Pulse: 73   Resp: 14   Temp:    SpO2: 97%     Temp (24hrs), Avg:98.5 F (36.9 C), Min:98.5 F (36.9 C), Max:98.5 F (36.9 C)      Intake and Output Summary (Last 24 hours) at Date Time  No intake or output data in the 24 hours ending 08/03/19 1331    Telemetry: no changes Physical Exam  General: awake, alert, breathing comfortably, no acute distress  Head: normocephalic  Eyes: EOM's intact  Cardiovascular: regular rate and rhythm, normal S1, S2, no S3, no S4, no murmurs, rubs or gallops  Neck: no carotid bruits or JVD  Lungs: clear to auscultation bilateraly, without wheezing, rhonchi, or rales  Abdomen: soft, non-tender, non-distended; no palpable masses,  normoactive bowel sounds  Extremities: no edema  Pulse: equal pulses, 4/4 symmetric  Neurological: Alert and oriented X3, mood and affect normal  Musculoskeletal: normal strength and tone     Labs  Reviewed:     Recent Labs   Lab 08/03/19  1120 07/27/19  1956   Troponin I 0.02 0.03             Recent Labs   Lab 08/03/19  1120   Bilirubin, Total 0.7   Protein, Total 7.0   Albumin 4.0   ALT 115*   AST (SGOT) 79*         Recent Labs   Lab 08/03/19  1221   PT 12.6   PT INR 1.0   PTT 22*     Recent Labs   Lab 08/03/19  1120 07/27/19  1956   WBC 8.23 11.28*   Hgb 12.2* 13.3   Hematocrit 35.0* 38.7   Platelets 294 283  Recent Labs   Lab 08/03/19  1120 07/27/19  1956   Sodium 137 139   Potassium 3.8 4.9   Chloride 104 103   CO2 20* 24   BUN 11.0 41.0*   Creatinine 0.9 1.0   EGFR >60.0 >60.0   Glucose 167* 300*   Calcium 9.4 9.4       DIAGNOSTIC    EKG: Normal Sinus Rhythm    Chest X-ray: No acute pulmonary or pleural disease.            Signed by: Jenel Lucks, NP    [facility]  NP Spectral[employer] [phone] (8am-5pm)  MD [employer] [phone] (8am-5pm)  After hours, non urgent consult line [phone]  After Hours, urgent consults or to reach the on-cal MD [phone]

## 2019-08-03 NOTE — Plan of Care (Addendum)
Problem: Safety  Goal: Patient will be free from injury during hospitalization  Outcome: Progressing  Flowsheets (Taken 08/03/2019 1849)  Patient will be free from injury during hospitalization:   Assess patient's risk for falls and implement fall prevention plan of care per policy   Use appropriate transfer methods   Include patient/ family/ care giver in decisions related to safety   Assess for patients risk for elopement and implement Elopement Risk Plan per policy   Provide alternative method of communication if needed (communication boards, writing)   Ensure appropriate safety devices are available at the bedside   Hourly rounding   Provide and maintain safe environment  Goal: Patient will be free from infection during hospitalization  Outcome: Progressing  Flowsheets (Taken 08/03/2019 1849)  Free from Infection during hospitalization:   Assess and monitor for signs and symptoms of infection   Monitor all insertion sites (i.e. indwelling lines, tubes, urinary catheters, and drains)   Encourage patient and family to use good hand hygiene technique   Monitor lab/diagnostic results     Problem: Pain  Goal: Pain at adequate level as identified by patient  Outcome: Progressing  Flowsheets (Taken 08/03/2019 1849)  Pain at adequate level as identified by patient:   Identify patient comfort function goal   Assess for risk of opioid induced respiratory depression, including snoring/sleep apnea. Alert healthcare team of risk factors identified.   Assess pain on admission, during daily assessment and/or before any "as needed" intervention(s)   Reassess pain within 30-60 minutes of any procedure/intervention, per Pain Assessment, Intervention, Reassessment (AIR) Cycle   Offer non-pharmacological pain management interventions   Evaluate if patient comfort function goal is met   Consult/collaborate with Physical Therapy, Occupational Therapy, and/or Speech Therapy   Include patient/patient care companion  in decisions related to pain management as needed   Evaluate patient's satisfaction with pain management progress   Consult/collaborate with Pain Service     Problem: Discharge Barriers  Goal: Patient will be discharged home or other facility with appropriate resources  Outcome: Progressing  Flowsheets (Taken 08/03/2019 1849)  Discharge to home or other facility with appropriate resources:   Provide appropriate patient education   Provide information on available health resources   Initiate discharge planning     Problem: Psychosocial and Spiritual Needs  Goal: Demonstrates ability to cope with hospitalization/illness  Outcome: Progressing  Flowsheets (Taken 08/03/2019 1849)  Demonstrates ability to cope with hospitalizations/illness:   Encourage verbalization of feelings/concerns/expectations   Assist patient to identify own strengths and abilities   Encourage participation in diversional activity   Include patient/ patient care companion in decisions   Communicate referral to spiritual care as appropriate   Reinforce positive adaptation of new coping behaviors   Encourage patient to set small goals for self   Provide quiet environment     Problem: Diabetes: Glucose Imbalance  Goal: Blood glucose stable at established goal  Outcome: Progressing  Flowsheets (Taken 08/03/2019 1849)  Blood glucose stable at established goal:   Monitor lab values   Monitor intake and output.  Notify LIP if urine output is < 30 mL/hour.   Follow fluid restrictions/IV/PO parameters   Include patient/family in decisions related to nutrition/dietary selections   Monitor/assess vital signs   Assess for hypoglycemia /hyperglycemia   Coordinate medication administration with meals, as indicated   Ensure adequate hydration   Ensure patient/family has adequate teaching materials   Ensure appropriate consults are obtained (Nutrition, Diabetes Education, and Case Management)   Ensure appropriate  diet and assess  tolerance  Note: Pt was admitted to 2103, AAO X 4, oriented to the room, call bell and side table is with in reach, pt ambulates in the room independently with cane, pt educated to prevent falls and to call when assistance needed, vitals are WNL, afebrile, pt's  oxygen saturation is 99% room air,  monitoring vitals, labs and blood sugar, skin is intact, anticoagulant given and education provided, pt educated and encouraged to use incentive spirometer, plan of care explained, COVID rule out isolation maintained, rounding hourly.

## 2019-08-04 LAB — T4, FREE: T4 Free: 1.1 ng/dL (ref 0.70–1.48)

## 2019-08-04 LAB — GLUCOSE WHOLE BLOOD - POCT
Whole Blood Glucose POCT: 211 mg/dL — ABNORMAL HIGH (ref 70–100)
Whole Blood Glucose POCT: 228 mg/dL — ABNORMAL HIGH (ref 70–100)
Whole Blood Glucose POCT: 185 mg/dL — ABNORMAL HIGH (ref 70–100)
Whole Blood Glucose POCT: 269 mg/dL — ABNORMAL HIGH (ref 70–100)

## 2019-08-04 LAB — LIPID PANEL
Cholesterol / HDL Ratio: 4.8
Cholesterol: 158 mg/dL (ref 0–199)
HDL: 33 mg/dL — ABNORMAL LOW (ref 40–9999)
LDL Calculated: 83 mg/dL (ref 0–99)
Triglycerides: 210 mg/dL — ABNORMAL HIGH (ref 34–149)
VLDL Calculated: 42 mg/dL — ABNORMAL HIGH (ref 10–40)

## 2019-08-04 LAB — CBC AND DIFFERENTIAL
Absolute NRBC: 0 10*3/uL (ref 0.00–0.00)
Basophils Absolute Automated: 0.08 10*3/uL (ref 0.00–0.08)
Basophils Automated: 1.3 %
Eosinophils Absolute Automated: 0.31 10*3/uL (ref 0.00–0.44)
Eosinophils Automated: 5 %
Hematocrit: 34 % — ABNORMAL LOW (ref 37.6–49.6)
Hgb: 11.3 g/dL — ABNORMAL LOW (ref 12.5–17.1)
Immature Granulocytes Absolute: 0.05 10*3/uL (ref 0.00–0.07)
Immature Granulocytes: 0.8 %
Lymphocytes Absolute Automated: 2.69 10*3/uL (ref 0.42–3.22)
Lymphocytes Automated: 43.1 %
MCH: 32.7 pg (ref 25.1–33.5)
MCHC: 33.2 g/dL (ref 31.5–35.8)
MCV: 98.3 fL — ABNORMAL HIGH (ref 78.0–96.0)
MPV: 9.5 fL (ref 8.9–12.5)
Monocytes Absolute Automated: 0.69 10*3/uL (ref 0.21–0.85)
Monocytes: 11.1 %
Neutrophils Absolute: 2.42 10*3/uL (ref 1.10–6.33)
Neutrophils: 38.7 %
Nucleated RBC: 0 /100 WBC (ref 0.0–0.0)
Platelets: 303 10*3/uL (ref 142–346)
RBC: 3.46 10*6/uL — ABNORMAL LOW (ref 4.20–5.90)
RDW: 13 % (ref 11–15)
WBC: 6.24 10*3/uL (ref 3.10–9.50)

## 2019-08-04 LAB — BASIC METABOLIC PANEL
Anion Gap: 14 (ref 5.0–15.0)
BUN: 12 mg/dL (ref 9.0–28.0)
CO2: 21 mEq/L — ABNORMAL LOW (ref 22–29)
Calcium: 9 mg/dL (ref 8.5–10.5)
Chloride: 107 mEq/L (ref 100–111)
Creatinine: 0.9 mg/dL (ref 0.7–1.3)
Glucose: 226 mg/dL — ABNORMAL HIGH (ref 70–100)
Potassium: 4.7 mEq/L (ref 3.5–5.1)
Sodium: 142 mEq/L (ref 136–145)

## 2019-08-04 LAB — MAGNESIUM: Magnesium: 2 mg/dL (ref 1.6–2.6)

## 2019-08-04 LAB — T3, FREE: T3, Free: 3.39 pg/mL (ref 1.71–3.71)

## 2019-08-04 LAB — GFR: EGFR: >60

## 2019-08-04 LAB — HEMOLYSIS INDEX
Hemolysis Index: 4 (ref 0–18)
Hemolysis Index: 5 (ref 0–18)

## 2019-08-04 LAB — TSH: TSH: 2.09 u[IU]/mL (ref 0.35–4.94)

## 2019-08-04 MED ORDER — METOPROLOL SUCCINATE ER 50 MG PO TB24
50.00 mg | ORAL_TABLET | Freq: Every day | ORAL | Status: DC
Start: 2019-08-05 — End: 2019-08-06
  Administered 2019-08-05 – 2019-08-06 (×2): 50 mg via ORAL
  Filled 2019-08-04 (×2): qty 1

## 2019-08-04 NOTE — UM Notes (Signed)
08/03/19 1259  Admit to Observation Once    Comments: Remote tele   Diagnosis: Sob (Shortness Of Breath)    Level of Care: Acute    Patient Class: Observation            UTILIZATION REVIEW CONTACT: Name: Elfredia Nevins BSN, ACM  Clinical Case Manager  - Utilization Review  [facility]  Address:  [address] [place] [address]  NPI:  [id number]  Tax ID:  [id number]  Phone: [phone]  Fax: [phone]  Email: [email or web address]        PATIENT NAME: Kent Jordan, Kent Jordan  DOB: 1951/07/25  PMH:  has a past medical history of Arrhythmia, Arthritis, Back pain, Blood disorder (11/2014), Diabetes mellitus (2016), Difficulty in walking(719.7), Elevated liver function tests, Failed back syndrome, Fatty liver, Hemochromatosis, Hemochromatosis (09/2014), High cholesterol, Hypertensive disorder (06/2016), Low back pain, Lung nodule (2012), Neuropathy, Psoriasis, Psoriatic arthritis, S/P insertion of spinal cord stimulator (2015), Sleep apnea, and Type 2 diabetes mellitus, controlled.  PSH:  has a past surgical history that includes Lumbar fusion (10/2011); Microdiscectomy lumbar (06/2011, 07/2011); Knee arthrocentesis (1983); Abdominal surgery (03/1971); CATARACT EXT.WITH IOL (Bilateral, 2009); Fracture surgery (1993); INSERTION, SPINAL CORD STIMULATOR GENERATOR (N/A, 05/07/2014); Septoplasty (10/05/2015); Skin biopsy (11/2015); ARTHROSCOPY, KNEE (Right, 03/21/2016); and ARTHROPLASTY, KNEE, TOTAL (Right, 08/20/2016).     ADMISSION REVIEW   History of present illness: Pt is a 68 y.o. male arrived at [facility] on 08/03/2019 at 0927.and admitted to med surg floor     Arrival VS: Vitals BP: 145/66, Temp: 98.5 F (36.9 C), Temp Source: Oral, Heart Rate: 68, Resp Rate: (!) 24, SpO2: 100 %, Height: 180.3 cm (5\' 11" ), Weight: 93 kg (205 lb)      On Room Air     Hgb 12.2Low     Hematocrit 35.0Low     RBC 3.71Low     MCV 94.3      Glucose 167High     CO2 20Low     AST (SGOT) 79High     ALT 115High       Arterial  Blood Gas (ABG)    pH, Arterial 7.350 - 7.450 7.465High     pCO2, Arterial 35.0 - 45.0 mmHg 30.5Low     pO2, Arterial 80.0 - 90.0 mmHg 83.6    HCO3, Arterial 23.0 - 29.0 mEq/L 21.7Low     Arterial Total CO2 24.0 - 30.0 mEq/L 43.4High     Base Excess, Arterial -2.0 - 2.0 mEq/L -0.9    O2 Sat, Arterial 95.0 - 100.0 % 96.2    ABG CollectionSite  rr    Allen's Test  y    Temperature  37.0    FIO2 % ra    Status  ra    O2 Delivery  ra            PTT 22Low       COVID pending    CXR: no acute disease    ER meds: aspirin     MD Notes: 68 y.o. male  presented with chest tightness for 1 week duration associated with SOB on exertion, with tachypnea, nothing make it better, worse with ambulation, associated with generalized fatigue, subjective fever,   He denies CP, nausea, vomiting, abdominal pain, diarrhea or constipation, focal weakness, numbness, sore throat , runny nose.     He was seen by his PCP and has been prescribed 2 rounds of AB with no improvement in symptoms.     Patient denies any change in environment,  no new allergens  Former heavy smoker, quit 8 years ago, smoked for 40 years, no know history of COPD.    He had 3 COVID-19 test in the last 2 weeks and was negative.     ASSESSMENT & PLAN    Tele monitoring   Serial cardiac enzymes   D dimer negative ruling out PE   Check echocardiogram   COVID-19 test negative X3 will check COVID-19   Contact and droplet isolation   Cardiology Dr Georgeanna Lea consulted by ER.      Essential hypertension (04/15/2014)  Chronic   Well controlled   Continue home meds      Type 2 diabetes mellitus without complication, without long-term current use of insulin (12/07/2016)  HbA1C 6.9 on 07/15/2019   Hold metformin   POCT FS with Lispro SS      Overweight (BMI 25.0-29.9) (09/11/2017)  Advise wt loss     Hemochromatosis   Stable     Alcohol abuse   3-4 beers daily   CIWA and ativan as per CIWA       Nutrition  Cardiac carb consistent diet     DVT/VTE Prophylaxis  lovenox      Anticipated medical stability for discharge: 1-2 night     Service status/Reason for ongoing hospitalization: observation/ DOE, chest tightness     Anticipated Discharge Needs: TBD     No CIWA score at this time     Scheduled Meds:  Current Facility-Administered Medications   Medication Dose Route Frequency    amLODIPine  10 mg Oral Daily    aspirin EC  81 mg Oral Daily    celecoxib  200 mg Oral BID    DULoxetine  60 mg Oral Daily    enoxaparin  40 mg Subcutaneous Daily    folic acid  400 mcg Oral Daily    hydroCHLOROthiazide  25 mg Oral Daily    insulin lispro  1-5 Units Subcutaneous TID AC    metoprolol succinate XL  25 mg Oral Daily    rosuvastatin  5 mg Oral QHS     PRN Meds:.acetaminophen **OR** acetaminophen, acetaminophen, Nursing communication: Adult Hypoglycemia Treatment Algorithm **AND** dextrose **AND** dextrose **AND** glucagon (rDNA), naloxone, ondansetron, ondansetron **OR** ondansetron, polyethylene glycol, traZODone

## 2019-08-04 NOTE — UM Notes (Signed)
11.10.2020  Concurrent obs review    08/04/19 1006 -- 98.4 F (36.9 C) Oral 80 98 % -- 18 139/69     Triglcerides 210, HDL 33, VLDL calculated 42. Glucose 226, CO2 21. H&H 11.3/34.0. RBC 3.46.     ECHO ordered    COVID not detected     SOB (shortness of breath)   Dyspnea on exertion   Chest tightness   Overweight (BMI 25.0-29.9)   Type 2 diabetes mellitus without complication, without long-term current use of insulin   Essential hypertension      Dyspnea on exertion, likely due to obstructive disease of the lungs  Hypertension  DM2  Hemochromatosis      Follow-up Covid  Echo and PFTs once Covid is negative  Consult pulmonary tomorrow  Anticipate discharge tomorrow after studies completed        Analgesia: Acetaminophen    Nutrition: Heart healthy    DVT Prophylaxis: Lovenox      Code Status: Full code    DISPO: Discharge tomorrow    Scheduled Meds:  Current Facility-Administered Medications   Medication Dose Route Frequency    albuterol  2.5 mg Nebulization On Call    amLODIPine  10 mg Oral Daily    aspirin EC  81 mg Oral Daily    celecoxib  200 mg Oral BID    DULoxetine  60 mg Oral Daily    enoxaparin  40 mg Subcutaneous Daily    folic acid  400 mcg Oral Daily    hydroCHLOROthiazide  25 mg Oral Daily    insulin lispro  1-5 Units Subcutaneous TID AC    metoprolol succinate XL  50 mg Oral Daily    rosuvastatin  5 mg Oral QHS     Continuous Infusions:  PRN Meds:.acetaminophen **OR** acetaminophen, acetaminophen, Nursing communication: Adult Hypoglycemia Treatment Algorithm **AND** dextrose **AND** dextrose **AND** glucagon (rDNA), naloxone, ondansetron, ondansetron **OR** ondansetron, polyethylene glycol, traZODone    UTILIZATION REVIEW CONTACT: Name: Elfredia Nevins, RN, BSN  Clinical Case Manager  - Utilization Review  NPI:   [id number]  Tax ID:  [id number]  Phone: [phone]  Fax: [phone]  Email: [email or web address]

## 2019-08-04 NOTE — Progress Notes (Signed)
11/10 SOB r/o COVID hx hemachromatosis card consult repeat echo consider Pulm consult cleared from PT/OT room air DCP Home watch covid result transport wife  Helaine Chess, RN, BSN, M.S.H.S., CCRN  Case Manager 1  [facility]  [phone]  Fax [phone] or [phone]

## 2019-08-04 NOTE — Progress Notes (Signed)
[facility]  PROGRESS NOTE      Patient: Kent Jordan [facility]  Date: 08/04/2019   LOS: 0 Days  Admission Date: 08/03/2019   MRN: [id number]  Attending: Anola Gurney  Please contact me on the following Spectralink [phone]       ASSESSMENT/PLAN     [name] is a 68 y.o. male admitted with Dyspnea on exertion    Interval Summary:     Active Hospital Problems    Diagnosis    SOB (shortness of breath)    Dyspnea on exertion    Chest tightness    Overweight (BMI 25.0-29.9)    Type 2 diabetes mellitus without complication, without long-term current use of insulin    Essential hypertension       Dyspnea on exertion, likely due to obstructive disease of the lungs  Hypertension  DM2  Hemochromatosis      Follow-up Covid  Echo and PFTs once Covid is negative  Consult pulmonary tomorrow  Anticipate discharge tomorrow after studies completed        Analgesia: Acetaminophen    Nutrition: Heart healthy    DVT Prophylaxis: Lovenox       Code Status: Full code    DISPO: Discharge tomorrow    Family Contact: Spouse       SUBJECTIVE     Kent Jordan states feels well, no dyspnea today.    MEDICATIONS     Current Facility-Administered Medications   Medication Dose Route Frequency    amLODIPine  10 mg Oral Daily    aspirin EC  81 mg Oral Daily    celecoxib  200 mg Oral BID    DULoxetine  60 mg Oral Daily    enoxaparin  40 mg Subcutaneous Daily    folic acid  400 mcg Oral Daily    hydroCHLOROthiazide  25 mg Oral Daily    insulin lispro  1-5 Units Subcutaneous TID AC    [START ON 08/05/2019] metoprolol succinate XL  50 mg Oral Daily    rosuvastatin  5 mg Oral QHS       PHYSICAL EXAM     Vitals:    08/04/19 1711   BP: 153/80   Pulse: 68   Resp: 16   Temp: 97.9 F (36.6 C)   SpO2: 97%       Temperature: Temp  Min: 97.3 F (36.3 C)  Max: 98.4 F (36.9 C)  Pulse: Pulse  Min: 61  Max: 80  Respiratory: Resp  Min: 16  Max: 18  Non-Invasive BP: BP  Min: 119/69  Max: 166/77  Pulse Oximetry SpO2  Min: 97 %  Max: 99  %    Intake and Output Summary (Last 24 hours) at Date Time    Intake/Output Summary (Last 24 hours) at 08/04/2019 1810  Last data filed at 08/04/2019 1722  Gross per 24 hour   Intake 705 ml   Output 500 ml   Net 205 ml         GEN APPEARANCE: Normal;  A&OX3  HEENT: PERLA; EOMI; Conjunctiva Clear  NECK: Supple; No bruits  CVS: RRR, S1, S2; No M/G/R  LUNGS: CTAB; No Wheezes; No Rhonchi: No rales  ABD: Soft; No TTP; + Normoactive BS  EXT: No edema; Pulses 2+ and intact  Skin exam: No lesions  NEURO: CN 2-12 intact; No Focal neurological deficits  CAP REFILL:  Normal  MENTAL STATUS:  Normal    Exam done by Anola Gurney, MD on  08/04/19 at 6:10 PM      LABS     Recent Labs   Lab 08/04/19  0338 08/03/19  1120   WBC 6.24 8.23   RBC 3.46* 3.71*   Hgb 11.3* 12.2*   Hematocrit 34.0* 35.0*   MCV 98.3* 94.3   Platelets 303 294       Recent Labs   Lab 08/04/19  0338 08/03/19  1120   Sodium 142 137   Potassium 4.7 3.8   Chloride 107 104   CO2 21* 20*   BUN 12.0 11.0   Creatinine 0.9 0.9   Glucose 226* 167*   Calcium 9.0 9.4   Magnesium 2.0  --        Recent Labs   Lab 08/03/19  1120   ALT 115*   AST (SGOT) 79*   Bilirubin, Total 0.7   Albumin 4.0   Alkaline Phosphatase 89       Recent Labs   Lab 08/03/19  2105 08/03/19  1623 08/03/19  1120   Troponin I <0.01 <0.01 0.02       Recent Labs   Lab 08/03/19  1221 08/03/19  1120   PT INR 1.0 See Note   PT 12.6 See Note   PTT 22* see comment       Microbiology Results     Procedure Component Value Units Date/Time    COVID-19 (SARS-COV-2) (Seltzer Standard test) [[id number]] Collected: 08/03/19 1258    Specimen: Nasopharyngeal Swab from Nasopharynx Updated: 08/03/19 1525     Purpose of COVID testing Diagnostic -PUI     SARS-CoV-2 Specimen Source Nasopharyngeal    Narrative:      o Collect and clearly label specimen type:  o PREFERRED-Upper respiratory specimen: One Nasopharyngeal  Swab in Transport Media.  o Hand deliver to laboratory ASAP           Xr Chest Ap Portable    Result Date:  08/03/2019   No acute pulmonary or pleural disease. Kent Ano, MD  08/03/2019 11:16 AM        Echo Results     None          RADIOLOGY     Upon my review:    Chest x-ray independently reviewed dated 08/03/2019: Hyperinflated lungs, cardiomegaly, no pleural effusion, no consolidation, no cephalization.    Signed,  Anola Gurney  6:10 PM 08/04/2019

## 2019-08-04 NOTE — Plan of Care (Signed)
Problem: Safety  Goal: Patient will be free from injury during hospitalization  Outcome: Progressing  Flowsheets (Taken 08/04/2019 0257)  Patient will be free from injury during hospitalization:   Assess patient's risk for falls and implement fall prevention plan of care per policy   Use appropriate transfer methods   Provide and maintain safe environment   Ensure appropriate safety devices are available at the bedside   Include patient/ family/ care giver in decisions related to safety   Hourly rounding   Assess for patients risk for elopement and implement Elopement Risk Plan per policy     Problem: Pain  Goal: Pain at adequate level as identified by patient  Outcome: Progressing  Flowsheets (Taken 08/04/2019 0257)  Pain at adequate level as identified by patient:   Identify patient comfort function goal   Assess for risk of opioid induced respiratory depression, including snoring/sleep apnea. Alert healthcare team of risk factors identified.   Assess pain on admission, during daily assessment and/or before any "as needed" intervention(s)   Reassess pain within 30-60 minutes of any procedure/intervention, per Pain Assessment, Intervention, Reassessment (AIR) Cycle   Evaluate if patient comfort function goal is met   Evaluate patient's satisfaction with pain management progress   Include patient/patient care companion in decisions related to pain management as needed     Problem: Psychosocial and Spiritual Needs  Goal: Demonstrates ability to cope with hospitalization/illness  Outcome: Progressing  Flowsheets (Taken 08/04/2019 0257)  Demonstrates ability to cope with hospitalizations/illness:   Encourage verbalization of feelings/concerns/expectations   Provide quiet environment   Assist patient to identify own strengths and abilities   Encourage participation in diversional activity   Reinforce positive adaptation of new coping behaviors  Patient is A&O*4 and is able to ambulate independently with a cane.   Patient is R/O for COVID19.  Appropriate PPE utilized.  SpO2 is greater than 96% on room air.  Patient denies pain.  Patient reports mild dyspnea when walking.  PRN Trazodone administered as sleep aid.  Call bell and side table within reach.  Will Continue to monitor.

## 2019-08-04 NOTE — Progress Notes (Signed)
Situation MCR Fx TBD SOB r/o COVID     Background Independent lives w/ spouse in 3 story home. Drives.      Assessment Home NN waiting for covid results cardiac work up transport spouse     Recommendation See above          08/04/19 1444   Healthcare Decisions   Interviewed: Patient   Orientation/Decision Making Abilities of Patient Alert and Oriented x3, able to make decisions   Advance Directive Patient has advance directive, copy in chart   (RETIRED) Healthcare Agent's Name Nayel Warncke   (RETIRED) Healthcare Agent's Phone Number [phone]   Prior to admission   Prior level of function Ambulates with assistive device   Type of Residence Private residence   Home Layout Multi-level;Bed/bath upstairs   Have running water, electricity, heat, etc? Yes   Living Arrangements Spouse/significant other   How do you get to your MD appointments? independent   How do you get your groceries? independent   Who fixes your meals? independent   Who does your laundry? independent   Who picks up your prescriptions? independent   Dressing Independent   Grooming Independent   Feeding Independent   Bathing Independent   Toileting Independent   DME Currently at [facility], [place];Dan Humphreys, [employer] [facility]/Community [facility] Home-Health Aide;Home PT/OT/Speech   Name of Agency after knee replacement   Adult Management consultant (APS) involved? No   Discharge Planning   Support Systems Spouse/significant other   Patient expects to be discharged to: home   Anticipated [place] plan discussed with: Same as interviewed   Mode of transportation: Private car (family member)   Does the patient have perscription coverage? Yes   Consults/Providers   PT Evaluation Needed 2   OT Evalulation Needed 2   SLP Evaluation Needed 2   Correct PCP listed in Epic? Yes   Family and PCP   In case you are admitted, would like family notified? No   In case you are admitted, would like your PCP notified? No   PCP on file was verified as the current PCP?  Yes   Important Message from Medicare Notice   Patient received 1st IMM Letter? n/a  (OBS)   Helaine Chess, RN, BSN, M.S.H.S., CCRN  Case Manager 1  [facility]  [phone]  Fax [phone] or [phone]

## 2019-08-04 NOTE — Plan of Care (Addendum)
Problem: Safety  Goal: Patient will be free from injury during hospitalization  Outcome: Progressing  Flowsheets (Taken 08/04/2019 0257 by Willa Rough, RN)  Patient will be free from injury during hospitalization:   Assess patient's risk for falls and implement fall prevention plan of care per policy   Use appropriate transfer methods   Provide and maintain safe environment   Ensure appropriate safety devices are available at the bedside   Include patient/ family/ care giver in decisions related to safety   Hourly rounding   Assess for patients risk for elopement and implement Elopement Risk Plan per policy  Goal: Patient will be free from infection during hospitalization  Outcome: Progressing  Flowsheets (Taken 08/03/2019 1849)  Free from Infection during hospitalization:   Assess and monitor for signs and symptoms of infection   Monitor all insertion sites (i.e. indwelling lines, tubes, urinary catheters, and drains)   Encourage patient and family to use good hand hygiene technique   Monitor lab/diagnostic results     Problem: Pain  Goal: Pain at adequate level as identified by patient  Outcome: Progressing  Flowsheets (Taken 08/04/2019 0257 by Willa Rough, RN)  Pain at adequate level as identified by patient:   Identify patient comfort function goal   Assess for risk of opioid induced respiratory depression, including snoring/sleep apnea. Alert healthcare team of risk factors identified.   Assess pain on admission, during daily assessment and/or before any "as needed" intervention(s)   Reassess pain within 30-60 minutes of any procedure/intervention, per Pain Assessment, Intervention, Reassessment (AIR) Cycle   Evaluate if patient comfort function goal is met   Evaluate patient's satisfaction with pain management progress   Include patient/patient care companion in decisions related to pain management as needed     Problem: Discharge Barriers  Goal: Patient will be discharged home  or other facility with appropriate resources  Outcome: Progressing  Flowsheets (Taken 08/03/2019 1849)  Discharge to home or other facility with appropriate resources:   Provide appropriate patient education   Provide information on available health resources   Initiate discharge planning     Problem: Psychosocial and Spiritual Needs  Goal: Demonstrates ability to cope with hospitalization/illness  Outcome: Progressing  Flowsheets (Taken 08/04/2019 0257 by Willa Rough, RN)  Demonstrates ability to cope with hospitalizations/illness:   Encourage verbalization of feelings/concerns/expectations   Provide quiet environment   Assist patient to identify own strengths and abilities   Encourage participation in diversional activity   Reinforce positive adaptation of new coping behaviors     Problem: Diabetes: Glucose Imbalance  Goal: Blood glucose stable at established goal  Outcome: Progressing  Flowsheets (Taken 08/03/2019 1849)  Blood glucose stable at established goal:   Monitor lab values   Monitor intake and output.  Notify LIP if urine output is < 30 mL/hour.   Follow fluid restrictions/IV/PO parameters   Include patient/family in decisions related to nutrition/dietary selections   Monitor/assess vital signs   Assess for hypoglycemia /hyperglycemia   Coordinate medication administration with meals, as indicated   Ensure adequate hydration   Ensure patient/family has adequate teaching materials   Ensure appropriate consults are obtained (Nutrition, Diabetes Education, and Case Management)   Ensure appropriate diet and assess tolerance  Note:    2103, AAO X 4,  call bell and side table is with in reach, pt ambulates in the room independently with cane, pt educated to prevent falls and to call when assistance needed, vitals are WNL, afebrile, pt's  oxygen  saturation is 98% room air,  monitoring vitals, labs and blood sugar,  anticoagulant given and education provided, pt educated and encouraged to  use incentive spirometer, plan of care explained, Pt is going to have Echocardiogram today, COVID rule out isolation maintained, rounding hourly .

## 2019-08-04 NOTE — Progress Notes (Signed)
[facility] PROGRESS NOTE  [facility]     Date Time: 08/04/19 5:17 PM  Patient Name: Kent Jordan, Kent Jordan  Date of Birth: Nov 01, 1950  CSN: [id number]  MRN: [id number]       Patient Active Problem List   Diagnosis    Mixed hyperlipidemia    Essential hypertension    Bigeminal rhythm    Arrhythmia    Primary osteoarthritis of right knee    Status post right knee replacement    Type 2 diabetes mellitus without complication, without long-term current use of insulin    Other hemochromatosis    Acute bilateral low back pain with sciatica, sciatica laterality unspecified    Anterior knee pain, right    Overweight (BMI 25.0-29.9)    Elevated LFTs    Psoriatic arthritis    SOB (shortness of breath)    Dyspnea on exertion    Chest tightness       Subjective:   Denies chest pain, SOB or palpitations.  Better than on admission, has walked around the room without difficulty.    Hospital Medications:      Scheduled Meds: PRN Meds:    amLODIPine, 10 mg, Oral, Daily  aspirin EC, 81 mg, Oral, Daily  celecoxib, 200 mg, Oral, BID  DULoxetine, 60 mg, Oral, Daily  enoxaparin, 40 mg, Subcutaneous, Daily  folic acid, 400 mcg, Oral, Daily  hydroCHLOROthiazide, 25 mg, Oral, Daily  insulin lispro, 1-5 Units, Subcutaneous, TID AC  metoprolol succinate XL, 25 mg, Oral, Daily  rosuvastatin, 5 mg, Oral, QHS        Continuous Infusions:   acetaminophen, 650 mg, Q6H PRN    Or  acetaminophen, 650 mg, Q6H PRN  acetaminophen, 650 mg, Once PRN  dextrose, 15 g of glucose, PRN    And  dextrose, 12.5 g, PRN    And  glucagon (rDNA), 1 mg, PRN  naloxone, 0.2 mg, PRN  ondansetron, 4 mg, Once PRN  ondansetron, 4 mg, Q6H PRN    Or  ondansetron, 4 mg, Q6H PRN  polyethylene glycol, 17 g, Daily PRN  traZODone, 50 mg, QHS PRN              Home Medications:     Medications Prior to Admission   Medication Sig Dispense Refill Last Dose    amLODIPine (NORVASC) 10 MG tablet Take 10 mg by mouth daily          aspirin 81 MG tablet Take 81 mg by  mouth daily.       celecoxib (CeleBREX) 200 MG capsule Take 200 mg by mouth 2 (two) times daily       DULoxetine (CYMBALTA) 60 MG capsule Take 60 mg by mouth daily.       folic acid (FOLVITE) 400 MCG tablet Take 400 mcg by mouth daily.       hydroCHLOROthiazide (HYDRODIURIL) 25 MG tablet Take 25 mg by mouth daily.          metFORMIN (GLUCOPHAGE-XR) 500 MG 24 hr tablet TAKE 1 TABLET (500 MG TOTAL) BY MOUTH EVERY MORNING WITH BREAKFAST 90 tablet 0     metoprolol succinate XL (TOPROL-XL) 25 MG 24 hr tablet Take 25 mg by mouth daily     3     MILK THISTLE PO Take by mouth       Omega-3 Fatty Acids (Omega-3 Fish Oil) 500 MG Cap Take 1,400 mg by mouth daily          rosuvastatin (CRESTOR) 5 MG tablet Take 1  tablet (5 mg total) by mouth daily 90 tablet 1     Secukinumab, 300 MG Dose, (Cosentyx, 300 MG Dose,) 150 MG/ML Solution Prefilled Syringe Inject into the skin       traZODone (DESYREL) 50 MG tablet Take 50 mg by mouth nightly as needed.           [EXPIRED] azithromycin (ZITHROMAX) 250 MG tablet Take 2 tablets (500 mg) on  Day 1,  followed by 1 tablet (250 mg) once daily on Days 2 through 5. 6 tablet 0     ondansetron (ZOFRAN-ODT) 4 MG disintegrating tablet Take 1 tablet (4 mg total) by mouth every 6 (six) hours as needed for Nausea 8 tablet 0          Physical Exam:     VITAL SIGNS   Temp:  [97.3 F (36.3 C)-98.4 F (36.9 C)] 97.9 F (36.6 C)  Heart Rate:  [61-80] 68  Resp Rate:  [16-18] 16  BP: (119-166)/(69-80) 153/80  POCT Glucose Result (Read Only)  Avg: 206.7  Min: 181  Max: 228  SpO2: 97 %    Intake/Output Summary (Last 24 hours) at 08/04/2019 1717  Last data filed at 08/03/2019 1855  Gross per 24 hour   Intake 305 ml   Output 500 ml   Net -195 ml          GENERAL: Patient is in no acute distress   HEENT: No scleral icterus or conjunctival pallor, moist mucous membranes   NECK: No jugular venous distention, normal carotid upstrokes without bruits   CARDIAC: Regular rate and rhythm, with normal S1  and S2, and no murmurs, rubs, or gallops   CHEST: Clear to auscultation bilaterally, normal respiratory effort  ABDOMEN: Nontender, non-distended  EXTREMITIES: No edema  PULSES: Full and equal bilaterally.  SKIN: No rash or jaundice   NEUROLOGIC: Alert and oriented to time, place and person, normal mood and affect   MUSCULOSKELETAL: Normal muscle strength and tone.      Labs:     CBC w/Diff   Recent Labs   Lab 08/04/19  0338 08/03/19  1120   WBC 6.24 8.23   Hgb 11.3* 12.2*   Hematocrit 34.0* 35.0*   Platelets 303 294          Comprehensive Metabolic Profile   Recent Labs   Lab 08/04/19  0338 08/03/19  1120   Sodium 142 137   Potassium 4.7 3.8   Chloride 107 104   CO2 21* 20*   BUN 12.0 11.0   Creatinine 0.9 0.9   Calcium 9.0 9.4   Albumin  --  4.0   Protein, Total  --  7.0   Bilirubin, Total  --  0.7   Alkaline Phosphatase  --  89   ALT  --  115*   AST (SGOT)  --  79*   Glucose 226* 167*   Magnesium 2.0  --           Cardiac Enzymes   Recent Labs   Lab 08/03/19  2105 08/03/19  1623 08/03/19  1120   Troponin I <0.01 <0.01 0.02       Lab Results   Component Value Date    BNP 77 08/03/2019    BNP 17 10/05/2015          Thyroid Studies    Recent Labs   Lab 08/04/19  0338   TSH 2.09   T3, Free 3.39  Lipid Profile   Recent Labs   Lab 08/04/19  0338   Cholesterol 158          Coagulation Studies   Recent Labs   Lab 08/03/19  1221 08/03/19  1120   PT 12.6 See Note   PT INR 1.0 See Note   PTT 22* see comment     Lab Results   Component Value Date    DDIMER 0.43 08/03/2019            Telemetry:   Sinus tachycardia with PVCs and PACs    Rads:   Ct Abd/pelvis Without Contrast    Result Date: 07/27/2019  CT ABDOMEN PELVIS WO IV/ WO PO CONT CLINICAL HISTORY: pain. Type or side not specified. COMPARISON: None available. TECHNIQUE: Helical CT scan through the abdomen was obtained from the domes of the diaphragm to the pubic symphysis without intravenous or oral contrast, as it was tailored for evaluation of urinary tract  stones. Note that CT scanning at this site  utilizes multiple dose reduction techniques including automatic exposure control, adjustment of the MAS and/or KVP according to patient's size and use of iterative reconstruction technique FINDINGS: There is no hydronephrosis. No stone is identified in either kidney or the ureters. Absence of contrast limits the evaluation of the solid organs and intraperitoneal contents. The unenhanced liver, spleen, pancreas, gallbladder, and adrenal glands are within normal limits for age. There is no bowel obstruction or abnormal bowel dilatation. The appendix is normal. Postsurgical changes of the lumbar spine noted post metallic rod and pedicle screw fixation between L3 and S1. Disc spacers noted at L4/L5 and L5/S1. Advanced degenerative changes of the abdominal 3/L4 disc with vacuum phenomena noted. No evidence of acute fracture. The urinary bladder is within normal limits.. The  prostate and seminal vesicles are within normal limits.      1. No renal stone or hydronephrosis. 2. No evidence of acute intraperitoneal process in this unenhanced study. 3. Post surgical changes of the lumbar spine and multilevel degenerative changes Heron Nay, MD  07/27/2019 7:51 PM    Ct Head Without Contrast    Result Date: 07/27/2019  Clinical History: Headache. Examination: CT HEAD WO CONTRAST TECHNIQUE: 5 mm helical images obtained from the skull base through the vertex without contrast. 3 mm sagittal and coronal reformatted images provided.  CT images were acquired using Automated Exposure Control for dose reduction. COMPARISON: 05/19/2017 FINDINGS: There is no evidence of acute intracranial hemorrhage, extra-axial collection, mass effect, midline shift, herniation or hydrocephalus. The ventricles, sulci and cisterns are age appropriate.  The gray-white differentiation is intact.   The visualized paranasal sinuses and mastoid air cells are clear.   The surrounding soft tissues and osseous  structures are unremarkable.     No acute abnormality detected. Carleene Overlie, MD  07/27/2019 7:40 PM    Xr Chest Ap Portable    Result Date: 08/03/2019  XR CHEST AP PORTABLE CLINICAL INDICATION:   sob, r/o pna COMPARISON: 07/27/2019 FINDINGS: The cardiomediastinal silhouette appears within normal size limits for portable technique and patient positioning. There is no evidence for focal airspace consolidation, pleural effusion, or pneumothorax. The pulmonary vascularity appears unremarkable.      No acute pulmonary or pleural disease. [name], MD  08/03/2019 11:16 AM    Chest Ap Portable    Result Date: 07/27/2019  XR CHEST AP PORTABLE CLINICAL INDICATION: chest pain COMPARISON: 10/05/2015 FINDINGS:  A portable AP view  of the chest was obtained. The lungs  are clear. The heart and vascularity are within normal limits. The lateral costophrenic angles are sharp.      No acute process. Heron Nay, MD  07/27/2019 7:16 PM      Assessment:    Patient admitted 07/03/2019 with shortness of breath.  1 month in duration he becomes short of breath with talking minimal activity normal EKG no infiltrate on chest x-ray negative BNP and troponin   HTN   HLD   DM, A1c 6.9    OSA    History of tobacco use   History hemochromatosis    Plan:     68 year old man with past medical history of hyperlipidemia, hyper tension, diabetes, OSA, tobacco use and hemochromatosis coming in with shortness of breath.  Cardiology is following for potential cardiac etiologies.  Patient has a normal proBNP, clear x-ray, lack of lower extremity edema or jugular venous distention.  Patient has an echocardiogram that is ordered, however not completed today because his Covid test had not come back negative yet.  I anticipate that it is likely to be normal given his lack of ancillary findings of heart failure.  Given his history of smoking, would be more concerned for COPD, with a high resolution CT scan of the chest and PFTs.  History of  hemochromatosis but his hemoglobin is only 12.  Would wait for his echo, no other real reason to keep him in the hospital.  If he continues to have exertional symptoms could consider coronary CTA given his stress test in the past have been negative.    -Signs and symptoms do not point to heart failure as a cause of his symptoms  -Patient remains hypertensive we will increase metoprolol from 25 to 50 mg.  -Awaiting echo, not completed today given his Covid test did not come back yet  -Would be more concerned for lung disease, consider high-resolution CT scan of the chest and PFTs  -Discussed with hospitalist.      Signed by: Ruben Reason Pacey Willadsen      [facility]  NP Spectralink [phone] (8am-5pm)  MD Spectralink [phone] or [phone]  After hours, non urgent consult line [phone]  After Hours, urgent consults [phone]

## 2019-08-05 ENCOUNTER — Observation Stay: Payer: Medicare Other

## 2019-08-05 ENCOUNTER — Inpatient Hospital Stay: Payer: Medicare Other

## 2019-08-05 DIAGNOSIS — I2782 Chronic pulmonary embolism: Secondary | ICD-10-CM | POA: Diagnosis present

## 2019-08-05 LAB — BASIC METABOLIC PANEL
Anion Gap: 12 (ref 5.0–15.0)
BUN: 12 mg/dL (ref 9.0–28.0)
CO2: 22 mEq/L (ref 22–29)
Calcium: 8.8 mg/dL (ref 8.5–10.5)
Chloride: 105 mEq/L (ref 100–111)
Creatinine: 0.8 mg/dL (ref 0.7–1.3)
Glucose: 191 mg/dL — ABNORMAL HIGH (ref 70–100)
Potassium: 4 mEq/L (ref 3.5–5.1)
Sodium: 139 mEq/L (ref 136–145)

## 2019-08-05 LAB — ECG 12-LEAD
Atrial Rate: 69 {beats}/min
P Axis: 51 degrees
P-R Interval: 160 ms
Q-T Interval: 394 ms
QRS Duration: 98 ms
QTC Calculation (Bezet): 422 ms
R Axis: 23 degrees
T Axis: 52 degrees
Ventricular Rate: 69 {beats}/min

## 2019-08-05 LAB — CBC AND DIFFERENTIAL
Absolute NRBC: 0 10*3/uL (ref 0.00–0.00)
Basophils Absolute Automated: 0.06 10*3/uL (ref 0.00–0.08)
Basophils Automated: 1.1 %
Eosinophils Absolute Automated: 0.3 10*3/uL (ref 0.00–0.44)
Eosinophils Automated: 5.4 %
Hematocrit: 32.9 % — ABNORMAL LOW (ref 37.6–49.6)
Hgb: 11.3 g/dL — ABNORMAL LOW (ref 12.5–17.1)
Immature Granulocytes Absolute: 0.02 10*3/uL (ref 0.00–0.07)
Immature Granulocytes: 0.4 %
Lymphocytes Absolute Automated: 2.39 10*3/uL (ref 0.42–3.22)
Lymphocytes Automated: 43.1 %
MCH: 32.3 pg (ref 25.1–33.5)
MCHC: 34.3 g/dL (ref 31.5–35.8)
MCV: 94 fL (ref 78.0–96.0)
MPV: 9.4 fL (ref 8.9–12.5)
Monocytes Absolute Automated: 0.72 10*3/uL (ref 0.21–0.85)
Monocytes: 13 %
Neutrophils Absolute: 2.06 10*3/uL (ref 1.10–6.33)
Neutrophils: 37 %
Nucleated RBC: 0 /100 WBC (ref 0.0–0.0)
Platelets: 305 10*3/uL (ref 142–346)
RBC: 3.5 10*6/uL — ABNORMAL LOW (ref 4.20–5.90)
RDW: 13 % (ref 11–15)
WBC: 5.55 10*3/uL (ref 3.10–9.50)

## 2019-08-05 LAB — COVID-19 (SARS-COV-2): SARS CoV 2 Overall Result: NOT DETECTED

## 2019-08-05 LAB — GFR: EGFR: >60

## 2019-08-05 LAB — HEMOLYSIS INDEX: Hemolysis Index: 6 (ref 0–18)

## 2019-08-05 LAB — GLUCOSE WHOLE BLOOD - POCT
Whole Blood Glucose POCT: 155 mg/dL — ABNORMAL HIGH (ref 70–100)
Whole Blood Glucose POCT: 187 mg/dL — ABNORMAL HIGH (ref 70–100)

## 2019-08-05 MED ORDER — FAMOTIDINE 20 MG PO TABS
20.0000 mg | ORAL_TABLET | Freq: Two times a day (BID) | ORAL | Status: DC
Start: 2019-08-06 — End: 2019-08-06
  Administered 2019-08-06: 10:00:00 20 mg via ORAL
  Filled 2019-08-05: qty 1

## 2019-08-05 MED ORDER — APIXABAN 5 MG PO TABS
10.0000 mg | ORAL_TABLET | Freq: Two times a day (BID) | ORAL | Status: DC
Start: 2019-08-05 — End: 2019-08-06
  Administered 2019-08-05 – 2019-08-06 (×2): 10 mg via ORAL
  Filled 2019-08-05 (×3): qty 2

## 2019-08-05 MED ORDER — ALBUTEROL SULFATE (2.5 MG/3ML) 0.083% IN NEBU
2.50 mg | INHALATION_SOLUTION | RESPIRATORY_TRACT | Status: AC
Start: 2019-08-05 — End: 2019-08-05
  Administered 2019-08-05: 17:00:00 2.5 mg via RESPIRATORY_TRACT

## 2019-08-05 MED ORDER — INSULIN GLARGINE 100 UNIT/ML SC SOLN
5.00 [IU] | Freq: Every day | SUBCUTANEOUS | Status: DC
Start: 2019-08-05 — End: 2019-08-06
  Administered 2019-08-05 – 2019-08-06 (×2): 5 [IU] via SUBCUTANEOUS
  Filled 2019-08-05 (×2): qty 5

## 2019-08-05 MED ORDER — IOHEXOL 350 MG/ML IV SOLN
100.00 mL | Freq: Once | INTRAVENOUS | Status: AC | PRN
Start: 2019-08-05 — End: 2019-08-05
  Administered 2019-08-05: 100 mL via INTRAVENOUS

## 2019-08-05 MED ORDER — ALBUTEROL SULFATE (2.5 MG/3ML) 0.083% IN NEBU
2.5000 mg | INHALATION_SOLUTION | RESPIRATORY_TRACT | Status: AC
Start: 2019-08-05 — End: 2019-08-05

## 2019-08-05 MED ORDER — APIXABAN 5 MG PO TABS
5.0000 mg | ORAL_TABLET | Freq: Two times a day (BID) | ORAL | Status: DC
Start: 2019-08-12 — End: 2019-08-06

## 2019-08-05 NOTE — UM Notes (Signed)
11.11.2020  Concurrent obs review:    08/05/19 1152 -- 97.9 F (36.6 C) Oral 65 97 % -- 16 143/78     On Room Air    Pulses:  2+ pedal, radial, brachial pulses equal bilateraly    PTT 22. H&H 11.3/32.9, glucose 191.     Assessment:   Patient admitted 08/03/2019 with shortness of breath  ? Negative BNP and troponin  ? No infiltrate on CXR  ? Normal ECG   Normal echocardiogram with LVEF 60% 10/17/2015   Normal nuclear ST without evidence of myocardial ischemia 10/18/2015   HTN   HLD, on statin   DM, A1c 6.9   OSA   Elevated LFTs   Prior tobacco use (40-pack-year history)   History of hemochromatosis   SARS CoV 2 Overall Result - negative 08/03/19      Recommendations:     Further cardiac recommendations following completion of echocardiogram today.   PFTs also planned.    Scheduled Meds:  Current Facility-Administered Medications   Medication Dose Route Frequency    albuterol  2.5 mg Nebulization On Call    amLODIPine  10 mg Oral Daily    aspirin EC  81 mg Oral Daily    celecoxib  200 mg Oral BID    DULoxetine  60 mg Oral Daily    enoxaparin  40 mg Subcutaneous Daily    folic acid  400 mcg Oral Daily    hydroCHLOROthiazide  25 mg Oral Daily    insulin glargine  5 Units Subcutaneous Daily    insulin lispro  1-5 Units Subcutaneous TID AC    metoprolol succinate XL  50 mg Oral Daily    rosuvastatin  5 mg Oral QHS     Continuous Infusions:  PRN Meds:.acetaminophen **OR** acetaminophen, acetaminophen, Nursing communication: Adult Hypoglycemia Treatment Algorithm **AND** dextrose **AND** dextrose **AND** glucagon (rDNA), naloxone, ondansetron, ondansetron **OR** ondansetron, polyethylene glycol, traZODone    UTILIZATION REVIEW CONTACT: Name: Elfredia Nevins, RN, BSN  Clinical Case Manager  - Utilization Review  NPI:   [id number]  Tax ID:  [id number]  Phone: [phone]  Fax: [phone]  Email: [email or web address]

## 2019-08-05 NOTE — Respiratory Progress Note (Signed)
Attempted PFT, patient informed therapist of recent doctor visit. Patient stated order for Stat CT to r/o PE and PFT could be performed as outpt. Rt reported to [employer], effort made to contact Dr. Mariah Milling for clarification.

## 2019-08-05 NOTE — Consults (Addendum)
PULM / CCM CONSULTATION    [phone]    Date Time: 08/05/19 4:48 PM  Patient Name: Kent Jordan  Requesting Physician: Anola Gurney, MD      Reason for Consultation:     Shortness of breath    Assessment:     Acute hypoxic respiratory insufficiency etiology unclear at this time chest x-ray is clear.  Patient does not have any wheezing.  Rule out pulmonary embolism  History of tobacco abuse  COVID-19 ruled out  Patient might have mild COPD based on exam  Plan:   CT angiogram of the chest to rule out PE  Cardiac stress test  Complete pulmonary function test  If pulmonary embolism is ruled out okay to discharge patient home for further work-up and outpatient  As needed bronchodilators  DVT prophylaxis      History:         Kent Jordan is a 68 y.o. male who presents to the hospital on 08/03/2019 with shortness of breath for last few weeks without any wheezing fever cough or expectoration.  Patient is ruled out for COVID-19.  His exercise tolerance fluctuates day-to-day sometimes he can go for tomorrow sometimes he is short of breath and half a mile.  Chest x-ray on admission was negative for any infiltrate        Past Medical History:     Past Medical History:   Diagnosis Date    Arrhythmia     when on ARB    Arthritis     psoriatic and osteoarthritis     Back pain     Blood disorder 11/2014    Hemochromatosis    Diabetes mellitus 2016    started metformin 05/2016 d/t A1C 7.1 - now checking BS 2x/day - average 70-110 in the last week - A1C on 06/29/16 = 6.3 , has also lost 16 lbs since 05/2016    Difficulty in walking(719.7)     Uses cane to ambulate    Elevated liver function tests     had abd u/s 07/05/16     Failed back syndrome     Fatty liver     Hemochromatosis     phlebotomy every 3 months - last done 02/2016    Hemochromatosis 09/2014    High cholesterol     Hypertensive disorder 06/2016    norvasc recently increased to 10 mg daily - now controlled on meds - most recent BP 117/73    Low back  pain     Lung nodule 2012    benign - was monitored x  3 yrs by Dr. Bernita Raisin (Pul[facility]) - signed off from pulmonology - no further f/u required since 2015    Neuropathy     Tingling left foot, numbness left toes    Psoriasis     Psoriatic arthritis     tx w/ enbrel    S/P insertion of spinal cord stimulator 2015    Sleep apnea     mild - does not have CPAP - was unable to tolerate, less snoring since turboplasty    Type 2 diabetes mellitus, controlled        Past Surgical History:     Past Surgical History:   Procedure Laterality Date    ABDOMINAL SURGERY  03/1971    double hernia repair    ARTHROPLASTY, KNEE, TOTAL Right 08/20/2016    Procedure: ARTHROPLASTY, KNEE, TOTAL;  Surgeon: Harriet Masson, MD;  Location: [facility];  Service: Orthopedics;  Laterality: Right;  RIGHT TOTAL KNEE REPLACEMENT    ARTHROSCOPY, KNEE Right 03/21/2016    Procedure: ARTHROSCOPY, KNEE, RIGHT, PARTIAL MEDIAL MENISCECTOMY, PARTIAL SYNOVECTOMY;  Surgeon: Verner Chol, MD;  Location: [facility];  Service: Orthopedics;  Laterality: Right;    CATARACT EXT.WITH IOL Bilateral 2009    FRACTURE SURGERY  1993    right middle finger    INSERTION, SPINAL CORD STIMULATOR GENERATOR N/A 05/07/2014    Procedure: DORSAL COLUMN STIMULATOR PLACEMENT;  Surgeon: Tera Helper, MD;  Location: [facility];  Service: Neurosurgery;  Laterality: N/A;  DCS PLACEMENT    KNEE ARTHROCENTESIS  1983    right    LUMBAR FUSION  10/2011    MICRODISCECTOMY LUMBAR  06/2011, 07/2011    SEPTOPLASTY  10/05/2015    SKIN BIOPSY  11/2015    Negative       Family History:     Family History   Problem Relation Age of Onset    Heart disease Mother     Heart disease Father        Social History:     Social History     Socioeconomic History    Marital status: Married     Spouse name: Not on file    Number of children: Not on file    Years of education: Not on file    Highest education level: Not on file   Occupational History    Not on file   Social  Needs    Financial resource strain: Not on file    Food insecurity     Worry: Not on file     Inability: Not on file    Transportation needs     Medical: Not on file     Non-medical: Not on file   Tobacco Use    Smoking status: Former Smoker     Packs/day: 1.00     Years: 41.00     Pack years: 41.00     Quit date: 09/26/2011     Years since quitting: 7.8    Smokeless tobacco: Never Used   Substance and Sexual Activity    Alcohol use: Yes     Alcohol/week: 2.0 standard drinks     Types: 4 Cans of beer per week    Drug use: No    Sexual activity: Yes     Partners: Female     Birth control/protection: Surgical   Lifestyle    Physical activity     Days per week: Not on file     Minutes per session: Not on file    Stress: Not on file   Relationships    Social connections     Talks on phone: Not on file     Gets together: Not on file     Attends religious service: Not on file     Active member of club or organization: Not on file     Attends meetings of clubs or organizations: Not on file     Relationship status: Not on file    Intimate partner violence     Fear of current or ex partner: Not on file     Emotionally abused: Not on file     Physically abused: Not on file     Forced sexual activity: Not on file   Other Topics Concern    Not on file   Social History Narrative    Not on file       Allergies:  Allergies   Allergen Reactions    Angiotensin Receptor Blockers Other (See Comments)     ARB combined with Celebrex caused Arrhythmia due to high K+    Ciprofloxacin Swelling and Anaphylaxis     swelling    Ace Inhibitors Angioedema    Gabapentin Other (See Comments)     blurred vision, dizziness       Hospital Medications:     Current Facility-Administered Medications   Medication Dose Route Frequency    albuterol  2.5 mg Nebulization On Call    amLODIPine  10 mg Oral Daily    aspirin EC  81 mg Oral Daily    celecoxib  200 mg Oral BID    DULoxetine  60 mg Oral Daily    enoxaparin  40 mg  Subcutaneous Daily    folic acid  400 mcg Oral Daily    hydroCHLOROthiazide  25 mg Oral Daily    insulin glargine  5 Units Subcutaneous Daily    insulin lispro  1-5 Units Subcutaneous TID AC    metoprolol succinate XL  50 mg Oral Daily    rosuvastatin  5 mg Oral QHS       Home Medications:     Medications Prior to Admission   Medication Sig Dispense Refill Last Dose    amLODIPine (NORVASC) 10 MG tablet Take 10 mg by mouth daily          aspirin 81 MG tablet Take 81 mg by mouth daily.       celecoxib (CeleBREX) 200 MG capsule Take 200 mg by mouth 2 (two) times daily       DULoxetine (CYMBALTA) 60 MG capsule Take 60 mg by mouth daily.       folic acid (FOLVITE) 400 MCG tablet Take 400 mcg by mouth daily.       hydroCHLOROthiazide (HYDRODIURIL) 25 MG tablet Take 25 mg by mouth daily.          metFORMIN (GLUCOPHAGE-XR) 500 MG 24 hr tablet TAKE 1 TABLET (500 MG TOTAL) BY MOUTH EVERY MORNING WITH BREAKFAST 90 tablet 0     metoprolol succinate XL (TOPROL-XL) 25 MG 24 hr tablet Take 25 mg by mouth daily     3     MILK THISTLE PO Take by mouth       Omega-3 Fatty Acids (Omega-3 Fish Oil) 500 MG Cap Take 1,400 mg by mouth daily          rosuvastatin (CRESTOR) 5 MG tablet Take 1 tablet (5 mg total) by mouth daily 90 tablet 1     Secukinumab, 300 MG Dose, (Cosentyx, 300 MG Dose,) 150 MG/ML Solution Prefilled Syringe Inject into the skin       traZODone (DESYREL) 50 MG tablet Take 50 mg by mouth nightly as needed.           [EXPIRED] azithromycin (ZITHROMAX) 250 MG tablet Take 2 tablets (500 mg) on  Day 1,  followed by 1 tablet (250 mg) once daily on Days 2 through 5. 6 tablet 0     ondansetron (ZOFRAN-ODT) 4 MG disintegrating tablet Take 1 tablet (4 mg total) by mouth every 6 (six) hours as needed for Nausea 8 tablet 0        Code Status:      full code    Review of Systems:        General ROS:  Afebrile no weight loss weight gain   ENT ROS:  No sore throat no nasal discharge  Endocrine ROS:  No  fatigue   Respiratory ROS: Shortness of breath    cardiovascular ROS:  No chest pain or palpitation   Gastrointestinal ROS:  No nausea vomiting diarrhea.  No melanotic stool   Genito-Urinary ROS:  No burning in the urine or hematuria   Musculoskeletal ROS:  No musculoskeletal deformities   Neurological ROS:  No stroke seizure disorder   Dermatological ROS:  No skin rash      Physical Exam:     Vitals:    08/05/19 1152   BP: 143/78   Pulse: 65   Resp: 16   Temp: 97.9 F (36.6 C)   SpO2: 97%         Intake/Output Summary (Last 24 hours) at 08/05/2019 1648  Last data filed at 08/05/2019 1016  Gross per 24 hour   Intake 1120 ml   Output --   Net 1120 ml           General appearance - no visible respiratory distress patient does not appear toxic  Mental status - Alert and oriented x3  Eyes - EOMI PERRLA  Nose - no nasal discharge  Mouth - mucous membrane is moist.  No evidence of sore throat  Neck - no JVD lymphadenopathy or thyromegaly  Chest -decreased breath sound but no wheezing heard  Heart - S1-S2 RRR no S3-S4 no murmur  Abdomen - soft nontender bowel sounds are normal with no hepatosplenomegaly  Neurological - no motor or sensory deficit  Extremities - no edema clubbing or cyanosis  Skin - no skin rash  Capillary refill time less than 3 seconds  Labs:       CBC w/Diff CMP   Recent Labs   Lab 08/05/19  0426 08/04/19  0338 08/03/19  1120   WBC 5.55 6.24 8.23   Hgb 11.3* 11.3* 12.2*   Hematocrit 32.9* 34.0* 35.0*   Platelets 305 303 294   MCV 94.0 98.3* 94.3   Neutrophils 37.0 38.7 51.4       PT/INR   Recent Labs   Lab 08/03/19  1221 08/03/19  1120   PT INR 1.0 See Note       Recent Labs   Lab 08/05/19  0426 08/04/19  0338 08/03/19  1120   Sodium 139 142 137   Potassium 4.0 4.7 3.8   Chloride 105 107 104   CO2 22 21* 20*   BUN 12.0 12.0 11.0   Creatinine 0.8 0.9 0.9   Glucose 191* 226* 167*   Calcium 8.8 9.0 9.4   Magnesium  --  2.0  --    Protein, Total  --   --  7.0   Albumin  --   --  4.0   AST (SGOT)  --    --  79*   ALT  --   --  115*   Alkaline Phosphatase  --   --  89   Bilirubin, Total  --   --  0.7      Glucose POCT   Recent Labs   Lab 08/05/19  0426 08/04/19  0338 08/03/19  1120   Glucose 191* 226* 167*        Recent Labs   Lab 08/03/19  2105 08/03/19  1623 08/03/19  1120   Troponin I <0.01 <0.01 0.02             ABGs:    ABG CollectionSite   Date Value Ref Range Status   08/03/2019 rr  Final  Allen's Test   Date Value Ref Range Status   08/03/2019 y  Final     pH, Arterial   Date Value Ref Range Status   08/03/2019 7.465 (H) 7.350 - 7.450 Final     pCO2, Arterial   Date Value Ref Range Status   08/03/2019 30.5 (L) 35.0 - 45.0 mmHg Final     pO2, Arterial   Date Value Ref Range Status   08/03/2019 83.6 80.0 - 90.0 mmHg Final     HCO3, Arterial   Date Value Ref Range Status   08/03/2019 21.7 (L) 23.0 - 29.0 mEq/L Final     Base Excess, Arterial   Date Value Ref Range Status   08/03/2019 -0.9 -2.0 - 2.0 mEq/L Final     O2 Sat, Arterial   Date Value Ref Range Status   08/03/2019 96.2 95.0 - 100.0 % Final       Urinalysis        Invalid input(s): LEUKOCYTESUR      Rads:     Radiology Results (24 Hour)     ** No results found for the last 24 hours. **      .    Gean Quint MD  08/05/2019  4:48 PM

## 2019-08-05 NOTE — Progress Notes (Signed)
08/05/19 1751   Spirometry Pre and/or Post   Procedure Location and Type Diagnostic - Pre and Post   Medications Albuterol   Pre FVC 4.35 L   Pre FEV1 3.38 L   Pre FEV1/FVC % (Calculated) 78   Post FVC 4.35   Post FEV1 3.46   Post FEV1/FVC % (Calculated) 79.54   Patient follows commands Yes   Cough during exhalation No   Early termination No   Leak No   Variable effort No   2 FVC 150 ml of each other Yes   2 FEV1 150 ml of each other Yes   3 maneuvers performed Yes   Patient Effort Good   Adverse Reactions None   Lung Volumes   Method Body box   Procedure status Completed   DLCO   Procedure status Completed   Primary Charges   $ Diffusing Capacity Done? Yes   $ Plethysmography Airway Resistance Performed? Yes   $ Spirometry Type Performed Pre/Post Bronchodialator - CPT [id number]   Performing Departments   Diffusing Capacity Performing department formula [id number]   PLAB Pleth performing department formula [id number]

## 2019-08-05 NOTE — Plan of Care (Addendum)
Pt A/Ox 4, independent with ADL. Low risk for fall. Pt ambulates in the hallways. VSS. Sob with exhaustion. On RA 97%. Pt is diabetic, blood sugar monitored and insulin per sliding scale. Echo done. Awaiting for pulmonary function test.       Problem: Safety  Goal: Patient will be free from injury during hospitalization  Outcome: Progressing  Goal: Patient will be free from infection during hospitalization  Outcome: Progressing  Flowsheets (Taken 08/03/2019 1849 by Vella Redhead, RN)  Free from Infection during hospitalization:   Assess and monitor for signs and symptoms of infection   Monitor all insertion sites (i.e. indwelling lines, tubes, urinary catheters, and drains)   Encourage patient and family to use good hand hygiene technique   Monitor lab/diagnostic results     Problem: Fluid and Electrolyte Imbalance/ Endocrine  Goal: Fluid and electrolyte balance are achieved/maintained  Outcome: Progressing  Flowsheets (Taken 08/05/2019 0327 by Charmayne Sheer, Ghislaine, RN)  Fluid and electrolyte balance are achieved/maintained:   Monitor intake and output every shift   Assess for confusion/personality changes   Observe for seizure activity and initiate seizure precautions if indicated   Monitor daily weight   Monitor/assess lab values and report abnormal values   Assess and reassess fluid and electrolyte status   Observe for cardiac arrhythmias   Provide adequate hydration   Monitor for muscle weakness     Problem: Diabetes: Glucose Imbalance  Goal: Blood glucose stable at established goal  Outcome: Progressing  Flowsheets (Taken 08/03/2019 1849 by Charyl Dancer A, RN)  Blood glucose stable at established goal:   Monitor lab values   Monitor intake and output.  Notify LIP if urine output is < 30 mL/hour.   Follow fluid restrictions/IV/PO parameters   Include patient/family in decisions related to nutrition/dietary selections   Monitor/assess vital signs   Assess for hypoglycemia /hyperglycemia    Coordinate medication administration with meals, as indicated   Ensure adequate hydration   Ensure patient/family has adequate teaching materials   Ensure appropriate consults are obtained (Nutrition, Diabetes Education, and Case Management)   Ensure appropriate diet and assess tolerance

## 2019-08-05 NOTE — Plan of Care (Signed)
Problem: Safety  Goal: Patient will be free from infection during hospitalization  Outcome: Progressing  Note: Covid came back negative.      Problem: Safety  Goal: Patient will be free from injury during hospitalization  Outcome: Progressing  Flowsheets (Taken 08/05/2019 0327)  Patient will be free from injury during hospitalization:   Assess patient's risk for falls and implement fall prevention plan of care per policy   Ensure appropriate safety devices are available at the bedside   Provide and maintain safe environment   Use appropriate transfer methods   Hourly rounding  Goal: Patient will be free from infection during hospitalization  Outcome: Progressing  Note: Covid came back negative.      Problem: Pain  Goal: Pain at adequate level as identified by patient  Outcome: Progressing  Flowsheets (Taken 08/05/2019 0327)  Pain at adequate level as identified by patient:   Identify patient comfort function goal   Assess for risk of opioid induced respiratory depression, including snoring/sleep apnea. Alert healthcare team of risk factors identified.  Note: No complaint of pain, patient AOx4, independent.     Problem: Side Effects from Pain Analgesia  Goal: Patient will experience minimal side effects of analgesic therapy  Outcome: Progressing     Problem: Discharge Barriers  Goal: Patient will be discharged home or other facility with appropriate resources  Outcome: Progressing     Problem: Psychosocial and Spiritual Needs  Goal: Demonstrates ability to cope with hospitalization/illness  Outcome: Progressing     Problem: Fluid and Electrolyte Imbalance/ Endocrine  Goal: Fluid and electrolyte balance are achieved/maintained  Outcome: Progressing  Flowsheets (Taken 08/05/2019 0327)  Fluid and electrolyte balance are achieved/maintained:   Monitor intake and output every shift   Assess for confusion/personality changes   Observe for seizure activity and initiate seizure precautions if indicated   Monitor daily  weight   Monitor/assess lab values and report abnormal values   Assess and reassess fluid and electrolyte status   Observe for cardiac arrhythmias   Provide adequate hydration   Monitor for muscle weakness  Goal: Adequate hydration  Outcome: Progressing  Flowsheets (Taken 08/05/2019 0327)  Adequate hydration:   Assess mucus membranes, skin color, turgor, perfusion and presence of edema   Monitor and assess vital signs and perfusion   Assess for peripheral, sacral, periorbital and abdominal edema     Problem: Nutrition  Goal: Nutritional intake is adequate  Outcome: Progressing  Flowsheets (Taken 08/05/2019 0327)  Nutritional intake is adequate:   Monitor daily weights   Assist patient with meals/food selection   Allow adequate time for meals   Encourage/perform oral hygiene as appropriate  Goal: Patient maintains weight  Outcome: Progressing  Goal: Food and/or nutrient delivery  Outcome: Progressing     Problem: Diabetes: Glucose Imbalance  Goal: Blood glucose stable at established goal  Outcome: Progressing

## 2019-08-05 NOTE — Progress Notes (Signed)
[facility]  PROGRESS NOTE      Patient: Kent Jordan [facility]  Date: 08/05/2019   LOS: 0 Days  Admission Date: 08/03/2019   MRN: [id number]  Attending: Anola Gurney  Please contact me on the following Spectralink [phone]       ASSESSMENT/PLAN     Kent Jordan is a 68 y.o. male admitted with Dyspnea on exertion    Interval Summary:     Active Hospital Problems    Diagnosis    Chronic pulmonary embolism    SOB (shortness of breath)    Dyspnea on exertion    Chest tightness    Overweight (BMI 25.0-29.9)    Type 2 diabetes mellitus without complication, without long-term current use of insulin    Essential hypertension       Chronic pulmonary VTE disease causing dyspnea  Hypertension  DM2  Hemochromatosis, well controlled causing chronic hepatitis   Solitary pulmonary nodule, stable  HLP/hypertriglyceridemia         PFTs showing no obstruction; showing decreased DLCO   CT showing chronic pulm VTE   Obtain V/Q scan to assess other areas that may not have been seen on CTA  Start eliquis  Reassuring echo showing no RV strain of pulmonary HTN  Stress testing earlier this year showed no evidence of ischemia  Ambulatory pulse ox - assess for hypoxia  B/l lower extremity dopplers  Age appropriate cancer screening      Analgesia: Acetaminophen    Nutrition: Heart healthy    DVT Prophylaxis: Lovenox       Code Status: Full code    DISPO: Discharge tomorrow if stable O2 requirements    Family Contact: Spouse       SUBJECTIVE     Isreal Haubner Jordan states feels well, no dyspnea today.    11/11- Feeling slightly improved today.  No chest pain.  Undergoing PFTs, CTA.      MEDICATIONS     Current Facility-Administered Medications   Medication Dose Route Frequency    albuterol  2.5 mg Nebulization On Call    amLODIPine  10 mg Oral Daily    apixaban  10 mg Oral Q12H SCH    Followed by    Kent Jordan ON 08/12/2019] apixaban  5 mg Oral Q12H [facility]    celecoxib  200 mg Oral BID    DULoxetine  60 mg Oral Daily    enoxaparin  40 mg  Subcutaneous Daily    folic acid  400 mcg Oral Daily    hydroCHLOROthiazide  25 mg Oral Daily    insulin glargine  5 Units Subcutaneous Daily    insulin lispro  1-5 Units Subcutaneous TID AC    metoprolol succinate XL  50 mg Oral Daily    rosuvastatin  5 mg Oral QHS       PHYSICAL EXAM     Vitals:    08/05/19 1900   BP: 155/85   Pulse: 65   Resp: 18   Temp: 98.3 F (36.8 C)   SpO2: 96%       Temperature: Temp  Min: 97.7 F (36.5 C)  Max: 98.3 F (36.8 C)  Pulse: Pulse  Min: 60  Max: 68  Respiratory: Resp  Min: 16  Max: 18  Non-Invasive BP: BP  Min: 139/82  Max: 155/85  Pulse Oximetry SpO2  Min: 94 %  Max: 97 %    Intake and Output Summary (Last 24 hours) at Date Time    Intake/Output Summary (  Last 24 hours) at 08/05/2019 2024  Last data filed at 08/05/2019 1858  Gross per 24 hour   Intake 1520 ml   Output --   Net 1520 ml         GEN APPEARANCE: Normal;  A&OX3  HEENT: PERLA; EOMI; Conjunctiva Clear  NECK: Supple; No bruits  CVS: RRR, S1, S2; No M/G/R  LUNGS: CTAB; No Wheezes; No Rhonchi: No rales  ABD: Soft; No TTP; + Normoactive BS  EXT: No edema; Pulses 2+ and intact  Skin exam: No lesions  NEURO: CN 2-12 intact; No Focal neurological deficits  CAP REFILL:  Normal  MENTAL STATUS:  Normal    Exam done by Anola Gurney, MD on 08/05/19 at 6:10 PM      LABS     Recent Labs   Lab 08/05/19  0426 08/04/19  0338 08/03/19  1120   WBC 5.55 6.24 8.23   RBC 3.50* 3.46* 3.71*   Hgb 11.3* 11.3* 12.2*   Hematocrit 32.9* 34.0* 35.0*   MCV 94.0 98.3* 94.3   Platelets 305 303 294       Recent Labs   Lab 08/05/19  0426 08/04/19  0338 08/03/19  1120   Sodium 139 142 137   Potassium 4.0 4.7 3.8   Chloride 105 107 104   CO2 22 21* 20*   BUN 12.0 12.0 11.0   Creatinine 0.8 0.9 0.9   Glucose 191* 226* 167*   Calcium 8.8 9.0 9.4   Magnesium  --  2.0  --        Recent Labs   Lab 08/03/19  1120   ALT 115*   AST (SGOT) 79*   Bilirubin, Total 0.7   Albumin 4.0   Alkaline Phosphatase 89       Recent Labs   Lab 08/03/19  2105  08/03/19  1623 08/03/19  1120   Troponin I <0.01 <0.01 0.02       Recent Labs   Lab 08/03/19  1221 08/03/19  1120   PT INR 1.0 See Note   PT 12.6 See Note   PTT 22* see comment       Microbiology Results     Procedure Component Value Units Date/Time    COVID-19 (SARS-COV-2) ([facility] Standard test) [[id number]] Collected: 08/03/19 1258    Specimen: Nasopharyngeal Swab from Nasopharynx Updated: 08/03/19 1525     Purpose of COVID testing Diagnostic -PUI     SARS-CoV-2 Specimen Source Nasopharyngeal    Narrative:      o Collect and clearly label specimen type:  o PREFERRED-Upper respiratory specimen: One Nasopharyngeal  Swab in Transport Media.  o Hand deliver to laboratory ASAP           Ct Angiogram Chest    Result Date: 08/05/2019  Suggestion of chronic nonocclusive pulmonary emboli to the lingular branch of the left pulmonary artery. No evidence of a large central pulmonary emboli or aortic dissection. Stable 8 mm solid nodule in the left lower lobe.  Joselyn Glassman, MD  08/05/2019 7:49 PM    Xr Chest Ap Portable    Result Date: 08/03/2019   No acute pulmonary or pleural disease. [name], MD  08/03/2019 11:16 AM        Echo Results     None          RADIOLOGY     Upon my review:    Chest x-ray independently reviewed dated 08/03/2019: Hyperinflated lungs, cardiomegaly, no pleural effusion, no consolidation, no  cephalization.    [name]  8:24 PM 08/05/2019

## 2019-08-05 NOTE — Progress Notes (Addendum)
[facility]  PROGRESS NOTE  [facility]     Date Time: 08/05/19 8:44 AM  Patient Name: Kent, Jordan     Patient Active Problem List   Diagnosis    Mixed hyperlipidemia    Essential hypertension    Bigeminal rhythm    Arrhythmia    Primary osteoarthritis of right knee    Status post right knee replacement    Type 2 diabetes mellitus without complication, without long-term current use of insulin    Other hemochromatosis    Acute bilateral low back pain with sciatica, sciatica laterality unspecified    Anterior knee pain, right    Overweight (BMI 25.0-29.9)    Elevated LFTs    Psoriatic arthritis    SOB (shortness of breath)    Dyspnea on exertion    Chest tightness     Assessment:    Patient admitted 08/03/2019 with shortness of breath   Negative BNP and troponin   No infiltrate on CXR   Normal ECG   Echocardiogram today- mild concentric LVH, normal LVEF 55-60%, normal PAP 22 mmHg, no significant valvular disease.    Normal nuclear ST without evidence of myocardial ischemia 10/18/2015   HTN   HLD, on statin   DM, A1c 6.9   OSA   Elevated LFTs   Prior tobacco use (40-pack-year history)   History of hemochromatosis   SARS CoV 2 Overall Result - negative 08/03/19      Recommendations:      Pulmonary w/u in progress - PFT's and CT angiogram    Per discussion with Dr. Mariah Milling - if no answer regarding current symptoms from pulmonary evaluation - would consider coronary CT angiogram or possibly cath to r/o significant L Main/ multivessel CAD which could possibly explain c/o low level exertional SOB. If Kent Jordan  remains hospitalized tonight would keep NPO in AM tomorrow for CT angio - otherwise can do as outpatient.       Subjective:   Denies chest pain, SOB or palpitations.  Ambulated in hall with mild (significantly improved) SOB.    Medications:      Scheduled Meds: PRN Meds:    amLODIPine, 10 mg, Oral, Daily  aspirin EC, 81 mg, Oral, Daily  celecoxib, 200 mg, Oral,  BID  DULoxetine, 60 mg, Oral, Daily  enoxaparin, 40 mg, Subcutaneous, Daily  folic acid, 400 mcg, Oral, Daily  hydroCHLOROthiazide, 25 mg, Oral, Daily  insulin lispro, 1-5 Units, Subcutaneous, TID AC  metoprolol succinate XL, 50 mg, Oral, Daily  rosuvastatin, 5 mg, Oral, QHS        Continuous Infusions:   acetaminophen, 650 mg, Q6H PRN    Or  acetaminophen, 650 mg, Q6H PRN  acetaminophen, 650 mg, Once PRN  dextrose, 15 g of glucose, PRN    And  dextrose, 12.5 g, PRN    And  glucagon (rDNA), 1 mg, PRN  naloxone, 0.2 mg, PRN  ondansetron, 4 mg, Once PRN  ondansetron, 4 mg, Q6H PRN    Or  ondansetron, 4 mg, Q6H PRN  polyethylene glycol, 17 g, Daily PRN  traZODone, 50 mg, QHS PRN          Home Meds:   Medications Prior to Admission   Medication Sig Dispense Refill Last Dose    amLODIPine (NORVASC) 10 MG tablet Take 10 mg by mouth daily          aspirin 81 MG tablet Take 81 mg by mouth daily.       celecoxib (CeleBREX)  200 MG capsule Take 200 mg by mouth 2 (two) times daily       DULoxetine (CYMBALTA) 60 MG capsule Take 60 mg by mouth daily.       folic acid (FOLVITE) 400 MCG tablet Take 400 mcg by mouth daily.       hydroCHLOROthiazide (HYDRODIURIL) 25 MG tablet Take 25 mg by mouth daily.          metFORMIN (GLUCOPHAGE-XR) 500 MG 24 hr tablet TAKE 1 TABLET (500 MG TOTAL) BY MOUTH EVERY MORNING WITH BREAKFAST 90 tablet 0     metoprolol succinate XL (TOPROL-XL) 25 MG 24 hr tablet Take 25 mg by mouth daily     3     MILK THISTLE PO Take by mouth       Omega-3 Fatty Acids (Omega-3 Fish Oil) 500 MG Cap Take 1,400 mg by mouth daily          rosuvastatin (CRESTOR) 5 MG tablet Take 1 tablet (5 mg total) by mouth daily 90 tablet 1     Secukinumab, 300 MG Dose, (Cosentyx, 300 MG Dose,) 150 MG/ML Solution Prefilled Syringe Inject into the skin       traZODone (DESYREL) 50 MG tablet Take 50 mg by mouth nightly as needed.           [EXPIRED] azithromycin (ZITHROMAX) 250 MG tablet Take 2 tablets (500 mg) on  Day 1,   followed by 1 tablet (250 mg) once daily on Days 2 through 5. 6 tablet 0     ondansetron (ZOFRAN-ODT) 4 MG disintegrating tablet Take 1 tablet (4 mg total) by mouth every 6 (six) hours as needed for Nausea 8 tablet 0           Physical Exam:   BP 144/78    Pulse 68    Temp 97.9 F (36.6 C) (Oral)    Resp 16    Ht 1.803 m (5\' 11" )    Wt 93 kg (205 lb 0.4 oz)    SpO2 95%    BMI 28.60 kg/m  O2 Flow Rate (L/min): 0 L/min O2 Device: None (Room air)    Temp (24hrs), Avg:98 F (36.7 C), Min:97.7 F (36.5 C), Max:98.4 F (36.9 C)      Telemetry reviewed - NSR     Intake and Output Summary (Last 24 hours) at Date Time    Intake/Output Summary (Last 24 hours) at 08/05/2019 0844  Last data filed at 08/04/2019 2100  Gross per 24 hour   Intake 700 ml   Output --   Net 700 ml       General Appearance:  No acute distress  Head:  normocephalic  Eyes:  EOM's intact  Neck:  No carotid bruit or jugular venous distension  Lungs:  Clear to auscultation without wheezes, rhonchi or rales  Chest Wall:  No tenderness or deformity  Heart:  S1 S2 normal No S3 or S4, without murmur/rub.  PMI non displaced   Abdomen:  Soft, non-tender, positive bowel sounds  Extremities:  No edema  Pulses:  2+ pedal, radial, brachial pulses equal bilateraly  Neurologic:  Alert and oriented x3, mood and affect normal  Musculoskeletal: normal strength and tone    Labs:     Recent Labs   Lab 08/03/19  2105 08/03/19  1623 08/03/19  1120   Troponin I <0.01 <0.01 0.02     Recent Labs   Lab 08/03/19  1120   Bilirubin, Total 0.7   Protein, Total 7.0  Albumin 4.0   ALT 115*   AST (SGOT) 79*     Recent Labs   Lab 08/04/19  0338   Magnesium 2.0     Recent Labs   Lab 08/03/19  1221   PT 12.6   PT INR 1.0   PTT 22*     Recent Labs   Lab 08/05/19  0426 08/04/19  0338 08/03/19  1120   WBC 5.55 6.24 8.23   Hgb 11.3* 11.3* 12.2*   Hematocrit 32.9* 34.0* 35.0*   Platelets 305 303 294     Recent Labs   Lab 08/05/19  0426 08/04/19  0338 08/03/19  1120   Sodium 139 142 137    Potassium 4.0 4.7 3.8   Chloride 105 107 104   CO2 22 21* 20*   BUN 12.0 12.0 11.0   Creatinine 0.8 0.9 0.9   EGFR >60.0 >60.0 >60.0   Glucose 191* 226* 167*   Calcium 8.8 9.0 9.4       Lab Results   Component Value Date    BNP 77 08/03/2019       Weight Monitoring 03/25/2019 04/09/2019 07/17/2019 07/27/2019 08/03/2019 08/03/2019 08/04/2019   Height 180.3 cm 180.3 cm 180.3 cm 177.8 cm 180.3 cm 180.3 cm -   Height Method - - - Stated - - -   Weight 94.348 kg 93.895 kg 92.534 kg 92.08 kg 92.987 kg 92.987 kg 93 kg   Weight Method - - - Stated - - -   BMI (calculated) 29.1 kg/m2 28.9 kg/m2 28.5 kg/m2 29.2 kg/m2 28.7 kg/m2 28.7 kg/m2 -         Imaging:     XR CHEST AP PORTABLE  08/03/2019    CLINICAL INDICATION:   sob, r/o pna    COMPARISON: 07/27/2019    FINDINGS: The cardiomediastinal silhouette appears within normal size  limits for portable technique and patient positioning. There is no  evidence for focal airspace consolidation, pleural effusion, or  pneumothorax. The pulmonary vascularity appears unremarkable.    IMPRESSION:    No acute pulmonary or pleural disease.    [name], MD   08/03/2019 11:16 AM        Signed by: Jenel Lucks, NP       Patient seen and examined, my assessment and plan as above.  Osualdo Hansell P. Letitia Neri MD      [facility]  NP Spectralink [phone] (8am-5pm)  MD Spectralink [phone] (8am-5pm)  After hours, non urgent consult line [phone]  After Hours, urgent consults or to reach the on-cal MD [phone]

## 2019-08-06 ENCOUNTER — Ambulatory Visit (INDEPENDENT_AMBULATORY_CARE_PROVIDER_SITE_OTHER): Payer: Self-pay

## 2019-08-06 ENCOUNTER — Inpatient Hospital Stay: Payer: Medicare Other

## 2019-08-06 LAB — CBC
Absolute NRBC: 0 10*3/uL (ref 0.00–0.00)
Hematocrit: 33.7 % — ABNORMAL LOW (ref 37.6–49.6)
Hgb: 11.5 g/dL — ABNORMAL LOW (ref 12.5–17.1)
MCH: 32.3 pg (ref 25.1–33.5)
MCHC: 34.1 g/dL (ref 31.5–35.8)
MCV: 94.7 fL (ref 78.0–96.0)
MPV: 9.2 fL (ref 8.9–12.5)
Nucleated RBC: 0 /100 WBC (ref 0.0–0.0)
Platelets: 309 10*3/uL (ref 142–346)
RBC: 3.56 10*6/uL — ABNORMAL LOW (ref 4.20–5.90)
RDW: 13 % (ref 11–15)
WBC: 6.77 10*3/uL (ref 3.10–9.50)

## 2019-08-06 LAB — RENAL FUNCTION PANEL
Anion Gap: 10 (ref 5.0–15.0)
BUN: 15 mg/dL (ref 9.0–28.0)
CO2: 25 mEq/L (ref 22–29)
Calcium: 9 mg/dL (ref 8.5–10.5)
Chloride: 104 mEq/L (ref 100–111)
Creatinine: 1 mg/dL (ref 0.7–1.3)
Glucose: 174 mg/dL — ABNORMAL HIGH (ref 70–100)
Phosphorus: 5.2 mg/dL — ABNORMAL HIGH (ref 2.3–4.7)
Potassium: 4.2 mEq/L (ref 3.5–5.1)
Sodium: 139 mEq/L (ref 136–145)

## 2019-08-06 LAB — HEMOLYSIS INDEX
Hemolysis Index: 4 (ref 0–18)
Hemolysis Index: 12 (ref 0–18)

## 2019-08-06 LAB — MAGNESIUM: Magnesium: 2 mg/dL (ref 1.6–2.6)

## 2019-08-06 LAB — HEMOGLOBIN A1C
Average Estimated Glucose: 139.9 mg/dL
Hemoglobin A1C: 6.5 % — ABNORMAL HIGH (ref 4.6–5.9)

## 2019-08-06 LAB — HEPATIC FUNCTION PANEL
ALT: 88 U/L — ABNORMAL HIGH (ref 0–55)
AST (SGOT): 52 U/L — ABNORMAL HIGH (ref 5–34)
Albumin/Globulin Ratio: 1.3 (ref 0.9–2.2)
Albumin: 3.7 g/dL (ref 3.5–5.0)
Alkaline Phosphatase: 88 U/L (ref 38–106)
Bilirubin Direct: 0.2 mg/dL (ref 0.0–0.5)
Bilirubin Indirect: 0.5 mg/dL (ref 0.2–1.0)
Bilirubin, Total: 0.7 mg/dL (ref 0.2–1.2)
Globulin: 2.8 g/dL (ref 2.0–3.6)
Protein, Total: 6.5 g/dL (ref 6.0–8.3)

## 2019-08-06 LAB — GFR: EGFR: >60

## 2019-08-06 LAB — VITAMIN B12: Vitamin B-12: 979 pg/mL — ABNORMAL HIGH (ref 211–911)

## 2019-08-06 LAB — GLUCOSE WHOLE BLOOD - POCT: Whole Blood Glucose POCT: 143 mg/dL — ABNORMAL HIGH (ref 70–100)

## 2019-08-06 MED ORDER — PANTOPRAZOLE SODIUM 20 MG PO TBEC
20.00 mg | DELAYED_RELEASE_TABLET | Freq: Every day | ORAL | 1 refills | Status: DC
Start: 2019-08-06 — End: 2019-10-13

## 2019-08-06 MED ORDER — TECHNETIUM TC 99M ALBUMIN AGGREGATED
5.00 | Freq: Once | Status: AC | PRN
Start: 2019-08-06 — End: 2019-08-06
  Administered 2019-08-06: 5 via INTRAVENOUS

## 2019-08-06 MED ORDER — ELIQUIS DVT/PE STARTER PACK 5 MG PO TBPK
ORAL_TABLET | ORAL | 0 refills | Status: AC
Start: 2019-08-06 — End: 2019-09-05

## 2019-08-06 MED ORDER — APIXABAN 5 MG PO TABS
5.0000 mg | ORAL_TABLET | Freq: Two times a day (BID) | ORAL | 1 refills | Status: DC
Start: 2019-09-05 — End: 2019-10-13

## 2019-08-06 NOTE — Progress Notes (Addendum)
HEART  PROGRESS NOTE  [facility]     Date Time: 08/06/19 9:28 AM  Patient Name: Kent Jordan, Kent Jordan     Patient Active Problem List   Diagnosis    Mixed hyperlipidemia    Essential hypertension    Bigeminal rhythm    Arrhythmia    Primary osteoarthritis of right knee    Status post right knee replacement    Type 2 diabetes mellitus without complication, without long-term current use of insulin    Other hemochromatosis    Acute bilateral low back pain with sciatica, sciatica laterality unspecified    Anterior knee pain, right    Overweight (BMI 25.0-29.9)    Elevated LFTs    Psoriatic arthritis    SOB (shortness of breath)    Dyspnea on exertion    Chest tightness    Chronic pulmonary embolism     Assessment:    Patient admitted 08/03/2019 with shortness of breath   Negative BNP and troponin.   No infiltrate on CXR.   Normal ECG.   CT angio of chest (08/05/19) suggested chronic nonocclusive pulmonary emboli to the lingular branch of the left pulmonary artery. Stable 8 cm solid nodule of left lower lobe seen.   Has been started on OAC (eliquis).     Echocardiogram - mild concentric LVH, normal LVEF 55-60%, normal PAP 22 mmHg, no significant valvular disease.    Normal nuclear ST without evidence of myocardial ischemia 10/18/2015.  Had plans for inpatient coronary CTA testing but given contrast load from chest CT, will be done as an outpatient.     HTN.  Controlled on outpatient medications (metoprolol dose has been increased from 25 to 50 daily).     HLD, on statin.   DM, A1c 6.9.   OSA.   Elevated LFTs.   Prior tobacco use (40-pack-year history).   History of hemochromatosis.   SARS CoV 2 Overall Result - negative 08/03/19.    Recommendations:    Outpatient coronary CT angiogram as patient has had contrast within the past 24 hours.   Plan discussed with patient and Dr. Mariah Milling.      Subjective:   Denies chest pain or palpitations.  Improved SOB. Ambulating in room and  hallway.    Medications:      Scheduled Meds: PRN Meds:    amLODIPine, 10 mg, Oral, Daily  apixaban, 10 mg, Oral, Q12H SCH    Followed by  [name] ON 08/12/2019] apixaban, 5 mg, Oral, Q12H SCH  celecoxib, 200 mg, Oral, BID  DULoxetine, 60 mg, Oral, Daily  famotidine, 20 mg, Oral, Q12H SCH  folic acid, 400 mcg, Oral, Daily  hydroCHLOROthiazide, 25 mg, Oral, Daily  insulin glargine, 5 Units, Subcutaneous, Daily  insulin lispro, 1-5 Units, Subcutaneous, TID AC  metoprolol succinate XL, 50 mg, Oral, Daily  rosuvastatin, 5 mg, Oral, QHS        Continuous Infusions:   acetaminophen, 650 mg, Q6H PRN    Or  acetaminophen, 650 mg, Q6H PRN  acetaminophen, 650 mg, Once PRN  dextrose, 15 g of glucose, PRN    And  dextrose, 12.5 g, PRN    And  glucagon (rDNA), 1 mg, PRN  naloxone, 0.2 mg, PRN  ondansetron, 4 mg, Once PRN  ondansetron, 4 mg, Q6H PRN    Or  ondansetron, 4 mg, Q6H PRN  polyethylene glycol, 17 g, Daily PRN  traZODone, 50 mg, QHS PRN          Home Meds:   Medications  Prior to Admission   Medication Sig Dispense Refill Last Dose    amLODIPine (NORVASC) 10 MG tablet Take 10 mg by mouth daily          aspirin 81 MG tablet Take 81 mg by mouth daily.       celecoxib (CeleBREX) 200 MG capsule Take 200 mg by mouth 2 (two) times daily       DULoxetine (CYMBALTA) 60 MG capsule Take 60 mg by mouth daily.       folic acid (FOLVITE) 400 MCG tablet Take 400 mcg by mouth daily.       hydroCHLOROthiazide (HYDRODIURIL) 25 MG tablet Take 25 mg by mouth daily.          metFORMIN (GLUCOPHAGE-XR) 500 MG 24 hr tablet TAKE 1 TABLET (500 MG TOTAL) BY MOUTH EVERY MORNING WITH BREAKFAST 90 tablet 0     metoprolol succinate XL (TOPROL-XL) 25 MG 24 hr tablet Take 25 mg by mouth daily     3     MILK THISTLE PO Take by mouth       Omega-3 Fatty Acids (Omega-3 Fish Oil) 500 MG Cap Take 1,400 mg by mouth daily          rosuvastatin (CRESTOR) 5 MG tablet Take 1 tablet (5 mg total) by mouth daily 90 tablet 1     Secukinumab, 300 MG Dose,  (Cosentyx, 300 MG Dose,) 150 MG/ML Solution Prefilled Syringe Inject into the skin       traZODone (DESYREL) 50 MG tablet Take 50 mg by mouth nightly as needed.           [EXPIRED] azithromycin (ZITHROMAX) 250 MG tablet Take 2 tablets (500 mg) on  Lang Zingg 1,  followed by 1 tablet (250 mg) once daily on Days 2 through 5. 6 tablet 0     ondansetron (ZOFRAN-ODT) 4 MG disintegrating tablet Take 1 tablet (4 mg total) by mouth every 6 (six) hours as needed for Nausea 8 tablet 0           Physical Exam:   BP 143/78    Pulse 68    Temp 99.1 F (37.3 C) (Oral)    Resp 16    Ht 1.803 m (5\' 11" )    Wt 93 kg (205 lb 0.4 oz)    SpO2 98%    BMI 28.60 kg/m  O2 Flow Rate (L/min): 0 L/min O2 Device: None (Room air)    Temp (24hrs), Avg:98.2 F (36.8 C), Min:97.6 F (36.4 C), Max:99.1 F (37.3 C)      Telemetry reviewed - NSR     Intake and Output Summary (Last 24 hours) at Date Time    Intake/Output Summary (Last 24 hours) at 08/06/2019 1610  Last data filed at 08/06/2019 0913  Gross per 24 hour   Intake 1700 ml   Output --   Net 1700 ml       General Appearance:  No acute distress  Head:  normocephalic  Eyes:  EOM's intact  Neck:  No carotid bruit or jugular venous distension  Lungs:  Clear to auscultation without wheezes, rhonchi or rales  Chest Wall:  No tenderness or deformity  Heart:  S1 S2 normal No S3 or S4, without murmur/rub.  PMI non displaced   Abdomen:  Soft, non-tender, positive bowel sounds  Extremities:  No edema  Pulses:  2+ pedal, radial, brachial pulses equal bilateraly  Neurologic:  Alert and oriented x3, mood and affect normal  Musculoskeletal: normal strength and tone  Labs:     Recent Labs   Lab 08/03/19  2105 08/03/19  1623 08/03/19  1120   Troponin I <0.01 <0.01 0.02     Recent Labs   Lab 08/06/19  0336   Bilirubin, Total 0.7   Bilirubin Direct 0.2   Protein, Total 6.5   Albumin 3.7   ALT 88*   AST (SGOT) 52*     Recent Labs   Lab 08/06/19  0336   Magnesium 2.0     Recent Labs   Lab 08/03/19  1221   PT  12.6   PT INR 1.0   PTT 22*     Recent Labs   Lab 08/06/19  0335 08/05/19  0426 08/04/19  0338   WBC 6.77 5.55 6.24   Hgb 11.5* 11.3* 11.3*   Hematocrit 33.7* 32.9* 34.0*   Platelets 309 305 303     Recent Labs   Lab 08/06/19  0336 08/05/19  0426 08/04/19  0338   Sodium 139 139 142   Potassium 4.2 4.0 4.7   Chloride 104 105 107   CO2 25 22 21*   BUN 15.0 12.0 12.0   Creatinine 1.0 0.8 0.9   EGFR >60.0 >60.0 >60.0   Glucose 174* 191* 226*   Calcium 9.0 8.8 9.0       Lab Results   Component Value Date    BNP 77 08/03/2019       Weight Monitoring 03/25/2019 04/09/2019 07/17/2019 07/27/2019 08/03/2019 08/03/2019 08/04/2019   Height 180.3 cm 180.3 cm 180.3 cm 177.8 cm 180.3 cm 180.3 cm -   Height Method - - - Stated - - -   Weight 94.348 kg 93.895 kg 92.534 kg 92.08 kg 92.987 kg 92.987 kg 93 kg   Weight Method - - - Stated - - -   BMI (calculated) 29.1 kg/m2 28.9 kg/m2 28.5 kg/m2 29.2 kg/m2 28.7 kg/m2 28.7 kg/m2 -         Signed by: Jenel Lucks, NP     Seen and examined.  Our assessment and plan as above.  Karle Starch, MD.          [facility]  NP Spectralink [phone] (8am-5pm)  MD Spectralink [phone] (8am-5pm)  After hours, non urgent consult line [phone]  After Hours, urgent consults or to reach the on-cal MD [phone]

## 2019-08-06 NOTE — Progress Notes (Addendum)
Discharge instruction given to Pt, verbalized understandings, signed d/c paper in the chart. Reviewed new meds with Pt, prescriptions sent to the pharmacy, verbalized understandings. IV access removed. Tele box returned to tele room. All belongings with Pt. Awaiting for Pt's wife for a ride. Will contact transporter.

## 2019-08-06 NOTE — Discharge Summary (Signed)
Discharge Summary    Date:08/06/2019   Patient Name: Kent Jordan, Kent Jordan  Attending Physician: No att. 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 No renal stone or hydronephrosis. 2. No evidence of acute intraperitoneal process in this unenhanced study. 3. Post surgical changes of the lumbar spine and multilevel degenerative changes Heron Nay, MD  07/27/2019 7:51 PM    Ct Head Without Contrast    Result Date: 07/27/2019  No acute abnormality detected. Carleene Overlie, MD  07/27/2019 7:40 PM    Ct Angiogram Chest    Result Date: 08/05/2019  Suggestion of chronic nonocclusive pulmonary emboli to the lingular branch of the left pulmonary artery. No evidence of a large central pulmonary emboli or aortic dissection. Stable 8 mm solid nodule in the left lower lobe.  Joselyn Glassman, MD  08/05/2019 7:49 PM    Nm Lung Scan Perfusion Particulate    Result Date: 08/06/2019   There is no evidence of acute pulmonary embolus or of chronic recurrent pulmonary embolus on scintigraphy Review of the patient's prior CT does demonstrate a tiny filling defect in the lobar branch of the left pulmonary artery as well as irregularity of the lumen of the lingular branch of the left pulmonary artery which may represent sequela of recannulation of prior  pulmonary embolus unfortunately we do not have prior CT scans of the chest with IV contrast available to help [place] determine the acuity of these findings Laurena Slimmer, MD  08/06/2019 11:49 AM    Xr Chest Ap Portable    Result Date: 08/03/2019   No acute pulmonary or pleural disease. [name], MD  08/03/2019 11:16 AM    Chest Ap Portable    Result Date: 07/27/2019   No acute process. Heron Nay, MD  07/27/2019 7:16 PM    US Venous Low Extrem Duplx Dopp Comp Bilat    Result Date: 08/05/2019  Negative for DVT or central venous obstruction of the bilateral lower extremities. Venu Vadlamudi  08/05/2019 9:42 PM    Nm Spect Ct Fusion Sgl Area Sgl Day    Result Date: 08/06/2019   There is no evidence of acute pulmonary embolus or of chronic recurrent pulmonary embolus on scintigraphy Review of the patient's prior CT does demonstrate a tiny filling defect in the lobar branch of the left pulmonary artery as well as irregularity of the lumen of the lingular branch of the left pulmonary artery which may represent sequela of recannulation of prior pulmonary embolus unfortunately we do not have prior CT scans of the chest with IV contrast available to help [place] determine the acuity of these findings Laurena Slimmer, MD  08/06/2019 11:49 AM      Reason for Admission:   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  Patient underwent echo showing preserved EF.  Underwent CTA chest showing chronic nonocclusive PE.  Lower extremity Dopplers and VQ scan showed no acute issues.  Patient was started on Eliquis; pulmonary recommended 39-month therapy.  Patient was discharged in improved condition without evidence of hypoxia with ambulation    Exam on discharge:  GEN APPEARANCE:  Normal;  A&OX3  Eyes: PERRL, EOMI  ENMT:  MMM, oropharynx clear  Resp:  CTAB  CV:  RRR, no r/m/g  GI:  NABS, NT/ND, no organomegaly  MSK:  FROM of the knees, ankles, elbows and wrists b/l without pain  Ext:  no edema  Neuro:  5/5 strength of the upper and lower extremities b/l; sensory intact to light touch; CN II-XII intact; finger to nose and heel to shin intact; gait intact  Skin:  No rashes  Psych: Normal affect, normal judgement    Condition at Discharge:   Improved  Today:     BP 154/69    Pulse 70    Temp 98 F (36.7 C) (Oral)    Resp 16    Ht 1.803 m (5\' 11" )    Wt 93 kg (205 lb 0.4 oz)    SpO2 99% Comment: 98-99% While ambulating    BMI 28.60 kg/m   Ranges for the last 24 hours:  Temp:  [97.6 F (36.4 C)-99.1 F (37.3 C)] 98 F (36.7 C)  Heart Rate:  [56-76] 70  Resp Rate:  [16-18] 16  BP: (119-173)/(69-78) 154/69    Last set of labs       Micro / Labs / Path pending:     Unresulted Labs     Procedure . . . Date/Time    TSH [[id number]] Collected: 08/03/19 2105    Specimen: Blood Updated: 08/03/19 2105          Discharge Instructions For Providers     1. Recommend 58-month of anticoagulation; follow-up with pulmonary as an outpatient             Discharge Instructions:     Follow-up Information     Herscowitz, Barbette Hair, MD Follow up in 1 week(s).    Specialty: Pulmonary Disease  Why: Work-up for shortness of breath  Contact information:  [address]  [place] [address]  [phone]             Day, Bertram Millard, MD Follow up in 2 week(s).    Specialties: Cardiology, Interventional Cardiology  Why: s/p hospitalization: SOB. Outpatient coronary CT angiogram.   Contact information:  [address]  [address]  [place] [address]  [phone]             Erasmo Score, MD .    Specialty: Family Medicine  Contact information:  [address]  [address]  [place] [address]  [phone]                   Discharge Diet: Cardiac Diet          Disposition:  Home or Self Care     Discharge Medication List       Taking    amLODIPine 10 MG tablet  Dose: 10 mg  Commonly known as: NORVASC  For: High Blood Pressure Disorder  Take 10 mg by mouth daily  celecoxib 200 MG capsule  Dose: 200 mg  Commonly known as: CeleBREX  Take 200 mg by mouth 2 (two) times daily     Cosentyx (300 MG Dose) 150 MG/ML Sosy  Generic drug: Secukinumab (300 MG Dose)  Inject into the skin     DULoxetine 60 MG capsule  Dose: 60 mg  Commonly known as: CYMBALTA  Take 60 mg by mouth daily.     * Eliquis DVT/PE Starter Pack 5 MG Tbpk  Start taking on: August 06, 2019  Generic drug: Apixaban Starter Pack  Take 10 mg by mouth 2 (two) times daily for 7 days, THEN 5 mg 2 (two) times daily for 23 days.     * apixaban 5 MG  Dose: 5 mg  Commonly known as: ELIQUIS  Start taking on: September 05, 2019  Take 1 tablet (5 mg total) by mouth 2 (two) times daily     folic acid 400 MCG tablet  Dose: 400 mcg  Commonly known as: FOLVITE  Take 400 mcg by mouth daily.     hydroCHLOROthiazide 25 MG tablet  Dose: 25 mg  Commonly known as: HYDRODIURIL  Take 25 mg by mouth daily.      metFORMIN 500 MG 24 hr tablet  Dose: 500 mg  Commonly known as: GLUCOPHAGE-XR  TAKE 1 TABLET (500 MG TOTAL) BY MOUTH EVERY MORNING WITH BREAKFAST     metoprolol succinate XL 25 MG 24 hr tablet  Dose: 25 mg  Commonly known as: TOPROL-XL  Take 25 mg by mouth daily      MILK THISTLE PO  Take by mouth     Omega-3 Fish Oil 500 MG Caps  Dose: 1,400 mg  Take 1,400 mg by mouth daily      ondansetron 4 MG disintegrating tablet  Dose: 4 mg  Commonly known as: ZOFRAN-ODT  Take 1 tablet (4 mg total) by mouth every 6 (six) hours as needed for Nausea     pantoprazole 20 MG tablet  Dose: 20 mg  Commonly known as: PROTONIX  Take 1 tablet (20 mg total) by mouth daily     rosuvastatin 5 MG tablet  Dose: 5 mg  Commonly known as: CRESTOR  Take 1 tablet (5 mg total) by mouth daily     traZODone 50 MG tablet  Dose: 50 mg  Commonly known as: DESYREL  Take 50 mg by mouth nightly as needed.         * This list has 2  medication(s) that are the same as other medications prescribed for you. Read the directions carefully, and ask your doctor or other care provider to review them with you.            STOP taking these medications    aspirin 81 MG tablet     azithromycin 250 MG tablet  Commonly known as: ZITHROMAX          Minutes spent coordinating discharge and reviewing discharge plan: 40 minutes      Signed by: Anola Gurney, MD

## 2019-08-06 NOTE — Plan of Care (Addendum)
Problem: Safety  Goal: Patient will be free from injury during hospitalization  Outcome: Progressing  Flowsheets (Taken 08/06/2019 0348)  Patient will be free from injury during hospitalization:   Assess patient's risk for falls and implement fall prevention plan of care per policy   Use appropriate transfer methods   Provide and maintain safe environment   Ensure appropriate safety devices are available at the bedside   Include patient/ family/ care giver in decisions related to safety   Hourly rounding   Assess for patients risk for elopement and implement Elopement Risk Plan per policy     Problem: Pain  Goal: Pain at adequate level as identified by patient  Outcome: Progressing  Flowsheets (Taken 08/06/2019 0348)  Pain at adequate level as identified by patient:   Identify patient comfort function goal   Assess for risk of opioid induced respiratory depression, including snoring/sleep apnea. Alert healthcare team of risk factors identified.   Assess pain on admission, during daily assessment and/or before any "as needed" intervention(s)   Reassess pain within 30-60 minutes of any procedure/intervention, per Pain Assessment, Intervention, Reassessment (AIR) Cycle   Evaluate if patient comfort function goal is met   Evaluate patient's satisfaction with pain management progress   Include patient/patient care companion in decisions related to pain management as needed       Patient is A&O*4 and is able to ambulate independently with a cane.  CT Angio completed and is suggestive of chronic, nonocclusive pulmonary emboli.   Patient started on Eloquis.  Doppler of BLE completed and negative for DVT. Patient denies pain.  Patient reports mild dyspnea when walking.  PRN Trazodone administered as sleep aid.  Call bell and side table within reach.  Will Continue to monitor.

## 2019-08-06 NOTE — Plan of Care (Addendum)
Problem: Pain  Goal: Pain at adequate level as identified by patient  Outcome: Progressing  Flowsheets (Taken 08/06/2019 1021)  Pain at adequate level as identified by patient:   Identify patient comfort function goal   Assess for risk of opioid induced respiratory depression, including snoring/sleep apnea. Alert healthcare team of risk factors identified.   Assess pain on admission, during daily assessment and/or before any "as needed" intervention(s)   Evaluate if patient comfort function goal is met   Evaluate patient's satisfaction with pain management progress   Reassess pain within 30-60 minutes of any procedure/intervention, per Pain Assessment, Intervention, Reassessment (AIR) Cycle     Problem: Fluid and Electrolyte Imbalance/ Endocrine  Goal: Fluid and electrolyte balance are achieved/maintained  Outcome: Progressing  Flowsheets (Taken 08/06/2019 1021)  Fluid and electrolyte balance are achieved/maintained:   Monitor intake and output every shift   Monitor/assess lab values and report abnormal values   Provide adequate hydration   Assess and reassess fluid and electrolyte status   Monitor daily weight   Assess for confusion/personality changes   Observe for seizure activity and initiate seizure precautions if indicated   Observe for cardiac arrhythmias   Monitor for muscle weakness     Problem: Diabetes: Glucose Imbalance  Goal: Blood glucose stable at established goal  Outcome: Progressing  Flowsheets (Taken 08/06/2019 1021)  Blood glucose stable at established goal:   Monitor lab values   Monitor intake and output.  Notify LIP if urine output is < 30 mL/hour.   Follow fluid restrictions/IV/PO parameters   Include patient/family in decisions related to nutrition/dietary selections   Monitor/assess vital signs   Assess for hypoglycemia /hyperglycemia   Coordinate medication administration with meals, as indicated   Ensure appropriate diet and assess tolerance   Ensure adequate  hydration     Problem: Compromised Hemodynamic Status  Goal: Vital signs and fluid balance maintained/improved  Outcome: Progressing  Flowsheets (Taken 08/06/2019 1022)  Vital signs and fluid balance are maintained/improved:   Position patient for maximum circulation/cardiac output   Monitor intake and output. Notify LIP if urine output is less than 30 mL/hour.   Monitor/assess vitals and hemodynamic parameters with position changes   Monitor and compare daily weight   Monitor/assess lab values and report abnormal values     Problem: Inadequate Gas Exchange  Goal: Adequate oxygenation and improved ventilation  Outcome: Progressing  Flowsheets (Taken 08/06/2019 1022)  Adequate oxygenation and improved ventilation:   Assess lung sounds   Provide mechanical and oxygen support to facilitate gas exchange   Monitor SpO2 and treat as needed   Monitor and treat ETCO2   Position for maximum ventilatory efficiency   Teach/reinforce use of incentive spirometer 10 times per hour while awake, cough and deep breath as needed   Plan activities to conserve energy: plan rest periods   Increase activity as tolerated/progressive mobility       Pt alert and orientated x4, independent with a cane, was able to ambulate hallways. Pt denies any pain/SOB/chest pain at this time. CTA chest showed chronic pulmonary embolism on 08/05/19. Continue with PO Eliquis BID as ordered, anti-coagulant teaching provided. Continue with accucheck AC, insulin as needed per MD order. VQ scan done today. Maintained fall precaution, safety.

## 2019-08-06 NOTE — Progress Notes (Signed)
PROGRESS NOTE    Date Time: 08/06/19 2:28 PM  Patient Name: Kent Jordan, Kent Jordan      Subjective:   Feeling better less short of breath  Room air 9%  VQ scan no evidence of acute pulmonary embolism or chronic recurrent pulmonary embolism.    Medications:      Scheduled Meds: PRN Meds:    amLODIPine, 10 mg, Oral, Daily  apixaban, 10 mg, Oral, Q12H SCH    Followed by  [name] ON 08/12/2019] apixaban, 5 mg, Oral, Q12H SCH  celecoxib, 200 mg, Oral, BID  DULoxetine, 60 mg, Oral, Daily  famotidine, 20 mg, Oral, Q12H SCH  folic acid, 400 mcg, Oral, Daily  hydroCHLOROthiazide, 25 mg, Oral, Daily  insulin glargine, 5 Units, Subcutaneous, Daily  insulin lispro, 1-5 Units, Subcutaneous, TID AC  metoprolol succinate XL, 50 mg, Oral, Daily  rosuvastatin, 5 mg, Oral, QHS          Continuous Infusions:   acetaminophen, 650 mg, Q6H PRN    Or  acetaminophen, 650 mg, Q6H PRN  acetaminophen, 650 mg, Once PRN  dextrose, 15 g of glucose, PRN    And  dextrose, 12.5 g, PRN    And  glucagon (rDNA), 1 mg, PRN  naloxone, 0.2 mg, PRN  ondansetron, 4 mg, Once PRN  ondansetron, 4 mg, Q6H PRN    Or  ondansetron, 4 mg, Q6H PRN  polyethylene glycol, 17 g, Daily PRN  traZODone, 50 mg, QHS PRN            Review of Systems:   A comprehensive review of systems was: General ROS:[x]    negative other than :  Respiratory ROS:[]  cough,[x]  mild shortness of breath,  []  wheezing  Cardiovascular ROS:  []  chest pain []  dyspnea on exertion  Gastrointestinal ROS: []  abdominal pain, [] change in bowel habits,  [] black/ bloody stools  Genito-Urinary ROS: []  dysuria,[]  trouble voiding,[]  hematuria  Musculoskeletal ROS:[]  negative  Neurological ROS:[]  negative    Physical Exam:     Vitals:    08/06/19 1250   BP: 154/69   Pulse: 70   Resp:    Temp:    SpO2:        Intake and Output Summary (Last 24 hours) at Date Time    Intake/Output Summary (Last 24 hours) at 08/06/2019 1428  Last data filed at 08/06/2019 0913  Gross per 24 hour   Intake 1280 ml   Output    Net 1280 ml        General appearance [x]  alert, [x] well appearing,  [] no distress  Eyes -[]  pupils equal and[] reactive, -[]   extraocular eye movements intact  Mouth -[]  mucous membranes moist, [] pharynx normal without lesions  Neck -[]   supple,  [] no adenopathy, [] no JVD,[] Thyromegaly.  Lymphatics -[]  no palpable lymphadenopathy  Chest - [x] clear to auscultation, [x]  room air 95%, [] rales[]  rhonchi, [x] symmetric air entry  Heart - [x] normal rate, [] regular rhythm, [] normal S1, S2, []  murmurs,[]  rubs, []   gallops  Abdomen - [x] soft,[x]  nontender,[]  nondistended,[]  no masses[]  organomegaly  Neurological - [x] alert,[]  oriented x3,[]  normal speech,[]  no focal findings or movement disorder noted  Musculoskeletal - []  joint tenderness,[]  deformity [] swelling  Extremities -[]  peripheral pulses normal,[]   pedal edema,[]  clubbing []  cyanosis  Skin - [] normal coloration and turgor, []  rashes,[]   skin lesions noted                Labs:     Results     Procedure Component Value Units Date/Time    Glucose Whole Blood -  POCT [[id number]]  (Abnormal) Collected: 08/06/19 1208     Updated: 08/06/19 1224     Whole Blood Glucose POCT 143 mg/dL     Hemolysis index [[id number]] Collected: 08/06/19 0335     Updated: 08/06/19 1003     Hemolysis Index 12    Narrative:      This is NOT the correct Test for Patients with  Hemoglobinopathy.    Hemoglobin A1C [[id number]]  (Abnormal) Collected: 08/06/19 0335    Specimen: Blood Updated: 08/06/19 0950     Hemoglobin A1C 6.5 %      Average Estimated Glucose 139.9 mg/dL     Narrative:      This is NOT the correct Test for Patients with  Hemoglobinopathy.    Vitamin B12 [[id number]] Collected: 08/06/19 0335    Specimen: Blood Updated: 08/06/19 0902    Narrative:      This is NOT the correct Test for Patients with  Hemoglobinopathy.    Renal function panel [[id number]]  (Abnormal) Collected: 08/06/19 0336    Specimen: Blood Updated: 08/06/19 0412     Glucose 174 mg/dL      Sodium 244 mEq/L      Potassium 4.2 mEq/L       Chloride 104 mEq/L      CO2 25 mEq/L      BUN 15.0 mg/dL      Calcium 9.0 mg/dL      Creatinine 1.0 mg/dL      Phosphorus 5.2 mg/dL      Anion Gap 01.0    Narrative:      This is NOT the correct Test for Patients with  Hemoglobinopathy.    Hemolysis index [[id number]] Collected: 08/06/19 0336     Updated: 08/06/19 0412     Hemolysis Index 4    Narrative:      This is NOT the correct Test for Patients with  Hemoglobinopathy.    GFR [[id number]] Collected: 08/06/19 0336     Updated: 08/06/19 0412     EGFR >60.0    Narrative:      This is NOT the correct Test for Patients with  Hemoglobinopathy.    Hepatic function panel (LFT) [[id number]]  (Abnormal) Collected: 08/06/19 0336    Specimen: Blood Updated: 08/06/19 0412     Bilirubin, Total 0.7 mg/dL      Bilirubin Direct 0.2 mg/dL      Bilirubin Indirect 0.5 mg/dL      AST (SGOT) 52 U/L      ALT 88 U/L      Alkaline Phosphatase 88 U/L      Protein, Total 6.5 g/dL      Albumin 3.7 g/dL      Globulin 2.8 g/dL      Albumin/Globulin Ratio 1.3    Narrative:      This is NOT the correct Test for Patients with  Hemoglobinopathy.    Magnesium [[id number]] Collected: 08/06/19 0336    Specimen: Blood Updated: 08/06/19 0412     Magnesium 2.0 mg/dL     Narrative:      This is NOT the correct Test for Patients with  Hemoglobinopathy.    CBC without differential [[id number]]  (Abnormal) Collected: 08/06/19 0335    Specimen: Blood Updated: 08/06/19 0354     WBC 6.77 x10 3/uL      Hgb 11.5 g/dL      Hematocrit 09.3 %      Platelets 309 x10 3/uL      RBC 3.56 x10 6/uL  MCV 94.7 fL      MCH 32.3 pg      MCHC 34.1 g/dL      RDW 13 %      MPV 9.2 fL      Nucleated RBC 0.0 /100 WBC      Absolute NRBC 0.00 x10 3/uL     Narrative:      This is NOT the correct Test for Patients with  Hemoglobinopathy.    Glucose Whole Blood - POCT [[id number]]  (Abnormal) Collected: 08/05/19 2141     Updated: 08/05/19 2143     Whole Blood Glucose POCT 187 mg/dL     Glucose Whole Blood - POCT [[id number]]   (Abnormal) Collected: 08/05/19 1455     Updated: 08/05/19 1500     Whole Blood Glucose POCT 155 mg/dL           Rads:     Radiology Results (24 Hour)     Procedure Component Value Units Date/Time    NM Lung Scan Perfusion Particulate [[id number]] Collected: 08/06/19 1107    Order Status: Completed Updated: 08/06/19 1253    Narrative:      History: Abnormal CT scan    FINDINGS: SPECT-CT perfusion scan is performed the usual manner with 5.5  mCi technetium 70m and AA. The study is compared with the CTA performed  yesterday.    The study demonstrates homogeneous perfusion through both lungs without  evidence of chronic or acute pulmonary embolus.    There is a tiny focal region of abnormal density seen in the left lower  lobar branch of the left left pulmonary artery on the prior CT dated  08/05/2019 on image 116 series 11 which could represent sequela of an  old recannulated pulmonary embolus (rather than chronic recurrent  pulmonary embolus. The other abnormality seen in the left pulmonary  artery in the lingular branch on image 63 as reported yesterday may also  represent sequela of prior recannulated pulmonary embolus rather than  chronic recurrent pulmonary embolus          Impression:           There is no evidence of acute pulmonary embolus or of chronic recurrent  pulmonary embolus on scintigraphy    Review of the patient's prior CT does demonstrate a tiny filling defect  in the lobar branch of the left pulmonary artery as well as irregularity  of the lumen of the lingular branch of the left pulmonary artery which  may represent sequela of recannulation of prior pulmonary embolus  unfortunately we do not have prior CT scans of the chest with IV  contrast available to help [place] determine the acuity of these findings    Laurena Slimmer, MD   08/06/2019 11:49 AM    NM Spect CT Fusion Sgl Area Sgl Day [[id number]] Collected: 08/06/19 1107    Order Status: Completed Updated: 08/06/19 1253    Narrative:      History:  Abnormal CT scan    FINDINGS: SPECT-CT perfusion scan is performed the usual manner with 5.5  mCi technetium 83m and AA. The study is compared with the CTA performed  yesterday.    The study demonstrates homogeneous perfusion through both lungs without  evidence of chronic or acute pulmonary embolus.    There is a tiny focal region of abnormal density seen in the left lower  lobar branch of the left left pulmonary artery on the prior CT dated  08/05/2019 on image 116 series 11 which could  represent sequela of an  old recannulated pulmonary embolus (rather than chronic recurrent  pulmonary embolus. The other abnormality seen in the left pulmonary  artery in the lingular branch on image 63 as reported yesterday may also  represent sequela of prior recannulated pulmonary embolus rather than  chronic recurrent pulmonary embolus          Impression:           There is no evidence of acute pulmonary embolus or of chronic recurrent  pulmonary embolus on scintigraphy    Review of the patient's prior CT does demonstrate a tiny filling defect  in the lobar branch of the left pulmonary artery as well as irregularity  of the lumen of the lingular branch of the left pulmonary artery which  may represent sequela of recannulation of prior pulmonary embolus  unfortunately we do not have prior CT scans of the chest with IV  contrast available to help [place] determine the acuity of these findings    Laurena Slimmer, MD   08/06/2019 11:49 AM    US Venous Low Extrem Duplx Dopp Comp Bilat [[id number]] Collected: 08/05/19 2141    Order Status: Completed Updated: 08/05/19 2144    Narrative:      BILATERAL LOWER EXTREMITY VENOUS DUPLEX ULTRASOUND    DATE OF SERVICE: 08/05/2019 20:57 PM.    HISTORY: 68 years-old Male with chest pain and chronic pulmonary  venous thromboembolic disease. Clinical concern for occult DVT. Venous  duplex evaluation is requested.    COMPARISON: CTA chest 08/05/2019.    TECHNIQUE: Bilateral lower extremity venous duplex  ultrasound was  performed utilizing grayscale, color and spectral Doppler, transverse  compression, and augmentation techniques.    FINDINGS: The bilateral deep calf and femoropopliteal veins are  patent with normal coaptation and no evidence of thrombosis. The  bilateral saphenofemoral junctions are patent. Normal respiratory  phasicity is present at the iliofemoral junctions bilaterally with no  evidence to suggest central venous obstruction.      Impression:        Negative for DVT or central venous obstruction of the bilateral lower  extremities.    Hulda Marin   08/05/2019 9:42 PM    CT Angiogram Chest [[id number]] Collected: 08/05/19 1942    Order Status: Completed Updated: 08/05/19 1951    Narrative:      CT ANGIOGRAM CHEST 08/05/2019 7:34 PM    Clinical history: ro pe    Comparison: CT of the chest from 04/27/2014      Technique: Axial images are obtained through the chest after  administration of intravenous contrast. Sagittal and coronal  reformations were made.      Contrast dose: Omnipaque 350,    The following ?dose reduction techniques were utilized: automated  exposure control and/or adjustment of the mA and/or kV according  to patient size, and the use of iterative reconstruction technique.    FINDINGS: Suggestion of chronic pulmonary emboli to the lingular branch  of the left pulmonary artery best seen on axial image #67, series #6 and  sagittal image #63, series #12. No evidence of a large central pulmonary  emboli. No evidence of aortic dissection. The cardiac size is within  normal limits. The esophagus imaging normal.    There is no significant mediastinal, hilar or axillary lymphadenopathy.    There is a solid nodule in the left lower lobe measuring 8 mm, stable  since prior examination from 04/27/2014. Dependent atelectasis at lung  bases. No infiltrate, effusion or consolidation.  Limited examination of  the upper abdomen is unremarkable. Degenerative changes in the spine.  There is a  spinal stimulator in place with the distal lead in the mid  thoracic spine level.      Impression:          Suggestion of chronic nonocclusive pulmonary emboli to the lingular  branch of the left pulmonary artery.    No evidence of a large central pulmonary emboli or aortic dissection.    Stable 8 mm solid nodule in the left lower lobe.           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  On full dose anticoagulation with Eliquis.  Likely will benefit from at least 24-month of anticoagulation and reassess the need prior to discontinuation.  Negative venous Doppler for DVT.  VQ scan without any other significant finding.    Stable for discharge from pulmonary standpoint    Signed by: Margarite Gouge, MD

## 2019-08-06 NOTE — Discharge Instr - AVS First Page (Signed)
Reason for your Hospital Admission:  ***      Instructions for after your discharge:  ***

## 2019-08-06 NOTE — UM Notes (Signed)
Requesting final auth to be faxed to [phone] and additional information can be requested by calling   [phone]     Auth Number :  npr     Discharge Date -  11.12. 2020   ER ADMIT DATE AND TIME: 08/03/2019  9:27 AM  OBS admit: 08/03/19 1259  IP admit: 08/05/19 2022     NAME: Rogelio Maloy Falzone             MR#: [id number]        Patient Address:  [address]  [place] [address]     Patient phone: [phone] (home)      PATIENT NAME: DAEVION, STEFFAN  DOB: [birth year]   PMH:  has a past medical history of Arrhythmia, Arthritis, Back pain, Blood disorder (11/2014), Diabetes mellitus (2016), Difficulty in walking(719.7), Elevated liver function tests, Failed back syndrome, Fatty liver, Hemochromatosis, Hemochromatosis (09/2014), High cholesterol, Hypertensive disorder (06/2016), Low back pain, Lung nodule (2012), Neuropathy, Psoriasis, Psoriatic arthritis, S/P insertion of spinal cord stimulator (2015), Sleep apnea, and Type 2 diabetes mellitus, controlled.  PSH:  has a past surgical history that includes Lumbar fusion (10/2011); Microdiscectomy lumbar (06/2011, 07/2011); Knee arthrocentesis (1983); Abdominal surgery (03/1971); CATARACT EXT.WITH IOL (Bilateral, 2009); Fracture surgery (1993); INSERTION, SPINAL CORD STIMULATOR GENERATOR (N/A, 05/07/2014); Septoplasty (10/05/2015); Skin biopsy (11/2015); ARTHROSCOPY, KNEE (Right, 03/21/2016); and ARTHROPLASTY, KNEE, TOTAL (Right, 08/20/2016).        DIAGNOSIS:     ICD-10-CM    1. SOB (shortness of breath)  R06.02    2. Chronic pulmonary embolism  I27.82

## 2019-08-07 ENCOUNTER — Telehealth (INDEPENDENT_AMBULATORY_CARE_PROVIDER_SITE_OTHER): Payer: Self-pay

## 2019-08-07 NOTE — Telephone Encounter (Addendum)
Hospital D/C Template    Chart Review:     Type of Encounter  - Inpatient   Facility: [facility]  Discharge Date: 08/06/19  Primary Discharge Dx:  Dyspnea on exertion  Chronic pulmonary venous thromboembolic disease causing dyspnea on exertion  Hypertension  DM2  Hemochromatosis well-controlled  Solitary pulmonary nodule, stable  Hyperlipidemia/hypertriglyceridemia  Prior ER Visits/Hospitalizations (past year): ER x 2, hosp admit x 1  Follow Up Appt with PCP/Specialist:  PCP [facility] fuv- Video visit on 11/16, and also needs FUV with Pulmonary  One week and Cardiology 2 weeks  Home Care/Home Health Ordered? No    TC for hospital follow-up, LVM for pt regarding current status and FUV for ongoing care, noted to have PCP video FUV scheduled for 11/16, not reached for hospital phone follow-up call, had FUV scheduled with PCP.     If there is anything that you need, please give your office a call at [place], [phone].       *Instructed patient to call back if symptoms change or worsen and/or go to the emergency room or call 911 for emergency symptoms*

## 2019-08-07 NOTE — Progress Notes (Signed)
Pt is admitted with hypertension and being discharged to home.  Pt has appt made for follow up:  Dr Erasmo Score  Nov 16th at 11:45  AVS also outlines the following needed follow ups with:  Dr Day and Dr Herscowitz  DCP: Home w/ pt to arrange for transport     08/07/19 1154   Discharge Disposition   Patient preference/choice provided? Yes   Physical Discharge Disposition Home   Mode of Transportation Car   Patient/Family/POA notified of transfer plan Yes   Patient agreeable to discharge plan/expected d/c date? Yes   Family/POA agreeable to discharge plan/expected d/c date? Yes   Bedside nurse notified of transport plan? Yes   CM Interventions   Follow up appointment scheduled? Yes   Referral made for home health RN visit?   (Appt Made w/ Dr Erasmo Score PH# Nov 16 at 11:45; pt also to follow up with Dr Day and Dr Romie Jumper)   Multidisciplinary rounds/family meeting before d/c? No    Dekota Kirlin T Vong Garringer,MSW  [phone]

## 2019-08-10 ENCOUNTER — Encounter (INDEPENDENT_AMBULATORY_CARE_PROVIDER_SITE_OTHER): Payer: Self-pay | Admitting: Student in an Organized Health Care Education/Training Program

## 2019-08-10 ENCOUNTER — Telehealth (INDEPENDENT_AMBULATORY_CARE_PROVIDER_SITE_OTHER): Payer: Medicare Other | Admitting: Student in an Organized Health Care Education/Training Program

## 2019-08-10 DIAGNOSIS — I2782 Chronic pulmonary embolism: Secondary | ICD-10-CM

## 2019-08-10 DIAGNOSIS — R0609 Other forms of dyspnea: Secondary | ICD-10-CM

## 2019-08-10 DIAGNOSIS — R0600 Dyspnea, unspecified: Secondary | ICD-10-CM

## 2019-08-10 NOTE — Progress Notes (Signed)
Have you seen any specialists/other providers since your last visit with [place]?    No    Arm preference verified?   No  The patient is due for eye exam, foot exam, spirometry, shingles vaccine, pneumonia vaccine and [facility], [facility]

## 2019-08-10 NOTE — Progress Notes (Addendum)
Subjective:      Patient ID: Kent Jordan is a 68 y.o. male.    Verbal consent has been obtained from the patient to conduct a video visit encounter to minimize exposure to COVID-19:  Yes      Chief Complaint:  Chief Complaint   Patient presents with    Hospital Follow-up    Acute dyspnea on exertion       HPI   Here for follow-up from recent hospitalization.   Inpatient facility: [facility]  Date of admission: November 9  Date of discharge: November 12  Discharge diagnosis: DOE, ?chronic PE  Hospital course: Per Junior summary  " 21 68 year old male presenting with 1 month history of progressive dyspnea on exertion.  Patient underwent echo showing preserved EF.  Underwent CTA chest showing chronic nonocclusive PE.  Lower extremity Dopplers and VQ scan showed no acute issues.  Patient was started on Eliquis; pulmonary recommended 38-month therapy.  Patient was discharged in improved condition without evidence of hypoxia with ambulation"    08/10/2019 - continue DOE, not worse, to see pulmonlogist, compliant with Eliquis, no abnormal bleeding, has f/u with cardiology, no fever/cough, sees hematologist Dr. Bing Matter regularly for hemochromatosis.     Prior hospitalization within past 30 days: No   Prior hospitalization within past 6 months: No  Comorbid conditions: HTN, T2D, HLD, hemochromatosis  Medical procedure(s) performed: oral anticoagulation  Surgical procedure(s) performed: none  Diagnostic tests performed: chest x-ray, CT chest, echocardiogram, venous dopplers and V/Q scan  Pending tests: none  Home care: ADL's intact  Specialist follow-up: cardiology, hematology and pulmonology  Post-hospital monitoring: none  Mobility status: normal mobility     Medication Reconciliation(required):  Hospital discharge medications reviewed and reconciled with current medication list. Patient is adherent with hospital discharge medication regimen.    Advanced Directives:  Not addressed today.    Problem  List:  Patient Active Problem List   Diagnosis    Mixed hyperlipidemia    Essential hypertension    Bigeminal rhythm    Arrhythmia    Primary osteoarthritis of right knee    Status post right knee replacement    Type 2 diabetes mellitus without complication, without long-term current use of insulin    Other hemochromatosis    Acute bilateral low back pain with sciatica, sciatica laterality unspecified    Anterior knee pain, right    Overweight (BMI 25.0-29.9)    Elevated LFTs    Psoriatic arthritis    SOB (shortness of breath)    Dyspnea on exertion    Chest tightness    Chronic pulmonary embolism       Current Medications:  Current Outpatient Medications   Medication Sig Dispense Refill    amLODIPine (NORVASC) 10 MG tablet Take 10 mg by mouth daily         [START ON 09/05/2019] apixaban (ELIQUIS) 5 MG Take 1 tablet (5 mg total) by mouth 2 (two) times daily 60 tablet 1    Apixaban Starter Pack (Eliquis DVT/PE Starter Pack) 5 MG Tablet Therapy Pack Take 10 mg by mouth 2 (two) times daily for 7 days, THEN 5 mg 2 (two) times daily for 23 days. 1 each 0    celecoxib (CeleBREX) 200 MG capsule Take 200 mg by mouth 2 (two) times daily      DULoxetine (CYMBALTA) 60 MG capsule Take 60 mg by mouth daily.      folic acid (FOLVITE) 400 MCG tablet Take 400 mcg by mouth daily.  hydroCHLOROthiazide (HYDRODIURIL) 25 MG tablet Take 25 mg by mouth daily.         metFORMIN (GLUCOPHAGE-XR) 500 MG 24 hr tablet TAKE 1 TABLET (500 MG TOTAL) BY MOUTH EVERY MORNING WITH BREAKFAST 90 tablet 0    metoprolol succinate XL (TOPROL-XL) 25 MG 24 hr tablet Take 25 mg by mouth daily     3    MILK THISTLE PO Take by mouth      Omega-3 Fatty Acids (Omega-3 Fish Oil) 500 MG Cap Take 1,400 mg by mouth daily         ondansetron (ZOFRAN-ODT) 4 MG disintegrating tablet Take 1 tablet (4 mg total) by mouth every 6 (six) hours as needed for Nausea 8 tablet 0    pantoprazole (PROTONIX) 20 MG tablet Take 1 tablet (20 mg total)  by mouth daily 60 tablet 1    rosuvastatin (CRESTOR) 5 MG tablet Take 1 tablet (5 mg total) by mouth daily 90 tablet 1    Secukinumab, 300 MG Dose, (Cosentyx, 300 MG Dose,) 150 MG/ML Solution Prefilled Syringe Inject into the skin      traZODone (DESYREL) 50 MG tablet Take 50 mg by mouth nightly as needed.           No current facility-administered medications for this visit.        Allergies:  Allergies   Allergen Reactions    Angiotensin Receptor Blockers Other (See Comments)     ARB combined with Celebrex caused Arrhythmia due to high K+    Ciprofloxacin Swelling and Anaphylaxis     swelling    Ace Inhibitors Angioedema    Gabapentin Other (See Comments)     blurred vision, dizziness       Past Medical History:  Past Medical History:   Diagnosis Date    Arrhythmia     when on ARB    Arthritis     psoriatic and osteoarthritis     Back pain     Blood disorder 11/2014    Hemochromatosis    Diabetes mellitus 2016    started metformin 05/2016 d/t A1C 7.1 - now checking BS 2x/day - average 70-110 in the last week - A1C on 06/29/16 = 6.3 , has also lost 16 lbs since 05/2016    Difficulty in walking(719.7)     Uses cane to ambulate    Elevated liver function tests     had abd u/s 07/05/16     Failed back syndrome     Fatty liver     Hemochromatosis     phlebotomy every 3 months - last done 02/2016    Hemochromatosis 09/2014    High cholesterol     Hypertensive disorder 06/2016    norvasc recently increased to 10 mg daily - now controlled on meds - most recent BP 117/73    Low back pain     Lung nodule 2012    benign - was monitored x  3 yrs by Dr. Bernita Raisin (Pulm) - signed off from pulmonology - no further f/u required since 2015    Neuropathy     Tingling left foot, numbness left toes    Psoriasis     Psoriatic arthritis     tx w/ enbrel    S/P insertion of spinal cord stimulator 2015    Sleep apnea     mild - does not have CPAP - was unable to tolerate, less snoring since turboplasty    Type 2  diabetes mellitus, controlled  Past Surgical History:  Past Surgical History:   Procedure Laterality Date    ABDOMINAL SURGERY  03/1971    double hernia repair    ARTHROPLASTY, KNEE, TOTAL Right 08/20/2016    Procedure: ARTHROPLASTY, KNEE, TOTAL;  Surgeon: Harriet Masson, MD;  Location: [facility];  Service: Orthopedics;  Laterality: Right;  RIGHT TOTAL KNEE REPLACEMENT    ARTHROSCOPY, KNEE Right 03/21/2016    Procedure: ARTHROSCOPY, KNEE, RIGHT, PARTIAL MEDIAL MENISCECTOMY, PARTIAL SYNOVECTOMY;  Surgeon: Verner Chol, MD;  Location: [facility];  Service: Orthopedics;  Laterality: Right;    CATARACT EXT.WITH IOL Bilateral 2009    FRACTURE SURGERY  1993    right middle finger    INSERTION, SPINAL CORD STIMULATOR GENERATOR N/A 05/07/2014    Procedure: DORSAL COLUMN STIMULATOR PLACEMENT;  Surgeon: Tera Helper, MD;  Location: [facility];  Service: Neurosurgery;  Laterality: N/A;  DCS PLACEMENT    KNEE ARTHROCENTESIS  1983    right    LUMBAR FUSION  10/2011    MICRODISCECTOMY LUMBAR  06/2011, 07/2011    SEPTOPLASTY  10/05/2015    SKIN BIOPSY  11/2015    Negative       Family History:  Family History   Problem Relation Age of Onset    Heart disease Mother     Heart disease Father        Social History:  Social History     Tobacco Use    Smoking status: Former Smoker     Packs/day: 1.00     Years: 41.00     Pack years: 41.00     Quit date: 09/26/2011     Years since quitting: 7.8    Smokeless tobacco: Never Used   Substance Use Topics    Alcohol use: Yes     Alcohol/week: 2.0 standard drinks     Types: 4 Cans of beer per week    Drug use: No        The following sections were reviewed this encounter by the provider:   Tobacco   Allergies   Meds   Problems   Med Hx   Surg Hx   Fam Hx           ROS:  Review of Systems   Constitutional: Negative for appetite change, chills, fatigue, fever and unexpected weight change.   HENT: Negative for congestion, rhinorrhea and sore throat.    Eyes:  Negative for visual disturbance.   Respiratory: Positive for shortness of breath (On exertion). Negative for cough.    Cardiovascular: Negative for chest pain and palpitations.   Gastrointestinal: Negative for abdominal pain and blood in stool.   Endocrine: Negative for cold intolerance and heat intolerance.   Genitourinary: Negative for difficulty urinating.   Musculoskeletal: Negative for arthralgias.   Neurological: Negative for dizziness, weakness, numbness and headaches.   Hematological: Negative for adenopathy.   Psychiatric/Behavioral: Negative for dysphoric mood. The patient is not nervous/anxious.    All other systems reviewed and are negative.      Vitals:  There were no vitals taken for this visit.    Objective:     Today's exam due to video visit     Physical Exam:  Physical Exam  Constitutional:       Appearance: Normal appearance.   HENT:      Head: Normocephalic and atraumatic.   Pulmonary:      Effort: Pulmonary effort is normal.   Musculoskeletal: Normal range of motion.   Neurological:  General: No focal deficit present.      Mental Status: He is alert.   Psychiatric:         Mood and Affect: Mood normal.         Assessment:     1. DOE (dyspnea on exertion)  - #2 contributing, cont AC, see pulm/card/heme/onc, incentive spirometry  - Ambulatory referral to Pulmonology    2. Other chronic pulmonary embolism, unspecified whether acute cor pulmonale present  - unclear etiology, see pulm/heme  - Ambulatory referral to Pulmonology      Plan:   Disease/Illness Education:  Patient educated regarding symptom management, adherence to current medication regimen and current plan of care. Patient encouraged to contact us with any questions or needs.  Patient agreeable to current plan of care and verbalizes understanding of information provided.  Home Health Needs:  No current home health services needed at this time.  Community Service Needs:  No community services needed at this time.  Establishment of  Referral Orders:  New referrals entered into EMR - see orders section  Communication with Health Care Providers:  No active communication with additional health care providers needed at this time.  [place] 90+ day Treatment Plan:  Not applicable    Transitional Care Management Coding Criteria:  1) Communication with the patient and/or family was made within 2 business days of discharge.  2) Face-to-face visit occurred within 7 days of discharge.  3) Complexity of medical decision making is high.    Return in about 3 months (around 11/10/2019), or if symptoms worsen or fail to improve.    Erasmo Score, MD

## 2019-08-18 ENCOUNTER — Encounter (INDEPENDENT_AMBULATORY_CARE_PROVIDER_SITE_OTHER): Payer: Self-pay

## 2019-08-27 ENCOUNTER — Other Ambulatory Visit (INDEPENDENT_AMBULATORY_CARE_PROVIDER_SITE_OTHER): Payer: Self-pay | Admitting: Student in an Organized Health Care Education/Training Program

## 2019-08-27 DIAGNOSIS — E782 Mixed hyperlipidemia: Secondary | ICD-10-CM

## 2019-09-01 ENCOUNTER — Encounter (INDEPENDENT_AMBULATORY_CARE_PROVIDER_SITE_OTHER): Payer: Self-pay

## 2019-09-16 ENCOUNTER — Other Ambulatory Visit: Payer: Self-pay | Admitting: Internal Medicine

## 2019-09-21 ENCOUNTER — Ambulatory Visit
Admission: RE | Admit: 2019-09-21 | Discharge: 2019-09-21 | Disposition: A | Discharge status: Home / Self Care | Payer: Medicare Other | Source: Ambulatory Visit | Attending: Internal Medicine | Admitting: Internal Medicine

## 2019-09-21 DIAGNOSIS — Z7901 Long term (current) use of anticoagulants: Secondary | ICD-10-CM | POA: Insufficient documentation

## 2019-09-21 DIAGNOSIS — Z86711 Personal history of pulmonary embolism: Secondary | ICD-10-CM | POA: Insufficient documentation

## 2019-09-21 MED ORDER — IOHEXOL 350 MG/ML IV SOLN
100.00 mL | Freq: Once | INTRAVENOUS | Status: AC | PRN
Start: 2019-09-21 — End: 2019-09-21
  Administered 2019-09-21: 10:00:00 100 mL via INTRAVENOUS

## 2019-09-28 ENCOUNTER — Other Ambulatory Visit (INDEPENDENT_AMBULATORY_CARE_PROVIDER_SITE_OTHER): Payer: Self-pay | Admitting: Student in an Organized Health Care Education/Training Program

## 2019-09-28 DIAGNOSIS — E119 Type 2 diabetes mellitus without complications: Secondary | ICD-10-CM

## 2019-10-02 ENCOUNTER — Encounter (INDEPENDENT_AMBULATORY_CARE_PROVIDER_SITE_OTHER): Payer: Self-pay

## 2019-10-02 ENCOUNTER — Ambulatory Visit (INDEPENDENT_AMBULATORY_CARE_PROVIDER_SITE_OTHER): Payer: Self-pay | Admitting: Cardiovascular Disease

## 2019-10-06 ENCOUNTER — Other Ambulatory Visit (FREE_STANDING_LABORATORY_FACILITY): Payer: Medicare Other

## 2019-10-06 ENCOUNTER — Ambulatory Visit (INDEPENDENT_AMBULATORY_CARE_PROVIDER_SITE_OTHER): Payer: Medicare Other

## 2019-10-06 DIAGNOSIS — I10 Essential (primary) hypertension: Secondary | ICD-10-CM

## 2019-10-06 DIAGNOSIS — Z01818 Encounter for other preprocedural examination: Secondary | ICD-10-CM

## 2019-10-06 DIAGNOSIS — Z1159 Encounter for screening for other viral diseases: Secondary | ICD-10-CM

## 2019-10-06 LAB — CBC AND DIFFERENTIAL
Absolute NRBC: 0 10*3/uL (ref 0.00–0.00)
Basophils Absolute Automated: 0.05 10*3/uL (ref 0.00–0.08)
Basophils Automated: 0.8 %
Eosinophils Absolute Automated: 0.12 10*3/uL (ref 0.00–0.44)
Eosinophils Automated: 1.9 %
Hematocrit: 42.2 % (ref 37.6–49.6)
Hgb: 13.4 g/dL (ref 12.5–17.1)
Immature Granulocytes Absolute: 0.03 10*3/uL (ref 0.00–0.07)
Immature Granulocytes: 0.5 %
Lymphocytes Absolute Automated: 1.89 10*3/uL (ref 0.42–3.22)
Lymphocytes Automated: 30.3 %
MCH: 26.9 pg (ref 25.1–33.5)
MCHC: 31.8 g/dL (ref 31.5–35.8)
MCV: 84.6 fL (ref 78.0–96.0)
MPV: 10.5 fL (ref 8.9–12.5)
Monocytes Absolute Automated: 0.63 10*3/uL (ref 0.21–0.85)
Monocytes: 10.1 %
Neutrophils Absolute: 3.52 10*3/uL (ref 1.10–6.33)
Neutrophils: 56.4 %
Nucleated RBC: 0 /100 WBC (ref 0.0–0.0)
Platelets: 294 10*3/uL (ref 142–346)
RBC: 4.99 10*6/uL (ref 4.20–5.90)
RDW: 13 % (ref 11–15)
WBC: 6.24 10*3/uL (ref 3.10–9.50)

## 2019-10-06 LAB — BASIC METABOLIC PANEL
Anion Gap: 12 (ref 5.0–15.0)
BUN: 16 mg/dL (ref 9.0–28.0)
CO2: 24 mEq/L (ref 21–29)
Calcium: 9 mg/dL (ref 8.5–10.5)
Chloride: 98 mEq/L — ABNORMAL LOW (ref 100–111)
Creatinine: 1.1 mg/dL (ref 0.5–1.5)
Glucose: 409 mg/dL — ABNORMAL HIGH (ref 70–100)
Potassium: 4.5 mEq/L (ref 3.5–5.1)
Sodium: 134 mEq/L — ABNORMAL LOW (ref 136–145)

## 2019-10-06 LAB — GFR: EGFR: >60

## 2019-10-06 LAB — HEMOLYSIS INDEX: Hemolysis Index: 3 (ref 0–18)

## 2019-10-07 LAB — COVID-19 (SARS-COV-2): SARS CoV 2 Overall Result: NOT DETECTED

## 2019-10-08 ENCOUNTER — Other Ambulatory Visit (INDEPENDENT_AMBULATORY_CARE_PROVIDER_SITE_OTHER): Payer: Self-pay | Admitting: Student in an Organized Health Care Education/Training Program

## 2019-10-08 NOTE — Telephone Encounter (Signed)
Name, strength, directions of requested refill(s):    apixaban (ELIQUIS) 5 MG  pantoprazole (PROTONIX) 20 MG tablet    Pharmacy to send refill to or patient to pick up rx from office (mark requested pharmacy in BOLD):      [facility] [address] [place] - [address]  [address]  [place] [address]  Phone: [phone] Fax: [phone]    [id number] - [place], [place] - [address]  [address]  [place] [address]  Phone: [phone] Fax: [phone]        Please mark "X" next to the preferred call back number:    Mobile:   Telephone Information:   Mobile [phone]    x   Home: @HOMEPHONE @    Work: @WORKPHONE @          Next visit: Visit date not found

## 2019-10-09 ENCOUNTER — Other Ambulatory Visit: Payer: Self-pay

## 2019-10-09 ENCOUNTER — Ambulatory Visit
Admission: RE | Admit: 2019-10-09 | Discharge: 2019-10-09 | Disposition: A | Discharge status: Home / Self Care | Payer: Medicare Other | Source: Ambulatory Visit | Attending: Cardiovascular Disease | Admitting: Cardiovascular Disease

## 2019-10-09 ENCOUNTER — Encounter
Admission: RE | Disposition: A | Discharge status: Home / Self Care | Payer: Self-pay | Source: Ambulatory Visit | Attending: Cardiovascular Disease

## 2019-10-09 DIAGNOSIS — I2699 Other pulmonary embolism without acute cor pulmonale: Secondary | ICD-10-CM | POA: Insufficient documentation

## 2019-10-09 DIAGNOSIS — R0789 Other chest pain: Secondary | ICD-10-CM

## 2019-10-09 DIAGNOSIS — Z7902 Long term (current) use of antithrombotics/antiplatelets: Secondary | ICD-10-CM | POA: Insufficient documentation

## 2019-10-09 DIAGNOSIS — R0602 Shortness of breath: Secondary | ICD-10-CM | POA: Diagnosis present

## 2019-10-09 DIAGNOSIS — R071 Chest pain on breathing: Secondary | ICD-10-CM | POA: Diagnosis present

## 2019-10-09 DIAGNOSIS — Z86711 Personal history of pulmonary embolism: Secondary | ICD-10-CM | POA: Insufficient documentation

## 2019-10-09 DIAGNOSIS — R0609 Other forms of dyspnea: Secondary | ICD-10-CM | POA: Insufficient documentation

## 2019-10-09 DIAGNOSIS — Z955 Presence of coronary angioplasty implant and graft: Secondary | ICD-10-CM

## 2019-10-09 DIAGNOSIS — Z7901 Long term (current) use of anticoagulants: Secondary | ICD-10-CM | POA: Insufficient documentation

## 2019-10-09 DIAGNOSIS — I2510 Atherosclerotic heart disease of native coronary artery without angina pectoris: Secondary | ICD-10-CM | POA: Insufficient documentation

## 2019-10-09 HISTORY — PX: RIGHT & LEFT HEART CATH: CATH5

## 2019-10-09 LAB — I-STAT ACT KAOLIN
i-STAT ACT Kaolin: 219 s — ABNORMAL HIGH (ref 90–149)
i-STAT ACT Kaolin: 312 s — ABNORMAL HIGH (ref 90–149)

## 2019-10-09 SURGERY — RIGHT & LEFT HEART CATH

## 2019-10-09 MED ORDER — MIDAZOLAM HCL 1 MG/ML IJ SOLN (WRAP)
INTRAMUSCULAR | Status: AC
Start: 2019-10-09 — End: ?
  Filled 2019-10-09: qty 2

## 2019-10-09 MED ORDER — VERAPAMIL HCL 2.5 MG/ML IV SOLN
INTRAVENOUS | Status: AC
Start: 2019-10-09 — End: ?
  Filled 2019-10-09: qty 2

## 2019-10-09 MED ORDER — FENTANYL CITRATE (PF) 50 MCG/ML IJ SOLN (WRAP)
INTRAMUSCULAR | Status: AC
Start: 2019-10-09 — End: ?
  Filled 2019-10-09: qty 2

## 2019-10-09 MED ORDER — TICAGRELOR 90 MG PO TABS
ORAL_TABLET | ORAL | Status: AC
Start: 2019-10-09 — End: ?
  Filled 2019-10-09: qty 1

## 2019-10-09 MED ORDER — HEPARIN SODIUM (PORCINE) 1000 UNIT/ML IJ SOLN
INTRAMUSCULAR | Status: AC
Start: 2019-10-09 — End: ?
  Filled 2019-10-09: qty 10

## 2019-10-09 MED ORDER — NITROGLYCERIN IN D5W 200-5 MCG/ML-% IV SOLN VIAL
INTRAVENOUS | Status: AC | PRN
Start: 2019-10-09 — End: 2019-10-09
  Administered 2019-10-09: 200 ug via INTRA_ARTERIAL

## 2019-10-09 MED ORDER — TICAGRELOR 90 MG PO TABS
ORAL_TABLET | ORAL | Status: AC | PRN
Start: 2019-10-09 — End: 2019-10-09
  Administered 2019-10-09: 180 mg via ORAL

## 2019-10-09 MED ORDER — HEPARIN SODIUM (PORCINE) 1000 UNIT/ML IJ SOLN
INTRAMUSCULAR | Status: AC | PRN
Start: 2019-10-09 — End: 2019-10-09
  Administered 2019-10-09: 4500 mL via INTRAVENOUS

## 2019-10-09 MED ORDER — SODIUM CHLORIDE 0.9 % IV SOLN
INTRAVENOUS | Status: AC
Start: 2019-10-09 — End: 2019-10-09

## 2019-10-09 MED ORDER — FENTANYL CITRATE (PF) 50 MCG/ML IJ SOLN (WRAP)
INTRAMUSCULAR | Status: AC | PRN
Start: 2019-10-09 — End: 2019-10-09
  Administered 2019-10-09: 50 ug via INTRAVENOUS

## 2019-10-09 MED ORDER — LIDOCAINE HCL 1 % IJ SOLN
INTRAMUSCULAR | Status: AC | PRN
Start: 2019-10-09 — End: 2019-10-09
  Administered 2019-10-09: 1 mL

## 2019-10-09 MED ORDER — HEPARIN SODIUM (PORCINE) 1000 UNIT/ML IJ SOLN
INTRAMUSCULAR | Status: AC | PRN
Start: 2019-10-09 — End: 2019-10-09
  Administered 2019-10-09: 2000 [IU] via INTRAVENOUS

## 2019-10-09 MED ORDER — IODIXANOL 320 MG/ML IV SOLN
200.00 mL | Freq: Once | INTRAVENOUS | Status: DC | PRN
Start: 2019-10-09 — End: 2019-10-09

## 2019-10-09 MED ORDER — MIDAZOLAM HCL 1 MG/ML IJ SOLN (WRAP)
INTRAMUSCULAR | Status: AC | PRN
Start: 2019-10-09 — End: 2019-10-09
  Administered 2019-10-09: 2 mg via INTRAVENOUS

## 2019-10-09 MED ORDER — HEPARIN SODIUM (PORCINE) 1000 UNIT/ML IJ SOLN
INTRAMUSCULAR | Status: AC | PRN
Start: 2019-10-09 — End: 2019-10-09
  Administered 2019-10-09: 5000 [IU] via INTRAVENOUS

## 2019-10-09 MED ORDER — LIDOCAINE HCL 1 % IJ SOLN
INTRAMUSCULAR | Status: AC | PRN
Start: 2019-10-09 — End: 2019-10-09
  Administered 2019-10-09: 5 mL

## 2019-10-09 MED ORDER — SODIUM CHLORIDE 0.9 % IV SOLN
INTRAVENOUS | Status: DC
Start: 2019-10-09 — End: 2019-10-09

## 2019-10-09 MED ORDER — NITROGLYCERIN IN D5W 200-5 MCG/ML-% IV SOLN VIAL
INTRAVENOUS | Status: AC
Start: 2019-10-09 — End: ?
  Filled 2019-10-09: qty 10

## 2019-10-09 MED ORDER — HEPARIN (PORCINE) IN NACL 2-0.9 UNIT/ML-% IJ SOLN (WRAP)
INTRAVENOUS | Status: AC
Start: 2019-10-09 — End: ?
  Filled 2019-10-09: qty 1500

## 2019-10-09 MED ORDER — CLOPIDOGREL BISULFATE 75 MG PO TABS
75.0000 mg | ORAL_TABLET | Freq: Every day | ORAL | 3 refills | Status: AC
Start: 2019-10-10 — End: ?

## 2019-10-09 MED ORDER — LIDOCAINE HCL 1 % IJ SOLN
INTRAMUSCULAR | Status: AC
Start: 2019-10-09 — End: ?
  Filled 2019-10-09: qty 20

## 2019-10-09 NOTE — Progress Notes (Signed)
[facility] to [facility] for left and right heart cath.  See pre procedure screening tool in EPIC for further data

## 2019-10-09 NOTE — Procedures (Addendum)
PRELIMINARY CATH NOTE  [facility]    Date Time: 10/09/19 11:24 AM    Patient Name:   Kent Jordan    Date of Cardiac Cath:   10/09/2019    Providers Performing:   Surgeon(s):  Symphani Eckstrom, Bertram Millard, MD      Operative Procedure:   LHC  PTCA/drug stent X 2 LCX  Aortic valve not crossed - no VGRAM (have recent office echo)    Preoperative Diagnosis:   Progressive dyspnea on exertion, suspected angina  Recent (small) PE.  Not felt enough to cause dyspnea symptoms  Normal estimated PA pressures post PE (by echo)    Postoperative Diagnosis:   1V CAD  PTCA/drug stent X 2 (Guidezilla) LCX  Aortic valve not crossed - no VGRAM (have recent echo  Radial band    Anesthesia:   Versed, fentanyl    Stents   * No implants in log *      Findings:   Plan was RHC and LHC, possible PCI (dyspnea on exertion, recent PE).  R AC IV not flushing.  Wire would not pass.  Left in place.    1% lido  6FR R rad  IA NTG  IV heparin  5FR TIG    LM 10 with mild CA++  LAD 10  LCX focal mid 90, mild CA++, TIMI 3 flow  RCA 10  No collaterals  TIMI 3 all vessels  R dominant anatomy    Additional IV heparin.  Brilinta load    6FR JL3.5 guide  .014 MARVEL wire  2.5X12 balloon would not resolve lesion  3.0X12 WOLVERINE would not cross  3.0X12 NC balloon resolves waist  Unable to advance 3.0X16 SYNERGY  Guidezilla  3.0X16 RBP.  3.0X12 NC RBP  Unable to advance a second 3.0X8 stent to extend distally  2.5X8 advances.  Deployed at high pressure.  Overlap dilated using stent balloon to RBP.      Final result, widely patent stent site.  No dissection, TIMI 3 flow.    No attempt made to cross the valve.  We have recent echo.      No attempt made to obtain additional venous access for RHC (given CAD found as a presumed cause of DOE).    IMPRESSIONS:  1.  Recent progressive, now low level dyspnea on exertion.  2.  PE but small.  Eliquis.  Not felt to account for degree of dyspnea on exertion.  3.  Normal LVEF, normal valves, normal estimated PA pressure on recent  echo.  4.  Cath today with high grade LCX disease as a presumed cause of his symptoms.  5.  Successful PTCA/drug stent LCX.  Theron Arista.    6.  Aortic valve not crossed.  7.  RHC not done.    Complications:   none    Plan:   1.  IVF, 4 hours.  2.  Radial band 4 hours.  3.  Monitor.  4.  Same Brayant Dorr discharge (later today).  5.  Switch from brilinta (rapid onset for cath today) to plavix on discharge.  Will send rx.  6.  Continue (resume) eliquis.  7.  No ASA from here (avoid triple therapy).  8.  Avoid celebrex if/as able (does have severe back issues).    9.  Add over the counter protonix (stomach protection as he is on plavix and eliquis and may still need celebrex to function at times).    9.  Office f/u with me in 1-2 weeks (office or  televideo).  F/u for symptom resolution.  10.  Phase 1 and phase 2 cardiac rehab.          Signed by: Bertram Millard Svea Pusch, MD                                                                              [facility]        [facility]  NP Spectralink [phone] (8am-5pm)  MD Spectralink [phone] or [phone] (8am-5pm)  Arrhythmia Spectralink [phone] (8am-4:30pm)  After hours, non urgent consult line [phone]  After Hours, urgent consults [phone]

## 2019-10-09 NOTE — Discharge Instructions (Signed)
[facility]  Cardiovascular Interventional Radiology Discharge Instructions     Same Day Discharge Radial Cardiac Catheterization     Post procedure / Moderate sedation / Anesthesia  . Although you may be awake and alert in the admission recovery center a small amount of sedation remains in your system for 24 hours.  . You are advised to go directly home. If possible have someone stay with you for the next 24 hours.   . Do not drive a vehicle or operate heavy machinery for the next 48 hours.  . Do not consume alcohol, tranquilizers, sleeping medication or any other non-prescribed medication for the remainder of the day.  . Do not sign legal documents for 24 hours.   Post procedure nausea and vomiting   . Diet: begin with liquids and progress your diet as tolerated. Nausea may occur in the next 24 hours.  . If you develop nausea and vomiting after your procedure follow these steps: Stop all intake of food and liquid. Do not eat or drink anything until the nausea /vomiting subside and you start to feel better. Start your diet by eating ice chips or taking small sips of water. You may eat crackers or dry toast and or sips of clear liquids like ginger ale or apple juice. Advance your diet slowly. Avoid milk products, large, or fatty meals. If it does not improve please contact : _____________________  Site care and instructions   . Keep site clean and dry for the next 24 hours. You may shower the day after your procedure.  . Remove the dressing from site after 24 hours. You may apply a band aid. Do not apply lotions or powders to site   .  Do not tub bath or submerge the site in water for 3 days following procedure.  .  Do not operate heavy machinery i.e. lawn mower, or lift anything greater than a pound for the next 48 hours  . Utilize splint over next 48 hours as added reminder to not use arm. Check site / dressing frequently over next 24 hours   . Avoid excessive flexion/extension of wrist over the  next 48 hours   . If bleeding should occur: sit down and apply firm pressure to site with your fingers for 10-20 minutes.  . Bleeding ; If the bleeding stops continue to sit quietly for the next two hours keeping your wrist straight. Notify your physician   . If bleeding does not stop continue to hold pressure and call 911 immediately. Do not drive yourself  to the hospital   . Notify your physician should you have any change in sensation, color of your hand or tingling/tenderness   . Infection  ; Notify your physician should you have any signs of infection: redness , swelling , discharge from site or fever   Pain management   . If you have any pain or discomfort you may take over the counter medications like Tylenol or ibuprofen as allowed  . Reposition splint for comfort.   Medications / Diet               Your Doctor may have provided you with a prescription for anti platelet               Medication ( Brilinta, Plavix, or Effient )   . Resume your medications unless otherwise directed   . Resume previous diet   . If you are taking metformin please hold for 48 hours post procedure     Follow Up   . Discuss follow up plan / referral to cardiac rehab phase 2 with your MD  . Cardiac Rehab  brochure provided during consult  . Please call Cardiac Rehab to schedule an appointment   . [facility] [phone]     We are open from the hours of 7 am to 6 pm Monday through Friday. If you attempt to call us after hours with a question or concern and are unable to get a hold of us, or you feel the problem may be life threatening call 911 and go to the closest emergency room   I have received and understand these discharge instructions. I have no further questions regarding these instructions.     Patient or Representative   _____________________________________________________________________    Ticagrelor (By mouth)       BRILINTA  Ticagrelor (tye-KA-grel-or)  Helps prevent stroke, heart attack, and other  heart problems. This medicine is a blood thinner.  Brand Name(s):Brilinta  There may be other brand names for this medicine.  When This Medicine Should Not Be Used:  This medicine is not right for everyone. Do not use it if you had an allergic reaction to ticagrelor, or if you have bleeding problems (such as a bleeding stomach ulcer) or a history of bleeding in your brain.  How to Use This Medicine:  Tablet  . Your doctor will tell you how much medicine to use. Do not use more than directed. Take this medicine at the same time every day.   . Your doctor may tell you to take aspirin with this medicine. Do not use more than 100 milligrams of aspirin per day. Check the labels of other medicines to make sure they do not contain aspirin.   . If you cannot swallow the tablet, you may do this:   . Crush the tablet and mix it in a glass of water. Drink it right away. Rinse the glass with more water and drink that too, so you get all the medicine.   . You may give the tablet and water mixture through a nasogastric tube. Flush the tube with more water so you receive all the medicine.  . This medicine should come with a Medication Guide. Ask your pharmacist for a copy if you do not have one.   . Missed dose: Skip the missed dose and take your next dose as usual. Do not take extra medicine to make up for a missed dose.   . Store the medicine in a closed container at room temperature, away from heat, moisture, and direct light.   Drugs and Foods to Avoid:  Ask your doctor or pharmacist before using any other medicine, including over-the-counter medicines, vitamins, and herbal products.  . Some medicines can affect how ticagrelor works. Tell your doctor if you are using any of the following:  . Atazanavir, carbamazepine, clarithromycin, digoxin, indinavir, itraconazole, ketoconazole, lovastatin, nefazodone, nelfinavir, phenobarbital, phenytoin, rifampin, ritonavir, saquinavir, simvastatin, telithromycin, or voriconazole   . Blood  thinner (including warfarin or heparin)   . NSAID pain or arthritis medicine (including celecoxib, diclofenac, ibuprofen, naproxen)  Warnings While Using This Medicine:  . Tell your doctor if you are pregnant or breastfeeding, or if you have liver disease, heart rhythm problems (including slow heartbeat), lung or breathing problems (such as asthma or COPD), or a history of bleeding problems.   . This medicine may cause you to bleed and bruise more easily, and it may take longer than   usual for bleeding to stop. Be careful to avoid injuries.   . Do not stop using this medicine unless your doctor tells you to. To stop it may increase your risk of a heart attack, blood clot, or other serious problem.   . Tell any doctor or dentist who treats you that you are using this medicine. With your doctor's permission, you may need to stop using this medicine several days before you have surgery to reduce the risk of bleeding problems. Follow your doctor's instructions carefully.   . Your doctor will do lab tests at regular visits to check on the effects of this medicine. Keep all appointments.   . Keep all medicine out of the reach of children. Never share your medicine with anyone.   Possible Side Effects While Using This Medicine:  Call your doctor right away if you notice any of these side effects:  . Allergic reaction: Itching or hives, swelling in your face or hands, swelling or tingling in your mouth or throat, chest tightness, trouble breathing   . Bloody or black, tarry stools, red or dark brown urine   . Fast, slow, or pounding heartbeat   . Trouble breathing   . Unusual bleeding, bruising, or weakness   . Vomiting of blood or material that looks like coffee grounds  If you notice other side effects that you think are caused by this medicine, tell your doctor.  Call your doctor for medical advice about side effects. You may report side effects to FDA at   1-800-FDA-1088                 Cardiac Rehabilitation  Your Key  to Optimum Heart Health through Education and Exercise    [facility]'s Cardiac Rehabilitation program offers medically supervised exercise and education for adults with heart disease. Our cardiac rehabilitation program is a three-part program that begins in the hospital and continues on an outpatient basis. The program is offered at convenient locations throughout [place].  Who Can Participate?Anyone diagnosed with heart problems can participate. Heart problems may include heart attack (myocardial infarction), irregular heart rhythms, chronic angina, heart failure, cardiomyopathy and heart-valve disease. Anyone who has undergone coronary bypass surgery, heart transplant, heart valve repair/replacement, angioplasty, stenting, atherectomy or other heart procedures can also participate.  Your Key to Optimum Heart Health through Education and Exercise     You Can Help  As a not-for-profit organization, our commitment to world-class cardiac care is made possible through the generous support of donors in the community. For more information on how you can support [facility]'s commitment to providing world-class medical care, please call [facility] at [phone].    Cardiac Rehabilitation  Inpatient Transition Care (formerly Phase I)During hospitalization, a cardiac rehab staff member will meet with the patient and family to explain the cardiac rehabilitation program, the benefits of participation and the enrollment process. During this visit the staff member is available to answer questions about heart disease and initial activity guidelines.  SPP Secondary Prevention Program(formerly Phase II)Our staff will develop a personalized outpatient program, with a focus on monitored exercise and education. In this program, the patient will:   . Develop an increased understanding of their condition and treatment  . Receive support and guidance needed to make important lifestyle changes  that will decrease their risk for disease  . Improve cardiovascular conditioning for work and recreational activities  Cardiac Wellness Program(formerly Phase III)Our staff develops a maintenance program for those who   complete Phase II of the program and want to continue with supervised exercise. The program provides a supportive environment for a patient's continued wellness efforts.     What Are the Benefits of Cardiac Rehabilitation?   . Improved weight control. Lower blood pressure. Increased strength and endurance. Decreased cholesterol levels. Improved flexibility and balance. Diabetes management. Improved circulation. Better stress management. Improved self confidence. Expanded support network      Your Healthcare Team  [facility]'s team of experienced healthcare professionals -- certified cardiopulmonary clinicians, dietitians, pharmacists, exercise physiologists, rehabilitation therapists, respiratory therapists and specially-trained registered nurses -- provide a complete and personalized heart-disease management program to those enrolled in our program. The staff keeps your doctor informed of your progress through letters and phone calls.  Education  [facility] offers educational lectures and seminars to patients enrolled in the cardiac rehabilitation program. Educational offerings vary by location. Lectures and seminars may include such topics as Your Heart and How it Works; Exercise and Your Heart; Medication and Heart Health; Heart-Healthy Eating Guidelines; and Stress Management.      Fees and Insurance  Most insurance plans cover much of the cost of cardiac rehabilitation. The amount covered varies among insurance companies. Our staff will verify your benefits and what your insurance covers before your participation. You should also contact your insurance company to verify your level of coverage before you enroll in the program. Some insurance companies  may require pre-certification.  How Can I Enroll?  A doctor's referral is required to enroll in the program. We will contact your physician on your behalf to obtain your referral. If you are interested in learning more about our cardiac rehabilitation program, please call one of the convenient locations listed below.  Our Locations  [facility] offers cardiac rehabilitation at convenient locations throughout [place].   [facility], [place] [phone]   [facility] [place], [place] [phone]  [facility], [address], [place] [phone]  [facility], [place] [phone]        [facility]   CARDIOVASULAR INTERVENTIONAL RADIOLODY AND CARDIAC CATHETERIZATION LAB  We appreciate you trusting the [facility] team with your care today. This packet contains important information to ensure that your discharge home is safe and that your understand your post procedure needs to include; pain, bleeding, signs of infection and nausea in the setting of sedation or anesthesia.          Thank you for choosing [facility] CVIR Department for y our care. Should you have any comments or questions about your experience, please feel free to reach out to us.  reach out to [place].

## 2019-10-09 NOTE — Op Note (Signed)
PREPROCEDURE DIAGNOSES:  1.  Progressive exertional dyspnea, now with low level symptoms, on  outpatient medical therapy potentially for ischemia.  2.  Recent pulmonary embolus, on anticoagulation, felt to be small without  residual pulmonary hypertension, not felt to be sufficient to cause his  current symptoms.  3.  Recent echocardiogram through the office.     POSTPROCEDURE DIAGNOSES:  1.  High-grade left circumflex coronary artery disease.  2.  Successful angioplasty and drug stenting using a Guidezilla of the left  circumflex.  3.  Aortic valve not crossed as we have a recent ventriculogram as above.  4.  Radial band hemostasis.     TITLE OF PROCEDURES:  1.  Left heart catheterization.  2.  Coronary angiography.  3.  Angioplasty and drug stenting with Guidezilla, left circumflex.  4.  Aortic valve not crossed.  We have ejection fraction on recent echo.  5.  No right heart catheterization performed.  6.  Radial band hemostasis.     INDICATIONS FOR PROCEDURE:  [name] is a 69 year old gentleman with risk factors for coronary  artery disease.  He has approximately 6 months of progressive exertional  dyspnea.  He is now unable to actually walk his dog due to symptoms.  He is  on medical therapy for potential coronary disease and ischemia.  He was  subsequently diagnosed with a pulmonary embolus, but low clot burden and  echocardiogram afterwards without pulmonary hypertension.  Not felt  sufficient to cause his symptoms.  He had normal heart function and valves  on the echocardiogram.  He is therefore arranged for cardiac  catheterization, potentially to assess pulmonary pressures, as well as to  rule out obstructive coronary disease.     DESCRIPTION OF PROCEDURE:  After informed consent was obtained, the patient was prepped and draped in  the usual fashion.  Conscious sedation was provided with Versed and  fentanyl with good result.  An attempt was made to exchange a right  antecubital IV for a sheath.  There  was no return and the wire would not  feed.  We left it in place and moved to the right radial.  After local  lidocaine anesthesia, the right radial artery was accessed using a  single-wall anterior puncture, first stick.  Ultrasound not used.  A  6-French sheath placed without difficulty.  Intra-arterial nitroglycerin.   Intravenous heparin.  Of note, he is on oral anticoagulation as well as  aspirin as an outpatient.  His oral anticoagulation is with Eliquis, but  that has been held for this procedure.     Coronary angiography was performed using a 5-French TIG catheter advanced  over a J-wire.     CORONARY ANGIOGRAPHY:  Demonstrates high-grade single-vessel disease of the circumflex and a right  dominant anatomy as follows:  The left main coronary artery has 10% distal  disease with mild calcification.  The vessel bifurcates into the left  anterior descending and circumflex.  The circumflex has diffuse 10% disease  throughout its proximal to mid vessel.  In the mid vessel, though there is  a 90% tubular stenosis without angiographically evident substantial  calcium.  This was followed by a 60% to 70% stenosis just after.  The  vessel continues as an obtuse marginal, which is somewhat tortuous, but of  good caliber and distribution.  The left anterior descending has diffuse  10% disease.  The diagonal is present with diffuse 10% disease.  A second  diagonal is very small without significant  disease.  No collaterals.  The  right coronary artery is large with diffuse 10% plaque throughout.  It  bifurcates in a right posterior descending artery and right posterolateral  system distally.     The circumflex stenosis is felt to be the cause of his low level class III  to IV dyspnea symptoms, which have persisted despite anti-ischemic  medications.  It is therefore felt appropriate to treat this lesion for  symptom improvement.  He was given additional IV heparin.  He was given a  Brilinta load as an additional  antiplatelet agent.  The left main is then  engaged with a 6-French JL3.5 guide.  The stenosis was easily crossed with  a 0.014 Marvel wire positioned distally.  The area of stenosis is dilated  with a 2.5 x 12 balloon, which fails to resolve the lesion despite multiple  inflations.     The balloon was withdrawn.  An attempt was made to advance a cutting  balloon 3.0 x 12.  That would not cross the lesion.  That was withdrawn.  A  2.5 noncompliant balloon was advanced and dilated to high pressures,  resolving the lesion.  A 3.0 x 16 Synergy stent would not advance.  The  area of stenosis is then dilated with a 3.0 x 15 noncompliant balloon to  high pressures.  The 3.0 x 16 stent then advances and is positioned with  the area of initial stenosis and in the center.  We would have liked to  have advanced it a little more to pick up the moderate more distal lesion,  which we had dilated with some of the balloons.  The stent would not  advance further, so we deployed it in that location.  We then attempted to  advance a second 3.0 x 8 Synergy drug stent to extend the stent distally.   That would not advance.  We therefore advanced a GuideLiner Theron Arista).   We did that over a balloon, which we deployed distally at the more distal  lesion and used that to advance the stent.  The 3.0 x 8 stent still would  not advance, so a 2.5 x 8 stent advanced and was positioned overlapping the  more distal lesion in the distal end of the first stent.  The stent is  taken to high pressures.  The overlap is dilated to high pressures.     Final angiographic result is a widely patent stent site with the  overlapping stents with no dissection and brisk TIMI-3 antegrade flow.  Of  note, the patient had significant symptoms without EKG changes during each  of the balloon inflations resolving as the balloons come down.     No attempt was made to cross the aortic valve, as we had the recent  echocardiogram.  Similarly, no attempt was made to  obtain additional venous  access and do a right heart catheterization as we found presumably the  cause of his symptoms.     IMPRESSIONS:  1.  Six months' worth of progressive exertional dyspnea, now class III to  class IV, despite anti-ischemic medications.  2.  Recent pulmonary embolism, on Eliquis.  Normal pulmonary pressures by  echocardiogram post-pulmonary embolus.  The pulmonary embolus was low clot  burden and is not felt to be a cause of his current symptoms by pulmonary.  3.  Catheterization today  indeed shows high-grade circumflex coronary  disease, which is presumably the cause of his symptoms.  4.  Successful angioplasty and  drug stenting with 2 overlapping stents, mid  circumflex.  Guidezilla required.  5.  No attempt was made to cross the aortic valve, as we have a recent  echocardiogram for ejection fraction.  6.  No reattempt was made at right heart catheterization given the issues  with the access above, as we feel as though we identified and fixed  potential cause of his symptoms and we think the pulmonary pressures are  normal by recent echocardiogram.     PLAN:  The patient will be returned to the recovery area.  Monitored for 4 hours.   Radial band.  IV fluids.  Monitor.  Same Quinton Voth discharge protocol after that  if doing well.  He received Brilinta in lab and has been on aspirin, but I  will actually have him switch over to Plavix.  He should take a dose  tonight and then a dose tomorrow morning and then continue q.a.m. from  there.  Can resume his Eliquis tonight and continue going forward as well.   Stop aspirin.  Avoid routine use of nonsteroidals.  He is on rosuvastatin,  but at a low dose.  I will increase that to high intensity therapy at  office followup.  I will plan office followup in 1 to 2 weeks, either in  the office or by televideo.

## 2019-10-09 NOTE — Consults (Signed)
Cardiac Rehab Evaluation Note, MD Order    Patient meets criteria and provided explanation of cardiac rehabilitation, referral and brochure: Yes         Per CMS guidelines the following are eligible qualifying criteria for outpatient cardiac rehab:    - A heart attack in the last 12 months  - Coronary artery bypass surgery  - Current stable angina pectoris  - A heart valve repair or replacement  - A coronary angioplasty or coronary stent  - A heart or heart-lung transplant  - Stable chronic heart failure    - For CHF diagnosis the following additional criteria must be met:   - Left Ventricular ejection fraction <35%   - New York Heart Association (NYHA) class II to IV symptoms

## 2019-10-09 NOTE — Progress Notes (Addendum)
Discharge instructions provided to patient, patient verbalizes understanding, given opportunity to ask questions.  Questions answered.  Patient alert and oriented x 3, denies pain.  VSS, right radial site TR band, CDI. Cardiac rehab in to see patient.  Follow up with Dr. Hyacinth Meeker appointment set for 10/26/2019, 1500. Plan to [place] to home later today.      1622:  TR band removed per protocol, pt ambulated to restroom.      1658: PIV [place], [place] to home with wife.

## 2019-10-09 NOTE — H&P (Addendum)
H&P UPDATE WITH ASA/MALLAMPATTI    Date Time: 10/09/19 9:28 AM    PROCEDURE:    Left and right heart cath, possible percutaneous coronary intervention  INDICATIONS:    dyspnea  H&P:    The history and physical including past medical, family, and social history were reviewed   and there are no significant interval changes from what is currently available in the chart  from prior evaluation. He has no complaints.  He was seen and examined by me prior   to the procedure.     Patient of mine from office.  Progressive now limiting dyspnea.  Recent diagnosis of PE but small.  Pulmonary feels symptoms out of proportion to PE size/location.  PA pressure not elevated on noninvasive testing.  Plan is R and LHC.  R/o PA HTN.  R/o obstructive CAD.  Possible PCI.  Risks/benefits/alternatives reviewed.    ALLERGIES:    Angiotensin receptor blockers, Ciprofloxacin, Ace inhibitors, and Gabapentin   LABS:      Lab Results   Component Value Date    WBC 6.24 10/06/2019    HGB 13.4 10/06/2019    HCT 42.2 10/06/2019    PLT 294 10/06/2019    NA 134 (L) 10/06/2019    K 4.5 10/06/2019    CL 98 (L) 10/06/2019    CO2 24 10/06/2019    MG 2.0 08/06/2019    BUN 16.0 10/06/2019    CREAT 1.1 10/06/2019    EGFR >60.0 10/06/2019    GLU 409 (H) 10/06/2019     ASA PHYSICAL STATUS    Class 3 - Severe systemic disease, limits normal activity but not incapacitating  MALLAMPATTI AIRWAY CLASSIFICATION    Class III: Soft and hard palate and base of the uvula are visible  ACC BLEEDING RISK SCORE   Total Score: 25 = INTERMEDIATE RISK for bleeding (1.1%  to 3.1%)  PLANNED SEDATION:    ( ) NO SEDATION   (x) MODERATE SEDATION   ( ) DEEP SEDATION WITH ANESTHESIA   CONCLUSION:    The risks, benefits and alternatives of the procedure have been discussed in detail and   he has indicated that he understands the procedure, indications, and risks inherent to the   procedure and is amenable to proceeding.  All questions were answered. Informed   consent was signed and  verified.      Signed by: Kent Millard Cobey Raineri, MD

## 2019-10-13 ENCOUNTER — Encounter (INDEPENDENT_AMBULATORY_CARE_PROVIDER_SITE_OTHER): Payer: Self-pay

## 2019-10-13 NOTE — Telephone Encounter (Signed)
Last visit: 08/10/2019

## 2019-10-14 MED ORDER — APIXABAN 5 MG PO TABS
5.0000 mg | ORAL_TABLET | Freq: Two times a day (BID) | ORAL | 3 refills | Status: DC
Start: 2019-10-14 — End: 2019-12-01

## 2019-10-14 MED ORDER — PANTOPRAZOLE SODIUM 20 MG PO TBEC
20.00 mg | DELAYED_RELEASE_TABLET | Freq: Every day | ORAL | 1 refills | Status: AC
Start: 2019-10-14 — End: ?

## 2019-10-15 ENCOUNTER — Encounter: Payer: Self-pay | Admitting: Cardiovascular Disease

## 2019-10-21 ENCOUNTER — Encounter: Payer: Self-pay | Admitting: Cardiovascular Disease

## 2019-10-23 ENCOUNTER — Inpatient Hospital Stay
Admission: RE | Admit: 2019-10-23 | Discharge: 2019-10-23 | Disposition: A | Discharge status: Home / Self Care | Payer: Medicare Other | Source: Ambulatory Visit | Attending: Cardiovascular Disease | Admitting: Cardiovascular Disease

## 2019-10-23 DIAGNOSIS — Z955 Presence of coronary angioplasty implant and graft: Secondary | ICD-10-CM

## 2019-10-23 NOTE — Cardiac Rehab ITP (Signed)
Cardiac Rehab Individual Treatment Plan    Assessment Period  Phase:  Initial Assessment  Program:  Currently enrolled in Phase 2  Session #:  (phone interview )  First Exercise Session (Date):  10/30/19  Referring Diagnosis:  Stent(DES X 2 to LCx 10/09/2019)  Date of Event:  10/09/19  Prior Cardiac Hx:  Other(HTN, HLD, )  Risk Stratification:  Moderate risk  Ejection Fraction:  55-60%  Comment:  Pt hx of arithritis in the left knee, right hand, back, and neck.  Pt has fallen 3-4 times within the last year ( no contusion of the head). Pt also noted being diagonised with hemochromatosis     Exercise  Assessment:  Exercise history pre-cardiac event  DASI (Score):  (will collect on the first visit )   DASI METS:     Work Related Physical Requirements:  retired     Physical Limitations:  yes(Arithtis in neck, back, R. hand, L. knee)    Current Exercise   Regular Exercise:  yes  Where:  Walk  Frequency:  7 days/week  Minutes per day:  30:   Strength Training:  No   Adherence:  Does not adhere to cardiovascular exercise recommendations, Does not adhere to strength exercise recommendations  Education/Intervention:  Develop a home exercise plan, Home Exercise Log, Educate on RPE, THR, warm up/cool down, Educate on signs/symptoms of cardiovascular compromise, Self monitoring using pulse taking, heart rate monitor, activity tracker, Progress time and intensity when a steady state of HR and RPE occur  Goals:  Increase in METS by at least 40%, Cardiovascular exercise 30-50 minutes 5x week, Strength exercise 2 -3 days per week, Avoid inactivity   Goal(s) Status:  Not applicable on initial assessment   Comment:  Prior to the placement of a recent stent, the pt reported that his exercise routine consistefd of walking  2 miles daily with his dog and using his stationary bike for 15-20 minutes every other day. Currently his exercise routine consist of walking 7 days out of the week for 30 minutes and doing maintenance around the  house as needed.  Current Peak MET Level:     Session #3 MET Level:     Peak MET Goal:     MET Level Percentage Increase:       Exercise Prescription/Plan  Frequency:  Cardiac Rehab 2 days/week  THR:  RHR + 15-30   RPE: 11-16:  11-14  METS:  2-4 as tol    Time:  20-29 minutes  Mode:  Treadmill, Bike, Nu Step, Elliptical, Walking  Strength Training:  Begin when cleared by CR clinician  Comment:  Pt will attend CR 2-3 times out of the week working within a MET range of 2-4 as tol and a RPE range of 11-14. Pt will follow a gradual progression model.    Nutrition  Assessment:     Height:  177.8 cm (5\' 10" )  Weight (lbs):  200lbs(pt stated )  End of Program Weight Goal:     BMI (calculated):  28.7   Waist Circumference (inches):     Body Composition:  Overweight BMI 25-29.9  Rate Your Plate:  (will collect on the inital visit)  Current Eating Plan:  Unspecified nutrition plan, Other  Hydration:  Water, Coffee, Beer  Fluid Restriction:  No fluid restriction  Education/Intervention:  Modify caloric intake per RD recommendations, Modify nutrient intake per RD recommentations, Increase exercise to 30-50 minutes a day, RD consult for nutrition recommendations, Hydration guidelines, RYP score <58 meet  with RD  Dietitian Consult Date:     Consult Status:  Consult not yet scheduled  Food Log:  Not applicable  Goals:  BMI 18.5-24.9, Waist (men) <40 inches, (women) <35 inches, Decrease body weight, Modify caloric intake, Modify nutrient intake, Heart healthy eating   Goal(s) Status:  Not applicable on initial assessment   Comment:  Pt report that he eats three meals a day. For breakfast, the pt reported having bacon, eggs, [place] sausage, and tomatoes. For lunch, the pt reported having cereal with fruit. For dinnerl, the pt reported having whole wheat noodles, chicken, veggies. Pt reported that he drinks 3 beers in a week. Pt notes that he faces challenges with portioning his food.Pt also noted that he has lost a total of 7  lbs.    Dietitian Consult Completed  Diagnosis 1 (Intake):      Diagnosis 2 (Clinical):       Diagnosis 3 (Behavioral):     Additional Nutrition Dx:     Nutrition Plan:       Nutrition Rx  Daily Calorie Goal:     Total Fat g/day:       Protein g/day:     Estimated Carbohydrate Needs:     Cholesterol mg/day:     Sodium mg/day:     Saturated fat g/day:     Fiber g/day:       Diabetes Meal Planning  CHO per meal:     CHO per snack:     Nutrition Follow Up:     Nutrition Goal 1:     Nutritional Goal 1 Status:      Nutrition Goal 2:      Nutritional Goal 2 Status:     Comment:         Psychosocial/Stress  Assessment:  Positive coping mechanisms, Adequate support system  Support Assessment:  Patient verbalizes adequate support, Lives with spouse/partner  Barriers to Learning:  None  Barriers to Attendance:  None:   Occupation:  retired   Work Status:  Retired  Initial PHQ-9 Depression Screening Score:  (will collect on the first visit )  Repeat PHQ-9 Depression Screening Score:     Level of Severity PHQ-9:     Initial GAD-7 Anxiety Screening Score:  (will collect on the first visit )  Repeat GAD-7 Anxiety Screening Score:      Level of Severity GAD-7:     Quality of Life Survey:  (will collect on the first visit )  Quality of Life Survey Score:     Education/Intervention:  Teach and support self-help strategies, Stress management/relaxation handouts, Medication adherence, Regular physical activity/exercise, Assist patient with identifying stressors, Educate on positive coping mechanisms   Goals:  Adequate support system, Positive coping mechanisms, Manageable level of psychosocial stress due to anxiety, depression, anger and hostility, Improvement in psychosocial survey score, Improved quality of life  Goal(s) Status:  Not applicable on initial assessment  Comment:  Pt reported that his stress levels are mainly low. Pt stated that he does not get stressed. reported that his stress levels were slightly elevated due to the  election.     Medication Compliance  Assessment:  ACE/ARB, Beta Blocker, Statin, Antiplatelet, Oral Hypoglycemic  Education/Intervention:  Medication reconciliation, Medication handouts, Reinforce medication adherence  Goals:  Daily medication adherence, Patient verbalizes adherence  Goal(s) Status:  Not applicable on initial assessment  Comment:  Pt is knowledgable and compliant to taking all meds as perscribed.      Tobacco Cessation:  Assessment:  Former tobacco user (> 6 months)    Tobacco Cessation  Education/Intervention:     Goals:  Remain tobacco free  Goal(s) Status:  Not applicable on initial assessment  Comment:  Pt has hx of using tobacco products for 10 years and quit smoking Jan. 3 rd 2013. Pt notes that he continues to remain tobacoo free,  Quit Date:  09/26/78  Packs per day:  1  Number of years of tobacco use:  40    Blood Pressure  Assessment:  Dx HTN   Resting BP Range:  120-135/70-80(Avg resting BP ( pt stated) )  Education/Intervention:  Reduce packaged and processed food, Reduce sodium intake, Medication compliance, Daily weights, Obtain home BP monitor, Reinforce stress management techniques, Cardiovascular exercise of 150-300 minutes per week/increase daily activity  Goals:  BP<130/80 for Diabetic guidelines, Sodium intake <2000 mg, BMI 18.5 - 24.9, Exercise 30 minutes 5x week (150 minutes per week), Monitor BP at home  Goal(s) Status:  Not applicable on initial assessment  Comment:  Pt states that he has been treated for HTN since the 1980s. Pt states that he has not been montioring his blood pressure d/t a malfunctioning cuff and plans to purchase another one if the current one can not be fixed. Pt remains compliant to taking all of his blood pressure meds as perscribed.    Diabetes  Assessment:  Type 2 Diabetes  Fasting Blood Sugar Range (mg/dL):  46-962  X5M Collection Date:  08/06/19  A1C%:  6.5  Education/Intervention:  Maintain medication compliance, Educate patient on signs and  symptoms on hyper/hypoglycemia, Teach insulin effects of exercise, Dietitian consult for medical nutrition therapy, Teach self monitoring during exercise, Provide diabetes self-management education referral, Instructed on proper foot care  Goals:  HbA1C <7%,  FBS 90-130, Postprandial BS <180, BP <130/80 or within MD recommended, Recognize signs and symptoms, Self monitor blood sugar status and self manage activities  Goal(s) Status:  Not applicable on initial assessment  Comment:  Pt states that he has been treated for T2DM for 2 years. Pt notes taking his FBG once in the morning. Pt was encouraged to take his BS at least 3 times a day.    Lipid Management  Assessment:  HLD   Lipid Collection Date:  08/04/19  Total Cholesterol (mg/dL) (if available):  841  HDL (mg/dL) (if available):  33  LDL (mg/dL) (if available):  83   Triglycerides (mg/dL) (if available):  324  VLDL (mg/dL) (if available):  42  Education/Intervention:  Educate on current lipid guidelines, Reduce saturated fat, increase unsaturated fat, Decrease refined carbohydrates, Consider soluble fiber 10-25 grams per day, Weight management, Increase physical activity  Goals:  Total Chol <200, LDL (CAD) <70, Women HDL >50 mg/dl - Men HDL >40 mg/dl, Triglycerides <102  Goal(s) Status:  Not applicable on inital assessment   Comment:  Lipid panel was reviewed . pt notes taking lipitor as perscribed    Patient Stated Goals  Patient Stated Goal 1:  "walk longer distances"  Goal 1 Status:  Not applicable on initial assessment  Comment:     Patient Stated Goal 2:  " getting back to the things that he use to do "  Goal 2 Status: Not applicable on initial assessment   Comment:     Patient Stated Goal 3:  " have the energy and stamina to get back into playing golf "  Goal 3 Status:  Not applicable on initial assessment  Comment:     Patient Stated  Goal 4:     Goal 4 Status:      Comment:

## 2019-10-23 NOTE — Progress Notes (Signed)
Cardiac Rehabilitation, [facility] Initial Assessment  10/23/2019  Kent Jordan, Kent Jordan  [id number]   Kent Jordan is a 69 y.o. male who has been referred for Cardiac Rehabilitation by Dr.Day with a referring diagnosis of PCI/Stent X 2 of the LCx 10/09/2019     History:    Past Medical History:   Diagnosis Date    Arrhythmia     when on ARB    Arthritis     psoriatic and osteoarthritis     Back pain     Blood disorder 11/2014    Hemochromatosis    Diabetes mellitus 2016    started metformin 05/2016 d/t A1C 7.1 - now checking BS 2x/day - average 70-110 in the last week - A1C on 06/29/16 = 6.3 , has also lost 16 lbs since 05/2016    Difficulty in walking(719.7)     Uses cane to ambulate    Elevated liver function tests     had abd u/s 07/05/16     Failed back syndrome     Fatty liver     Hemochromatosis     phlebotomy every 3 months - last done 02/2016    Hemochromatosis 09/2014    High cholesterol     Hypertensive disorder 06/2016    norvasc recently increased to 10 mg daily - now controlled on meds - most recent BP 117/73    Low back pain     Lung nodule 2012    benign - was monitored x  3 yrs by Dr. Bernita Raisin (Pulm) - signed off from pulmonology - no further f/u required since 2015    Neuropathy     Tingling left foot, numbness left toes    Psoriasis     Psoriatic arthritis     tx w/ enbrel    S/P insertion of spinal cord stimulator 2015    Sleep apnea     mild - does not have CPAP - was unable to tolerate, less snoring since turboplasty    Type 2 diabetes mellitus, controlled        Family History   Problem Relation Age of Onset    Heart disease Mother     Heart disease Father       Past Surgical History:   Procedure Laterality Date    ABDOMINAL SURGERY  03/1971    double hernia repair    ARTHROPLASTY, KNEE, TOTAL Right 08/20/2016    Procedure: ARTHROPLASTY, KNEE, TOTAL;  Surgeon: Harriet Masson, MD;  Location: [facility];  Service: Orthopedics;  Laterality: Right;  RIGHT TOTAL KNEE  REPLACEMENT    ARTHROSCOPY, KNEE Right 03/21/2016    Procedure: ARTHROSCOPY, KNEE, RIGHT, PARTIAL MEDIAL MENISCECTOMY, PARTIAL SYNOVECTOMY;  Surgeon: Verner Chol, MD;  Location: [facility];  Service: Orthopedics;  Laterality: Right;    CATARACT EXT.WITH IOL Bilateral 2009    FRACTURE SURGERY  1993    right middle finger    INSERTION, SPINAL CORD STIMULATOR GENERATOR N/A 05/07/2014    Procedure: DORSAL COLUMN STIMULATOR PLACEMENT;  Surgeon: Tera Helper, MD;  Location: [facility];  Service: Neurosurgery;  Laterality: N/A;  DCS PLACEMENT    KNEE ARTHROCENTESIS  1983    right    LUMBAR FUSION  10/2011    MICRODISCECTOMY LUMBAR  06/2011, 07/2011    RIGHT & LEFT HEART CATH N/A 10/09/2019    Procedure: RIGHT & LEFT HEART CATH;  Surgeon: Day, Bertram Millard, MD;  Location: [facility];  Service: Cardiovascular;  Laterality: N/A;  SEPTOPLASTY  10/05/2015    SKIN BIOPSY  11/2015    Negative      Social History     Tobacco Use    Smoking status: Former Smoker     Packs/day: 1.00     Years: 41.00     Pack years: 41.00     Quit date: 09/26/2011     Years since quitting: 8.0    Smokeless tobacco: Never Used   Substance Use Topics    Alcohol use: Yes     Alcohol/week: 2.0 standard drinks     Types: 4 Cans of beer per week    Drug use: No        Allergies: Allergies   Allergen Reactions    Angiotensin Receptor Blockers Other (See Comments)     ARB combined with Celebrex caused Arrhythmia due to high K+    Ciprofloxacin Swelling and Anaphylaxis     swelling    Ace Inhibitors Angioedema    Gabapentin Other (See Comments)     blurred vision, dizziness      Current Medications:   Current Outpatient Medications:     amLODIPine (NORVASC) 10 MG tablet, Take 10 mg by mouth daily  , Disp: , Rfl:     apixaban (ELIQUIS) 5 MG, Take 1 tablet (5 mg total) by mouth 2 (two) times daily, Disp: 60 tablet, Rfl: 3    celecoxib (CeleBREX) 200 MG capsule, Take 200 mg by mouth 2 (two) times daily, Disp: , Rfl:      clopidogrel (PLAVIX) 75 mg tablet, Take 1 tablet (75 mg total) by mouth daily Maintenance dose, Disp: 90 tablet, Rfl: 3    DULoxetine (CYMBALTA) 60 MG capsule, Take 60 mg by mouth daily., Disp: , Rfl:     folic acid (FOLVITE) 400 MCG tablet, Take 400 mcg by mouth daily., Disp: , Rfl:     hydroCHLOROthiazide (HYDRODIURIL) 25 MG tablet, Take 25 mg by mouth daily.  , Disp: , Rfl:     metFORMIN (GLUCOPHAGE-XR) 500 MG 24 hr tablet, TAKE 1 TABLET (500 MG TOTAL) BY MOUTH EVERY MORNING WITH BREAKFAST, Disp: 90 tablet, Rfl: 1    metoprolol succinate XL (TOPROL-XL) 25 MG 24 hr tablet, Take 25 mg by mouth daily  , Disp: , Rfl: 3    MILK THISTLE PO, Take by mouth, Disp: , Rfl:     Omega-3 Fatty Acids (Omega-3 Fish Oil) 500 MG Cap, Take 1,400 mg by mouth daily  , Disp: , Rfl:     pantoprazole (PROTONIX) 20 MG tablet, Take 1 tablet (20 mg total) by mouth daily, Disp: 90 tablet, Rfl: 1    rosuvastatin (CRESTOR) 5 MG tablet, TAKE 1 TABLET BY MOUTH EVERY DAY, Disp: 90 tablet, Rfl: 1    Secukinumab, 300 MG Dose, (Cosentyx, 300 MG Dose,) 150 MG/ML Solution Prefilled Syringe, Inject into the skin, Disp: , Rfl:     traZODone (DESYREL) 50 MG tablet, Take 50 mg by mouth nightly as needed.  , Disp: , Rfl:     ondansetron (ZOFRAN-ODT) 4 MG disintegrating tablet, Take 1 tablet (4 mg total) by mouth every 6 (six) hours as needed for Nausea, Disp: 8 tablet, Rfl: 0     Cardiac Risk Factors:  Obesity Cardiac Risk Factors Obesity  Height: 177.8 cm (5\' 10" )  Weight: 90.7 kg (200 lb)(pt stated weight )  BMI (calculated): 28.8      Hypertension Comment: Pt states that he has been treated for HTN since the 1980s. Pt states that he  has not been montioring his blood pressure d/t a malfunctioning cuff and plans to purchase another one if the current one can not be fixed. Pt remains compliant to taking all of his blood pressure meds as perscribed.   Cholesterol Comment: Lipid panel was reviewed . pt notes taking lipitor as perscribed    Smoking Comment: Pt has hx of using tobacco products for 10 years and quit smoking Jan. 3 rd 2013. Pt notes that he continues to remain tobacoo free,   Diabetes Comment: Pt states that he has been treated for T2DM for 2 years. Pt notes taking his FBG once in the morning. Pt was encouraged to take his BS at least 3 times a day.   CAD  severe dyspnea with exertion, weight gain, dizziness, and fatigue.   Lifestyle/exercise Comment: Pt will attend CR 2-3 times out of the week working within a MET range of 2-4 as tol and a RPE range of 11-14. Pt will follow a gradual progression model.     Psychosocial/Communication History:   Psychosocial/Stress Comment: Pt reported that his stress levels are mainly low. Pt stated that he does not get stressed. reported that his stress levels were slightly elevated due to the election.      Physical Assessment: Height 1.778 m (5\' 10" ), weight 90.7 kg (200 lb). Body mass index is 28.7 kg/m.     Pain:   Chronic back pain rated 5-7/10       Medical Assessment:    Mr. Gears recently received a DES X 2 to the LCx on 10/09/2019. Pt has PMH of Chronic Arthritis, hemochromatosis, T2DM, HLD, HTN. Pt notes that his Anginal symptoms were severe dyspnea with exertion, weight gain, dizziness, and fatigue. Pt reports not experiencing any heart rated symptoms at this time. Pt is aware of wearing a mask to each exercise session.    Plan:    Rhonan Scobey Mach will be provided with cardiac rehab education material during the initial  visit. Patient fall risk, allergies, medical history and medication list were reconciled during todays visit.

## 2019-10-26 ENCOUNTER — Encounter (INDEPENDENT_AMBULATORY_CARE_PROVIDER_SITE_OTHER): Payer: Self-pay

## 2019-10-28 ENCOUNTER — Ambulatory Visit (INDEPENDENT_AMBULATORY_CARE_PROVIDER_SITE_OTHER): Payer: Self-pay | Admitting: Cardiovascular Disease

## 2019-10-30 ENCOUNTER — Inpatient Hospital Stay
Admission: RE | Admit: 2019-10-30 | Discharge: 2019-10-30 | Disposition: A | Discharge status: Home / Self Care | Payer: Medicare Other | Source: Ambulatory Visit | Attending: Cardiovascular Disease | Admitting: Cardiovascular Disease

## 2019-10-30 DIAGNOSIS — Z955 Presence of coronary angioplasty implant and graft: Secondary | ICD-10-CM

## 2019-10-30 LAB — GLUCOSE WHOLE BLOOD - POCT: Whole Blood Glucose POCT: 315 mg/dL — ABNORMAL HIGH (ref 70–100)

## 2019-10-30 NOTE — Progress Notes (Signed)
Pt was deferred exercise d/t hyperglycemia at rest. Pt states that he plans to follow up with endocrinologist concerning his blood sugar. Pt will not schedule out future visits for CR for now. Pt left asymptomatic and blood pressure reading at 132/62.

## 2019-11-02 ENCOUNTER — Encounter (INDEPENDENT_AMBULATORY_CARE_PROVIDER_SITE_OTHER): Payer: Self-pay

## 2019-11-03 ENCOUNTER — Encounter (INDEPENDENT_AMBULATORY_CARE_PROVIDER_SITE_OTHER): Payer: Self-pay

## 2019-11-09 ENCOUNTER — Encounter (INDEPENDENT_AMBULATORY_CARE_PROVIDER_SITE_OTHER): Payer: Self-pay

## 2019-11-23 ENCOUNTER — Telehealth: Payer: Self-pay

## 2019-11-23 NOTE — Addendum Note (Signed)
Encounter addended by: Vita Barley on: 11/23/2019 9:18 AM   Actions taken: Flowsheet data copied forward, Flowsheet accepted

## 2019-11-23 NOTE — Addendum Note (Signed)
Encounter addended by: Vita Barley on: 11/23/2019 3:55 PM   Actions taken: Flowsheet accepted, Clinical Note Signed

## 2019-11-23 NOTE — Cardiac Rehab ITP (Signed)
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  Pt has fallen 3-4 times within the last year ( no contusion of the head). Pt also noted being diagonised with hemochromatosis (Pt on medical hold due hyperglycemia at rest)    Exercise  Assessment:  Exercise history pre-cardiac event  DASI (Score):  31.45   DASI METS:  6.61  Work Related Physical Requirements:  retired     Physical Limitations:  yes(Arithtis in neck, back, R. hand, L. knee)    Current Exercise   Regular Exercise:  yes  Where:  Walk  Frequency:  7 days/week  Minutes per day:  30:   Strength Training:  No   Adherence:  Does not adhere to cardiovascular exercise recommendations, Does not adhere to strength exercise recommendations  Education/Intervention:  Develop a home exercise plan, Home Exercise Log, Educate on RPE, THR, warm up/cool down, Educate on signs/symptoms of cardiovascular compromise, Self monitoring using pulse taking, heart rate monitor, activity tracker, Progress time and intensity when a steady state of HR and RPE occur  Goals:  Increase in METS by at least 40%, Cardiovascular exercise 30-50 minutes 5x week, Strength exercise 2 -3 days per week, Avoid inactivity   Goal(s) Status:  Not applicable on initial assessment   Comment:  Prior to the placement of a recent stent, the pt reported that his exercise routine consistefd of walking  2 miles daily with his dog and using his stationary bike for 15-20 minutes every other day. Currently his exercise routine consist of walking 7 days out of the week  for 30 minutes and doing maintenance around the house as needed.(30-day ITP done in absence; pt on medical hold)  Current Peak MET Level:     Session #3 MET Level:     Peak MET Goal:     MET Level Percentage Increase:       Exercise Prescription/Plan  Frequency:  Cardiac Rehab 2 days/week  THR:  RHR + 15-30   RPE: 11-16:  11-14  METS:  2-4 as tol    Time:  20-29 minutes  Mode:  Treadmill, Bike, Nu Step, Elliptical, Walking  Strength Training:  Begin when cleared by CR clinician  Comment:  Pt will attend CR 2-3 times out of the week working within a MET range of 2-4 as tol and a RPE range of 11-14. Pt will follow a gradual progression model.    Nutrition  Assessment:     Height:  177.8 cm (5\' 10" )  Weight (lbs):  200lbs(pt stated )  End of Program Weight Goal:     BMI (calculated):  28.7   Waist Circumference (inches):     Body Composition:  Overweight BMI 25-29.9  Rate Your Plate:  61  Current Eating Plan:  Unspecified nutrition plan, Other  Hydration:  Water, Coffee, Beer  Fluid Restriction:  No fluid restriction  Education/Intervention:  Modify caloric intake per RD recommendations, Modify nutrient intake per RD recommentations, Increase exercise to 30-50 minutes a day, RD consult  for nutrition recommendations, Hydration guidelines, RYP score <58 meet with RD  Dietitian Consult Date:     Consult Status:  Consult not yet scheduled  Food Log:  Not applicable  Goals:  BMI 18.5-24.9, Waist (men) <40 inches, (women) <35 inches, Decrease body weight, Modify caloric intake, Modify nutrient intake, Heart healthy eating   Goal(s) Status:  Not applicable on initial assessment   Comment:  Pt report that he eats three meals a day. For breakfast, the pt reported having bacon, eggs, [place] sausage, and tomatoes. For lunch, the pt reported having cereal with fruit. For dinnerl, the pt reported having whole wheat noodles, chicken, veggies. Pt reported that he drinks 3 beers in a week. Pt notes that he faces challenges with  portioning his food.Pt also noted that he has lost a total of 7 lbs.(30-day ITP done in absence; pt on medical hold)    Dietitian Consult Completed  Diagnosis 1 (Intake):      Diagnosis 2 (Clinical):       Diagnosis 3 (Behavioral):     Additional Nutrition Dx:     Nutrition Plan:       Nutrition Rx  Daily Calorie Goal:     Total Fat g/day:       Protein g/day:     Estimated Carbohydrate Needs:     Cholesterol mg/day:     Sodium mg/day:     Saturated fat g/day:     Fiber g/day:       Diabetes Meal Planning  CHO per meal:     CHO per snack:     Nutrition Follow Up:     Nutrition Goal 1:     Nutritional Goal 1 Status:      Nutrition Goal 2:      Nutritional Goal 2 Status:     Comment:         Psychosocial/Stress  Assessment:  Positive coping mechanisms, Adequate support system  Support Assessment:  Patient verbalizes adequate support, Lives with spouse/partner  Barriers to Learning:  None  Barriers to Attendance:  None:   Occupation:  retired   Work Status:  Retired  Initial PHQ-9 Depression Screening Score:  2(will collect on the first visit )  Repeat PHQ-9 Depression Screening Score:     Level of Severity PHQ-9:  None-Minimal 0-4  Initial GAD-7 Anxiety Screening Score:  0(will collect on the first visit )  Repeat GAD-7 Anxiety Screening Score:  .   Level of Severity GAD-7:  None-Minimal 0-4  Quality of Life Survey:  COOP(will collect on the first visit )  Quality of Life Survey Score:  20  Education/Intervention:  Teach and support self-help strategies, Stress management/relaxation handouts, Medication adherence, Regular physical activity/exercise, Assist patient with identifying stressors, Educate on positive coping mechanisms   Goals:  Adequate support system, Positive coping mechanisms, Manageable level of psychosocial stress due to anxiety, depression, anger and hostility, Improvement in psychosocial survey score, Improved quality of life  Goal(s) Status:  Not applicable on initial assessment  Comment:  Pt  reported that his stress levels are mainly low. Pt stated that he does not get stressed. reported that his stress levels were slightly elevated due to the election. (30-day ITP done in absence; pt on medical hold)    Medication Compliance  Assessment:  ACE/ARB, Beta Blocker, Statin, Antiplatelet, Oral Hypoglycemic  Education/Intervention:  Medication reconciliation, Medication handouts, Reinforce medication adherence  Goals:  Daily medication adherence, Patient verbalizes adherence  Goal(s) Status:  Not applicable on initial assessment  Comment:  Pt is knowledgable and compliant to taking all meds as perscribed.(30-day ITP done in absence; pt on medical hold)      Tobacco Cessation:  Assessment:  Former tobacco user (> 6 months)    Tobacco Cessation  Education/Intervention:     Goals:  Remain tobacco free  Goal(s) Status:  Not applicable on initial assessment  Comment:  Pt has hx of using tobacco products for 10 years and quit smoking Jan. 3 rd 2013. Pt notes that he continues to remain tobacoo free,(30-day ITP done in absence; pt on medical hold)  Quit Date:  09/26/78  Packs per day:  1  Number of years of tobacco use:  40    Blood Pressure  Assessment:  Dx HTN   Resting BP Range:  120-135/70-80(Avg resting BP ( pt stated) )  Education/Intervention:  Reduce packaged and processed food, Reduce sodium intake, Medication compliance, Daily weights, Obtain home BP monitor, Reinforce stress management techniques, Cardiovascular exercise of 150-300 minutes per week/increase daily activity  Goals:  BP<130/80 for Diabetic guidelines, Sodium intake <2000 mg, BMI 18.5 - 24.9, Exercise 30 minutes 5x week (150 minutes per week), Monitor BP at home  Goal(s) Status:  Not applicable on initial assessment  Comment:  Pt states that he has been treated for HTN since the 1980s. Pt states that he has not been montioring his blood pressure d/t a malfunctioning cuff and plans to purchase another one if the current one can not be fixed.  Pt remains compliant to taking all of his blood pressure meds as perscribed.(30-day ITP done in absence; pt on medical hold)    Diabetes  Assessment:  Type 2 Diabetes  Fasting Blood Sugar Range (mg/dL):  16-109  U0A Collection Date:     A1C%:  6.5  Education/Intervention:  Maintain medication compliance, Educate patient on signs and symptoms on hyper/hypoglycemia, Teach insulin effects of exercise, Dietitian consult for medical nutrition therapy, Teach self monitoring during exercise, Provide diabetes self-management education referral, Instructed on proper foot care  Goals:  HbA1C <7%,  FBS 90-130, Postprandial BS <180, BP <130/80 or within MD recommended, Recognize signs and symptoms, Self monitor blood sugar status and self manage activities  Goal(s) Status:  Not applicable on initial assessment  Comment:  Pt states that he has been treated for T2DM for 2 years. Pt notes taking his FBG once in the morning. Pt was encouraged to take his BS at least 3 times a day.(30-day ITP done in absence; pt on medical hold)    Lipid Management  Assessment:  HLD   Lipid Collection Date:  08/04/19  Total Cholesterol (mg/dL) (if available):  540  HDL (mg/dL) (if available):  33  LDL (mg/dL) (if available):  83   Triglycerides (mg/dL) (if available):  981  VLDL (mg/dL) (if available):  42  Education/Intervention:  Educate on current lipid guidelines, Reduce saturated fat, increase unsaturated fat, Decrease refined carbohydrates, Consider soluble fiber 10-25 grams per day, Weight management, Increase physical activity  Goals:  Total Chol <200, LDL (CAD) <70, Women HDL >50 mg/dl - Men HDL >19 mg/dl, Triglycerides <147  Goal(s) Status:  Not applicable on inital assessment   Comment:  Lipid panel was reviewed . pt notes taking lipitor as perscribed(30-day ITP done in absence; pt on medical hold)    Patient Stated Goals  Patient Stated Goal 1:  "walk longer distances"(30-day ITP done in absence; pt on medical hold)  Goal 1 Status:  Not  applicable on initial assessment  Comment:  Patient Stated Goal 2:  " getting back to the things that he use to do "(30-day ITP done in absence; pt on medical hold)  Goal 2 Status: Not applicable on initial assessment   Comment:     Patient Stated Goal 3:  " have the energy and stamina to get back into playing golf "(30-day ITP done in absence; pt on medical hold)  Goal 3 Status:  Not applicable on initial assessment  Comment:     Patient Stated Goal 4:     Goal 4 Status:      Comment:

## 2019-11-24 ENCOUNTER — Encounter (INDEPENDENT_AMBULATORY_CARE_PROVIDER_SITE_OTHER): Payer: Self-pay

## 2019-11-30 ENCOUNTER — Encounter (INDEPENDENT_AMBULATORY_CARE_PROVIDER_SITE_OTHER): Payer: Self-pay

## 2019-12-01 ENCOUNTER — Telehealth (INDEPENDENT_AMBULATORY_CARE_PROVIDER_SITE_OTHER): Payer: Medicare Other | Admitting: "Endocrinology

## 2019-12-01 ENCOUNTER — Encounter (INDEPENDENT_AMBULATORY_CARE_PROVIDER_SITE_OTHER): Payer: Self-pay | Admitting: "Endocrinology

## 2019-12-01 ENCOUNTER — Encounter (INDEPENDENT_AMBULATORY_CARE_PROVIDER_SITE_OTHER): Payer: Self-pay

## 2019-12-01 VITALS — Ht 70.0 in | Wt 196.0 lb

## 2019-12-01 DIAGNOSIS — I10 Essential (primary) hypertension: Secondary | ICD-10-CM

## 2019-12-01 DIAGNOSIS — E782 Mixed hyperlipidemia: Secondary | ICD-10-CM

## 2019-12-01 DIAGNOSIS — E119 Type 2 diabetes mellitus without complications: Secondary | ICD-10-CM

## 2019-12-01 MED ORDER — METFORMIN HCL ER 500 MG PO TB24
1000.00 mg | ORAL_TABLET | Freq: Two times a day (BID) | ORAL | 1 refills | Status: DC
Start: 2019-12-01 — End: 2020-01-15

## 2019-12-01 MED ORDER — GLIMEPIRIDE 1 MG PO TABS
1.0000 mg | ORAL_TABLET | Freq: Every morning | ORAL | 1 refills | Status: DC
Start: 2019-12-01 — End: 2020-01-24

## 2019-12-01 NOTE — Progress Notes (Signed)
This is a video visit due to the COVID-19 pandemic.  Verbal consent has been obtained from the patient to conduct a video visit to minimize exposure to COVID-19.      Chief Complaint:  Chief Complaint   Patient presents with    Diabetes    Follow-up       HPI:  Kent Jordan is a 69 y.o. male with hemochromatosis, type 2 diabetes (Dx in 2017), hypertension, hyperlipidemia, psoriatic arthritis, who returns for follow up for type 2 diabetes.      Since the last office visit he had difficulty breathing, hospitalized at [facility] 08/03/19 for PE.  Continued to have difficulty breathing, had cardiac cath 10/09/19 s/p PCI/stent x2 of LCx 10/09/19.    He continues to drink a few beers/ day  Has not been doing cardiac rehabilitation due to hyperglycemia  He is undergoing phlebotomy every 90 days for hemochromotosis    Diabetes Medications:  Metformin 500mg  XR daily    Previous Medications:    Diabetes Complications:  Podiatry: + neuropathy sx's in L foot after L3-S1 fusion in 2013  Renal:   Lab Results   Component Value Date    CREAT 1.1 10/06/2019    CREAT 1.0 08/06/2019   , microalbumin neg (07/15/19)   Ophthalmology: no retinopathy, no glaucoma, + cataracts, last visit 2 years ago  Cardiovascular: none  Lipids: TC 159, trig 140, LDL 87, HDL 44 (02/26/19)  Blood pressure: yes    Self-monitoring blood glucose: glucometer    Checks Blood sugars: 1-2 times daily  Patient recall BGs persistently > 200 (up to 300-400s)    24 Hour diet recall: Reports diet has improved  1) Breakfast: 2 egg omelet, 2 strips bacon, hash browns (1 large potato),   2) Lunch: brown rice, protein  3) Dinner: pizza  Snacks: chocolate (candy)  Beverages: beers    Exercise: limited by arthritis pain, soreness      Labs:  Lab Results   Component Value Date    HGBA1C 6.5 (H) 08/06/2019    HGBA1C 6.9 (H) 07/15/2019    HGBA1C 5.2 02/28/2018    A1c 6.9 05/26/19      Problem List:  Patient Active Problem List   Diagnosis    Mixed hyperlipidemia     Essential hypertension    Bigeminal rhythm    Arrhythmia    Primary osteoarthritis of right knee    Status post right knee replacement    Type 2 diabetes mellitus without complication, without long-term current use of insulin    Other hemochromatosis    Acute bilateral low back pain with sciatica, sciatica laterality unspecified    Anterior knee pain, right    Overweight (BMI 25.0-29.9)    Elevated LFTs    Psoriatic arthritis    SOB (shortness of breath)    Dyspnea on exertion    Chest tightness    Chronic pulmonary embolism    Chest pain on breathing    Presence of coronary angioplasty implant and graft       Current Medications:  Current Outpatient Medications on File Prior to Visit   Medication Sig Dispense Refill    amLODIPine (NORVASC) 10 MG tablet Take 10 mg by mouth daily         celecoxib (CeleBREX) 200 MG capsule Take 200 mg by mouth 2 (two) times daily      clopidogrel (PLAVIX) 75 mg tablet Take 1 tablet (75 mg total) by mouth daily Maintenance dose 90 tablet 3  DULoxetine (CYMBALTA) 60 MG capsule Take 60 mg by mouth daily.      folic acid (FOLVITE) 400 MCG tablet Take 400 mcg by mouth daily.      hydroCHLOROthiazide (HYDRODIURIL) 25 MG tablet Take 25 mg by mouth daily.         metoprolol succinate XL (TOPROL-XL) 25 MG 24 hr tablet Take 25 mg by mouth daily     3    MILK THISTLE PO Take by mouth      pantoprazole (PROTONIX) 20 MG tablet Take 1 tablet (20 mg total) by mouth daily 90 tablet 1    rosuvastatin (CRESTOR) 5 MG tablet TAKE 1 TABLET BY MOUTH EVERY DAY 90 tablet 1    Secukinumab, 300 MG Dose, (Cosentyx, 300 MG Dose,) 150 MG/ML Solution Prefilled Syringe Inject into the skin      traZODone (DESYREL) 50 MG tablet Take 50 mg by mouth nightly as needed.          [DISCONTINUED] metFORMIN (GLUCOPHAGE-XR) 500 MG 24 hr tablet TAKE 1 TABLET (500 MG TOTAL) BY MOUTH EVERY MORNING WITH BREAKFAST 90 tablet 1    Omega-3 Fatty Acids (Omega-3 Fish Oil) 500 MG Cap Take 1,400 mg by  mouth daily         [DISCONTINUED] apixaban (ELIQUIS) 5 MG Take 1 tablet (5 mg total) by mouth 2 (two) times daily 60 tablet 3    [DISCONTINUED] ondansetron (ZOFRAN-ODT) 4 MG disintegrating tablet Take 1 tablet (4 mg total) by mouth every 6 (six) hours as needed for Nausea 8 tablet 0     No current facility-administered medications on file prior to visit.        Allergies:  Allergies   Allergen Reactions    Angiotensin Receptor Blockers Other (See Comments)     ARB combined with Celebrex caused Arrhythmia due to high K+    Ciprofloxacin Swelling and Anaphylaxis     swelling    Ace Inhibitors Angioedema    Gabapentin Other (See Comments)     blurred vision, dizziness       Past Medical History:  Past Medical History:   Diagnosis Date    Arrhythmia     when on ARB    Arthritis     psoriatic and osteoarthritis     Back pain     Blood disorder 11/2014    Hemochromatosis    Diabetes mellitus 2016    started metformin 05/2016 d/t A1C 7.1 - now checking BS 2x/day - average 70-110 in the last week - A1C on 06/29/16 = 6.3 , has also lost 16 lbs since 05/2016    Difficulty in walking(719.7)     Uses cane to ambulate    Elevated liver function tests     had abd u/s 07/05/16     Failed back syndrome     Fatty liver     Hemochromatosis     phlebotomy every 3 months - last done 02/2016    Hemochromatosis 09/2014    High cholesterol     Hypertensive disorder 06/2016    norvasc recently increased to 10 mg daily - now controlled on meds - most recent BP 117/73    Low back pain     Lung nodule 2012    benign - was monitored x  3 yrs by Dr. Bernita Raisin (Pulm) - signed off from pulmonology - no further f/u required since 2015    Neuropathy     Tingling left foot, numbness left toes    Psoriasis  Psoriatic arthritis     tx w/ enbrel    S/P insertion of spinal cord stimulator 2015    Sleep apnea     mild - does not have CPAP - was unable to tolerate, less snoring since turboplasty    Type 2 diabetes mellitus,  controlled        Past Surgical History:  Past Surgical History:   Procedure Laterality Date    ABDOMINAL SURGERY  03/1971    double hernia repair    ARTHROPLASTY, KNEE, TOTAL Right 08/20/2016    Procedure: ARTHROPLASTY, KNEE, TOTAL;  Surgeon: Harriet Masson, MD;  Location: [facility];  Service: Orthopedics;  Laterality: Right;  RIGHT TOTAL KNEE REPLACEMENT    ARTHROSCOPY, KNEE Right 03/21/2016    Procedure: ARTHROSCOPY, KNEE, RIGHT, PARTIAL MEDIAL MENISCECTOMY, PARTIAL SYNOVECTOMY;  Surgeon: Verner Chol, MD;  Location: [facility];  Service: Orthopedics;  Laterality: Right;    CATARACT EXT.WITH IOL Bilateral 2009    FRACTURE SURGERY  1993    right middle finger    INSERTION, SPINAL CORD STIMULATOR GENERATOR N/A 05/07/2014    Procedure: DORSAL COLUMN STIMULATOR PLACEMENT;  Surgeon: Tera Helper, MD;  Location: [facility];  Service: Neurosurgery;  Laterality: N/A;  DCS PLACEMENT    KNEE ARTHROCENTESIS  1983    right    LUMBAR FUSION  10/2011    MICRODISCECTOMY LUMBAR  06/2011, 07/2011    RIGHT & LEFT HEART CATH N/A 10/09/2019    Procedure: RIGHT & LEFT HEART CATH;  Surgeon: Day, Bertram Millard, MD;  Location: [facility];  Service: Cardiovascular;  Laterality: N/A;    SEPTOPLASTY  10/05/2015    SKIN BIOPSY  11/2015    Negative       Family History:  Family History   Problem Relation Age of Onset    Heart disease Mother     Heart disease Father        Social History:  Social History     Socioeconomic History    Marital status: Married     Spouse name: Not on file    Number of children: Not on file    Years of education: Not on file    Highest education level: Not on file   Occupational History    Not on file   Social Needs    Financial resource strain: Not on file    Food insecurity     Worry: Not on file     Inability: Not on file    Transportation needs     Medical: Not on file     Non-medical: Not on file   Tobacco Use    Smoking status: Former Smoker     Packs/day: 1.00      Years: 41.00     Pack years: 41.00     Quit date: 09/26/2011     Years since quitting: 8.1    Smokeless tobacco: Never Used   Substance and Sexual Activity    Alcohol use: Yes     Alcohol/week: 2.0 standard drinks     Types: 4 Cans of beer per week     Frequency: 4 or more times a week    Drug use: No    Sexual activity: Yes     Partners: Female     Birth control/protection: Surgical   Lifestyle    Physical activity     Days per week: Not on file     Minutes per session: Not on file  Stress: Not on file   Relationships    Social connections     Talks on phone: Not on file     Gets together: Not on file     Attends religious service: Not on file     Active member of club or organization: Not on file     Attends meetings of clubs or organizations: Not on file     Relationship status: Not on file    Intimate partner violence     Fear of current or ex partner: Not on file     Emotionally abused: Not on file     Physically abused: Not on file     Forced sexual activity: Not on file   Other Topics Concern    Not on file   Social History Narrative    Not on file       Visit Vitals  Ht 1.778 m (5\' 10" )   Wt 88.9 kg (196 lb)   BMI 28.12 kg/m        BP Readings from Last 3 Encounters:   10/30/19 134/64   10/09/19 158/76   08/06/19 154/69        Wt Readings from Last 4 Encounters:   12/01/19 1456 88.9 kg (196 lb)   10/30/19 1100 92.7 kg (204 lb 6.4 oz)   10/23/19 1000 90.7 kg (200 lb)   10/09/19 0917 92.1 kg (203 lb)       Physical Exam:  GENERAL APPEARANCE: alert, in no acute distress, well developed, well nourished  HEENT:  no proptosis, no scleral icterus, no conjunctival erythema  PSYCHIATRIC: normal mood, appropriate affect        Assessment/Plan:  KHAMERON GRUENWALD is a 69 y.o. male with    1. Type 2 diabetes mellitus without complication, without long-term current use of insulin    2. Essential hypertension    3. Mixed hyperlipidemia        1. Type 2 diabetes mellitus without complication, without long-term  current use of insulin  - Increase metformin up to maximum of 500mg  ER 2 tabs BID (increase by 1 tablet / week)  - Start amaryl 1mg  daily for now  - Continue to monitor BGs  - Check labs including c peptide  - Send BG log in 1 week    2. Essential hypertension  -  Continue current regimen as per cardiology    3. Mixed hyperlipidemia  - Continue crestor 5mg  daily  - Check fasting lipid profile        Orders Placed This Encounter    C-Peptide    Comprehensive metabolic panel    Hemoglobin A1C    Lipid panel    glimepiride (AMARYL) 1 MG tablet    metFORMIN (GLUCOPHAGE-XR) 500 MG 24 hr tablet        Medications Discontinued During This Encounter   Medication Reason    ondansetron (ZOFRAN-ODT) 4 MG disintegrating tablet Therapy completed    apixaban (ELIQUIS) 5 MG Therapy completed    metFORMIN (GLUCOPHAGE-XR) 500 MG 24 hr tablet Reorder        Return in about 1 month (around 01/01/2020).    Zerita Boers, MD

## 2019-12-03 ENCOUNTER — Other Ambulatory Visit (FREE_STANDING_LABORATORY_FACILITY): Payer: Medicare Other

## 2019-12-03 DIAGNOSIS — E119 Type 2 diabetes mellitus without complications: Secondary | ICD-10-CM

## 2019-12-03 DIAGNOSIS — I10 Essential (primary) hypertension: Secondary | ICD-10-CM

## 2019-12-03 DIAGNOSIS — E782 Mixed hyperlipidemia: Secondary | ICD-10-CM

## 2019-12-03 LAB — COMPREHENSIVE METABOLIC PANEL
ALT: 149 U/L — ABNORMAL HIGH (ref 0–55)
AST (SGOT): 90 U/L — ABNORMAL HIGH (ref 5–34)
Albumin/Globulin Ratio: 1.3 (ref 0.9–2.2)
Albumin: 4.2 g/dL (ref 3.5–5.0)
Alkaline Phosphatase: 136 U/L — ABNORMAL HIGH (ref 38–106)
Anion Gap: 16 — ABNORMAL HIGH (ref 5.0–15.0)
BUN: 16 mg/dL (ref 9.0–28.0)
Bilirubin, Total: 0.5 mg/dL (ref 0.2–1.2)
CO2: 21 mEq/L (ref 21–29)
Calcium: 9 mg/dL (ref 8.5–10.5)
Chloride: 99 mEq/L — ABNORMAL LOW (ref 100–111)
Creatinine: 1.1 mg/dL (ref 0.5–1.5)
Globulin: 3.3 g/dL (ref 2.0–3.7)
Glucose: 184 mg/dL — ABNORMAL HIGH (ref 70–100)
Potassium: 4.5 mEq/L (ref 3.5–5.1)
Protein, Total: 7.5 g/dL (ref 6.0–8.3)
Sodium: 136 mEq/L (ref 136–145)

## 2019-12-03 LAB — LIPID PANEL
Cholesterol / HDL Ratio: 5.4
Cholesterol: 200 mg/dL — ABNORMAL HIGH (ref 0–199)
HDL: 37 mg/dL — ABNORMAL LOW (ref 40–9999)
LDL Calculated: 108 mg/dL — ABNORMAL HIGH (ref 0–99)
Triglycerides: 273 mg/dL — ABNORMAL HIGH (ref 34–149)
VLDL Calculated: 55 mg/dL — ABNORMAL HIGH (ref 10–40)

## 2019-12-03 LAB — HEMOLYSIS INDEX: Hemolysis Index: 4 (ref 0–18)

## 2019-12-03 LAB — HEMOGLOBIN A1C
Average Estimated Glucose: 274.7 mg/dL
Hemoglobin A1C: 11.2 % — ABNORMAL HIGH (ref 4.6–5.9)

## 2019-12-03 LAB — GFR: EGFR: >60

## 2019-12-04 ENCOUNTER — Other Ambulatory Visit (INDEPENDENT_AMBULATORY_CARE_PROVIDER_SITE_OTHER): Payer: Self-pay | Admitting: "Endocrinology

## 2019-12-04 ENCOUNTER — Encounter (INDEPENDENT_AMBULATORY_CARE_PROVIDER_SITE_OTHER): Payer: Self-pay | Admitting: "Endocrinology

## 2019-12-04 DIAGNOSIS — R7401 Elevation of levels of liver transaminase levels: Secondary | ICD-10-CM

## 2019-12-04 LAB — C-PEPTIDE: C-Peptide: 6.3 ng/mL — ABNORMAL HIGH (ref 1.1–4.4)

## 2019-12-04 NOTE — Progress Notes (Signed)
lfts

## 2019-12-08 ENCOUNTER — Encounter (INDEPENDENT_AMBULATORY_CARE_PROVIDER_SITE_OTHER): Payer: Self-pay | Admitting: "Endocrinology

## 2019-12-08 ENCOUNTER — Encounter (INDEPENDENT_AMBULATORY_CARE_PROVIDER_SITE_OTHER): Payer: Self-pay

## 2019-12-11 ENCOUNTER — Other Ambulatory Visit (INDEPENDENT_AMBULATORY_CARE_PROVIDER_SITE_OTHER): Payer: Self-pay | Admitting: Student in an Organized Health Care Education/Training Program

## 2019-12-16 ENCOUNTER — Other Ambulatory Visit (INDEPENDENT_AMBULATORY_CARE_PROVIDER_SITE_OTHER): Payer: Self-pay | Admitting: Student in an Organized Health Care Education/Training Program

## 2019-12-16 NOTE — Telephone Encounter (Signed)
Too early

## 2019-12-21 ENCOUNTER — Inpatient Hospital Stay
Admission: RE | Admit: 2019-12-21 | Discharge: 2019-12-21 | Disposition: A | Discharge status: Home / Self Care | Payer: Medicare Other | Source: Ambulatory Visit

## 2019-12-21 ENCOUNTER — Inpatient Hospital Stay
Admission: RE | Admit: 2019-12-21 | Discharge: 2019-12-21 | Disposition: A | Discharge status: Home / Self Care | Payer: Medicare Other | Source: Ambulatory Visit | Attending: Cardiovascular Disease | Admitting: Cardiovascular Disease

## 2019-12-21 DIAGNOSIS — Z955 Presence of coronary angioplasty implant and graft: Secondary | ICD-10-CM | POA: Insufficient documentation

## 2019-12-21 NOTE — Patient Instructions (Signed)
Patient Recommendations:  . Patient GOALS: Patient will try to keep his meal schedule consistent: 8 am, 1 pm, 6 pm.  Patient will try to keep his carbs to 45 grams or less per meal; Patient will try to keep MyPlate proportion.  . Ensure you are achieving restful sleep for 7-8 hours per night.  Sleep deprivation leads to dysregulation of blood lipid and blood sugar.  It is also associated with obesity.  Check out http://sleep.org for tips on how to achieve better sleep.  . Chronic stress impacts health and can contribute to heart disease, diabetes, and obesity.  Talk to your doctor about ways to reduce stress.    . Meal schedule: 3 meals per day no more than 5 hours apart.  Example: breakfast @8  am, lunch @1  pm, dinner @6  pm.  . If you are hungry or your blood sugar is low before bed, have a bedtime snack: small amount of carbohydrate + protein - nuts + dried fruit; Glucerna; slice of whole grain+ peanut butter; yogurt  . Drink black/green tea with your meals; DO NOT use a cast iron skillet  . Use MyPlate as a guide: Use a 9 inch plate.    o 3/4th of plate are plant foods: vegetables, whole grains, and/or fruit.    o 1/4th plate is a lean protein: chicken w/o skin, lean cuts of beef, fish, shellfish, nuts, legumes/beans, or soy.  See handout for details.  o Diabetic diet: 45 grams carbs/meal  Components of a Cardioprotective Diet  . Fruits: any fresh or canned (in juice) is fine.  Consume with meals - not between meals.   . Vegetables: any fresh, frozen, raw, stir-fried, sauteed, steamed, or baked/roasted/grilled.  Choose a variety.  . Whole grains: brown rice, oats, whole wheat, quinoa, buckwheat, barley, corn  . Lean protein: fish, chicken w/o skin, beans, soy - red meat 1-2x/week if desired  . Nuts: any kind w/o salt; consume with meals; very fatty so don't over do it!  . Source of calcium: 1% or skim cow's milk, yogurt, soy, pea, almond, or rice milk with meals.  . Healthy fats: avocado, olive oil, canola oil -  any liquid oils; consume in moderation.  . Foods to lose/limit: processed foods, high fat red meat, deli foods, cured/smoked foods, high-sugar foods, hard cheeses, sweets (cookies, cakes, pies, donuts, ice cream), cream, salty snacks, refined flour products.  . Beverages: Limit or avoid all sugary drinks including juices.  Zero-calorie or low-calorie beverages are fine.  Try plain water or flavored seltzer.   . Alcohol: If you drink alcohol, do so in moderation: one serving per day (for both men and women). One serving is equivalent to 12 ounces beer, 5 ounces wine, or 1.5 ounces distilled spirits.  Marland Kitchen REVISED meals:  Try Oldways - Google it  Breakfast: 6 am - 2 1/2 cups coffee (black); 8 am 1 cup oatmeal + berries + cinnamon + nuts + V8 juice OR 1 egg whole + egg white scrambled + mushrooms, onions, spinach (in olive oil) + 1/3rd cup brown rice + fruit on the side  WATER  Lunch: Leftover - 1 cup of [place] chicken + steam broccoli + 1/2 cup brown rice OR 1 cup lentil soup (carrots, garlic, onions, celery) + whole wheat dinner roll  WATER  Dinner: 6 pm 3 ounce steak sauted in olive oil + salad (lettuce, tomatoes, carrots, cucumber) w/Good Season's [place] dressing + black tea  Alcohol: 2 beers (22 grams carbs)  Bedtime snack: Glucerna  chilled over ice as needed    Dr. Juventino Slovak, LDN  Nutritionist

## 2019-12-21 NOTE — Progress Notes (Signed)
Patient's Name: Kent Jordan    Diagnosis: Stents    Lab Results   Component Value Date/Time    LDL 108 (H) 12/03/2019 08:19 AM     Referral Source: Cardiac Rehab  Reason for Referral: Pt Sign-Up    Who was educated: Patient    Subj: RDN met with pt in cardiac rehab. Short of breath for a long time and found a tiny blood clot.  Found out he had 95% blockage - had two stents placed.  Diabetes was out of control (over 300 mg/dL).  Endocrinologist doubled metformin but messed up liver. Now on a different drug for diabetes (126 mg/dL).  Has hemochromatosis; diabetes may be caused by hemachromatosis. He does the cooking.  He is retired - wife is still working. He doesn't like fish.  Has been accommodating wife's diet.  No one has talked to him about a diabetic diet.  Says biggest problem is portion control.  Likes to snack. Takes 30 minute nap.    FBS: 184 mg/dL    Family history: Both parents died of heart disease 03-10-1975 and 58) - had heart attacks.  Brother died from alcoholism.    BMI: 29  Lost or gained weight recently: Has lost 18 pounds  Sleep schedule: Takes sleeping pills; wake @ 6 am, sleep @ 10 pm  Meal schedule: 2 meals per day - late breakfast @10 :30 am, dinner @7 :30-8 pm  Retired: Yes  Diabetes: Uncontrolled - A1c was 11  Kidney disease: no  Other conditions: Arthritis, hemachromatosis  Food allergies: None  Special diet: None (vegan, gluten-free, kosher, low sodium)  Dietary supplement: None    Diet recall as follows:  Breakfast: 6 am - 2 1/2 cups coffee (black); 9:30 am everything bagel with cream cheese + low sodium V8; today English muffin w/Canadian bacon  Snack: cookies  Dinner: 7:30 pm [place] food take out [employer]) Hot n Sour soup; 2-3 cups of [place] chicken w/vegetables, 1/2 cup white rice, 3 beers  Beverages: coffee, water, occasionally cut of juice  Alcohol: 3 beers/day; occasionally scotch or bourbon  Oils: Olive, vegetable, peanut, not much butter or margarine    Physical Activity: Walks  dog 2-3x/day for 20 minutes; 10-15 minutes every other day/stationary bike;     Meals outside home: orders out 1x/week    Nutrition goals: Patient will try to keep his meal schedule consistent: 8 am, 1 pm, 6 pm.  Patient will try to keep his carbs to 45 grams or less per meal; Patient will try to keep MyPlate proportion.    Obj: Patient has a strong family history of heart disease.  He also has hemachromatosis.  His diet history shows he is consuming too much iron (using an iron skillet + eating red meat).  His diet is inconsistent, too much space between meals.  His A1c is 11 and his blood lipids are elevated.  He has not had any diabetes counseling.      Patient Recommendations:  . Patient GOALS: Patient will try to keep his meal schedule consistent: 8 am, 1 pm, 6 pm.  Patient will try to keep his carbs to 45 grams or less per meal; Patient will try to keep MyPlate proportion.  . Ensure you are achieving restful sleep for 7-8 hours per night.  Sleep deprivation leads to dysregulation of blood lipid and blood sugar.  It is also associated with obesity.  Check out http://sleep.org for tips on how to achieve better sleep.  . Chronic stress  impacts health and can contribute to heart disease, diabetes, and obesity.  Talk to your doctor about ways to reduce stress.    . Meal schedule: 3 meals per day no more than 5 hours apart.  Example: breakfast @8  am, lunch @1  pm, dinner @6  pm.  . If you are hungry or your blood sugar is low before bed, have a bedtime snack: small amount of carbohydrate + protein - nuts + dried fruit; Glucerna; slice of whole grain+ peanut butter; yogurt  . Drink black/green tea with your meals; DO NOT use a cast iron skillet  . Use MyPlate as a guide: Use a 9 inch plate.    o 3/4th of plate are plant foods: vegetables, whole grains, and/or fruit.    o 1/4th plate is a lean protein: chicken w/o skin, lean cuts of beef, fish, shellfish, nuts, legumes/beans, or soy.  See handout for  details.  o Diabetic diet: 45 grams carbs/meal  Components of a Cardioprotective Diet  . Fruits: any fresh or canned (in juice) is fine.  Consume with meals - not between meals.   . Vegetables: any fresh, frozen, raw, stir-fried, sauteed, steamed, or baked/roasted/grilled.  Choose a variety.  . Whole grains: brown rice, oats, whole wheat, quinoa, buckwheat, barley, corn  . Lean protein: fish, chicken w/o skin, beans, soy - red meat 1-2x/week if desired  . Nuts: any kind w/o salt; consume with meals; very fatty so don't over do it!  . Source of calcium: 1% or skim cow's milk, yogurt, soy, pea, almond, or rice milk with meals.  . Healthy fats: avocado, olive oil, canola oil - any liquid oils; consume in moderation.  . Foods to lose/limit: processed foods, high fat red meat, deli foods, cured/smoked foods, high-sugar foods, hard cheeses, sweets (cookies, cakes, pies, donuts, ice cream), cream, salty snacks, refined flour products.  . Beverages: Limit or avoid all sugary drinks including juices.  Zero-calorie or low-calorie beverages are fine.  Try plain water or flavored seltzer.   . Alcohol: If you drink alcohol, do so in moderation: one serving per day (for both men and women). One serving is equivalent to 12 ounces beer, 5 ounces wine, or 1.5 ounces distilled spirits.  Marland Kitchen REVISED meals:  Try Oldways - Google it  Breakfast: 6 am - 2 1/2 cups coffee (black); 8 am 1 cup oatmeal + berries + cinnamon + nuts + V8 juice OR 1 egg whole + egg white scrambled + mushrooms, onions, spinach (in olive oil) + 1/3rd cup brown rice + fruit on the side  WATER  Lunch: Leftover - 1 cup of [place] chicken + steam broccoli + 1/2 cup brown rice OR 1 cup lentil soup (carrots, garlic, onions, celery) + whole wheat dinner roll  WATER  Dinner: 6 pm 3 ounce steak sauted in olive oil + salad (lettuce, tomatoes, carrots, cucumber) w/Good Season's [place] dressing + black tea  Alcohol: 2 beers (22 grams carbs)  Bedtime snack: Glucerna chilled over  ice as needed    Type of Instruction Provided:  Verbal     Level of Understanding:  Adequate    Juventino Slovak, EdD, RDN  Nutritionist    12/21/2019    11:52 AM    Length of visit: 1 hour

## 2019-12-21 NOTE — Progress Notes (Signed)
Cardiac Rehab Individual Treatment Plan    Assessment Period  Phase:  30 Day Re-assessment(ITP done in absence; hyperglycemia at rest)  Program:  Currently enrolled in Phase 2  Session #:     First Exercise Session (Date):  10/30/19  Referring Diagnosis:  Stent(DES X 2 to LCx 10/09/2019)  Date of Event:  10/09/19  Prior Cardiac Hx:  Other(HTN, HLD, )  Risk Stratification:  Moderate risk  Ejection Fraction:  55-60%  Comment:  Pt hx of arithritis in the left knee, right hand, back, and neck.  Pt has fallen 3-4 times within the last year ( no contusion of the head). Pt also noted being diagonised with hemochromatosis (Pt on medical hold due hyperglycemia at rest)    Exercise  Assessment:  Exercise history pre-cardiac event  DASI (Score):  31.45   DASI METS:  6.61  Work Related Physical Requirements:  retired     Physical Limitations:  yes(Arithtis in neck, back, R. hand, L. knee)    Current Exercise   Regular Exercise:     Where:  Walk  Frequency:  7 days/week  Minutes per day:  40:   Strength Training:  No   Adherence:  Does not adhere to cardiovascular exercise recommendations, Does not adhere to strength exercise recommendations  Education/Intervention:  Develop a home exercise plan, Home Exercise Log, Educate on RPE, THR, warm up/cool down, Educate on signs/symptoms of cardiovascular compromise, Self monitoring using pulse taking, heart rate monitor, activity tracker, Progress time and intensity when a steady state of HR and RPE occur  Goals:  Increase in METS by at least 40%, Cardiovascular exercise 30-50 minutes 5x week, Strength exercise 2 -3 days per week, Avoid inactivity   Goal(s) Status:  Goal partially met   Comment:  Pt reports walking dog two times a day for 20 min each. He also states he uses his stationary bicycle for 10-20 min 2-3 times a week. (30-day ITP done in absence; pt on medical hold)  Current Peak MET Level:  2.8  Session #3 MET Level:     Peak MET Goal:     MET Level Percentage Increase:        Exercise Prescription/Plan  Frequency:  Cardiac Rehab 2 days/week  THR:  RHR + 15-30   RPE: 11-16:  11-14  METS:  2-4 as tol    Time:  20-29 minutes  Mode:  Treadmill, Bike, Nu Step, Elliptical, Walking  Strength Training:  Begin when cleared by CR clinician  Comment:  Pt will attend CR 2-3 times out of the week working within a MET range of 2-4 as tol and a RPE range of 11-14. Pt will follow a gradual progression model.    Nutrition  Assessment:     Height:  177.8 cm (5\' 10" )  Weight (lbs):  204.7lb(pt stated )  End of Program Weight Goal:     BMI (calculated):  29.4   Waist Circumference (inches):     Body Composition:  Overweight BMI 25-29.9  Rate Your Plate:  61  Current Eating Plan:  Unspecified nutrition plan, Other  Hydration:  Water, Coffee, Beer  Fluid Restriction:  No fluid restriction  Education/Intervention:  Modify caloric intake per RD recommendations, Modify nutrient intake per RD recommentations, Increase exercise to 30-50 minutes a day, RD consult for nutrition recommendations, Hydration guidelines, RYP score <58 meet with RD  Dietitian Consult Date:     Consult Status:  Consult not yet scheduled  Food Log:  Provided  Goals:  BMI 18.5-24.9, Waist (men) <40 inches, (women) <35 inches, Decrease body weight, Modify caloric intake, Modify nutrient intake, Heart healthy eating   Goal(s) Status:  Not applicable on initial assessment   Comment:  Pt report that he eats three meals a day. For breakfast, the pt reported having bacon, eggs, [place] sausage, and tomatoes. For lunch, the pt reported having cereal with fruit. For dinnerl, the pt reported having whole wheat noodles, chicken, veggies. Pt reported that he drinks 3 beers in a week. Pt notes that he faces challenges with portioning his food.Pt also noted that he has lost a total of 7 lbs.(30-day ITP done in absence; pt on medical hold)    Dietitian Consult Completed  Diagnosis 1 (Intake):      Diagnosis 2 (Clinical):       Diagnosis 3 (Behavioral):      Additional Nutrition Dx:     Nutrition Plan:       Nutrition Rx  Daily Calorie Goal:     Total Fat g/day:       Protein g/day:     Estimated Carbohydrate Needs:     Cholesterol mg/day:     Sodium mg/day:     Saturated fat g/day:     Fiber g/day:       Diabetes Meal Planning  CHO per meal:     CHO per snack:     Nutrition Follow Up:     Nutrition Goal 1:     Nutritional Goal 1 Status:      Nutrition Goal 2:      Nutritional Goal 2 Status:     Comment:  Seeing dietician today      Psychosocial/Stress  Assessment:  Positive coping mechanisms, Adequate support system  Support Assessment:  Patient verbalizes adequate support, Lives with spouse/partner  Barriers to Learning:  None  Barriers to Attendance:  None:   Occupation:  retired   Work Status:  Retired  Initial PHQ-9 Depression Screening Score:  2  Repeat PHQ-9 Depression Screening Score:     Level of Severity PHQ-9:  None-Minimal 0-4  Initial GAD-7 Anxiety Screening Score:  0  Repeat GAD-7 Anxiety Screening Score:  .   Level of Severity GAD-7:  None-Minimal 0-4  Quality of Life Survey:  COOP  Quality of Life Survey Score:  20  Education/Intervention:  Teach and support self-help strategies, Stress management/relaxation handouts, Medication adherence, Regular physical activity/exercise, Assist patient with identifying stressors, Educate on positive coping mechanisms   Goals:  Adequate support system, Positive coping mechanisms, Manageable level of psychosocial stress due to anxiety, depression, anger and hostility, Improvement in psychosocial survey score, Improved quality of life  Goal(s) Status:  Goal partially met  Comment:  Pt reports low stress. Things that do cause him stress is his daughter. HE states he "lets it wash out" and he doesn't let things bother him. (30-day ITP done in absence; pt on medical hold)    Medication Compliance  Assessment:  ACE/ARB, Beta Blocker, Statin, Antiplatelet, Oral Hypoglycemic  Education/Intervention:  Medication  reconciliation, Medication handouts, Reinforce medication adherence  Goals:  Daily medication adherence, Patient verbalizes adherence  Goal(s) Status:  Goal partially met  Comment:  Pt is knowledgable and compliant to taking all meds as perscribed.Pt reports medication change with his diabietes medications. IT was changed in the chart. (30-day ITP done in absence; pt on medical hold)      Tobacco Cessation:  Assessment:  Former tobacco user (> 6 months)    Tobacco Cessation  Education/Intervention:     Goals:  Remain tobacco free  Goal(s) Status:  Goal met  Comment:  Pt has hx of using tobacco products for 10 years and quit smoking Jan. 3 rd 2013. Pt notes that he continues to remain tobacoo free,(30-day ITP done in absence; pt on medical hold)  Quit Date:  09/26/78  Packs per day:  1  Number of years of tobacco use:  40    Blood Pressure  Assessment:  Dx HTN   Resting BP Range:  136-169/72-73  Education/Intervention:  Reduce packaged and processed food, Reduce sodium intake, Medication compliance, Daily weights, Obtain home BP monitor, Reinforce stress management techniques, Cardiovascular exercise of 150-300 minutes per week/increase daily activity  Goals:  BP<130/80 for Diabetic guidelines, Sodium intake <2000 mg, BMI 18.5 - 24.9, Exercise 30 minutes 5x week (150 minutes per week), Monitor BP at home  Goal(s) Status:  Goal not met  Comment:  Pt states he threw his old blood pressure cuff away and currently does not own one. He states he will be buying one. Encouraged patient to monitor his BP due to it being high during cardiac rehab session. (30-day ITP done in absence; pt on medical hold)    Diabetes  Assessment:  Type 2 Diabetes  Fasting Blood Sugar Range (mg/dL):  11-914  N8G Collection Date:     A1C%:  6.5  Education/Intervention:  Maintain medication compliance, Educate patient on signs and symptoms on hyper/hypoglycemia, Teach insulin effects of exercise, Dietitian consult for medical nutrition therapy,  Teach self monitoring during exercise, Provide diabetes self-management education referral, Instructed on proper foot care  Goals:  HbA1C <7%,  FBS 90-130, Postprandial BS <180, BP <130/80 or within MD recommended, Recognize signs and symptoms, Self monitor blood sugar status and self manage activities  Goal(s) Status:  Goal partially met  Comment:  Pt states he saw his endocrinologist and she changed his medication. His metformin was discontinued and he was started on Amaryl. He reports checking his blood sugar two times a day. He did not checlk his fasting blood sugar today. States his FBS run 150-210 so he will be reporting that to his endocrinologist.     Lipid Management  Assessment:  HLD   Lipid Collection Date:  08/04/19  Total Cholesterol (mg/dL) (if available):  956  HDL (mg/dL) (if available):  33  LDL (mg/dL) (if available):  83   Triglycerides (mg/dL) (if available):  213  VLDL (mg/dL) (if available):  42  Education/Intervention:  Educate on current lipid guidelines, Reduce saturated fat, increase unsaturated fat, Decrease refined carbohydrates, Consider soluble fiber 10-25 grams per day, Weight management, Increase physical activity  Goals:  Total Chol <200, LDL (CAD) <70, Women HDL >50 mg/dl - Men HDL >08 mg/dl, Triglycerides <657  Goal(s) Status:  Goal partially met   Comment:  Lipid panel was reviewed . pt notes taking lipitor as perscribed. Pt will be meeting with dietician today to discuss healthy eating habits to help lipids.     Patient Stated Goals  Patient Stated Goal 1:  "walk longer distances"(30-day ITP done in absence; pt on medical hold)  Goal 1 Status:  Goal not met  Comment:     Patient Stated Goal 2:  " getting back to the things that he use to do "(30-day ITP done in absence; pt on medical hold)  Goal 2 Status: Goal not met   Comment:     Patient Stated Goal 3:  " have the energy and stamina to  get back into playing golf "(30-day ITP done in absence; pt on medical hold)  Goal 3 Status:   Goal partially met  Comment:     Patient Stated Goal 4:     Goal 4 Status:      Comment:

## 2019-12-23 ENCOUNTER — Inpatient Hospital Stay
Admission: RE | Admit: 2019-12-23 | Discharge: 2019-12-23 | Disposition: A | Discharge status: Home / Self Care | Payer: Medicare Other | Source: Ambulatory Visit

## 2019-12-23 DIAGNOSIS — Z955 Presence of coronary angioplasty implant and graft: Secondary | ICD-10-CM

## 2019-12-25 ENCOUNTER — Inpatient Hospital Stay
Admission: RE | Admit: 2019-12-25 | Discharge: 2019-12-25 | Disposition: A | Discharge status: Home / Self Care | Payer: Medicare Other | Source: Ambulatory Visit | Attending: Cardiovascular Disease | Admitting: Cardiovascular Disease

## 2019-12-25 DIAGNOSIS — Z955 Presence of coronary angioplasty implant and graft: Secondary | ICD-10-CM | POA: Insufficient documentation

## 2019-12-28 ENCOUNTER — Inpatient Hospital Stay
Admission: RE | Admit: 2019-12-28 | Discharge: 2019-12-28 | Disposition: A | Discharge status: Home / Self Care | Payer: Medicare Other | Source: Ambulatory Visit

## 2019-12-28 DIAGNOSIS — Z955 Presence of coronary angioplasty implant and graft: Secondary | ICD-10-CM

## 2019-12-30 ENCOUNTER — Inpatient Hospital Stay
Admission: RE | Admit: 2019-12-30 | Discharge: 2019-12-30 | Disposition: A | Discharge status: Home / Self Care | Payer: Medicare Other | Source: Ambulatory Visit

## 2019-12-30 DIAGNOSIS — Z955 Presence of coronary angioplasty implant and graft: Secondary | ICD-10-CM

## 2019-12-31 ENCOUNTER — Encounter (INDEPENDENT_AMBULATORY_CARE_PROVIDER_SITE_OTHER): Payer: Self-pay

## 2020-01-01 ENCOUNTER — Inpatient Hospital Stay
Admission: RE | Admit: 2020-01-01 | Discharge: 2020-01-01 | Disposition: A | Discharge status: Home / Self Care | Payer: Medicare Other | Source: Ambulatory Visit

## 2020-01-01 ENCOUNTER — Encounter (INDEPENDENT_AMBULATORY_CARE_PROVIDER_SITE_OTHER): Payer: Self-pay

## 2020-01-01 DIAGNOSIS — Z955 Presence of coronary angioplasty implant and graft: Secondary | ICD-10-CM

## 2020-01-04 ENCOUNTER — Inpatient Hospital Stay
Admission: RE | Admit: 2020-01-04 | Discharge: 2020-01-04 | Disposition: A | Discharge status: Home / Self Care | Payer: Medicare Other | Source: Ambulatory Visit

## 2020-01-04 DIAGNOSIS — Z955 Presence of coronary angioplasty implant and graft: Secondary | ICD-10-CM

## 2020-01-04 NOTE — Addendum Note (Signed)
Encounter addended by: Rondel Baton, PT on: 01/04/2020 2:53 PM   Actions taken: Flowsheet accepted

## 2020-01-06 ENCOUNTER — Inpatient Hospital Stay
Admission: RE | Admit: 2020-01-06 | Discharge: 2020-01-06 | Disposition: A | Discharge status: Home / Self Care | Payer: Medicare Other | Source: Ambulatory Visit

## 2020-01-06 DIAGNOSIS — Z955 Presence of coronary angioplasty implant and graft: Secondary | ICD-10-CM

## 2020-01-08 ENCOUNTER — Ambulatory Visit: Payer: Medicare Other

## 2020-01-11 ENCOUNTER — Inpatient Hospital Stay
Admission: RE | Admit: 2020-01-11 | Discharge: 2020-01-11 | Disposition: A | Discharge status: Home / Self Care | Payer: Medicare Other | Source: Ambulatory Visit

## 2020-01-11 ENCOUNTER — Telehealth (INDEPENDENT_AMBULATORY_CARE_PROVIDER_SITE_OTHER): Payer: Self-pay | Admitting: Student in an Organized Health Care Education/Training Program

## 2020-01-11 VITALS — Ht 70.0 in

## 2020-01-11 DIAGNOSIS — Z955 Presence of coronary angioplasty implant and graft: Secondary | ICD-10-CM

## 2020-01-11 DIAGNOSIS — Z789 Other specified health status: Secondary | ICD-10-CM

## 2020-01-11 NOTE — Patient Instructions (Signed)
Patient Recommendations:  1. You are doing great.  Keep up the good work!  2. Consider reducing your alcohol intake to aid with weight loss.  3. Be patient with yourself regarding weight loss.  It takes times to lose body fat.  Continue to watch portions and exercise.  You should see results throughout the summer.      Dr. Juventino Slovak, LDN  Nutritionist

## 2020-01-11 NOTE — Progress Notes (Signed)
Patient's Name: Kent Jordan    Diagnosis: Stents    BMI: 29.59    Referral Source: Cardiac Rehab  Reason for Referral: Pt Sign-Up    Who was educated: Patient    Previous Patient Instructions:    Patient GOALS: Patient will try to keep his meal schedule consistent: 8 am, 1 pm, 6 pm.  Patient will try to keep his carbs to 45 grams or less per meal; Patient will try to keep MyPlate proportion.    Subj: Follow-up to RDN visit cardiac rehab. Has been trying to do all those things he committed to.  Has been measuring things. Has become more mindful.  Says he has more energy. Wife has started following diet. Walks the dogs 3x for 20 minutes and rides stationary bike for 20 minutes.  Tries to keep his heart rate around 90 bpm.  Is doing more resistance.  Hasn't been able to cut down on beer.  Has stopped using iron pans and drinking black tea to address his hemachromatosis.      Weight loss/gain: 203 lb and hasn't gone down    Obj: Patient has made many significant changes to his diet.  He is eating less, following MyPlate recommendations and being more mindful in general with the types of foods he is consuming.  He is still drinking 3 beers/day which is says he will work on decreasing.  His physical activity has improved - he is walking his dogs regularly and using his exercise bike.  His wife is joining him in changing their lifestyle to one which supports health.      Patient Recommendations:  1. You are doing great.  Keep up the good work!  2. Consider reducing your alcohol intake to aid with weight loss.  3. Be patient with yourself regarding weight loss.  It takes times to lose body fat.  Continue to watch portions and exercise.  You should see results throughout the summer.      Type of Instruction Provided:  Verbal     Level of Understanding: Adequate    Juventino Slovak, EdD, RDN  Nutritionist    01/11/2020    10:30 AM    Length of visit: 20 minutes    Signature: Juventino Slovak, RDN

## 2020-01-11 NOTE — Telephone Encounter (Signed)
Pt called for meds request    Eliquis 5MG  bid    Pharmacy   [facility] [id number] [place], [place] - [address] Phone:  [phone]   Fax:  [phone]

## 2020-01-13 ENCOUNTER — Inpatient Hospital Stay
Admission: RE | Admit: 2020-01-13 | Discharge: 2020-01-13 | Disposition: A | Discharge status: Home / Self Care | Payer: Medicare Other | Source: Ambulatory Visit

## 2020-01-13 DIAGNOSIS — Z955 Presence of coronary angioplasty implant and graft: Secondary | ICD-10-CM

## 2020-01-13 NOTE — Addendum Note (Signed)
Encounter addended by: Glynda Jaeger, RN on: 01/13/2020 12:41 PM   Actions taken: Flowsheet accepted

## 2020-01-13 NOTE — Telephone Encounter (Signed)
Pt notified that he has completed 6 months of Eliquis and does not need to continue.

## 2020-01-15 ENCOUNTER — Encounter (INDEPENDENT_AMBULATORY_CARE_PROVIDER_SITE_OTHER): Payer: Self-pay | Admitting: Cardiovascular Disease

## 2020-01-15 ENCOUNTER — Inpatient Hospital Stay
Admission: RE | Admit: 2020-01-15 | Discharge: 2020-01-15 | Disposition: A | Discharge status: Home / Self Care | Payer: Medicare Other | Source: Ambulatory Visit

## 2020-01-15 DIAGNOSIS — I2510 Atherosclerotic heart disease of native coronary artery without angina pectoris: Secondary | ICD-10-CM | POA: Insufficient documentation

## 2020-01-15 DIAGNOSIS — Z955 Presence of coronary angioplasty implant and graft: Secondary | ICD-10-CM

## 2020-01-15 DIAGNOSIS — I493 Ventricular premature depolarization: Secondary | ICD-10-CM | POA: Insufficient documentation

## 2020-01-15 NOTE — Cardiac Rehab ITP (Signed)
Cardiac Rehab Individual Treatment Plan    Assessment Period  Phase:  30 Day Re-assessment(ITP done in absence; hyperglycemia at rest)  Program:  Currently enrolled in Phase 2  Session #:  13  First Exercise Session (Date):  10/30/19  Referring Diagnosis:  Stent(DES X 2 to LCx 10/09/2019)  Date of Event:  10/09/19  Prior Cardiac Hx:  Other(HTN, HLD, )  Risk Stratification:  Moderate risk  Ejection Fraction:  55-60%  Comment:  Pt hx of arithritis in the left knee, right hand, back, and neck.  Pt has fallen 3-4 times within the last year ( no contusion of the head). Pt also noted being diagonised with hemochromatosis (Pt on medical hold due hyperglycemia at rest)    Exercise  Assessment:  Exercise history pre-cardiac event  DASI (Score):  31.45   DASI METS:  6.61  Work Related Physical Requirements:  retired     Physical Limitations:  yes(Arithtis in neck, back, R. hand, L. knee)    Current Exercise   Regular Exercise:  yes  Where:  Walk(20 min 3xday daily)  Frequency:  7 days/week  Minutes per day:  40:   Strength Training:  No   Adherence:  Does not adhere to strength exercise recommendations  Education/Intervention:  Develop a home exercise plan, Home Exercise Log, Educate on RPE, THR, warm up/cool down, Educate on signs/symptoms of cardiovascular compromise, Self monitoring using pulse taking, heart rate monitor, activity tracker, Progress time and intensity when a steady state of HR and RPE occur  Goals:  Increase in METS by at least 40%, Cardiovascular exercise 30-50 minutes 5x week, Strength exercise 2 -3 days per week, Avoid inactivity   Goal(s) Status:  Goal partially met   Comment:  Pt reports walking dog 3 times a day for 20 min each. He also states he uses his stationary bicycle for 10-20 min 2-3 times a week. (30-day ITP done in absence; pt on medical hold)  Current Peak MET Level:  3.7(NS)  Session #3 MET Level:     Peak MET Goal:     MET Level Percentage Increase:       Exercise  Prescription/Plan  Frequency:  Cardiac Rehab 2 days/week  THR:  RHR + 15-30   RPE: 11-16:  11-14  METS:  2-4 as tol    Time:  30-39 minutes  Mode:  Treadmill, Bike, Nu Step, Elliptical, Walking  Strength Training:  Begin when cleared by CR clinician  Comment:  Pt will attend CR 2-3 times out of the week working within a MET range of 2-4 as tol and a RPE range of 11-14. Pt will follow a gradual progression model.    Nutrition  Assessment:     Height:  177.8 cm (5\' 10" )  Weight (lbs):  205.Marland Kitchen3b(pt stated )  End of Program Weight Goal:     BMI (calculated):  29.4   Waist Circumference (inches):     Body Composition:  Overweight BMI 25-29.9  Rate Your Plate:  61  Current Eating Plan:  Unspecified nutrition plan, Other  Hydration:  Water, Coffee, Beer  Fluid Restriction:  No fluid restriction  Education/Intervention:  Modify caloric intake per RD recommendations, Modify nutrient intake per RD recommentations, Increase exercise to 30-50 minutes a day, RD consult for nutrition recommendations, Hydration guidelines, RYP score <58 meet with RD  Dietitian Consult Date:  01/11/20  Consult Status:  Consult not yet scheduled  Food Log:  Provided  Goals:  BMI 18.5-24.9, Waist (men) <40 inches, (women) <  35 inches, Decrease body weight, Modify caloric intake, Modify nutrient intake, Heart healthy eating   Goal(s) Status:  Not applicable on initial assessment   Comment:  Pt report that he eats three meals a day. For breakfast, the pt reported having bacon, eggs, [place] sausage, and tomatoes. For lunch, the pt reported having cereal with fruit. For dinnerl, the pt reported having whole wheat noodles, chicken, veggies. Pt reported that he drinks 3 beers in a week. Pt notes that he faces challenges with portioning his food.Pt also noted that he has lost a total of 7 lbs.(30-day ITP done in absence; pt on medical hold)    Dietitian Consult Completed  Diagnosis 1 (Intake):      Diagnosis 2 (Clinical):       Diagnosis 3 (Behavioral):      Additional Nutrition Dx:     Nutrition Plan:       Nutrition Rx  Daily Calorie Goal:     Total Fat g/day:       Protein g/day:     Estimated Carbohydrate Needs:     Cholesterol mg/day:     Sodium mg/day:     Saturated fat g/day:     Fiber g/day:       Diabetes Meal Planning  CHO per meal:     CHO per snack:     Nutrition Follow Up:     Nutrition Goal 1:     Nutritional Goal 1 Status:      Nutrition Goal 2:      Nutritional Goal 2 Status:     Comment:  Seeing dietician today      Psychosocial/Stress  Assessment:  Positive coping mechanisms, Adequate support system  Support Assessment:  Patient verbalizes adequate support, Lives with spouse/partner  Barriers to Learning:  None  Barriers to Attendance:  None:   Occupation:  retired   Work Status:  Retired  Initial PHQ-9 Depression Screening Score:  2  Repeat PHQ-9 Depression Screening Score:     Level of Severity PHQ-9:  None-Minimal 0-4  Initial GAD-7 Anxiety Screening Score:  0  Repeat GAD-7 Anxiety Screening Score:  .   Level of Severity GAD-7:  None-Minimal 0-4  Quality of Life Survey:  COOP  Quality of Life Survey Score:  20  Education/Intervention:  Teach and support self-help strategies, Stress management/relaxation handouts, Medication adherence, Regular physical activity/exercise, Assist patient with identifying stressors, Educate on positive coping mechanisms   Goals:  Adequate support system, Positive coping mechanisms, Manageable level of psychosocial stress due to anxiety, depression, anger and hostility, Improvement in psychosocial survey score, Improved quality of life  Goal(s) Status:  Goal partially met  Comment:  Pt reports low stress. Things that do cause him stress is his daughter. HE states he "lets it wash out" and he doesn't let things bother him. (30-day ITP done in absence; pt on medical hold)    Medication Compliance  Assessment:  ACE/ARB, Beta Blocker, Statin, Antiplatelet, Oral Hypoglycemic  Education/Intervention:  Medication  reconciliation, Medication handouts, Reinforce medication adherence  Goals:  Daily medication adherence, Patient verbalizes adherence  Goal(s) Status:  Goal partially met  Comment:  Pt is knowledgable(30-day ITP done in absence; pt on medical hold)      Tobacco Cessation:  Assessment:  Former tobacco user (> 6 months)    Tobacco Cessation  Education/Intervention:     Goals:  Remain tobacco free  Goal(s) Status:  Goal met  Comment:  Pt has hx of using tobacco products for 10 years  and quit smoking Jan. 3 rd 2013. Pt notes that he continues to remain tobacoo free,(30-day ITP done in absence; pt on medical hold)  Quit Date:  09/26/78  Packs per day:  1  Number of years of tobacco use:  40    Blood Pressure  Assessment:  Dx HTN   Resting BP Range:  137-174/68-84  Education/Intervention:  Reduce packaged and processed food, Reduce sodium intake, Medication compliance, Daily weights, Obtain home BP monitor, Reinforce stress management techniques, Cardiovascular exercise of 150-300 minutes per week/increase daily activity  Goals:  BP<130/80 for Diabetic guidelines, Sodium intake <2000 mg, BMI 18.5 - 24.9, Exercise 30 minutes 5x week (150 minutes per week), Monitor BP at home  Goal(s) Status:  Goal partially met  Comment:  Pt has new BP wrist monitor and check 2-3x day(30-day ITP done in absence; pt on medical hold)    Diabetes  Assessment:  Type 2 Diabetes  Fasting Blood Sugar Range (mg/dL):  60-454  U9W Collection Date:     A1C%:  6.5  Education/Intervention:  Maintain medication compliance, Educate patient on signs and symptoms on hyper/hypoglycemia, Teach insulin effects of exercise, Dietitian consult for medical nutrition therapy, Teach self monitoring during exercise, Provide diabetes self-management education referral, Instructed on proper foot care  Goals:  HbA1C <7%,  FBS 90-130, Postprandial BS <180, BP <130/80 or within MD recommended, Recognize signs and symptoms, Self monitor blood sugar status and self manage  activities  Goal(s) Status:  Goal partially met  Comment:  Pt states he saw his endocrinologist and she changed his medication. His metformin was discontinued and he was started on Amaryl. He reports checking his blood sugar two times a day. He did not checlk his fasting blood sugar today. States his FBS run 150-210 so he will be reporting that to his endocrinologist.     Lipid Management  Assessment:  HLD   Lipid Collection Date:  08/04/19  Total Cholesterol (mg/dL) (if available):  119  HDL (mg/dL) (if available):  33  LDL (mg/dL) (if available):  83   Triglycerides (mg/dL) (if available):  147  VLDL (mg/dL) (if available):  42  Education/Intervention:  Educate on current lipid guidelines, Reduce saturated fat, increase unsaturated fat, Decrease refined carbohydrates, Consider soluble fiber 10-25 grams per day, Weight management, Increase physical activity  Goals:  Total Chol <200, LDL (CAD) <70, Women HDL >50 mg/dl - Men HDL >82 mg/dl, Triglycerides <956  Goal(s) Status:  Goal partially met   Comment:  Lipid panel was reviewed . pt notes taking lipitor as perscribed. Pt will be meeting with dietician today to discuss healthy eating habits to help lipids.     Patient Stated Goals  Patient Stated Goal 1:  "walk longer distances"  Goal 1 Status:  Goal partially met  Comment:Pt states he feels more energy and does not need afternoon nap    Patient Stated Goal 2:  " getting back to the things that he use to do "(30-day ITP done in absence; pt on medical hold)  Goal 2 Status: Goal partially met   Comment:  Absolutely  Patient Stated Goal 3:  " have the energy and stamina to get back into playing golf "  Goal 3 Status:  Goal partially met  Comment:  LBP is limiting his golf  Patient Stated Goal 4:     Goal 4 Status:      Comment:

## 2020-01-18 ENCOUNTER — Inpatient Hospital Stay
Admission: RE | Admit: 2020-01-18 | Discharge: 2020-01-18 | Disposition: A | Discharge status: Home / Self Care | Payer: Medicare Other | Source: Ambulatory Visit

## 2020-01-18 DIAGNOSIS — Z955 Presence of coronary angioplasty implant and graft: Secondary | ICD-10-CM

## 2020-01-20 ENCOUNTER — Inpatient Hospital Stay
Admission: RE | Admit: 2020-01-20 | Discharge: 2020-01-20 | Disposition: A | Discharge status: Home / Self Care | Payer: Medicare Other | Source: Ambulatory Visit

## 2020-01-20 DIAGNOSIS — Z955 Presence of coronary angioplasty implant and graft: Secondary | ICD-10-CM

## 2020-01-22 ENCOUNTER — Ambulatory Visit: Payer: Medicare Other

## 2020-01-23 ENCOUNTER — Other Ambulatory Visit (INDEPENDENT_AMBULATORY_CARE_PROVIDER_SITE_OTHER): Payer: Self-pay | Admitting: "Endocrinology

## 2020-01-24 ENCOUNTER — Other Ambulatory Visit (INDEPENDENT_AMBULATORY_CARE_PROVIDER_SITE_OTHER): Payer: Self-pay | Admitting: "Endocrinology

## 2020-01-25 ENCOUNTER — Inpatient Hospital Stay
Admission: RE | Admit: 2020-01-25 | Discharge: 2020-01-25 | Disposition: A | Discharge status: Home / Self Care | Payer: Medicare Other | Source: Ambulatory Visit | Attending: Cardiovascular Disease | Admitting: Cardiovascular Disease

## 2020-01-25 DIAGNOSIS — Z955 Presence of coronary angioplasty implant and graft: Secondary | ICD-10-CM

## 2020-01-26 ENCOUNTER — Ambulatory Visit (INDEPENDENT_AMBULATORY_CARE_PROVIDER_SITE_OTHER): Payer: Medicare Other | Admitting: Cardiovascular Disease

## 2020-01-26 ENCOUNTER — Encounter (INDEPENDENT_AMBULATORY_CARE_PROVIDER_SITE_OTHER): Payer: Self-pay | Admitting: Cardiovascular Disease

## 2020-01-26 VITALS — BP 160/84 | HR 60 | Ht 70.0 in | Wt 200.9 lb

## 2020-01-26 DIAGNOSIS — E782 Mixed hyperlipidemia: Secondary | ICD-10-CM

## 2020-01-26 DIAGNOSIS — I2510 Atherosclerotic heart disease of native coronary artery without angina pectoris: Secondary | ICD-10-CM

## 2020-01-26 DIAGNOSIS — I10 Essential (primary) hypertension: Secondary | ICD-10-CM

## 2020-01-26 DIAGNOSIS — I2782 Chronic pulmonary embolism: Secondary | ICD-10-CM

## 2020-01-26 DIAGNOSIS — Z955 Presence of coronary angioplasty implant and graft: Secondary | ICD-10-CM

## 2020-01-26 MED ORDER — ASPIRIN EC 81 MG PO TBEC
81.00 mg | DELAYED_RELEASE_TABLET | Freq: Every day | ORAL | 3 refills | Status: AC
Start: 2020-01-26 — End: 2021-01-25

## 2020-01-26 NOTE — Progress Notes (Signed)
[place] [place] [facility] OFFICE PROGRESS NOTE    [facility] OFFICE -CARDIOLOGY  [address]  [place] [address]  Dept: [phone]  Dept Fax: [phone]       Patient Name: Kent Jordan, Kent Jordan    Date of Visit:  Jan 26, 2020  Date of Birth: Oct 04, 1950  AGE: 69 y.o.  Medical Record #: [id number]  Requesting Physician: Kent Score, MD      [name] presents for scheduled follow up.    To review, he is a 69 y.o. M previously followed by Kent Jordan.  He had a history of progressive dyspnea on exertion 2020.  He follows with pulmonary.  He was ultimately diagnosed with a pulmonary embolus but low burden and normal estimated pulmonary pressures and normal right heart chamber sizes and function.  Symptoms felt out of proportion to the pulmonary embolus.  Kent Jordan arranged him for right and left heart catheterization.  We started with the left heart catheterization and demonstrated high-grade mid circumflex disease.  We ended up placing 2 drug stents to the circumflex.  Required a guide liner.  Somewhat lengthy procedure.  Good result though.  We did not end up doing the right heart catheterization as he had his typical symptoms every time we blow up balloons in the lab.  I did cross the aortic valve for pressures.  No stenosis.  He was discharged on Kent Jordan along with his prior Kent Jordan for the pulmonary embolus.  He is also seeing hematology.  He saw pulmonary in follow-up in Kent Jordan in follow-up with dyspnea on exertion resolved post stent.  He is recently seen hematology as well.  His Kent Jordan has now been discontinued.  He remains just on the Kent Jordan.  He had some spontaneous and other easy bruising on the combination of Kent Jordan and Kent Jordan resolved since stopping the Kent Jordan.  He is participating in cardiac rehab.  Blood pressure remains somewhat high.    EKG:  Not done today.      PAST MEDICAL HISTORY: He has a past medical history of Arrhythmia, Arthritis, Back  pain, Blood disorder (11/2014), Coronary artery disease, Diabetes mellitus (2016), Difficulty in walking(719.7), Elevated liver function tests, Failed back syndrome, Fatty liver, Hemochromatosis, Hemochromatosis (09/2014), High cholesterol, Hyperkalemia, Hypertensive disorder (06/2016), Low back pain, Lung nodule (2012), Neuropathy, Psoriasis, Psoriatic arthritis, S/P insertion of spinal cord stimulator (2015), Sleep apnea, and Type 2 diabetes mellitus, controlled. He has a past surgical history that includes Lumbar fusion (10/2011); Microdiscectomy lumbar (06/2011, 07/2011); Knee arthrocentesis (1983); Abdominal surgery (03/1971); CATARACT EXT.WITH IOL (Bilateral, 2009); Fracture surgery (1993); INSERTION, SPINAL CORD STIMULATOR GENERATOR (N/A, 05/07/2014); Septoplasty (10/05/2015); Skin biopsy (11/2015); ARTHROSCOPY, KNEE (Right, 03/21/2016); ARTHROPLASTY, KNEE, TOTAL (Right, 08/20/2016); Right & Left Heart Cath (N/A, 10/09/2019); ECHOCARDIOGRAM, TRANSTHORACIC; and MPI DUAL ISOTOPE LEXISCAN JANUARY 2017.    ALLERGIES:   Allergies   Allergen Reactions    Angiotensin Receptor Blockers Other (See Comments)     ARB combined with Celebrex caused Arrhythmia due to high K+    Ciprofloxacin Swelling and Anaphylaxis     swelling    Ace Inhibitors Angioedema    Gabapentin Other (See Comments)     blurred vision, dizziness    Valsartan Angioedema       MEDICATIONS:   Current Outpatient Medications   Medication Sig    amLODIPine (NORVASC) 10 MG tablet Take 10 mg by mouth daily       celecoxib (CeleBREX) 200 MG capsule Take 200 mg by  mouth 2 (two) times daily    clopidogrel (Kent Jordan) 75 mg tablet Take 1 tablet (75 mg total) by mouth daily Maintenance dose    DULoxetine (CYMBALTA) 60 MG capsule Take 60 mg by mouth daily.    folic acid (FOLVITE) 400 MCG tablet Take 400 mcg by mouth daily.    glimepiride (AMARYL) 1 MG tablet TAKE 1 TABLET BY MOUTH EVERY MORNING BEFORE BREAKFAST    hydroCHLOROthiazide (HYDRODIURIL) 25 MG tablet  Take 25 mg by mouth daily.       metoprolol succinate XL (TOPROL-XL) 25 MG 24 hr tablet Take 25 mg by mouth daily       MILK THISTLE PO Take by mouth    Omega-3 Fatty Acids (Omega-3 Fish Oil) 500 MG Cap Take 1,400 mg by mouth daily       pantoprazole (PROTONIX) 20 MG tablet Take 1 tablet (20 mg total) by mouth daily    rosuvastatin (CRESTOR) 5 MG tablet TAKE 1 TABLET BY MOUTH EVERY Enmanuel Zufall    Secukinumab, 300 MG Dose, (Cosentyx, 300 MG Dose,) 150 MG/ML Solution Prefilled Syringe Inject into the skin Once a month on a Thursday at the beginning of the month      traZODone (DESYREL) 50 MG tablet Take 50 mg by mouth nightly as needed.            FAMILY HISTORY: family history includes Heart disease in his father and mother.    SOCIAL HISTORY: He reports that he quit smoking about 8 years ago. He has a 41.00 pack-year smoking history. He has never used smokeless tobacco. He reports current alcohol use of about 2.0 standard drinks of alcohol per week. He reports that he does not use drugs.    PHYSICAL EXAMINATION    There were no vitals taken for this visit.    General Appearance:  A well-appearing male in no acute distress.    Skin: Warm and dry to touch, no apparent skin lesions, or masses noted.  Head: Normocephalic, normal hair pattern, no masses or tenderness   Eyes: EOMS Intact, PERRL, conjunctivae and lids unremarkable.  Funduscopic exam and visual fields not performed.   ENT: Ears, Nose and throat reveal no gross abnormalities.  No pallor or cyanosis.  Dentition good.   Neck: JVP normal, no carotid bruit, thyroid not enlarged   Chest: Clear to auscultation bilaterally with good air movement and respiratory effort and no wheezes, rales, or rhonchi   Cardiovascular: Regular rhythm, S1 normal, S2 normal, No S3 or S4, Apical impulse not displaced, no murmurs, gallops or rubs detected   Abdomen: Soft, nontender, nondistended, with normoactive bowel sounds. No organomegaly.  No pulsatile masses, or bruits.    Extremities: Warm with trace edema.  No clubbing, or cyanosis. All peripheral pulses are full and equal.   Neuro: Alert and oriented x3. No gross motor or sensory deficits noted, affect appropriate.        LABS:     Lab Results   Component Value Date    AST 90 (H) 12/03/2019    ALT 149 (H) 12/03/2019     No results found for: LIPID  Lab Results   Component Value Date    HGBA1C 11.2 (H) 12/03/2019    BNP 77 08/03/2019    TSH 2.09 08/04/2019       IMPRESSION:   1. Dyspnea on exertion 2020.  Pulmonary.  Diagnosed with PE but low burden, normal PA pressures, symptoms felt out of proportion to PE.  Sent on to  cardiac catheterization.  1V CAD mLCX.  DES X 2 (GuideLiner required).  Symptoms with balloon inflations.  We did not do the RHC (not felt required).  Aortic valve crossed.  No AS.  Symptoms resolved posts LCX stenting.  Kent Jordan added to Kent Jordan (avoiding triple therapy).  Cardiac rehab.  2.   H/o HTN.  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  Amlodipine, HCTZ, toprol.  Systolics currently a bit high. Come down after exercise at rehab.    3.   H/o PVC's, bigeminy, wide QRS after prior 2017 outpatient surgery (septoplasty).  Sent to ER.  High K and high creat.  Normal echo and nuclear test at that time.  Beta-blocker.  No further rhythm issues.     5. Diabetes.  Diet, weight loss, oral medications.  Not on ACEi due to h/o angioedema.    6. Chronic back pain.  Has a nerve stimulator in place (no MRI).  PT.    7. Elevated LFT's.  Suspect some fatty liver (2 beers a Shantera Monts).  His primary also indicates hemochromatosis.    8. High cholesterol and CAD.  Tolerating low dose crestor.        RECOMMENDATIONS:    Regarding his coronary artery disease he appears to be doing well with resolution of prior symptoms post stenting.  Given that his Kent Jordan has now been stopped I will have him start taking aspirin 81 mg daily along with his Kent Jordan.  Dual antiplatelet therapy to continue for 1 year.  After that we will stop the Kent Jordan and continue the  aspirin.  Continue statin.  Will review future lipid levels on the low-dose Crestor.  Target LDL would be 70 or below.  If not at target we could add Zetia or consider PCSK9 inhibitors if he does not tolerate higher doses of statin.  Regarding his blood pressure I will let him increase his Toprol to 25 twice daily.  If he tolerates that dose with better control of pressure and no untoward side effects such as bronchospasm wheezing fatigue or low heart rate we could refill next visit at the 50 mg dose.  I will plan to see him back in 6 months.  Continue cardiac rehab.  He will call sooner if cardiac issues arise.  Chest pains or ongoing blood pressure issues.      SIGNED:    Bertram Millard Lummie Montijo, MD          This note was generated by the Dragon speech recognition and may contain errors or omissions not intended by the user. Grammatical errors, random word insertions, deletions, pronoun errors, and incomplete sentences are occasional consequences of this technology due to software limitations. Not all errors are caught or corrected. If there are questions or concerns about the content of this note or information contained within the body of this dictation, they should be addressed directly with the author for clarification.

## 2020-01-27 ENCOUNTER — Inpatient Hospital Stay
Admission: RE | Admit: 2020-01-27 | Discharge: 2020-01-27 | Disposition: A | Discharge status: Home / Self Care | Payer: Medicare Other | Source: Ambulatory Visit

## 2020-01-27 DIAGNOSIS — Z955 Presence of coronary angioplasty implant and graft: Secondary | ICD-10-CM

## 2020-01-27 NOTE — Addendum Note (Signed)
Encounter addended by: Luciano Cutter, RN on: 01/27/2020 2:05 PM   Actions taken: Flowsheet accepted

## 2020-01-29 ENCOUNTER — Ambulatory Visit: Payer: Medicare Other

## 2020-01-30 ENCOUNTER — Encounter (INDEPENDENT_AMBULATORY_CARE_PROVIDER_SITE_OTHER): Payer: Self-pay

## 2020-01-31 ENCOUNTER — Encounter (INDEPENDENT_AMBULATORY_CARE_PROVIDER_SITE_OTHER): Payer: Self-pay

## 2020-02-01 ENCOUNTER — Inpatient Hospital Stay
Admission: RE | Admit: 2020-02-01 | Discharge: 2020-02-01 | Disposition: A | Discharge status: Home / Self Care | Payer: Medicare Other | Source: Ambulatory Visit

## 2020-02-01 DIAGNOSIS — Z955 Presence of coronary angioplasty implant and graft: Secondary | ICD-10-CM

## 2020-02-03 ENCOUNTER — Inpatient Hospital Stay
Admission: RE | Admit: 2020-02-03 | Discharge: 2020-02-03 | Disposition: A | Discharge status: Home / Self Care | Payer: Medicare Other | Source: Ambulatory Visit

## 2020-02-03 DIAGNOSIS — Z955 Presence of coronary angioplasty implant and graft: Secondary | ICD-10-CM

## 2020-02-05 ENCOUNTER — Inpatient Hospital Stay
Admission: RE | Admit: 2020-02-05 | Discharge: 2020-02-05 | Disposition: A | Discharge status: Home / Self Care | Payer: Medicare Other | Source: Ambulatory Visit

## 2020-02-05 DIAGNOSIS — Z955 Presence of coronary angioplasty implant and graft: Secondary | ICD-10-CM

## 2020-02-05 NOTE — Addendum Note (Signed)
Encounter addended by: Luciano Cutter, RN on: 02/05/2020 1:44 PM   Actions taken: Flowsheet accepted

## 2020-02-08 ENCOUNTER — Inpatient Hospital Stay
Admission: RE | Admit: 2020-02-08 | Discharge: 2020-02-08 | Disposition: A | Discharge status: Home / Self Care | Payer: Medicare Other | Source: Ambulatory Visit

## 2020-02-08 DIAGNOSIS — Z955 Presence of coronary angioplasty implant and graft: Secondary | ICD-10-CM

## 2020-02-08 NOTE — Progress Notes (Signed)
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  Pt has fallen 3-4 times within the last year ( no contusion of the head). Pt also noted being diagonised with hemochromatosis (Pt on medical hold due hyperglycemia at rest)    Exercise  Assessment:  Exercise history pre-cardiac event  DASI (Score):  31.45(initial survey)   DASI METS:  6.61  Work Related Physical Requirements:  retired     Physical Limitations:  yes(Arithtis in neck, back, R. hand, L. knee)    Current Exercise   Regular Exercise:  yes  Where:  Cardiac Rehab, Walk, Home Equipment(20 min 3xday daily)  Frequency:  7 days/week  Minutes per day:  20-40:   Strength Training:  No(pt holding wieghts d/t intense pain after using last session)   Adherence:  Does not adhere to strength exercise recommendations, Adheres to cardiovascular exercise guidelines  Education/Intervention:  Develop a home exercise plan, Home Exercise Log, Educate on RPE, THR, warm up/cool down, Educate on signs/symptoms of cardiovascular compromise, Self monitoring using pulse taking, heart rate monitor, activity tracker, Progress time and intensity when a steady state of HR and RPE occur  Goals:  Increase in METS by at least 40%, Cardiovascular exercise 30-50 minutes 5x week, Strength exercise 2 -3 days per week, Avoid inactivity   Goal(s) Status:  Goal partially met   Comment:  Pt reports walking dog 3 times a day for 20 min each. He also states he uses his stationary bike x 20 minutes on non-CR days.  Current Peak MET Level:  2.5  Session #3 MET  Level:  2.8  Peak MET Goal:  3.92  MET Level Percentage Increase:  -10.71    Exercise Prescription/Plan  Frequency:  Cardiac Rehab 2 days/week  THR:  RHR + 15-30   RPE: 11-16:  11-14  METS:  2-4 as tol    Time:  30-39 minutes  Mode:  Bike, Nu Step, Walking  Strength Training:  Begin when cleared by CR clinician(pt was oriented but holding d/t pain in neck and shoulders)  Comment:  Ex Rx/Plan as noted above w/ gradual progression as tolerated.  Pt limited w/ progression d/t neck, back and shoulder chronic pain.    Nutrition  Assessment:     Height:  177.8 cm (5\' 10" )  Weight (lbs):  209.0lb  End of Program Weight Goal:  under 200 lbs.  BMI (calculated):  30   Waist Circumference (inches):     Body Composition:  Overweight BMI 25-29.9  Rate Your Plate:  09([id number] survey score)  Current Eating Plan:  Unspecified nutrition plan, Other  Hydration:  Water, Coffee, Beer  Fluid Restriction:  No fluid restriction  Education/Intervention:  Modify caloric intake per RD recommendations, Modify nutrient intake per RD recommentations, Increase exercise to 30-50 minutes a day, RD consult for nutrition recommendations, Hydration guidelines, RYP score <58 meet with RD  Dietitian Consult Date:  01/11/20  Consult Status:  Completed  Food Log:  Provided  Goals:  BMI 18.5-24.9, Waist (men) <40  inches, (women) <35 inches, Decrease body weight, Modify caloric intake, Modify nutrient intake, Heart healthy eating   Goal(s) Status:  Goal partially met   Comment:  Pt reports following RD recommendations for 45 carbs per meal max.  Has reduced beer consumption to 2 to 3 beers per day.(30-day ITP done in absence; pt on medical hold)    Dietitian Consult Completed  Diagnosis 1 (Intake):      Diagnosis 2 (Clinical):       Diagnosis 3 (Behavioral):     Additional Nutrition Dx:     Nutrition Plan:       Nutrition Rx  Daily Calorie Goal:     Total Fat g/day:       Protein g/day:     Estimated Carbohydrate Needs:     Cholesterol mg/day:     Sodium  mg/day:     Saturated fat g/day:     Fiber g/day:       Diabetes Meal Planning  CHO per meal:     CHO per snack:     Nutrition Follow Up:     Nutrition Goal 1:     Nutritional Goal 1 Status:      Nutrition Goal 2:      Nutritional Goal 2 Status:     Comment:         Psychosocial/Stress  Assessment:  Positive coping mechanisms, Adequate support system  Support Assessment:  Patient verbalizes adequate support, Lives with spouse/partner  Barriers to Learning:  None  Barriers to Attendance:  None:   Occupation:  retired   Work Status:  Retired  Initial PHQ-9 Depression Screening Score:  2  Repeat PHQ-9 Depression Screening Score:     Level of Severity PHQ-9:  None-Minimal 0-4  Initial GAD-7 Anxiety Screening Score:  0  Repeat GAD-7 Anxiety Screening Score:  .   Level of Severity GAD-7:  None-Minimal 0-4  Quality of Life Survey:  COOP  Quality of Life Survey Score:  20  Education/Intervention:  Teach and support self-help strategies, Stress management/relaxation handouts, Medication adherence, Regular physical activity/exercise, Assist patient with identifying stressors, Educate on positive coping mechanisms   Goals:  Adequate support system, Positive coping mechanisms, Manageable level of psychosocial stress due to anxiety, depression, anger and hostility, Improvement in psychosocial survey score, Improved quality of life  Goal(s) Status:  Goal partially met  Comment:  Pt self repors "low" daily stress; notes good support system.    Medication Compliance  Assessment:  Beta Blocker, Statin, Antiplatelet, Oral Hypoglycemic, Diuretic, Calcium Channel Blocker, ASA  Education/Intervention:  Medication reconciliation, Medication handouts, Reinforce medication adherence  Goals:  Daily medication adherence, Patient verbalizes adherence  Goal(s) Status:  Goal met  Comment:  Medication list reconciled in Epic.  Pt states compliance w/ medication regimen.(30-day ITP done in absence; pt on medical hold)      Tobacco  Cessation:  Assessment:  Former tobacco user (> 6 months)    Tobacco Cessation  Education/Intervention:     Goals:  Remain tobacco free  Goal(s) Status:  Goal met  Comment:  Pt has hx of using tobacco products for 10 years and quit smoking Jan. 3 rd 2013. Pt notes that he continues to remain tobacoo free,  Quit Date:  09/26/78  Packs per day:  1  Number of years of tobacco use:  40    Blood Pressure  Assessment:  Dx HTN   Resting BP Range:  130 to 160 sbp/73 to 85 dbp  Education/Intervention:  Reduce packaged  and processed food, Reduce sodium intake, Medication compliance, Daily weights, Obtain home BP monitor, Reinforce stress management techniques, Cardiovascular exercise of 150-300 minutes per week/increase daily activity, Modified DASH diet, MD consult for BP management  Goals:  BP<130/80 for Diabetic guidelines, Sodium intake <2000 mg, BMI 18.5 - 24.9, Exercise 30 minutes 5x week (150 minutes per week), Monitor BP at home, DASH diet  Goal(s) Status:  Goal partially met  Comment:  Pt notes recent increase in his beta blocker to twice daily.  Continue to monitor trend and have pt f/u w/ md prn.(30-day ITP done in absence; pt on medical hold)    Diabetes  Assessment:  Type 2 Diabetes  Fasting Blood Sugar Range (mg/dL):  960 to 454  U9W Collection Date:     A1C%:  7.5(per pt (home kit from insurance competed at home))  Education/Intervention:  Maintain medication compliance, Educate patient on signs and symptoms on hyper/hypoglycemia, Teach insulin effects of exercise, Dietitian consult for medical nutrition therapy, Teach self monitoring during exercise, Provide diabetes self-management education referral, Instructed on proper foot care  Goals:  HbA1C <7%,  FBS 90-130, Postprandial BS <180, BP <130/80 or within MD recommended, Recognize signs and symptoms, Self monitor blood sugar status and self manage activities  Goal(s) Status:  Goal partially met  Comment:  Pt notes recent increase in his HgbA1C to 7.5.  Pt  monitoring carb intake at each meal.  Maxing at 45 carbs.  Normal glycemic response to exercise noted at CR.    Lipid Management  Assessment:  HLD   Lipid Collection Date:  08/04/19  Total Cholesterol (mg/dL) (if available):  119  HDL (mg/dL) (if available):  33  LDL (mg/dL) (if available):  83   Triglycerides (mg/dL) (if available):  147  VLDL (mg/dL) (if available):  42  Education/Intervention:  Educate on current lipid guidelines, Reduce saturated fat, increase unsaturated fat, Decrease refined carbohydrates, Consider soluble fiber 10-25 grams per day, Weight management, Increase physical activity  Goals:  Total Chol <200, LDL (CAD) <70, Women HDL >50 mg/dl - Men HDL >82 mg/dl, Triglycerides <956  Goal(s) Status:  Goal partially met   Comment:  No new lipid profile.  Pt compliant w/ statin medication.    Patient Stated Goals  Patient Stated Goal 1:  "walk longer distances"  Goal 1 Status:  Goal partially met  Comment:Pt limited my chronic back pain.(s/p fusion; wears a neurotransmitor)    Patient Stated Goal 2:  " getting back to the things that he use to do "  Goal 2 Status: Goal partially met   Comment:  No golfing recently d/t back pain.  Patient Stated Goal 3:  " have the energy and stamina to get back into playing golf "  Goal 3 Status:  Goal partially met  Comment:  Remains limited w/ golfing d/t chronic back pain.  Patient Stated Goal 4:     Goal 4 Status:      Comment:

## 2020-02-10 ENCOUNTER — Ambulatory Visit: Payer: Medicare Other

## 2020-02-12 ENCOUNTER — Ambulatory Visit: Payer: Medicare Other

## 2020-02-12 ENCOUNTER — Encounter (INDEPENDENT_AMBULATORY_CARE_PROVIDER_SITE_OTHER): Payer: Self-pay

## 2020-02-15 ENCOUNTER — Inpatient Hospital Stay
Admission: RE | Admit: 2020-02-15 | Discharge: 2020-02-15 | Disposition: A | Discharge status: Home / Self Care | Payer: Medicare Other | Source: Ambulatory Visit

## 2020-02-15 DIAGNOSIS — Z955 Presence of coronary angioplasty implant and graft: Secondary | ICD-10-CM

## 2020-02-17 ENCOUNTER — Inpatient Hospital Stay
Admission: RE | Admit: 2020-02-17 | Discharge: 2020-02-17 | Disposition: A | Discharge status: Home / Self Care | Payer: Medicare Other | Source: Ambulatory Visit

## 2020-02-17 DIAGNOSIS — Z955 Presence of coronary angioplasty implant and graft: Secondary | ICD-10-CM

## 2020-02-18 ENCOUNTER — Other Ambulatory Visit (INDEPENDENT_AMBULATORY_CARE_PROVIDER_SITE_OTHER): Payer: Self-pay | Admitting: Student in an Organized Health Care Education/Training Program

## 2020-02-18 DIAGNOSIS — E782 Mixed hyperlipidemia: Secondary | ICD-10-CM

## 2020-02-19 ENCOUNTER — Inpatient Hospital Stay
Admission: RE | Admit: 2020-02-19 | Discharge: 2020-02-19 | Disposition: A | Discharge status: Home / Self Care | Payer: Medicare Other | Source: Ambulatory Visit

## 2020-02-19 DIAGNOSIS — Z955 Presence of coronary angioplasty implant and graft: Secondary | ICD-10-CM

## 2020-02-24 ENCOUNTER — Encounter (INDEPENDENT_AMBULATORY_CARE_PROVIDER_SITE_OTHER): Payer: Self-pay

## 2020-02-24 ENCOUNTER — Inpatient Hospital Stay
Admission: RE | Admit: 2020-02-24 | Discharge: 2020-02-24 | Disposition: A | Discharge status: Home / Self Care | Payer: Medicare Other | Source: Ambulatory Visit | Attending: Cardiovascular Disease | Admitting: Cardiovascular Disease

## 2020-02-24 DIAGNOSIS — Z955 Presence of coronary angioplasty implant and graft: Secondary | ICD-10-CM | POA: Insufficient documentation

## 2020-02-26 ENCOUNTER — Ambulatory Visit: Payer: Medicare Other

## 2020-02-29 ENCOUNTER — Inpatient Hospital Stay
Admission: RE | Admit: 2020-02-29 | Discharge: 2020-02-29 | Disposition: A | Discharge status: Home / Self Care | Payer: Medicare Other | Source: Ambulatory Visit

## 2020-02-29 DIAGNOSIS — Z955 Presence of coronary angioplasty implant and graft: Secondary | ICD-10-CM

## 2020-03-01 ENCOUNTER — Encounter (INDEPENDENT_AMBULATORY_CARE_PROVIDER_SITE_OTHER): Payer: Self-pay

## 2020-03-02 ENCOUNTER — Encounter (INDEPENDENT_AMBULATORY_CARE_PROVIDER_SITE_OTHER): Payer: Self-pay

## 2020-03-02 ENCOUNTER — Ambulatory Visit: Payer: Medicare Other

## 2020-03-04 ENCOUNTER — Ambulatory Visit: Payer: Medicare Other

## 2020-03-04 ENCOUNTER — Other Ambulatory Visit (INDEPENDENT_AMBULATORY_CARE_PROVIDER_SITE_OTHER): Payer: Self-pay

## 2020-03-04 DIAGNOSIS — I10 Essential (primary) hypertension: Secondary | ICD-10-CM

## 2020-03-04 MED ORDER — METOPROLOL SUCCINATE ER 25 MG PO TB24
25.00 mg | ORAL_TABLET | Freq: Two times a day (BID) | ORAL | 3 refills | Status: AC
Start: 2020-03-04 — End: 2021-02-27

## 2020-03-07 ENCOUNTER — Inpatient Hospital Stay
Admission: RE | Admit: 2020-03-07 | Discharge: 2020-03-07 | Disposition: A | Discharge status: Home / Self Care | Payer: Medicare Other | Source: Ambulatory Visit

## 2020-03-07 DIAGNOSIS — Z955 Presence of coronary angioplasty implant and graft: Secondary | ICD-10-CM

## 2020-03-09 ENCOUNTER — Inpatient Hospital Stay: Admission: RE | Admit: 2020-03-09 | Payer: Medicare Other | Source: Ambulatory Visit

## 2020-03-09 ENCOUNTER — Encounter (INDEPENDENT_AMBULATORY_CARE_PROVIDER_SITE_OTHER): Payer: Self-pay

## 2020-03-09 NOTE — Addendum Note (Signed)
Encounter addended by: Theora Gianotti, PT on: 03/09/2020 3:27 PM   Actions taken: Flowsheet data copied forward, Flowsheet accepted, Clinical Note Signed, Home Medications modified, Medication taking status modified, Order Reconciliation Section accessed, Medication List reviewed, Allergies reviewed, Letter saved

## 2020-03-09 NOTE — Progress Notes (Signed)
Cardiac Rehab Individual Treatment Plan    Assessment Period  Phase:  Discharge Assessment (Pt withdrew from the cardiac rehab program 2/2 chronic LBP)  Program:  Illness/Injury/Chronic pain  Session #:  27  First Exercise Session (Date):  10/30/19  Referring Diagnosis:  Stent (DES X 2 to LCx 10/09/2019)  Date of Event:  10/09/19  Prior Cardiac Hx:  Other (HTN, HLD, )  Risk Stratification:  Moderate risk  Ejection Fraction:  55-60%  Comment:  ITP done in absence, pt withdrew from the cardiac rehab program 2/2 chronic LBP.    Exercise  Assessment:  Exercise history pre-cardiac event  DASI (Score):  31.45 (initial survey)   DASI METS:  6.61  Work Related Physical Requirements:  retired     Physical Limitations:  yes (Arithtis in neck, back, R. hand, L. knee)    Current Exercise   Regular Exercise:  yes  Where:  Cardiac Rehab, Walk, Home Equipment (20 min 3xday daily)  Frequency:  7 days/week  Minutes per day:  20-40:   Strength Training:  No (pt holding wieghts d/t intense pain after using last session)   Adherence:  Adheres to cardiovascular exercise guidelines, Does not adhere to strength exercise recommendations  Education/Intervention:  Develop a home exercise plan, Home Exercise Log, Educate on RPE, THR, warm up/cool down, Educate on signs/symptoms of cardiovascular compromise, Self monitoring using pulse taking, heart rate monitor, activity tracker, Progress time and intensity when a steady state of HR and RPE occur  Goals:  Increase in METS by at least 40%, Cardiovascular exercise 30-50 minutes 5x week, Strength exercise 2 -3 days per week, Avoid inactivity   Goal(s) Status:  Goal partially met   Comment:  ITP done in absence, pt withdrew from the cardiac rehab program 2/2 chronic LBP.  Current Peak MET Level:  3 (Nu-Step Session#15)  Session #3 MET Level:  2.8 (Nu-Step)  Peak MET Goal:  3.92  MET Level Percentage Increase:  7.14    Exercise Prescription/Plan  Frequency:  Cardiac Rehab 2 days/week  THR:  RHR +  15-30   RPE: 11-16:  11-14  METS:  2-4 as tol    Time:  30-39 minutes  Mode:  Bike, Nu Step, Walking  Strength Training:  Begin when cleared by CR clinician  Comment:  ITP done in absence, pt withdrew from the cardiac rehab program 2/2 chronic LBP.    Nutrition  Assessment:     Height:  177.8 cm (5\' 10" )  Weight (lbs):  207.1  End of Program Weight Goal:  under 200 lbs.  BMI (calculated):  29.7   Waist Circumference (inches):     Body Composition:  Overweight BMI 25-29.9  Rate Your Plate:  61 (initial survey score)  Current Eating Plan:  Unspecified nutrition plan, Other  Hydration:  Water, Coffee, Beer  Fluid Restriction:  No fluid restriction  Education/Intervention:  Modify caloric intake per RD recommendations, Modify nutrient intake per RD recommentations, Increase exercise to 30-50 minutes a day, RD consult for nutrition recommendations, Hydration guidelines, RYP score <58 meet with RD  Dietitian Consult Date:  01/11/20  Consult Status:  Completed  Food Log:  Provided  Goals:  BMI 18.5-24.9, Waist (men) <40 inches, (women) <35 inches, Decrease body weight, Modify caloric intake, Modify nutrient intake, Heart healthy eating   Goal(s) Status:  Goal partially met   Comment:  Pt reports following RD recommendations for 45 carbs per meal max.  Has reduced beer consumption to 2 to 3 beers per  day. (30-day ITP done in absence; pt on medical hold)    Dietitian Consult Completed  Diagnosis 1 (Intake):      Diagnosis 2 (Clinical):       Diagnosis 3 (Behavioral):     Additional Nutrition Dx:     Nutrition Plan:       Nutrition Rx  Daily Calorie Goal:     Total Fat g/day:       Protein g/day:     Estimated Carbohydrate Needs:     Cholesterol mg/day:     Sodium mg/day:     Saturated fat g/day:     Fiber g/day:       Diabetes Meal Planning  CHO per meal:     CHO per snack:     Nutrition Follow Up:     Nutrition Goal 1:     Nutritional Goal 1 Status:      Nutrition Goal 2:      Nutritional Goal 2 Status:     Comment:  ITP  done in absence, pt withdrew from the cardiac rehab program 2/2 chronic LBP.      Psychosocial/Stress  Assessment:  Positive coping mechanisms, Adequate support system  Support Assessment:  Patient verbalizes adequate support, Lives with spouse/partner  Barriers to Learning:  None  Barriers to Attendance:  None:   Occupation:  retired   Work Status:  Retired  Initial PHQ-9 Depression Screening Score:  2  Repeat PHQ-9 Depression Screening Score:     Level of Severity PHQ-9:  None-Minimal 0-4  Initial GAD-7 Anxiety Screening Score:  0  Repeat GAD-7 Anxiety Screening Score:  .   Level of Severity GAD-7:  None-Minimal 0-4  Quality of Life Survey:  COOP  Quality of Life Survey Score:  20  Education/Intervention:  Teach and support self-help strategies, Stress management/relaxation handouts, Medication adherence, Regular physical activity/exercise, Assist patient with identifying stressors, Educate on positive coping mechanisms   Goals:  Adequate support system, Positive coping mechanisms, Manageable level of psychosocial stress due to anxiety, depression, anger and hostility, Improvement in psychosocial survey score, Improved quality of life  Goal(s) Status:  Goal partially met  Comment:  ITP done in absence, pt withdrew from the cardiac rehab program 2/2 chronic LBP.    Medication Compliance  Assessment:  Beta Blocker, Statin, Antiplatelet, Oral Hypoglycemic, Diuretic, Calcium Channel Blocker, ASA  Education/Intervention:  Medication reconciliation, Medication handouts, Reinforce medication adherence  Goals:  Daily medication adherence, Patient verbalizes adherence  Goal(s) Status:  Goal met  Comment:  Medication list reconciled in Epic.  Pt states compliance w/ medication regimen. (30-day ITP done in absence; pt on medical hold)      Tobacco Cessation:  Assessment:  Former tobacco user (> 6 months)    Tobacco Cessation  Education/Intervention:     Goals:  Remain tobacco free  Goal(s) Status:  Goal met  Comment:  Pt has  hx of using tobacco products for 10 years and quit smoking Jan. 3 rd 2013. Pt notes that he continues to remain tobacoo free,  Quit Date:  09/26/78  Packs per day:  1  Number of years of tobacco use:  40    Blood Pressure  Assessment:  Dx HTN   Resting BP Range:  130-156 sbp/60-76 dbp  Education/Intervention:  Reduce packaged and processed food, Reduce sodium intake, Medication compliance, Daily weights, Obtain home BP monitor, Reinforce stress management techniques, Cardiovascular exercise of 150-300 minutes per week/increase daily activity, Modified DASH diet, MD consult for BP management  Goals:  BP<130/80 for Diabetic guidelines, Sodium intake <2000 mg, BMI 18.5 - 24.9, Exercise 30 minutes 5x week (150 minutes per week), Monitor BP at home, DASH diet  Goal(s) Status:  Goal partially met  Comment:  Pt notes recent increase in his beta blocker to twice daily.  Continue to monitor trend and have pt f/u w/ md prn. (30-day ITP done in absence; pt on medical hold)    Diabetes  Assessment:  Type 2 Diabetes  Fasting Blood Sugar Range (mg/dL):  725 to 366  Y4I Collection Date:     A1C%:  7.5 (per pt (home kit from insurance competed at home))  Education/Intervention:  Maintain medication compliance, Educate patient on signs and symptoms on hyper/hypoglycemia, Teach insulin effects of exercise, Dietitian consult for medical nutrition therapy, Teach self monitoring during exercise, Provide diabetes self-management education referral, Instructed on proper foot care  Goals:  HbA1C <7%,  FBS 90-130, Postprandial BS <180, BP <130/80 or within MD recommended, Recognize signs and symptoms, Self monitor blood sugar status and self manage activities  Goal(s) Status:  Goal partially met  Comment:  Pt notes recent increase in his HgbA1C to 7.5.  Pt monitoring carb intake at each meal.  Maxing at 45 carbs.  Normal glycemic response to exercise noted at CR.    Lipid Management  Assessment:  HLD   Lipid Collection Date:   08/04/19  Total Cholesterol (mg/dL) (if available):  347  HDL (mg/dL) (if available):  33  LDL (mg/dL) (if available):  83   Triglycerides (mg/dL) (if available):  425  VLDL (mg/dL) (if available):  42  Education/Intervention:  Educate on current lipid guidelines, Reduce saturated fat, increase unsaturated fat, Decrease refined carbohydrates, Consider soluble fiber 10-25 grams per day, Weight management, Increase physical activity  Goals:  Total Chol <200, LDL (CAD) <70, Women HDL >50 mg/dl - Men HDL >95 mg/dl, Triglycerides <638  Goal(s) Status:  Goal partially met   Comment:  No new lipid profile.  Pt compliant w/ statin medication.    Patient Stated Goals  Patient Stated Goal 1:  "walk longer distances"  Goal 1 Status:  Goal partially met  Comment:ITP done in absence, pt withdrew from the cardiac rehab program 2/2 chronic LBP.    Patient Stated Goal 2:  " getting back to the things that he use to do "  Goal 2 Status: Goal partially met   Comment:  ITP done in absence, pt withdrew from the cardiac rehab program 2/2 chronic LBP.  Patient Stated Goal 3:  " have the energy and stamina to get back into playing golf "  Goal 3 Status:  Goal partially met  Comment:  ITP done in absence, pt withdrew from the cardiac rehab program 2/2 chronic LBP.  Patient Stated Goal 4:     Goal 4 Status:      Comment:               Carney Living, PT, DPT  Physical Therapist  Physical Medicine & Rehabilitation  [phone]  Mon-Thur 7-5:30pm  03/09/2020 3:06 PM

## 2020-03-09 NOTE — Addendum Note (Signed)
Encounter addended by: Theora Gianotti, PT on: 03/09/2020 1:20 PM   Actions taken: Flowsheet data copied forward, Flowsheet accepted

## 2020-03-11 ENCOUNTER — Ambulatory Visit: Payer: Medicare Other

## 2020-03-22 ENCOUNTER — Other Ambulatory Visit (INDEPENDENT_AMBULATORY_CARE_PROVIDER_SITE_OTHER): Payer: Self-pay | Admitting: Specialist

## 2020-03-22 ENCOUNTER — Encounter (INDEPENDENT_AMBULATORY_CARE_PROVIDER_SITE_OTHER): Payer: Self-pay

## 2020-03-22 ENCOUNTER — Other Ambulatory Visit (INDEPENDENT_AMBULATORY_CARE_PROVIDER_SITE_OTHER): Payer: Self-pay | Admitting: "Endocrinology

## 2020-03-22 MED ORDER — GLIMEPIRIDE 1 MG PO TABS
3.0000 mg | ORAL_TABLET | Freq: Every morning | ORAL | 0 refills | Status: DC
Start: 2020-03-22 — End: 2020-04-28

## 2020-03-22 NOTE — Telephone Encounter (Signed)
Last refill approved. Due for appointment prior to further refills. Please inform patient.

## 2020-03-22 NOTE — Telephone Encounter (Signed)
Pt notified via My Chart

## 2020-03-31 ENCOUNTER — Encounter (INDEPENDENT_AMBULATORY_CARE_PROVIDER_SITE_OTHER): Payer: Self-pay

## 2020-04-01 ENCOUNTER — Encounter (INDEPENDENT_AMBULATORY_CARE_PROVIDER_SITE_OTHER): Payer: Self-pay

## 2020-04-04 ENCOUNTER — Other Ambulatory Visit (INDEPENDENT_AMBULATORY_CARE_PROVIDER_SITE_OTHER): Payer: Self-pay | Admitting: "Endocrinology

## 2020-04-04 ENCOUNTER — Other Ambulatory Visit: Payer: Self-pay | Admitting: Registered Nurse

## 2020-04-04 DIAGNOSIS — E119 Type 2 diabetes mellitus without complications: Secondary | ICD-10-CM

## 2020-04-04 DIAGNOSIS — M5020 Other cervical disc displacement, unspecified cervical region: Secondary | ICD-10-CM

## 2020-04-04 DIAGNOSIS — R7401 Elevation of levels of liver transaminase levels: Secondary | ICD-10-CM

## 2020-04-04 DIAGNOSIS — E782 Mixed hyperlipidemia: Secondary | ICD-10-CM

## 2020-04-05 ENCOUNTER — Other Ambulatory Visit (FREE_STANDING_LABORATORY_FACILITY): Payer: Medicare Other

## 2020-04-05 ENCOUNTER — Telehealth (INDEPENDENT_AMBULATORY_CARE_PROVIDER_SITE_OTHER): Payer: Self-pay

## 2020-04-05 ENCOUNTER — Encounter (INDEPENDENT_AMBULATORY_CARE_PROVIDER_SITE_OTHER): Payer: Self-pay

## 2020-04-05 DIAGNOSIS — E119 Type 2 diabetes mellitus without complications: Secondary | ICD-10-CM

## 2020-04-05 DIAGNOSIS — R7401 Elevation of levels of liver transaminase levels: Secondary | ICD-10-CM

## 2020-04-05 DIAGNOSIS — E782 Mixed hyperlipidemia: Secondary | ICD-10-CM

## 2020-04-05 LAB — CBC AND DIFFERENTIAL
Absolute NRBC: 0 10*3/uL (ref 0.00–0.00)
Basophils Absolute Automated: 0.06 10*3/uL (ref 0.00–0.08)
Basophils Automated: 0.9 %
Eosinophils Absolute Automated: 0.23 10*3/uL (ref 0.00–0.44)
Eosinophils Automated: 3.3 %
Hematocrit: 44.7 % (ref 37.6–49.6)
Hgb: 14.7 g/dL (ref 12.5–17.1)
Immature Granulocytes Absolute: 0.06 10*3/uL (ref 0.00–0.07)
Immature Granulocytes: 0.9 %
Lymphocytes Absolute Automated: 2.17 10*3/uL (ref 0.42–3.22)
Lymphocytes Automated: 31.2 %
MCH: 30.2 pg (ref 25.1–33.5)
MCHC: 32.9 g/dL (ref 31.5–35.8)
MCV: 91.8 fL (ref 78.0–96.0)
MPV: 9.9 fL (ref 8.9–12.5)
Monocytes Absolute Automated: 0.82 10*3/uL (ref 0.21–0.85)
Monocytes: 11.8 %
Neutrophils Absolute: 3.61 10*3/uL (ref 1.10–6.33)
Neutrophils: 51.9 %
Nucleated RBC: 0 /100 WBC (ref 0.0–0.0)
Platelets: 249 10*3/uL (ref 142–346)
RBC: 4.87 10*6/uL (ref 4.20–5.90)
RDW: 15 % (ref 11–15)
WBC: 6.95 10*3/uL (ref 3.10–9.50)

## 2020-04-05 LAB — COMPREHENSIVE METABOLIC PANEL
ALT: 99 U/L — ABNORMAL HIGH (ref 0–55)
AST (SGOT): 82 U/L — ABNORMAL HIGH (ref 5–34)
Albumin/Globulin Ratio: 1.2 (ref 0.9–2.2)
Albumin: 4 g/dL (ref 3.5–5.0)
Alkaline Phosphatase: 120 U/L — ABNORMAL HIGH (ref 38–106)
Anion Gap: 14 (ref 5.0–15.0)
BUN: 16 mg/dL (ref 9.0–28.0)
Bilirubin, Total: 0.5 mg/dL (ref 0.2–1.2)
CO2: 23 mEq/L (ref 21–29)
Calcium: 9.5 mg/dL (ref 8.5–10.5)
Chloride: 104 mEq/L (ref 100–111)
Creatinine: 1 mg/dL (ref 0.5–1.5)
Globulin: 3.3 g/dL (ref 2.0–3.7)
Glucose: 129 mg/dL — ABNORMAL HIGH (ref 70–100)
Potassium: 4.4 mEq/L (ref 3.5–5.1)
Protein, Total: 7.3 g/dL (ref 6.0–8.3)
Sodium: 141 mEq/L (ref 136–145)

## 2020-04-05 LAB — LIPID PANEL
Cholesterol / HDL Ratio: 4.7
Cholesterol: 227 mg/dL — ABNORMAL HIGH (ref 0–199)
HDL: 48 mg/dL (ref 40–9999)
LDL Calculated: 133 mg/dL — ABNORMAL HIGH (ref 0–99)
Triglycerides: 228 mg/dL — ABNORMAL HIGH (ref 34–149)
VLDL Calculated: 46 mg/dL — ABNORMAL HIGH (ref 10–40)

## 2020-04-05 LAB — HEMOGLOBIN A1C
Average Estimated Glucose: 137 mg/dL
Hemoglobin A1C: 6.4 % — ABNORMAL HIGH (ref 4.6–5.9)

## 2020-04-05 LAB — HEMOLYSIS INDEX: Hemolysis Index: 5 (ref 0–18)

## 2020-04-05 LAB — MICROALBUMIN, RANDOM URINE
Urine Creatinine, Random: 164.3 mg/dL
Urine Microalbumin, Random: 18 (ref 0.0–30.0)
Urine Microalbumin/Creatinine Ratio: 11 ug/mg (ref 0–30)

## 2020-04-05 LAB — GFR: EGFR: >60

## 2020-04-05 NOTE — Progress Notes (Signed)
Day, Bertram Millard, MD  Claudean Kinds, MA  He can hold the plavix for a week and resume again right after that as long as he is able to continue the aspirin without interruption. If he has been to hold both, please let me know as that would be a problem. He had a stent back in Jan so we are at 6 months (holding plavix short term okay). Please let him know. SD       [facility].

## 2020-04-05 NOTE — Progress Notes (Signed)
[facility] requesting to hold plavix for 7 days prior to interventional pain management. Form scanned into Epic, will message Dr. Morrie Sheldon to ask if okay to hold. Form under media.

## 2020-04-05 NOTE — Telephone Encounter (Addendum)
I think he is on ASA 81 and plavix 75 and now the celebrex.  If so, and bruising easily, stop the plavix.  Continue the ASA and celebrex.  If he is NOT taking the baby ASA (just the plavix and celebrex (and bruising easily), then stop the plavix and switch to baby ASA 81.    Please call him and let him know and cc me so I know you got this.     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 His pharmacist  Asked he check w/ SD as pt is on Plavix as well . Explained that plavix being an antiplt med and celebrex holding similar properties can cause increase in bleeding / bruising easily . Pt did confirm he is bruising  More easily now. Will send message to SD that pt on both meds , any recommendations ? Will contact pt after hearing back from SD//dg

## 2020-04-06 ENCOUNTER — Encounter (INDEPENDENT_AMBULATORY_CARE_PROVIDER_SITE_OTHER): Payer: Self-pay

## 2020-04-07 ENCOUNTER — Ambulatory Visit
Admission: RE | Admit: 2020-04-07 | Discharge: 2020-04-07 | Disposition: A | Discharge status: Home / Self Care | Payer: Medicare Other | Source: Ambulatory Visit | Attending: Registered Nurse | Admitting: Registered Nurse

## 2020-04-07 DIAGNOSIS — M47892 Other spondylosis, cervical region: Secondary | ICD-10-CM | POA: Insufficient documentation

## 2020-04-07 DIAGNOSIS — M4802 Spinal stenosis, cervical region: Secondary | ICD-10-CM | POA: Insufficient documentation

## 2020-04-07 DIAGNOSIS — M5020 Other cervical disc displacement, unspecified cervical region: Secondary | ICD-10-CM

## 2020-04-11 ENCOUNTER — Encounter (INDEPENDENT_AMBULATORY_CARE_PROVIDER_SITE_OTHER): Payer: Medicare Other | Admitting: "Endocrinology

## 2020-04-11 ENCOUNTER — Encounter (INDEPENDENT_AMBULATORY_CARE_PROVIDER_SITE_OTHER): Payer: Self-pay | Admitting: Student in an Organized Health Care Education/Training Program

## 2020-04-11 ENCOUNTER — Telehealth (INDEPENDENT_AMBULATORY_CARE_PROVIDER_SITE_OTHER): Payer: Medicare Other | Admitting: Student in an Organized Health Care Education/Training Program

## 2020-04-11 ENCOUNTER — Telehealth (INDEPENDENT_AMBULATORY_CARE_PROVIDER_SITE_OTHER): Payer: Self-pay | Admitting: "Endocrinology

## 2020-04-11 ENCOUNTER — Encounter (INDEPENDENT_AMBULATORY_CARE_PROVIDER_SITE_OTHER): Payer: Self-pay | Admitting: "Endocrinology

## 2020-04-11 DIAGNOSIS — E782 Mixed hyperlipidemia: Secondary | ICD-10-CM

## 2020-04-11 DIAGNOSIS — E119 Type 2 diabetes mellitus without complications: Secondary | ICD-10-CM

## 2020-04-11 DIAGNOSIS — R7989 Other specified abnormal findings of blood chemistry: Secondary | ICD-10-CM

## 2020-04-11 MED ORDER — ROSUVASTATIN CALCIUM 10 MG PO TABS
10.00 mg | ORAL_TABLET | Freq: Every day | ORAL | 1 refills | Status: AC
Start: 2020-04-11 — End: ?

## 2020-04-11 NOTE — Progress Notes (Signed)
Patient ID: [name]  is a 69 y.o.  male.     Verbal consent has been obtained from the patient to conduct a video visit encounter to minimize exposure to COVID-19:  Yes     Chief Complaint   Patient presents with    Hyperlipidemia    Hypertension      HPI    Problem   Elevated Lfts    Sees GI and hematology, h/o hemochromatosis, was on Zetia+Crestor, stopped Zetia, avoid EtOH, negative hepatitis screen    Lab Results   Component Value Date    ALT 99 (H) 04/05/2020    AST 82 (H) 04/05/2020    GGT 157 (H) 03/12/2018    ALKPHOS 120 (H) 04/05/2020    BILITOTAL 0.5 04/05/2020         Type 2 Diabetes Mellitus Without Complication, Without Long-Term Current Use of Insulin    Controlled, sees Endo    Medication(s): Metformin XR 1000 mg twice daily, glimepiride 3 mg daily  Side effects: none  Hypoglycemic episodes: none  Neuropathic Sx: no, follows with podiatry  Eye exam in the past year: Jan 2019, no retinopathy, follows with Dr. Jon Billings  PNA vaccine:~2016  Last urine microalbumin: normal June 2018    Review of labs showed:  Lab Results   Component Value Date    HGBA1C 6.4 (H) 04/05/2020    HGBA1C 11.2 (H) 12/03/2019    HGBA1C 6.5 (H) 08/06/2019          Mixed Hyperlipidemia    LDL goal less than 70  Uncontrolled  Medication(s): crestor 10mg  (increased 04/11/2020 )   Side effects: Crestor causes rash at higher doses  58yr CVD risk:  DM2, HTN, former smoker; statin benefit       Denies CP/dyspnea/dizziness/HA/orthopnea/edema   Counseled on diet and exercise      Review of lipid panel shows:  Lab Results   Component Value Date    CHOL 159 02/26/2019    CHOL 243 (H) 08/18/2018    CHOL 153 02/28/2018     Lab Results   Component Value Date    TRIG 140 02/26/2019    TRIG 167 (H) 08/18/2018    TRIG 95 02/28/2018      Lab Results   Component Value Date    HDL 44 02/26/2019    HDL 45 08/18/2018    HDL 55 02/28/2018     Lab Results   Component Value Date    LDL 87 02/26/2019    LDL 165 (H) 08/18/2018    LDL 79 02/28/2018      Lab Results   Component Value Date    ALT 99 (H) 04/05/2020    AST 82 (H) 04/05/2020    GGT 157 (H) 03/12/2018    ALKPHOS 120 (H) 04/05/2020    BILITOTAL 0.5 04/05/2020           The following portions of the patient's history were reviewed and updated as appropriate: current medications, allergies, past family history, past medical history, past social history, past surgical history and problem list.     Review of Systems     See HPI    OBJECTIVE     Physical exam limited due to pandemic    There were no vitals taken for this visit.    Physical Exam    GENERAL : alert and in no distress   HEENT: ncat  LUNGS: unlabored breathing  PSYCH: Normal mood and affect, linear thought process, appropriate interactions, no  SI/hallucinations      ASSESSMENT      [name] is a 69 y.o. male with   1. Mixed hyperlipidemia  rosuvastatin (CRESTOR) 10 MG tablet   2. Type 2 diabetes mellitus without complication, without long-term current use of insulin     3. Elevated LFTs            PLAN         1. Mixed hyperlipidemia  - Goal LDL < 100, uncontrolled, statin benefit, increase statin, no sfx, encourage diet and exercise , monitor lipid and LFTs  - rosuvastatin (CRESTOR) 10 MG tablet; Take 1 tablet (10 mg total) by mouth daily  Dispense: 90 tablet; Refill: 1    2. Type 2 diabetes mellitus without complication, without long-term current use of insulin  - Goal A1c < 7, controlled, encourage diabetic diet and regular exercise, continue current regimen, Endo following    3. Elevated LFTs  -Avoid hepatotoxins, monitor LFTs with statin increase    Call if symptoms persist, worsen, or change.  Call with updates/questions/concerns.      I spent 25 mins during this encounter discussing/counseling/coordinating care for the following medical problem(s)     Medication list reviewed with patient and updated as indicated.  Risk & Benefits of any new medication(s) were explained to the patient who verbalized understanding & agreed to the  treatment plan.  Patient given a printed copy of AVS. Patient verbalized understanding of instructions given.

## 2020-04-11 NOTE — Telephone Encounter (Signed)
Blood sugar log    7/19 165  7/18 115  7/17 143  7/16 157  7/15 150  7/14 174   7/14 152  7/13 150  7/11  141

## 2020-04-11 NOTE — Progress Notes (Signed)
Have you seen any specialists/other providers since your last visit with [place]?    No    Arm preference verified?   No    The patient is due for eye exam, spirometry, influenza vaccine, shingles vaccine, pneumonia vaccine and [facility]

## 2020-04-28 ENCOUNTER — Other Ambulatory Visit (INDEPENDENT_AMBULATORY_CARE_PROVIDER_SITE_OTHER): Payer: Self-pay | Admitting: "Endocrinology

## 2020-04-29 NOTE — Telephone Encounter (Signed)
Last visit:  03/2020  Last labs:  04/05/2020  Follow Up:      Refills have been requested for the following medications:        glimepiride (AMARYL) 1 MG tablet    Preferred pharmacy:   [id number] - [place], [place] - [phone]

## 2020-04-30 MED ORDER — GLIMEPIRIDE 1 MG PO TABS
3.0000 mg | ORAL_TABLET | Freq: Every morning | ORAL | 0 refills | Status: DC
Start: 2020-04-30 — End: 2020-06-01

## 2020-04-30 NOTE — Telephone Encounter (Signed)
Last refill approved. Due for appointment prior to further refills, OV in 03/2020 was No Show.  Please inform patient and schedule appointment ().

## 2020-05-01 ENCOUNTER — Encounter (INDEPENDENT_AMBULATORY_CARE_PROVIDER_SITE_OTHER): Payer: Self-pay

## 2020-05-02 ENCOUNTER — Encounter (INDEPENDENT_AMBULATORY_CARE_PROVIDER_SITE_OTHER): Payer: Self-pay

## 2020-05-03 ENCOUNTER — Encounter (INDEPENDENT_AMBULATORY_CARE_PROVIDER_SITE_OTHER): Payer: Self-pay

## 2020-05-04 NOTE — Telephone Encounter (Signed)
Need and appointment

## 2020-05-05 NOTE — Telephone Encounter (Signed)
Refill for amaryl sent.  Would suggest patient follow up with PCP/ new endocrinologist in [place].  He should have enough medication until then.

## 2020-05-05 NOTE — Telephone Encounter (Signed)
Called Patient.Patient is moving to [place] on 06/08/20.next appointment is available in October.

## 2020-05-26 ENCOUNTER — Encounter (INDEPENDENT_AMBULATORY_CARE_PROVIDER_SITE_OTHER): Payer: Self-pay | Admitting: Cardiovascular Disease

## 2020-05-26 ENCOUNTER — Ambulatory Visit (INDEPENDENT_AMBULATORY_CARE_PROVIDER_SITE_OTHER): Payer: Medicare Other | Admitting: Cardiovascular Disease

## 2020-05-26 VITALS — BP 180/78 | HR 84 | Ht 70.0 in | Wt 204.0 lb

## 2020-05-26 DIAGNOSIS — Z955 Presence of coronary angioplasty implant and graft: Secondary | ICD-10-CM

## 2020-05-26 DIAGNOSIS — I2510 Atherosclerotic heart disease of native coronary artery without angina pectoris: Secondary | ICD-10-CM

## 2020-05-26 NOTE — Progress Notes (Signed)
[facility] OFF[facility] PROGRESS NOTE    [facility] OFFICE -CARDIOLOGY  [address]  [place] [address]  Dept: [phone]  Dept Fax: [phone]       Patient Name: Kent Jordan, Kent Jordan    Date of Visit:  May 26, 2020  Date of Birth: 1950-12-20  AGE: 69 y.o.  Medical Record #: [id number]  Requesting Physician: Erasmo Score, MD      Kent Jordan presents for scheduled follow up.    To review, Kent Jordan is a 69 y.o. M previously followed by Dr. Hyacinth Meeker.  Kent Jordan had a history of progressive dyspnea on exertion 2020.  Kent Jordan follows with pulmonary.  Kent Jordan was ultimately diagnosed with a pulmonary embolus but low burden and normal estimated pulmonary pressures and normal right heart chamber sizes and function.  Symptoms felt out of proportion to the pulmonary embolus.  Dr. Hyacinth Meeker arranged him for right and left heart catheterization.  We started with the left heart catheterization and demonstrated high-grade mid circumflex disease.  We ended up placing 2 drug stents to the circumflex.  Required a guide liner.  Somewhat lengthy procedure.  Good result though.  We did not end up doing the right heart catheterization as Kent Jordan had his typical symptoms every time we blew up the angioplasty balloons in the lab. Kent Jordan was discharged on Plavix along with his prior Eliquis for the pulmonary embolus.  Kent Jordan is also seeing hematology.  Kent Jordan followed up with pulmonary and with Dr. Hyacinth Meeker after the stent.  Dyspnea on exertion resolved.  His Eliquis has now been discontinued.  Now on ASA along with the plavix.  Cardiac rehab.  BP improved (high in the office but lower at home).  Kent Jordan needed a neck injection.  We held the plavix for a week and resumed after.      Since his last visit here Kent Jordan is doing well.  Kent Jordan is actually getting ready to move to [place].  No chest discomfort.  No congestive symptoms.  Blood pressure high in the office today but lower for him at home.  Generally 135-140/75 at home.    EKG:   Not done today.      PAST MEDICAL HISTORY: Kent Jordan has a past medical history of Arrhythmia, Arthritis, Back pain, Blood disorder (11/2014), Coronary artery disease, Diabetes mellitus (2016), Difficulty in walking(719.7), Elevated liver function tests, Failed back syndrome, Fatty liver, Hemochromatosis, Hemochromatosis (09/2014), High cholesterol, Hyperkalemia, Hypertensive disorder (06/2016), Low back pain, Lung nodule (2012), Neuropathy, Psoriasis, Psoriatic arthritis, S/P insertion of spinal cord stimulator (2015), Sleep apnea, and Type 2 diabetes mellitus, controlled. Kent Jordan has a past surgical history that includes Lumbar fusion (10/2011); Microdiscectomy lumbar (06/2011, 07/2011); Knee arthrocentesis (1983); Abdominal surgery (03/1971); CATARACT EXT.WITH IOL (Bilateral, 2009); Fracture surgery (1993); INSERTION, SPINAL CORD STIMULATOR GENERATOR (N/A, 05/07/2014); Septoplasty (10/05/2015); Skin biopsy (11/2015); ARTHROSCOPY, KNEE (Right, 03/21/2016); ARTHROPLASTY, KNEE, TOTAL (Right, 08/20/2016); Right & Left Heart Cath (N/A, 10/09/2019); ECHOCARDIOGRAM, TRANSTHORACIC; and MPI DUAL ISOTOPE LEXISCAN JANUARY 2017.    ALLERGIES:   Allergies   Allergen Reactions    Angiotensin Receptor Blockers Other (See Comments)     ARB combined with Celebrex caused Arrhythmia due to high K+    Ciprofloxacin Swelling and Anaphylaxis     swelling    Ace Inhibitors Angioedema    Gabapentin Other (See Comments)     blurred vision, dizziness    Valsartan Angioedema       MEDICATIONS:   Current Outpatient Medications   Medication  Sig    amLODIPine (NORVASC) 10 MG tablet Take 10 mg by mouth daily       aspirin 81 MG EC tablet Take 1 tablet (81 mg total) by mouth daily    celecoxib (CeleBREX) 200 MG capsule Take 200 mg by mouth 2 (two) times daily    clopidogrel (PLAVIX) 75 mg tablet Take 1 tablet (75 mg total) by mouth daily Maintenance dose    Cosentyx Sensoready, 300 MG, 150 MG/ML Solution Auto-injector     DULoxetine (CYMBALTA) 60 MG  capsule Take 60 mg by mouth daily.    folic acid (FOLVITE) 400 MCG tablet Take 400 mcg by mouth daily.    glimepiride (AMARYL) 1 MG tablet Take 3 tablets (3 mg total) by mouth every morning before breakfast    hydroCHLOROthiazide (HYDRODIURIL) 25 MG tablet Take 25 mg by mouth daily.       metoprolol succinate XL (TOPROL-XL) 25 MG 24 hr tablet Take 1 tablet (25 mg total) by mouth 2 (two) times daily    MILK THISTLE PO Take by mouth    pantoprazole (PROTONIX) 20 MG tablet Take 1 tablet (20 mg total) by mouth daily    rosuvastatin (CRESTOR) 10 MG tablet Take 1 tablet (10 mg total) by mouth daily (Patient taking differently: Take 5 mg by mouth daily   )    traZODone (DESYREL) 50 MG tablet Take 50 mg by mouth nightly as needed.            FAMILY HISTORY: family history includes Heart disease in his father and mother.    SOCIAL HISTORY: Kent Jordan reports that Kent Jordan quit smoking about 8 years ago. Kent Jordan has a 41.00 pack-year smoking history. Kent Jordan has never used smokeless tobacco. Kent Jordan reports current alcohol use of about 2.0 standard drinks of alcohol per week. Kent Jordan reports that Kent Jordan does not use drugs.    PHYSICAL EXAMINATION    Visit Vitals  BP 180/78 (BP Site: Right arm, Patient Position: Sitting, Cuff Size: Medium)   Pulse 84   Ht 1.778 m (5\' 10" )   Wt 92.5 kg (204 lb)   BMI 29.27 kg/m       General Appearance:  A well-appearing male in no acute distress.    Skin: Warm and dry to touch, no apparent skin lesions, or masses noted.  Head: Normocephalic, normal hair pattern, no masses or tenderness   Eyes: EOMS Intact, PERRL, conjunctivae and lids unremarkable.  Funduscopic exam and visual fields not performed.   ENT: Ears, Nose and throat reveal no gross abnormalities.  No pallor or cyanosis.  Dentition good.   Neck: JVP normal, no carotid bruit, thyroid not enlarged   Chest: Clear to auscultation bilaterally with good air movement and respiratory effort and no wheezes, rales, or rhonchi   Cardiovascular: Regular rhythm, S1 normal,  S2 normal, No S3 or S4, Apical impulse not displaced, no murmurs, gallops or rubs detected   Abdomen: Soft, nontender, nondistended, with normoactive bowel sounds. No organomegaly.  No pulsatile masses, or bruits.   Extremities: Warm with trace edema.  No clubbing, or cyanosis. All peripheral pulses are full and equal.   Neuro: Alert and oriented x3. No gross motor or sensory deficits noted, affect appropriate.        LABS:     Lab Results   Component Value Date    AST 82 (H) 04/05/2020    ALT 99 (H) 04/05/2020     No results found for: LIPID  Lab Results   Component Value  Date    HGBA1C 6.4 (H) 04/05/2020    BNP 77 08/03/2019    TSH 2.09 08/04/2019       IMPRESSION:    Dyspnea on exertion 2020.  Pulmonary.  Diagnosed with PE but low burden, normal PA pressures, symptoms felt out of proportion to PE.  Sent on to cardiac catheterization (Jan 2021).  1V CAD mLCX.  DES X 2 (GuideLiner required - 3.0X16 SYNERGY and 3.0X8 SYNERGY).  Symptoms with balloon inflations.  We did not do the RHC (not felt required).  Symptoms resolved post LCX stenting.  Initial plavix/eliquis.  After eliquis course completed, ASA/plavix.  Cardiac rehab.   H/o HTN.  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  Amlodipine, HCTZ, toprol.  Systolics higher in the office than at home. Come down after exercise at rehab.     H/o PVC's, bigeminy, wide QRS after prior 2017 outpatient surgery (septoplasty).  Sent to ER.  High K and high creat.  Normal echo and nuclear test at that time.  Beta-blocker.  No further rhythm issues.      Diabetes.  Diet, weight loss, oral medications.  Not on ACEi due to h/o angioedema.     Chronic back pain.  Has a nerve stimulator in place (no MRI).  PT.     Elevated LFT's.  Suspect some fatty liver (2 beers a Feleica Fulmore).  His primary also indicates hemochromatosis.     High cholesterol and CAD.  Tolerating low dose crestor.      RECOMMENDATIONS:  Regarding his coronary artery disease Kent Jordan seems to be doing quite well without angina.  Kent Jordan  should remain active.  Kent Jordan should continue aspirin and Plavix for a year post stent but then discontinue the Plavix at the 1 year mark.  That would be January 2022.  Continue statin.  Target LDL is 70 or below.  If not able to reach that target without unacceptable side effects from statin Kent Jordan should be considered for addition of Zetia and potentially a PCSK9 inhibitor.  Kent Jordan should continue to work on blood pressure control with target 130/80 or below.  As Kent Jordan is moving to [place] I will not make regular scheduled follow-up here.  I am available to see him anytime if needed.  Kent Jordan will establish with primary care and cardiology in [place].      SIGNED:    Bertram Millard Terren Haberle, MD          This note was generated by the Dragon speech recognition and may contain errors or omissions not intended by the user. Grammatical errors, random word insertions, deletions, pronoun errors, and incomplete sentences are occasional consequences of this technology due to software limitations. Not all errors are caught or corrected. If there are questions or concerns about the content of this note or information contained within the body of this dictation, they should be addressed directly with the author for clarification.

## 2020-05-27 ENCOUNTER — Other Ambulatory Visit (INDEPENDENT_AMBULATORY_CARE_PROVIDER_SITE_OTHER): Payer: Self-pay | Admitting: "Endocrinology

## 2020-05-27 ENCOUNTER — Other Ambulatory Visit (INDEPENDENT_AMBULATORY_CARE_PROVIDER_SITE_OTHER): Payer: Self-pay | Admitting: Student in an Organized Health Care Education/Training Program

## 2020-05-27 ENCOUNTER — Encounter (INDEPENDENT_AMBULATORY_CARE_PROVIDER_SITE_OTHER): Payer: Self-pay

## 2020-05-27 DIAGNOSIS — E782 Mixed hyperlipidemia: Secondary | ICD-10-CM

## 2020-05-28 ENCOUNTER — Other Ambulatory Visit (INDEPENDENT_AMBULATORY_CARE_PROVIDER_SITE_OTHER): Payer: Self-pay | Admitting: "Endocrinology

## 2020-05-28 DIAGNOSIS — E119 Type 2 diabetes mellitus without complications: Secondary | ICD-10-CM

## 2020-05-31 NOTE — Telephone Encounter (Signed)
Last visit:  03/2020  Last labs:  03/2020  Follow Up:      Refills have been requested for the following medications:        glimepiride (AMARYL) 1 MG tablet     Preferred pharmacy:   [facility] [place] [id number] - [place], [place] - [phone]

## 2020-05-31 NOTE — Telephone Encounter (Addendum)
Last OV: 12/01/19  Next OV: none, will send myChart message.    Please review pended rx, thanks!

## 2020-06-01 ENCOUNTER — Other Ambulatory Visit (INDEPENDENT_AMBULATORY_CARE_PROVIDER_SITE_OTHER): Payer: Self-pay | Admitting: "Endocrinology

## 2020-06-01 ENCOUNTER — Encounter (INDEPENDENT_AMBULATORY_CARE_PROVIDER_SITE_OTHER): Payer: Self-pay

## 2020-06-01 MED ORDER — GLIMEPIRIDE 1 MG PO TABS
3.0000 mg | ORAL_TABLET | Freq: Every morning | ORAL | 0 refills | Status: DC
Start: 2020-06-01 — End: 2020-06-27

## 2020-06-01 NOTE — Telephone Encounter (Signed)
Last visit:  03/2020  Last labs:  03/2020  Follow Up:      Refills have been requested for the following medications:         glimepiride (AMARYL) 1 MG tablet      Preferred pharmacy:   [id number] - [place], [place] - [phone]

## 2020-06-01 NOTE — Telephone Encounter (Signed)
Rx sent.  As per message in August patient is moving to [place] this month and will establish care with new doctor there.

## 2020-06-02 ENCOUNTER — Encounter (INDEPENDENT_AMBULATORY_CARE_PROVIDER_SITE_OTHER): Payer: Self-pay

## 2020-06-21 ENCOUNTER — Other Ambulatory Visit (INDEPENDENT_AMBULATORY_CARE_PROVIDER_SITE_OTHER): Payer: Self-pay | Admitting: Cardiovascular Disease

## 2020-06-26 ENCOUNTER — Encounter (INDEPENDENT_AMBULATORY_CARE_PROVIDER_SITE_OTHER): Payer: Self-pay | Admitting: "Endocrinology

## 2020-06-26 DIAGNOSIS — E119 Type 2 diabetes mellitus without complications: Secondary | ICD-10-CM

## 2020-06-27 MED ORDER — GLIMEPIRIDE 1 MG PO TABS
3.0000 mg | ORAL_TABLET | Freq: Every morning | ORAL | 0 refills | Status: AC
Start: 2020-06-27 — End: 2020-07-27

## 2020-06-27 NOTE — Progress Notes (Addendum)
Last OV: 12/01/19  Next OV: none, pt moved to [facility]    Please review pended rx, thanks!

## 2020-07-01 ENCOUNTER — Encounter (INDEPENDENT_AMBULATORY_CARE_PROVIDER_SITE_OTHER): Payer: Self-pay

## 2020-07-02 ENCOUNTER — Encounter (INDEPENDENT_AMBULATORY_CARE_PROVIDER_SITE_OTHER): Payer: Self-pay

## 2020-08-01 ENCOUNTER — Encounter (INDEPENDENT_AMBULATORY_CARE_PROVIDER_SITE_OTHER): Payer: Self-pay

## 2020-08-02 ENCOUNTER — Encounter (INDEPENDENT_AMBULATORY_CARE_PROVIDER_SITE_OTHER): Payer: Self-pay

## 2020-08-16 ENCOUNTER — Telehealth (INDEPENDENT_AMBULATORY_CARE_PROVIDER_SITE_OTHER): Payer: Self-pay

## 2020-08-16 NOTE — Telephone Encounter (Signed)
lvm to remind pt, that he is over due for his next apt. [facility] 08/16/2020

## 2020-08-17 ENCOUNTER — Telehealth (INDEPENDENT_AMBULATORY_CARE_PROVIDER_SITE_OTHER): Payer: Self-pay

## 2020-08-17 NOTE — Telephone Encounter (Signed)
lvm to remind pt is due for his visit. [facility] 08/17/2020

## 2020-08-24 ENCOUNTER — Telehealth (INDEPENDENT_AMBULATORY_CARE_PROVIDER_SITE_OTHER): Payer: Self-pay

## 2020-08-24 NOTE — Telephone Encounter (Signed)
Pt moved to [place], he no longer use our services. [facility] 08/24/2020

## 2020-08-25 ENCOUNTER — Encounter (INDEPENDENT_AMBULATORY_CARE_PROVIDER_SITE_OTHER): Payer: Self-pay

## 2020-08-25 NOTE — Progress Notes (Signed)
Faxed note for holding plavix to [facility].

## 2020-08-31 ENCOUNTER — Encounter (INDEPENDENT_AMBULATORY_CARE_PROVIDER_SITE_OTHER): Payer: Self-pay

## 2020-09-01 ENCOUNTER — Encounter (INDEPENDENT_AMBULATORY_CARE_PROVIDER_SITE_OTHER): Payer: Self-pay

## 2020-10-01 ENCOUNTER — Encounter (INDEPENDENT_AMBULATORY_CARE_PROVIDER_SITE_OTHER): Payer: Self-pay

## 2020-10-02 ENCOUNTER — Encounter (INDEPENDENT_AMBULATORY_CARE_PROVIDER_SITE_OTHER): Payer: Self-pay

## 2020-10-13 ENCOUNTER — Encounter (INDEPENDENT_AMBULATORY_CARE_PROVIDER_SITE_OTHER): Payer: Self-pay | Admitting: "Endocrinology

## 2020-10-13 NOTE — Progress Notes (Signed)
This encounter was created in error - please disregard.    Items noted as "reviewed" are for administrative purposes only and are not guaranteed by the provider to be accurate on this date.

## 2020-11-01 ENCOUNTER — Encounter (INDEPENDENT_AMBULATORY_CARE_PROVIDER_SITE_OTHER): Payer: Self-pay

## 2020-11-02 ENCOUNTER — Encounter (INDEPENDENT_AMBULATORY_CARE_PROVIDER_SITE_OTHER): Payer: Self-pay

## 2020-11-30 ENCOUNTER — Encounter (INDEPENDENT_AMBULATORY_CARE_PROVIDER_SITE_OTHER): Payer: Self-pay

## 2020-12-05 ENCOUNTER — Other Ambulatory Visit (INDEPENDENT_AMBULATORY_CARE_PROVIDER_SITE_OTHER): Payer: Self-pay | Admitting: Cardiovascular Disease

## 2020-12-27 NOTE — Progress Notes (Signed)
[facility] OFFICE   [address] [place], [place] [address]           [name]    Date of Visit:  10/02/2019  Date of Birth: 19-May-1951  Age: 70 yrs.   Medical Record Number: [id number]  Referring Physician: Arlester Marker MD, SEAN    Kent Jordan returns for scheduled  follow up.    To review, he is a 70 y.o. M with h/o HTN. He was on ACEi, eventually with angioedema, switched to ARB. He was for outpatient septoplasty and had bigeminy and wide QRS noted in recovery. He was sent to [place] with high creat and  K noted. He was treated and released. He had normal echo and thallium testing as part of that episode. He has been back to see Arville Care, NP here. She stopped his ARB and switched him to norvasc. She arranged labs with creat .9 and K 4.4. He noted trace  LE edema only on the norvasc (not bothered by it, not there all the time). HCTZ. He has DM. Treated with weight loss, diet, oral medications. He required R TKR. No perioperative cardiac issues. Futher subsequent high BP. I added metoprolol.      Since our last visit he has been having issues. He began noting progressive dyspnea on exertion. Rapidly became unable to walk his dog. Things got quite a bit worse in November and he went to the hospital. Diagnosed with a very small pulmonary emboli.  Started on anticoagulation. Has had follow-up studies. Nothing seen on the CT scan seems to account for his continued persistent substantial dyspnea. He continues to follow with Dr. 9 in. Blood counts are fine if anything high with the hemogram chroma  ptosis. Certainly not anemic. He has been seen by pulmonary and has no lung diagnosis to account for his dyspnea. Here to review. Lung pressures were normal on hospital echo despite the pulmonary emboli.    EKG last visit: NSR.   __   PAST HISTORY     Past Medical Illnesses: hyperlipidmia, htn, diabetes, chronic back pain, sleep apnea,  hyperkalemia;  Past Cardiac Illnesses: No previous history of cardiac  disease.;  Infectious Diseases: No previous history of significant infectious diseases.; Surgical Procedures: microdiscectomy  lumbar, abdominal hernia repair 2009, back surgery 9/12,11/12,2/13, spinal cord generator 2015, septoplasty 2017; Trauma History: No previous history of  significant trauma.; Cardiology Procedures-Invasive: No previous interventional or invasive cardiology procedures.;  Cardiology Procedures-Noninvasive: Echocardiogram January 2017, MPI Dual Isotope Lexiscan January 2017; Left Ventricular Ejection Fraction : LVEF of 60% documented via nuclear study on 10/18/2015    ALLERGIES    Ace Inhibitors, Angioedema   Ciprofloxacin, Swelling (non-specific)  Gabapentin, Intolerance-dizziness  Valsartan, Hyperkalemia (high serum potassium)  __  MEDICATIONS      1. amlodipine 10 mg tablet, one tablet po daily  2. Aspirin Low Dose 81 mg tablet,delayed release, 1 po qd  3. Celebrex 200 mg capsule, 1 po bid  4. Cosentyx 150 mg/mL subcutaneous syringe, q monthly  5. Crestor 5 mg tablet,  1 po qd  6. cyclobenzaprine 10 mg tablet, PRN  7. Cymbalta 60 mg capsule,delayed release, 1 tablet every July Linam  8. Eliquis 5 mg tablet, 1 po bid  9. folic acid 400 mcg tablet, 1 tablet every Kent Jordan  10. hydrochlorothiazide 25 mg tablet,  1 tablet every Kent Jordan  11. metformin 500 mg tablet, 1 po qd  12. Protonix 20 mg tablet,delayed release, 1 po qd  13. Toprol XL 25 mg  tablet,extended release, 1 po qd  14. trazodone 50 mg tablet, PRN   ___  FAMILY HISTORY  Father -- Coronary Artery Disease   Mother -- Coronary Artery Disease    __  CARDIAC RISK FACTORS      Tobacco Abuse: used to smoke, but quit; Family History of Heart Disease: positive;  Hyperlipidemia: positive; Hypertension: positive;   Diabetes Mellitus: positive; Prior History of Heart Disease: negative;  Obesity: positive; Sedentary Life Style:negative; Age :positive; Menopausal:not applicable  __  SOCIAL HISTORY     Alcohol Use: drinks occasionally 2 per week and beer 2 per  week; Smoking: used to smoke, but quit; Former  smoker [phone]); Diet: Regular diet; Exercise: Exercises  regularly, walking and exercise limited due to back injury;   __  REVIEW OF SYSTEMS    General : Denies recent weight loss, weight gain, fever or chills or change in exercise tolerance.; Integumentary : Denies any change in hair or nails, rashes, or skin lesions.; Eyes: Denies diplopia, glaucoma or visual  field defects.; Ears, Nose, Throat, Mouth: Denies any hearing loss, epistaxis, hoarseness or difficulty  speaking.;Respiratory: Denies dyspnea, cough, wheezing or hemoptysis.;  Cardiovascular: Please review HPI; Abdominal  : Denies ulcer disease, hematochezia or melena.;Musculoskeletal:Denies any venous insufficiency, arthritic  symptoms or back problems.; Neurological : Denies any recurrent strokes, TIA, or seizure disorder.;  Psychiatric: Denies any depression, substance abuse or change in cognitive functions.; Endocrine : Denies any weight change, heat/cold intolerance, polydipsia, or polyuria; Hematologic/Immunologic:  Denies any food allergies, seasonal allergies, bleeding disorders.  __  PHYSICAL EXAMINATION    138/70 Sitting, Left arm, large cuff  130/78  Sitting, Right arm, large cuff    Weight: 201.00 lbs.  Height:  70.00"  BMI: 28.84   Pulse: 68/min.      Constitutional: Well developed, well nourished, in no acute distress Skin: warm and dry to touch  Head: normocephalic Eyes: conjunctivae and lids normal  ENT: nose with swelling s/p septoplasty Neck:  no JVD Chest: clear to auscultation bilaterally Cardiac : Regular rhythm, S1 normal, S2 normal, No S3 or S4, no murmurs, gallops or rubs detected Abdomen: abdomen soft  Peripheral Pulses: pulses full and equal in all extremities Extremities/Back : no edema present Neurological: oriented to time, person and place        IMPRESSIONS:   1. PVC's, bigeminy, wide QRS after recent outpatient surgery (septoplasty) 1/17. Sent to ER. High K and high creat. On  ARB. Since switched to norvasc for BP.   2. Normal echo and thallium  as part of above episode (2017).   3. 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 Had been switched to ARB. Now off both. Prior low heart rate on beta-blocker.   4. H/o HTN. Tolerating norvasc. HCTZ. Some LE edema (mild, not bothering him). Now on low dose beta-blocker.  HR and BP doing well.   5. Diabetes. Diet, weight loss, oral medications.   6. Chronic back pain. Has a nerve stimulator in place (no MRI). PT.   7. Elevated LFT's. Suspect some fatty liver (2 beers a Kent Jordan). His primary also indicates hemochromatosis.    8. Prior statin. Currently LDL very low just on zetia (primary manages).   9. 2020 progressive dyspnea on exertion. Ultimately diagnosed with small PEs. Eliquis since. Dyspnea persists despite low clot burden on CT, normal PA pressures on echo. Not  having chest pains. Did not have COVID (tested in hospital).     PLAN:   Given significant persistent limiting dyspnea seemingly out  of proportion  to the degree of pulmonary emboli or certainly the estimated lung pressures think we need to pursue further testing. I will arrange a right and left heart catheterization possible angioplasty. Certainly progressive dyspnea could be a symptom of obstructive  coronary artery disease. If so we will revascularize as appropriate hoping to mitigate symptoms. If the coronaries do not seem to be the issue will certainly check left ventricular end-diastolic pressure and pulmonary artery pressures. Also cardiac outputs.  Make sure he does not have pulmonary hypertension missed by the echo secondary to potentially chronic thromboembolic events. He will need to hold the Eliquis the Mystique Bjelland prior to the cardiac catheterization. We should be able to resume it again right afterwards.  I will make arrangements for cardiac catheterization over at [facility] with me over the next week.    Karle Starch, MD, [facility]      cc: Erasmo Score MD

## 2020-12-27 NOTE — Progress Notes (Signed)
OFFICE  [address] [place], [place] [address]     [name]    Date of Visit:  10/21/2015  Date of Birth: 03-Aug-1951  Age: 70 yrs.   Medical Record Number: [id number]  __  CURRENT DIAGNOSES     1. 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 Arrhythmia-PVCs asymptomatic, I49.3  3. Dyspnea, Unspecified, R06.00  __  ALLERGIES     Ace Inhibitors, Angioedema  Ciprofloxacin, Swelling (non-specific)  Gabapentin, Intolerance-dizziness  Valsartan, Hyperkalemia (high serum potassium)  __   MEDICATIONS     1. Crestor 5 mg tablet, 1 tablet every day  2. valsartan 80 mg tablet, 1 tablet every day  3. Zetia 10 mg tablet, 1 tablet a day  4. lidocaine 4 % topical patch, as directed   5. Glucotrol 10 mg tablet, 1 tablet every dy  6. folic acid 400 mcg tablet, 1 tablet every day  7. Cymbalta 60 mg capsule,delayed release, 1 tablet every day  8. diazepam 5 mg tablet, 1 to 2 tablets three times a day  9. cyclobenzaprine  10 mg tablet, 1 tablet three times a day  10. Co Q-10 30 mg capsule, 1 every day  11. calcipotriene 0.005 % topical cream, as directed  12. ciclopirox 0.77 % topical cream, as directed  13. clobetasol 0.05 % topical cream, as directed   14. Enbrel 50 mg/mL (0.98 mL) subcutaneous syringe, once per week  15. Aspirin Low Dose 81 mg tablet,delayed release, 1 po qd  16. amlodipine 5 mg tablet, 1 po qd  __  CHIEF COMPLAINT/REASON  FOR VISIT  Hospital follow-up  __  HISTORY OF PRESENT ILLNESS  [name] is a 70 year old  who underwent a septoplasty at the [facility] and postoperatively developed arrhythmia. He had a bigeminal pattern. The underlying rhythm is not clear on this ECG. He had a potassium level of 6. He was seen by Dr. Morrie Sheldon, and the patient's hyperkalemia had  corrected itself. He is here today for followup. He remains on valsartan at this point. He is telling me that back in September of 2016, he had been on benazepril and had angioedema. He is now  presented with hyperkalemia and arrhythmia in the setting  of valsartan. He is here today. He is feeling well. He has recovered from his septoplasty. He denies any dizziness or syncope. He has not had any chest pain, pressure, tightness, burning or squeezing. He had a nuclear stress test in our office that showed  normal perfusion. He had an echocardiogram and Dr. Letitia Neri assures me that it is normal. The patient has not been aware of any palpitations.  __  PAST HISTORY      Past Medical Illnesses: hyperlipidmia, htn, diabetes, chronic back pain, sleep apnea, hyperkalemia;   Past Cardiac Illnesses: No previous history of cardiac disease.; Infectious Diseases: No previous history  of significant infectious diseases.; Surgical Procedures: microdiscectomy lumbar, abdominal hernia repair 2009, back surgery 9/12,11/12,2/13, spinal cord  generator 2015, septoplasty 2017; Trauma History: No previous history of significant trauma.; Cardiology  Procedures-Invasive: No previous interventional or invasive cardiology procedures.; Cardiology Procedures-Noninvasive : Echocardiogram January 2017, MPI Dual Isotope Lexiscan January 2017; Left Ventricular Ejection Fraction: LVEF of 60% documented via nuclear study on  10/18/2015  ___  FAMILY HISTORY   Father -- Coronary Artery Disease  Mother -- Coronary Artery Disease     __  CARDIAC RISK FACTORS     Tobacco Abuse: used to smoke, but quit;  Family History of Heart Disease: positive; Hyperlipidemia: positive;  Hypertension: positive;  Diabetes Mellitus: positive;  Prior History of Heart Disease: negative; Obesity: positive;  Sedentary Life Style:negative; [id number]; Menopausal :not applicable  __  SOCIAL HISTORY    Alcohol Use : drinks occasionally 2 per week and beer 2 per week; Smoking: used to smoke, but quit; Former smoker [phone]);  Diet: Regular diet; Exercise: Exercises regularly, walking and exercise limited due to back injury;    __  REVIEW OF SYSTEMS    General:   Denies recent weight loss, weight gain, fever or chills or change in exercise tolerance.; Integumentary:  Denies any change in hair or nails, rashes, or skin lesions.; Eyes: Denies diplopia, glaucoma or visual field defects.;  Ears, Nose, Throat, Mouth: Denies any hearing loss, epistaxis, hoarseness or difficulty speaking.;Respiratory : Denies dyspnea, cough, wheezing or hemoptysis.; Cardiovascular:  Please review HPI; Abdominal : Denies ulcer disease, hematochezia or melena.; Musculoskeletal:Denies any venous insufficiency, arthritic symptoms or back problems.; Neurological  : Denies any recurrent strokes, TIA, or seizure disorder.; Psychiatric:  Denies any depression, substance abuse or change in cognitive functions.; Endocrine: Denies any weight  change, heat/cold intolerance, polydipsia, or polyuria; Hematologic/Immunologic: Denies any food allergies,  seasonal allergies, bleeding disorders.  __  PHYSICAL EXAMINATION    Vital Signs :  Blood Pressure:  136/82 Sitting, Right arm, regular cuff  136/80 Sitting, Left arm, regular cuff     Weight: 212.00 lbs.  Height: 70"  BMI:  30   Pulse: 81/min.       Constitutional:  Well developed, well nourished, in no acute distress Skin: warm and dry to touch Head : normocephalic Eyes: conjunctivae and lids normal  ENT: nose with swelling s/p septoplasty Neck:  no JVD Chest: clear to auscultation bilaterally Cardiac : Regular rhythm, S1 normal, S2 normal, No S3 or S4, no murmurs, gallops or rubs detected Abdomen: abdomen soft  Peripheral Pulses: pulses full and equal in all extremities Extremities/Back : no edema present Neurological: oriented to time, person and place   __     Medications added today by the physician:  amlodipine 5 mg tablet, 1 po qd, 30    ECG:   ECG  is in normal sinus rhythm.    IMPRESSIONS:   1. Cardiac arrhythmia that was noted in an outpatient setting. He had a bigeminal pattern.  2.  Hyperkalemia. The patient is on valsartan.  3. Status post  septoplasty.  4. History of angioedema with benazepril.  5. Diabetes.  6. Chronic back pain.    PLAN:    At this point, given the fact that the patient had angioedema with ACE inhibitor and now hyperkalemia resulting in an arrhythmia with valsartan, I am going to stop the valsartan. I realize that there is benefit here secondary to the patient's diabetes.  However, in this setting, I am going to stop it for now. I have asked him to get a basic metabolic panel to follow up on his potassium. Additionally, I am going to start amlodipine 5 mg per day for his blood pressure. I will have him back in with Dr.  Morrie Sheldon in a month.    Arville Care, ANP     Tid: [id number]:YQ:    cc: CHRISTINA S. MICHAEL DO    sj  ____________________________   TODAYS ORDERS  12 Lead ECG Today  Diet mgmt edu, guidance and counseling TODAY  Return Visit 15 MIN DR DAY 1 month  Basic Metabolic Panel 1 week

## 2020-12-27 NOTE — Progress Notes (Signed)
[facility] OFFICE  [address] [place], [place] [address]     [name]    Date of Visit:  06/06/2018  Date of Birth: Oct 22, 1950  Age: 70 yrs.   Medical Record Number: [id number]  Referring Physician: Arlester Marker MD, SEAN    Mr. Heinzman returns for scheduled  follow up.    To review, he is a 69 y.o. M with h/o HTN. He was on ACEi, eventually with angioedema, switched to ARB. He was for outpatient septoplasty and had bigeminy and wide QRS noted in recovery. He was sent to [place] with high creat and  K noted. He was treated and released. He had normal echo and thallium testing as part of that episode. He has been back to see Arville Care, NP here. She stopped his ARB and switched him to norvasc. She arranged labs with creat .9 and K 4.4. He notes trace  LE edema only on the norvasc (not bothered by it, not there all the time). He has DM. Treated with weight loss, diet, oral medications. He required R TKR. No perioperative cardiac issues.     Since last visit he is under some stress. His wife fell  on the stairs and broke her ankle. Surgery. His BP has been up (is up here and 150-170 at home). No chest pain, dyspnea, palpitations.     EKG: NSR. Anterior Q waves (no change).   __  PAST  HISTORY     Past Medical Illnesses: hyperlipidmia, htn, diabetes, chronic back pain, sleep apnea,  hyperkalemia;  Past Cardiac Illnesses: No previous history of cardiac disease.;  Infectious Diseases: No previous history of significant infectious diseases.; Surgical Procedures: microdiscectomy  lumbar, abdominal hernia repair 2009, back surgery 9/12,11/12,2/13, spinal cord generator 2015, septoplasty 2017; Trauma History: No previous history of  significant trauma.; Cardiology Procedures-Invasive: No previous interventional or invasive cardiology procedures.;  Cardiology Procedures-Noninvasive: Echocardiogram January 2017, MPI Dual Isotope Lexiscan January 2017; Left Ventricular Ejection Fraction : LVEF of 60%  documented via nuclear study on 10/18/2015    ALLERGIES    Ace Inhibitors, Angioedema   Ciprofloxacin, Swelling (non-specific)  Gabapentin, Intolerance-dizziness  Valsartan, Hyperkalemia (high serum potassium)  __  MEDICATIONS      1. Zetia 10 mg tablet, 1 tablet a Latarsha Zani  2. folic acid 400 mcg tablet, 1 tablet every Arris Meyn  3. Cymbalta 60 mg capsule,delayed release, 1 tablet every Vada Yellen  4. Enbrel 50 mg/mL (0.98 mL) subcutaneous syringe, once per week  5. Aspirin  Low Dose 81 mg tablet,delayed release, 1 po qd  6. metformin 500 mg tablet, 1 po qd  7. cyclobenzaprine 10 mg tablet, PRN  8. trazodone 50 mg tablet, PRN  9. diazepam 5 mg tablet, 1 po qd  10. Celebrex 200 mg capsule, 1 po bid   11. amlodipine 10 mg tablet, one tablet po daily  12. hydrochlorothiazide 25 mg tablet, 1 tablet every Baby Gieger  ___   FAMILY HISTORY  Father -- Coronary Artery Disease   Mother -- Coronary Artery Disease    __  CARDIAC RISK FACTORS      Tobacco Abuse: used to smoke, but quit; Family History of Heart Disease: positive;  Hyperlipidemia: positive; Hypertension: positive;   Diabetes Mellitus: positive; Prior History of Heart Disease: negative;  Obesity: positive; Sedentary Life Style:negative; Age :positive; Menopausal:not applicable  __  SOCIAL HISTORY     Alcohol Use: drinks occasionally 2 per week and beer 2 per week; Smoking: used to smoke, but quit; Former  smoker (  [phone]); Diet: Regular diet; Exercise: Exercises  regularly, walking and exercise limited due to back injury;   __  REVIEW OF SYSTEMS    General : Denies recent weight loss, weight gain, fever or chills or change in exercise tolerance.; Integumentary : Denies any change in hair or nails, rashes, or skin lesions.; Eyes: Denies diplopia, glaucoma or visual  field defects.; Ears, Nose, Throat, Mouth: Denies any hearing loss, epistaxis, hoarseness or difficulty  speaking.;Respiratory: Denies dyspnea, cough, wheezing or hemoptysis.;  Cardiovascular: Please review HPI; Abdominal  :  Denies ulcer disease, hematochezia or melena.;Musculoskeletal:Denies any venous insufficiency, arthritic  symptoms or back problems.; Neurological : Denies any recurrent strokes, TIA, or seizure disorder.;  Psychiatric: Denies any depression, substance abuse or change in cognitive functions.; Endocrine : Denies any weight change, heat/cold intolerance, polydipsia, or polyuria; Hematologic/Immunologic:  Denies any food allergies, seasonal allergies, bleeding disorders.  __  PHYSICAL EXAMINATION    168/72 Sitting, Right arm, large cuff  174/78  Sitting, Left arm, large cuff    Weight: 209.00 lbs.  Height:  70.00"  BMI: 29.99   Pulse: 71/min.      Constitutional: Well developed, well nourished, in no acute distress Skin: warm and dry to touch  Head: normocephalic Eyes: conjunctivae and lids normal  ENT: nose with swelling s/p septoplasty Neck:  no JVD Chest: clear to auscultation bilaterally Cardiac : Regular rhythm, S1 normal, S2 normal, No S3 or S4, no murmurs, gallops or rubs detected Abdomen: abdomen soft  Peripheral Pulses: pulses full and equal in all extremities Extremities/Back : no edema present Neurological: oriented to time, person and place        IMPRESSIONS:   1. PVC's, bigeminy, wide QRS after recent outpatient surgery (septoplasty) 1/17. Sent to ER. High K and high creat. On ARB. Since switched to norvasc for BP.   2. Normal echo and thallium  as part of above episode (2017).   3. 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 Had been switched to ARB. Now off both. Prior low heart rate on beta-blocker.   4. H/o HTN. Tolerating norvasc. HCTZ. Some LE edema (mild, not bothering him).   5. Diabetes. Diet,  weight loss, oral medications.   6. Chronic back pain. Has a nerve stimulator in place (no MRI).   7. Elevated LFT's. Suspect some fatty liver (2 beers a Anayah Arvanitis). His primary also indicates hemochromatosis.   8. Prior statin. Currently LDL very  low just on zetia (primary manages).     PLAN:   Refill norvasc, HCTZ.  Add  toprol. History of low heart rate with beta-blocker in the past  (with syncope) but not sure what dose. Will start very low. Toprol 25 mg tablet. Take 1/2 daily (12.5).  He will call if HR drops on the toprol or if not effective in reducing BP. Could increase to 25 daily if BP remains high with HR okay.  OV  here 6 months.     cc: Erasmo Score MD       Bertram Millard Merrillyn Ackerley, MD, [facility]

## 2020-12-27 NOTE — Progress Notes (Signed)
[facility]  [address] [place], [place] [address]     [name]    Date of Visit:  11/22/2015  Date of Birth: May 12, 1951  Age: 70 yrs.   Medical Record Number: [id number]    Mr. Youngren returns for scheduled follow up.    To review, he is a 70 y.o. M with h/o HTN. He was on ACEi, eventually with angioedema, switched to ARB. He was  for outpatient septoplasty and had bigeminy and wide QRS noted in recovery. He was sent to [place] with high creat and K noted. He was treated and released. He had normal echo and thallium testing as part of that episode. He has been back to see Arville Care, NP here. She stopped his ARB (prior angioedema to ACEi and prior high K to ARB). She replaced with norvasc. She arranged labs with creat .9 and K 4.4.     Since last visit he is doing well. He had f/u for his septoplasty (breathing better,  still some swelling). BP has been fine for him at the [facility] (120/80). He notes trace LE edema only on the norvasc (not bothered by it, not there all the time). He has no complaints. Systolic is up a bit for him in our office today but his back pain is bothering  him.     EKG last visit: NSR.   __  PAST HISTORY     Past Medical Illnesses : hyperlipidmia, htn, diabetes, chronic back pain, sleep apnea, hyperkalemia;  Past Cardiac Illnesses : No previous history of cardiac disease.; Infectious Diseases: No previous history of significant infectious diseases.;  Surgical Procedures: microdiscectomy lumbar, abdominal hernia repair 2009, back surgery 9/12,11/12,2/13, spinal cord generator 2015, septoplasty 2017;  Trauma History: No previous history of significant trauma.; Cardiology Procedures-Invasive: No previous  interventional or invasive cardiology procedures.; Cardiology Procedures-Noninvasive: Echocardiogram January 2017, MPI Dual Isotope Lexiscan January 2017;  Left Ventricular Ejection Fraction: LVEF of 60% documented via nuclear study on 10/18/2015    ALLERGIES      Ace Inhibitors, Angioedema  Ciprofloxacin, Swelling (non-specific)  Gabapentin, Intolerance-dizziness  Valsartan, Hyperkalemia (high serum potassium)  __   MEDICATIONS     1. Crestor 5 mg tablet, 1 tablet every Shirley Decamp  2. Zetia 10 mg tablet, 1 tablet a Geraldin Habermehl  3. lidocaine 4 % topical patch, as directed  4. Glucotrol 10 mg tablet, 1 tablet every  dy  5. folic acid 400 mcg tablet, 1 tablet every Falcon Mccaskey  6. Cymbalta 60 mg capsule,delayed release, 1 tablet every Maryhelen Lindler  7. diazepam 5 mg tablet, 1 to 2 tablets three times a Kerra Guilfoil  8. cyclobenzaprine 10 mg tablet, 1 tablet three times a Rafaella Kole   9. Co Q-10 30 mg capsule, 1 every Shanayah Kaffenberger  10. calcipotriene 0.005 % topical cream, as directed  11. ciclopirox 0.77 % topical cream, as directed  12. clobetasol 0.05 % topical cream, as directed  13. Enbrel 50 mg/mL (0.98 mL) subcutaneous  syringe, once per week  14. Aspirin Low Dose 81 mg tablet,delayed release, 1 po qd  15. amlodipine 5 mg tablet, 1 po qd  ___   FAMILY HISTORY  Father -- Coronary Artery Disease   Mother -- Coronary Artery Disease    __  CARDIAC RISK FACTORS      Tobacco Abuse: used to smoke, but quit; Family History of Heart Disease: positive;  Hyperlipidemia: positive; Hypertension: positive;   Diabetes Mellitus: positive; Prior History of Heart Disease: negative;  Obesity: positive; Sedentary  Life Style:negative; Age :positive; Menopausal:not applicable  __  SOCIAL HISTORY     Alcohol Use: drinks occasionally 2 per week and beer 2 per week; Smoking: used to smoke, but quit; Former  smoker [phone]); Diet: Regular diet; Exercise: Exercises  regularly, walking and exercise limited due to back injury;   __  REVIEW OF SYSTEMS    General : Denies recent weight loss, weight gain, fever or chills or change in exercise tolerance.; Integumentary : Denies any change in hair or nails, rashes, or skin lesions.; Eyes: Denies diplopia, glaucoma or visual  field defects.; Ears, Nose, Throat, Mouth: Denies any hearing loss, epistaxis,  hoarseness or difficulty  speaking.;Respiratory: Denies dyspnea, cough, wheezing or hemoptysis.;  Cardiovascular: Please review HPI; Abdominal  : Denies ulcer disease, hematochezia or melena.;Musculoskeletal:Denies any venous insufficiency, arthritic  symptoms or back problems.; Neurological : Denies any recurrent strokes, TIA, or seizure disorder.;  Psychiatric: Denies any depression, substance abuse or change in cognitive functions.; Endocrine : Denies any weight change, heat/cold intolerance, polydipsia, or polyuria; Hematologic/Immunologic:  Denies any food allergies, seasonal allergies, bleeding disorders.  __  PHYSICAL EXAMINATION    150/80 Sitting, Right arm, large cuff  158/88  Sitting, Left arm, large cuff    Weight: 214.00 lbs.  Height:  70"  BMI: 30   Pulse: 80/min.    Constitutional: Well developed, well nourished, in no acute distress Skin: warm and dry to touch  Head: normocephalic Eyes: conjunctivae and lids normal  ENT: nose with swelling s/p septoplasty Neck:  no JVD Chest: clear to auscultation bilaterally Cardiac : Regular rhythm, S1 normal, S2 normal, No S3 or S4, no murmurs, gallops or rubs detected Abdomen: abdomen soft  Peripheral Pulses: pulses full and equal in all extremities Extremities/Back : no edema present Neurological: oriented to time, person and place        IMPRESSIONS:   1. PVC's, bigeminy, wide QRS after recent outpatient surgery (septoplasty) 1/17. Sent to ER. High K and high creat. On ARB. Since switched to norvasc for BP.   2. Normal echo and thallium  as part of above episode.   3. 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 Had been switched to ARB.   4. H/o HTN. Tolerating norvasc. Some LE edema (mild, not bothering him).   5. Diabetes.  6. Chronic back pain.     PLAN:   Continue norvasc.  OV here in 1 year unless cardiac issues arise.     cc:   CHRISTINA  S. MICHAEL DO      Karle Starch, MD, [facility]

## 2020-12-27 NOTE — Progress Notes (Signed)
[facility]   [address] [place], [place] [address]     TELEMEDICINE VISIT       Kent Jordan, Kent Jordan    Date of Visit:  10/28/2019  Date of Birth: 1951/04/27  Age: 70 yrs.   Medical Record Number: [id number]  Referring Physician: [facility] MD, SEAN  __   CURRENT DIAGNOSES     1. Hyperlipidemia, mixed, E78.2  2. Hypertension (essential or benign or malignant), I10  3. CAD without angina, I25.10  4. 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 Dyspnea unspecified, R06.00  6. Status post PTCA-LCX System with intracoronary stenting, Z95.5  7. Encounter for screening for other viral diseases, Z11.59  __  ALLERGIES     Ace Inhibitors, Angioedema  Ciprofloxacin, Swelling (non-specific)  Gabapentin, Intolerance-dizziness  Valsartan, Hyperkalemia (high serum potassium)  __   MEDICATIONS     1. folic acid 400 mcg tablet, 1 tablet every day  2. Cymbalta 60 mg capsule,delayed release, 1 tablet every day  3. metformin 500 mg tablet, 1 po qd  4. trazodone 50 mg  tablet, PRN  5. Celebrex 200 mg capsule, 1 po bid  6. amlodipine 10 mg tablet, one tablet po daily  7. hydrochlorothiazide 25 mg tablet, 1 tablet every day  8. Toprol XL 25 mg tablet,extended release, 1 po qd  9. Cosentyx 150 mg/mL  subcutaneous syringe, q monthly  10. Eliquis 5 mg tablet, 1 po bid  11. Protonix 20 mg tablet,delayed release, 1 po qd  12. Crestor 5 mg tablet, 1 po qd  13. Plavix 75 mg tablet, 1 po qd  14. Fish Oil 1,200 mg (144 mg-216 mg) capsule,  1 po qd  __  CHIEF COMPLAINT/REASON FOR VISIT  Followup of Arrhythmia-PVCs asymptomatic  __   HISTORY OF PRESENT ILLNESS  The visit today was conducted via telemedicine due to COVID-19 precautions. The patient was in their home and was informed and gave verbal consent to proceed.Visit was conducted  on the FaceTime platform. He feels much better since his circumflex stenting. His breathing is significantly improved. Blood pressure is stable 130/75.He has had some brief 2-second sharp right parasternal  chest discomfortBut no anginal pain.He is about  to start cardiac rehab.  __  PAST HISTORY     Past Medical Illnesses : hyperlipidmia, htn, diabetes, chronic back pain, sleep apnea, hyperkalemia;  Past Cardiac Illnesses : Coronary Artery Disease; Infectious Diseases: No previous history of significant infectious diseases.;  Surgical Procedures: microdiscectomy lumbar, abdominal hernia repair 2009, back surgery 9/12,11/12,2/13, spinal cord generator 2015, septoplasty 2017;  Trauma History: No previous history of significant trauma.; Cardiology Procedures-Invasive: No previous  interventional or invasive cardiology procedures.; Cardiology Procedures-Noninvasive: Echocardiogram January 2017, MPI Dual Isotope Lexiscan January 2017;  Left Ventricular Ejection Fraction: LVEF of 60% documented via nuclear study on 10/18/2015  ___   FAMILY HISTORY  Father -- Coronary Artery Disease  Mother --  Coronary Artery Disease    __  CARDIAC RISK FACTORS     Tobacco Abuse: used to smoke, but quit;  Family History of Heart Disease: positive; Hyperlipidemia: positive;  Hypertension: positive;  Diabetes Mellitus: positive;  Prior History of Heart Disease: negative; Obesity: negative;  Sedentary Life Style:negative; GUY:[id number]; Menopausal :not applicable  __  SOCIAL HISTORY     Alcohol Use: drinks occasionally 2 per week; Smoking: used to smoke, but quit; Former smoker [phone]  Diet: Regular diet; Exercise: Exercises regularly, walking and exercise limited s/p November 2017 knee  replacement;   __  REVIEW OF  SYSTEMS    General : Denies recent weight loss, weight gain, fever or chills or change in exercise tolerance.; Integumentary: Denies any change in hair or nails, rashes,  or skin lesions.; Eyes: Denies diplopia, glaucoma or visual field defects.; Ears, Nose, Throat, Mouth : Denies any hearing loss, epistaxis, hoarseness or difficulty speaking.;Respiratory: Denies dyspnea, cough, wheezing or hemoptysis.;  Cardiovascular: Please  review HPI; Abdominal : Denies ulcer disease, hematochezia or melena.; Musculoskeletal:Denies any venous insufficiency, arthritic symptoms or back problems.; Neurological  : Denies any recurrent strokes, TIA, or seizure disorder.; Psychiatric: Denies any depression, substance abuse or change in cognitive functions.;  Endocrine: Denies any weight change, heat/cold intolerance, polydipsia, or polyuria; Hematologic/Immunologic : Denies any food allergies, seasonal allergies, bleeding disorders.  __  PHYSICAL EXAMINATION    Vital Signs:   Blood Pressure:      Weight: 202.00 lbs.   Height: 70.00"  BMI: 28.98       Constitutional:  Well developed, well nourished, in no acute distress  Head: normocephalic  Eyes: conjunctivae and lids normal ENT: No pallor or cyanosis  Chest: normal respiratory effort Neurological: oriented to time, person and place   __     IMPRESSIONS:   1. PVC's, bigeminy, wide QRS after recent outpatient surgery (septoplasty) 1/17. Sent to ER. High K and high creat. On ARB. Since switched to norvasc for BP.   2. Normal echo and thallium as part of above episode (2017).   3.  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 Had been switched to ARB. Now off both. Prior low heart rate on beta-blocker.   4. H/o HTN. Tolerating norvasc. HCTZ. Some LE edema (mild, not bothering him). Now on low dose beta-blocker. HR and BP doing well.   5. Diabetes.  Diet, weight loss, oral medications.   6. Chronic back pain. Has a nerve stimulator in place (no MRI). PT.   7. Elevated LFT's. Suspect some fatty liver (2 beers a day). His primary also indicates hemochromatosis.   8. Prior statin. Currently  LDL very low just on zetia (primary manages).   9. 2020 progressive dyspnea on exertion. Ultimately diagnosed with small PEs. Eliquis since. Dyspnea persists despite low clot burden on CT, normal PA pressures on echo. Not having chest pains. Did not  have COVID (tested in hospital).   10 Coronary artery disease status post circumflex artery  stenting x2January 2021  11 Rash likely due to Eliquis    RECOMMENDATIONS:  1. Okay to start cardiac rehab. No stress testing needed.  2.  Follow-up with pulmonary regarding alternative to Eliquis for anticoagulation for pulmonary embolus in view of the rash    Arianis Bowditch A. Hyacinth Meeker, MD, [facility]    cc: Erasmo Score MD  ____________________________  Christianne Dolin  Return  Visit 15 MIN 3 months

## 2020-12-27 NOTE — Progress Notes (Signed)
[facility]ICE   [address] [place], [place] [address]     Kent Jordan    Date of Visit:  12/02/2018  Date of Birth: 08/15/1951  Age: 70 yrs.   Medical Record Number: [id number]  Referring Physician: Arlester Marker MD, SEAN    Mr. Folkert returns for scheduled  follow up.    To review, he is a 70 y.o. M with h/o HTN. He was on ACEi, eventually with angioedema, switched to ARB. He was for outpatient septoplasty and had bigeminy and wide QRS noted in recovery. He was sent to [place] with high creat and  K noted. He was treated and released. He had normal echo and thallium testing as part of that episode. He has been back to see Arville Care, NP here. She stopped his ARB and switched him to norvasc. She arranged labs with creat .9 and K 4.4. He noted trace  LE edema only on the norvasc (not bothered by it, not there all the time). HCTZ. He has DM. Treated with weight loss, diet, oral medications. He required R TKR. No perioperative cardiac issues. At our last visit BP was elevated. I added metoprolol.      Last visit he is doing well. He takes 1/2 tablet of metoprolol. Blood pressure at home runs from 130-135/75. Blood pressure here today has systolics that are little bit higher but he is having issues with back pain as he gets around. He has some loose  screws from old surgeries. Being treated with physical therapy.    EKG: NSR.   __  PAST HISTORY      Past Medical Illnesses: hyperlipidmia, htn, diabetes, chronic back pain, sleep apnea, hyperkalemia;   Past Cardiac Illnesses: No previous history of cardiac disease.; Infectious Diseases: No previous history  of significant infectious diseases.; Surgical Procedures: microdiscectomy lumbar, abdominal hernia repair 2009, back surgery 9/12,11/12,2/13, spinal cord  generator 2015, septoplasty 2017; Trauma History: No previous history of significant trauma.; Cardiology  Procedures-Invasive: No previous interventional or invasive cardiology procedures.;  Cardiology Procedures-Noninvasive : Echocardiogram January 2017, MPI Dual Isotope Lexiscan January 2017; Left Ventricular Ejection Fraction: LVEF of 60% documented via nuclear study on  10/18/2015    ALLERGIES    Ace Inhibitors, Angioedema  Ciprofloxacin, Swelling (non-specific)   Gabapentin, Intolerance-dizziness  Valsartan, Hyperkalemia (high serum potassium)  __  MEDICATIONS      1. Zetia 10 mg tablet, 1 tablet a Lequita Meadowcroft  2. folic acid 400 mcg tablet, 1 tablet every Tuesday Terlecki  3. Cymbalta 60 mg capsule,delayed release, 1 tablet every Kamryn Messineo  4. Aspirin Low Dose 81 mg tablet,delayed release, 1 po qd  5. metformin  500 mg tablet, 1 po qd  6. cyclobenzaprine 10 mg tablet, PRN  7. trazodone 50 mg tablet, PRN  8. Celebrex 200 mg capsule, 1 po bid  9. amlodipine 10 mg tablet, one tablet po daily  10. Hydrochlorothiazide 25 Mg Tablet, 1 tablet every  Rehaan Viloria  11. Toprol XL 25 mg tablet,extended release, take 1/2 tab (12.5 mg) daily po  ___  FAMILY  HISTORY  Father -- Coronary Artery Disease  Mother  -- Coronary Artery Disease    __  CARDIAC RISK FACTORS      Tobacco Abuse: used to smoke, but quit; Family History of Heart Disease: positive;  Hyperlipidemia: positive; Hypertension: positive;   Diabetes Mellitus: positive; Prior History of Heart Disease: negative;  Obesity: positive; Sedentary Life Style:negative; Age :positive; Menopausal:not applicable  __  SOCIAL HISTORY     Alcohol  Use: drinks occasionally 2 per week and beer 2 per week; Smoking: used to smoke, but quit; Former  smoker [phone]); Diet: Regular diet; Exercise: Exercises  regularly, walking and exercise limited due to back injury;   __  REVIEW OF SYSTEMS    General : Denies recent weight loss, weight gain, fever or chills or change in exercise tolerance.; Integumentary : Denies any change in hair or nails, rashes, or skin lesions.; Eyes: Denies diplopia, glaucoma or visual  field defects.; Ears, Nose, Throat, Mouth: Denies any hearing loss, epistaxis, hoarseness or  difficulty  speaking.;Respiratory: Denies dyspnea, cough, wheezing or hemoptysis.;  Cardiovascular: Please review HPI; Abdominal  : Denies ulcer disease, hematochezia or melena.;Musculoskeletal:Denies any venous insufficiency, arthritic  symptoms or back problems.; Neurological : Denies any recurrent strokes, TIA, or seizure disorder.;  Psychiatric: Denies any depression, substance abuse or change in cognitive functions.; Endocrine : Denies any weight change, heat/cold intolerance, polydipsia, or polyuria; Hematologic/Immunologic:  Denies any food allergies, seasonal allergies, bleeding disorders.  __  PHYSICAL EXAMINATION    152/74  150/70     Weight: 211.00 lbs.  Height: 70.00"  BMI:  30.27   Pulse: 71/min.    Constitutional:  Well developed, well nourished, in no acute distress Skin: warm and dry to touch Head : normocephalic Eyes: conjunctivae and lids normal  ENT: nose with swelling s/p septoplasty Neck:  no JVD Chest: clear to auscultation bilaterally Cardiac : Regular rhythm, S1 normal, S2 normal, No S3 or S4, no murmurs, gallops or rubs detected Abdomen: abdomen soft  Peripheral Pulses: pulses full and equal in all extremities Extremities/Back : no edema present Neurological: oriented to time, person and place        IMPRESSIONS:   1. PVC's, bigeminy, wide QRS after recent outpatient surgery (septoplasty) 1/17. Sent to ER. High K and high creat. On ARB. Since switched to norvasc for BP.   2. Normal echo and thallium  as part of above episode (2017).   3. 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 Had been switched to ARB. Now off both. Prior low heart rate on beta-blocker.   4. H/o HTN. Tolerating norvasc. HCTZ. Some LE edema (mild, not bothering him). Now on low dose beta-blocker.  HR and BP doing well.   5. Diabetes. Diet, weight loss, oral medications.   6. Chronic back pain. Has a nerve stimulator in place (no MRI). PT.   7. Elevated LFT's. Suspect some fatty liver (2 beers a Donetta Isaza). His primary also indicates  hemochromatosis.    8. Prior statin. Currently LDL very low just on zetia (primary manages).     PLAN:   No changes today.  Continue current medications for BP  at current doses.  Return here in a year. Sooner if BP issues or other cardiac issues arise.     Karle Starch, MD, [facility]      cc: Erasmo Score MD

## 2020-12-27 NOTE — Progress Notes (Signed)
[facility]  [address] [place], [place] [address]     [name]    Date of Visit:  03/05/2017  Date of Birth: 06-15-1951  Age: 70 yrs.   Medical Record Number: [id number]  Referring Physician: Arlester Marker MD, SEAN    Mr. Graffam returns for scheduled  follow up.    To review, he is a 70 y.o. M with h/o HTN. He was on ACEi, eventually with angioedema, switched to ARB. He was for outpatient septoplasty and had bigeminy and wide QRS noted in recovery. He was sent to [place] with high creat and  K noted. He was treated and released. He had normal echo and thallium testing as part of that episode. He has been back to see Arville Care, NP here. She stopped his ARB (prior angioedema to ACEi and prior high K to ARB). She replaced with norvasc. She  arranged labs with creat .9 and K 4.4. He notes trace LE edema only on the norvasc (not bothered by it, not there all the time). He has DM. Treated with weight loss, diet, oral medications.     Since last visit he is doing well. He required R TKR.  No perioperative cardiac issues. Weight up a bit since his last visit (wedding in the family, end of the Caps season) but he is back to bringing it down (diet and exercise).     EKG last visit: NSR.   __   PAST HISTORY     Past Medical Illnesses: hyperlipidmia, htn, diabetes, chronic back pain, sleep apnea,  hyperkalemia;  Past Cardiac Illnesses: No previous history of cardiac disease.;  Infectious Diseases: No previous history of significant infectious diseases.; Surgical Procedures: microdiscectomy  lumbar, abdominal hernia repair 2009, back surgery 9/12,11/12,2/13, spinal cord generator 2015, septoplasty 2017; Trauma History: No previous history of  significant trauma.; Cardiology Procedures-Invasive: No previous interventional or invasive cardiology procedures.;  Cardiology Procedures-Noninvasive: Echocardiogram January 2017, MPI Dual Isotope Lexiscan January 2017; Left Ventricular Ejection Fraction : LVEF  of 60% documented via nuclear study on 10/18/2015    ALLERGIES    Ace Inhibitors, Angioedema   Ciprofloxacin, Swelling (non-specific)  Gabapentin, Intolerance-dizziness  Valsartan, Hyperkalemia (high serum potassium)  __  MEDICATIONS      1. amlodipine 10 mg tablet, one tablet po daily  2. Aspirin Low Dose 81 mg tablet,delayed release, 1 po qd  3. calcipotriene 0.005 % topical cream, as directed  4. Celebrex 200 mg capsule, 1 po qd  5. ciclopirox 0.77 % topical  cream, as directed  6. clobetasol 0.05 % topical cream, as directed  7. Crestor 5 mg tablet, 1 tablet every Tore Carreker  8. cyclobenzaprine 10 mg tablet, PRN  9. Cymbalta 60 mg capsule,delayed release, 1 tablet every Duy Lemming  10. diazepam 5 mg  tablet, 1 to 2 tablets three times a Aibhlinn Kalmar  11. Enbrel 50 mg/mL (0.98 mL) subcutaneous syringe, once per week  12. folic acid 400 mcg tablet, 1 tablet every Alarik Radu  13. hydrochlorothiazide 25 mg tablet, 1 tablet every Lexia Vandevender  14. metformin 500  mg tablet, 1 po qd  15. trazodone 50 mg tablet, 1 po qhs  16. Zetia 10 mg tablet, 1 tablet a Taunya Goral  ___   FAMILY HISTORY  Father -- Coronary Artery Disease   Mother -- Coronary Artery Disease    __  CARDIAC RISK FACTORS      Tobacco Abuse: used to smoke, but quit; Family History of Heart Disease: positive;  Hyperlipidemia: positive; Hypertension: positive;  Diabetes Mellitus: positive; Prior History of Heart Disease: negative;  Obesity: positive; Sedentary Life Style:negative; Age :positive; Menopausal:not applicable  __  SOCIAL HISTORY     Alcohol Use: drinks occasionally 2 per week and beer 2 per week; Smoking: used to smoke, but quit; Former  smoker [phone]); Diet: Regular diet; Exercise: Exercises  regularly, walking and exercise limited due to back injury;   __  REVIEW OF SYSTEMS    General : Denies recent weight loss, weight gain, fever or chills or change in exercise tolerance.; Integumentary : Denies any change in hair or nails, rashes, or skin lesions.; Eyes: Denies diplopia, glaucoma or  visual  field defects.; Ears, Nose, Throat, Mouth: Denies any hearing loss, epistaxis, hoarseness or difficulty  speaking.;Respiratory: Denies dyspnea, cough, wheezing or hemoptysis.;  Cardiovascular: Please review HPI; Abdominal  : Denies ulcer disease, hematochezia or melena.;Musculoskeletal:Denies any venous insufficiency, arthritic  symptoms or back problems.; Neurological : Denies any recurrent strokes, TIA, or seizure disorder.;  Psychiatric: Denies any depression, substance abuse or change in cognitive functions.; Endocrine : Denies any weight change, heat/cold intolerance, polydipsia, or polyuria; Hematologic/Immunologic:  Denies any food allergies, seasonal allergies, bleeding disorders.  __  PHYSICAL EXAMINATION    150/80 Sitting, Right arm, large cuff  158/88  Sitting, Left arm, large cuff    Weight: 214.00 lbs.  Height:  70"  BMI: 30   Pulse: 80/min.    Constitutional: Well developed, well nourished, in no acute distress Skin: warm and dry to touch  Head: normocephalic Eyes: conjunctivae and lids normal  ENT: nose with swelling s/p septoplasty Neck:  no JVD Chest: clear to auscultation bilaterally Cardiac : Regular rhythm, S1 normal, S2 normal, No S3 or S4, no murmurs, gallops or rubs detected Abdomen: abdomen soft  Peripheral Pulses: pulses full and equal in all extremities Extremities/Back : no edema present Neurological: oriented to time, person and place        IMPRESSIONS:   1. PVC's, bigeminy, wide QRS after recent outpatient surgery (septoplasty) 1/17. Sent to ER. High K and high creat. On ARB. Since switched to norvasc for BP.   2. Normal echo and thallium  as part of above episode (2017).   3. 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 Had been switched to ARB. Now off both.   4. H/o HTN. Tolerating norvasc. HCTZ. Some LE edema (mild, not bothering him).   5. Diabetes. Diet, weight loss, oral medications.    6. Chronic back pain. Has a nerve stimulator in place (no MRI).     PLAN:   Continue norvasc, HCTZ.  OV  here in 1 year unless cardiac issues  arise.     Karle Starch, MD, [facility]

## 2020-12-27 NOTE — Progress Notes (Signed)
[facility]  [address] [place], [place] [address]     [name]    Date of Visit:  09/14/2016  Date of Birth: Dec 18, 1950  Age: 70 yrs.   Medical Record Number: [id number]  Referring Physician: MICHAEL DO, CHRISTINA S.  __   CURRENT DIAGNOSES     1. Hyperlipidemia, mixed, E78.2  2. Hypertension (essential or benign or malignant), I10  3. 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 Pre-op cardiovascular examination, Z01.810  __  ALLERGIES    Ace Inhibitors, Angioedema   Ciprofloxacin, Swelling (non-specific)  Gabapentin, Intolerance-dizziness  Valsartan, Hyperkalemia (high serum potassium)  __  MEDICATIONS      1. Crestor 5 mg tablet, 1 tablet every day  2. Zetia 10 mg tablet, 1 tablet a day  3. folic acid 400 mcg tablet, 1 tablet every day  4. Cymbalta 60 mg capsule,delayed release, 1 tablet every day  5. diazepam 5 mg tablet, 1  to 2 tablets three times a day  6. calcipotriene 0.005 % topical cream, as directed  7. ciclopirox 0.77 % topical cream, as directed  8. clobetasol 0.05 % topical cream, as directed  9. Enbrel 50 mg/mL (0.98 mL) subcutaneous syringe, once  per week  10. Aspirin Low Dose 81 mg tablet,delayed release, 1 po qd  11. hydrochlorothiazide 25 mg tablet, 1 tablet every day  12. trazodone 50 mg tablet, 1 po qhs  13. amlodipine 10 mg tablet, one tablet po daily  14. Celebrex 200  mg capsule, 1 po qd  15. metformin 500 mg tablet, 1 po qd  16. cyclobenzaprine 10 mg tablet, PRN  __  CHIEF COMPLAINT/REASON FOR VISIT   Followup of Arrhythmia-Ventricular Tachycardia, Followup of Hyperlipidemia, mixed and Followup of Hypertension (essential or benign or malignant)  __  HISTORY OF PRESENT ILLNESS   [name] is a very pleasant 70 year old gentleman who returns to the office today in followup of his hypertension. At his previous visit in September of 2017, I had increased his amlodipine secondary to hypertension and in anticipation of  undergoing  knee replacement surgery which was completed in November.     At the office today, Mr. Sattar presents with a well-controlled blood pressure of 132/72. Twelve-lead ECG show normal sinus rhythm at 72. He has lost 27 pounds intentionally since his  last visit. He denies chest pain, shortness of breath, dyspnea on exertion, palpitations, lightheadedness, dizziness, or presyncope. His physical examination is unremarkable.   __  PAST HISTORY      Past Medical Illnesses: hyperlipidmia, htn, diabetes, chronic back pain, sleep apnea, hyperkalemia;   Past Cardiac Illnesses: No previous history of cardiac disease.; Infectious Diseases: No previous history  of significant infectious diseases.; Surgical Procedures: microdiscectomy lumbar, abdominal hernia repair 2009, back surgery 9/12,11/12,2/13, spinal cord  generator 2015, septoplasty 2017; Trauma History: No previous history of significant trauma.; Cardiology  Procedures-Invasive: No previous interventional or invasive cardiology procedures.; Cardiology Procedures-Noninvasive : Echocardiogram January 2017, MPI Dual Isotope Lexiscan January 2017; Left Ventricular Ejection Fraction: LVEF of 60% documented via nuclear study on  10/18/2015  ___  FAMILY HISTORY   Father -- Coronary Artery Disease  Mother -- Coronary Artery Disease     __  CARDIAC RISK FACTORS     Tobacco Abuse: used to smoke, but quit;  Family History of Heart Disease: positive; Hyperlipidemia: positive;  Hypertension: positive;  Diabetes Mellitus: positive;  Prior History of Heart Disease: negative; Obesity: negative;  Sedentary Life Style:negative; HQI:[id number]; Menopausal :  not applicable  __  SOCIAL HISTORY    Alcohol Use : drinks occasionally 2 per week; Smoking: used to smoke, but quit; Former smoker [phone] Diet : Regular diet; Exercise: Exercises regularly, walking and exercise limited s/p November 2017 knee replacement;   __   PHYSICAL EXAMINATION    Vital Signs:  Blood Pressure:   136/76  Sitting, Right arm, regular cuff  132/72 Sitting, Left arm, regular cuff    Weight: 182.00 lbs.   Height: 70"  BMI: 26   Pulse:  72/min. Apical Regular       Constitutional: Well developed, well nourished, in no acute distress  Skin: warm and dry to touch Head: normocephalic, normal  male hair pattern Eyes: conjunctivae and lids normal ENT : Ears, Nose and throat reveal no gross abnormalities Neck:  no JVD, no bruits Chest: clear to auscultation bilaterally Cardiac : Regular rhythm, S1 normal, S2 normal, No S3 or S4, no murmurs, gallops or rubs detected Abdomen: unremarkable  Peripheral Pulses: pulses full and equal in all extremities Extremities/Back : no edema present, recent right knee replacement - incision healing well Neurological: oriented to time,  person and place   __    Medications added today by the physician:    IMPRESSION:   1. Hypertension, well controlled.  a. Intolerances to ACE inhibitor with angioedema and ARB with hyperkalemia.  b. Currently tolerating Norvasc.  2. Prior episode of premature ventricular contractions/bigeminy following outpatient septoplasty    in January 2017, no recurrence.  a. Echocardiogram October 17, 2015 normal.  b. Nuclear stress test on October 18, 2015 normal.  3. Hyperlipidemia on statin.  4. Diabetes mellitus on metformin.     PLAN/RECOMMENDATIONS:  1. Mr. Bonczek will continue his current medications as reconciled today.   2. He will return for a followup visit in approximately six months with Dr. Viviann Spare Day.     Francine Graven Kenda Kloehn, AGACNP-BC     ED/tutjm    cc: CHRISTINA S. MICHAEL DO     ARC  ____________________________  TODAYS ORDERS  12 Lead ECG Today  Diet mgmt edu, guidance and counseling TODAY  Return Visit 15 MIN  6 months SD

## 2020-12-27 NOTE — Progress Notes (Signed)
[facility]  [address] [place], [place] [address]     [name]    Date of Visit:  05/29/2016  Date of Birth: Nov 09, 1950  Age: 70 yrs.   Medical Record Number: [id number]  __  CURRENT DIAGNOSES     1. Hypertension (essential or benign  or malignant), I10  2. 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 Pre-op cardiovascular examination, Z01.810  __  ALLERGIES     Ace Inhibitors, Angioedema  Ciprofloxacin, Swelling (non-specific)  Gabapentin, Intolerance-dizziness  Valsartan, Hyperkalemia (high serum potassium)  __   MEDICATIONS     1. Crestor 5 mg tablet, 1 tablet every day  2. Zetia 10 mg tablet, 1 tablet a day  3. folic acid 400 mcg tablet, 1 tablet every day  4. Cymbalta 60 mg capsule,delayed release,  1 tablet every day  5. diazepam 5 mg tablet, 1 to 2 tablets three times a day  6. cyclobenzaprine 10 mg tablet, 1 tablet three times a day  7. calcipotriene 0.005 % topical cream, as directed  8. ciclopirox 0.77 % topical cream, as directed   9. clobetasol 0.05 % topical cream, as directed  10. Enbrel 50 mg/mL (0.98 mL) subcutaneous syringe, once per week  11. Aspirin Low Dose 81 mg tablet,delayed release, 1 po qd  12. hydrochlorothiazide 25 mg tablet, 1 tablet every day  13.  trazodone 50 mg tablet, 1 po qhs  14. amlodipine 10 mg tablet, one tablet po daily  __  CHIEF COMPLAINT/REASON FOR VISIT  Hypertension F/UP  and Pre-Operative Cardiovascular Evaluation  __  HISTORY OF PRESENT ILLNESS  Kent Jordan is a pleasant, 70 year old gentleman who comes to the  office today for a preoperative cardiovascular evaluation prior to undergoing a right knee replacement with Dr. Harriet Masson on June 12, 2016. Kent Jordan is primarily followed by Dr. Jeannett Senior Day for a history of hypertension.     At the  office today, Kent Jordan presents with a blood pressure that remains elevated at 146-162 over 86-90. He tells me that out-of-office readings, which  he does at a local [facility], have been consistently between 135-145 over 85-90. ECG shows sinus rhythm at a  rate of 79. Bilateral breath sounds are clear to auscultation. There is no evidence of lower extremity edema. Kent Jordan did undergo an echocardiogram and a nuclear stress test in January 2017, both of which were normal.   ____   PAST HISTORY     Past Medical Illnesses: hyperlipidmia, htn, diabetes, chronic back pain, sleep apnea,  hyperkalemia;  Past Cardiac Illnesses: No previous history of cardiac disease.;  Infectious Diseases: No previous history of significant infectious diseases.; Surgical Procedures: microdiscectomy  lumbar, abdominal hernia repair 2009, back surgery 9/12,11/12,2/13, spinal cord generator 2015, septoplasty 2017; Trauma History: No previous history of  significant trauma.; Cardiology Procedures-Invasive: No previous interventional or invasive cardiology procedures.;  Cardiology Procedures-Noninvasive: Echocardiogram January 2017, MPI Dual Isotope Lexiscan January 2017; Left Ventricular Ejection Fraction : LVEF of 60% documented via nuclear study on 10/18/2015  ___  FAMILY HISTORY   Father -- Coronary Artery Disease  Mother -- Coronary Artery Disease     __  CARDIAC RISK FACTORS     Tobacco Abuse: used to smoke, but quit;  Family History of Heart Disease: positive; Hyperlipidemia: positive;  Hypertension: positive;  Diabetes Mellitus: positive;  Prior History of Heart Disease: negative; Obesity: positive;  Sedentary Life Style:negative; HYQ:MVHQIONG; Menopausal :not applicable  __  SOCIAL HISTORY  Alcohol Use : drinks occasionally 2 per week and beer 2 per week; Smoking: used to smoke, but quit; Former smoker [phone]  Diet: Regular diet; Exercise: Exercises regularly, walking and exercise limited due to need for knee replacement;    __  PHYSICAL EXAMINATION    Vital Signs:   Blood Pressure:  162/90 Sitting, Left arm, regular cuff  146/86 Sitting, Right arm, regular cuff    Weight:   209.70 lbs.  Height: 70"  BMI: 30    Pulse: 79/min.       Constitutional: Well developed,  well nourished, in no acute distress Skin: warm and dry to touch Head : normocephalic, normal male hair pattern Eyes: conjunctivae and lids normal  ENT: Ears, Nose and throat reveal no gross abnormalities Neck : no JVD, no bruits Chest: clear to auscultation bilaterally  Cardiac: Regular rhythm, S1 normal, S2 normal, No S3 or S4, no murmurs, gallops or rubs detected Abdomen : unremarkable Peripheral Pulses: pulses full and equal in all extremities  Extremities/Back: no edema present Neurological: oriented  to time, person and place   __    Medications added today by the physician:  amlodipine 10 mg tablet, one tablet po daily, 30     IMPRESSIONS:   1. Prior episode of PVCs bigeminy following outpatient septoplasty in January 2017, no recurrence.  a. Echocardiogram October 17, 2015, normal.  b. Nuclear stress test October 18, 2015, normal.  2. Hypertension, not optimally controlled.  a. Intolerances to ACE inhibitor (angioedema) and ARB (hyperkalemia).  b. Currently tolerating Norvasc.  3. Hyperlipidemia, on statin.  4. Diabetes mellitus with April  2017 hemoglobin A1c 6.0.    RECOMMENDATIONS:   1. I will increase Kent Jordan Norvasc to 10 mg daily.  2. In discussion with Dr. Rogelio Seen, Kent Jordan is considered to be an acceptable cardiac risk   to undergo right knee replacement surgery with Dr. Josiah Lobo pending improvement of his blood pressure.  3. Please do not hesitate to contact [facility] if we might be of any  further assistance. A copy of Mr.   Jordan's 12-lead ECG was faxed to the [facility] and to Dr. Josiah Lobo.     [address], AGACNP-BC     Tid: [id number]:JYN:    cc: Harriet Masson MD  ROSEMARY NZEOGU CNP   CHRISTINA Kathie Rhodes MICHAEL DO  [facility] [phone]    Enclosure: 12-lead ECG    SJ

## 2020-12-31 ENCOUNTER — Encounter (INDEPENDENT_AMBULATORY_CARE_PROVIDER_SITE_OTHER): Payer: Self-pay

## 2021-01-29 ENCOUNTER — Encounter (INDEPENDENT_AMBULATORY_CARE_PROVIDER_SITE_OTHER): Payer: Self-pay

## 2021-01-30 ENCOUNTER — Encounter (INDEPENDENT_AMBULATORY_CARE_PROVIDER_SITE_OTHER): Payer: Self-pay

## 2021-10-03 IMAGING — CT CT LEFT WRIST WITHOUT CONTRAST
3 series · 13 of 20 positions shown, 15 images · non-contrast
Comparison: None

________________________________________________________________________________________________ 
CT LEFT WRIST WITHOUT CONTRAST, 10/03/2021 [DATE]: 
CLINICAL INDICATION: Left scaphoid fracture. 
A search for DICOM formatted images was conducted for prior CT imaging studies 
completed at a non-affiliated media free facility.
TECHNIQUE: The left wrist was scanned without contrast on a high resolution low 
dose CT scanner. Standard reconstructions were performed.

[Series 3: axial st · axial · 0.36mm/px · z∈[-564,-475]mm · 5 of 268 slices shown]
[im 45/268  bone]
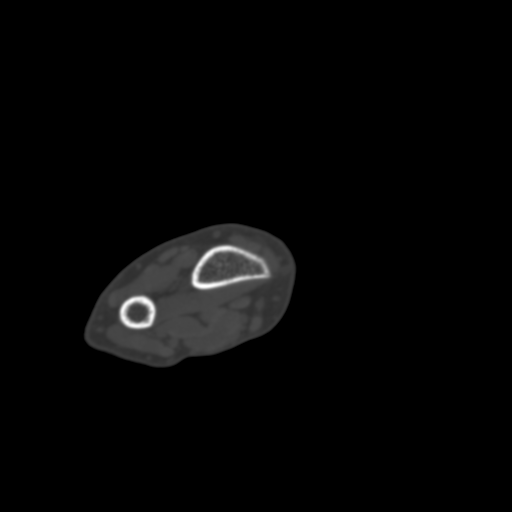
[im 90/268  bone]
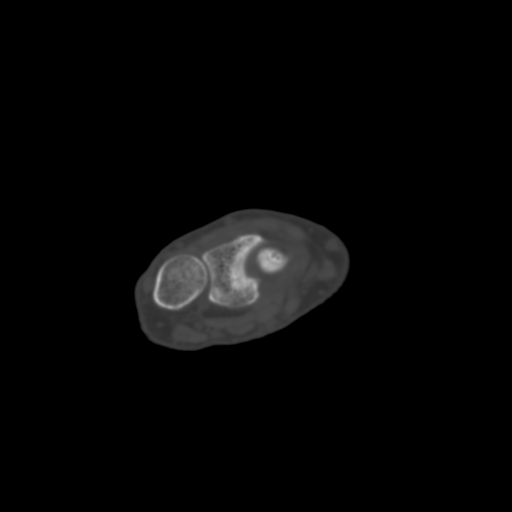
[im 134/268  bone]
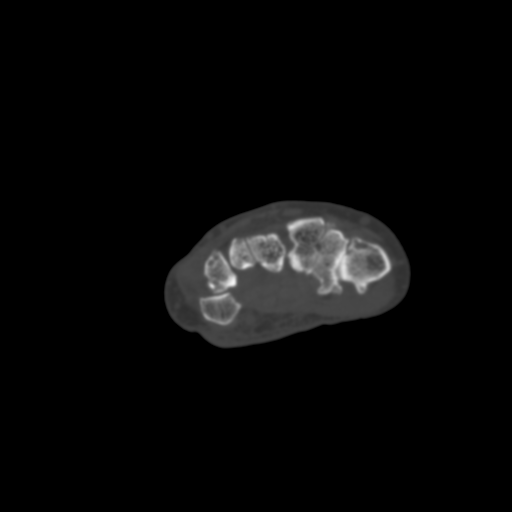
[im 179/268  bone]
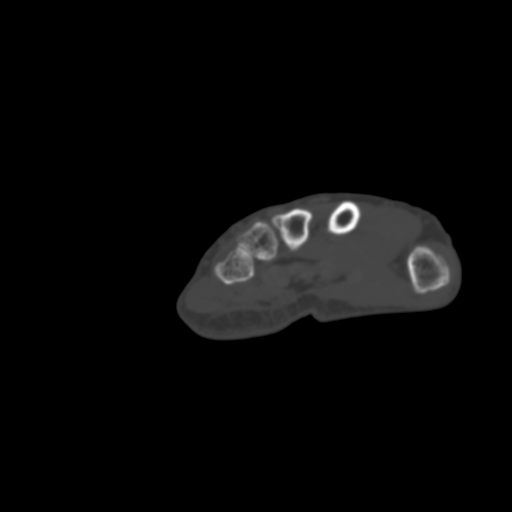
[im 223/268  bone]
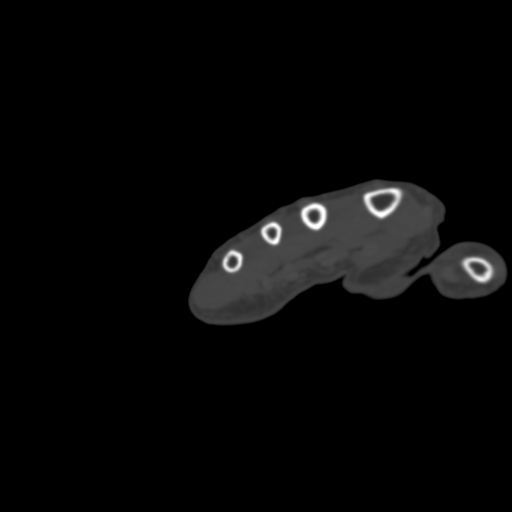

[Series 4: bone · axial · 0.23mm/px · z∈[-583,-497]mm · 5 of 270 slices shown, 7 images]
[im 45/270  soft-tissue]
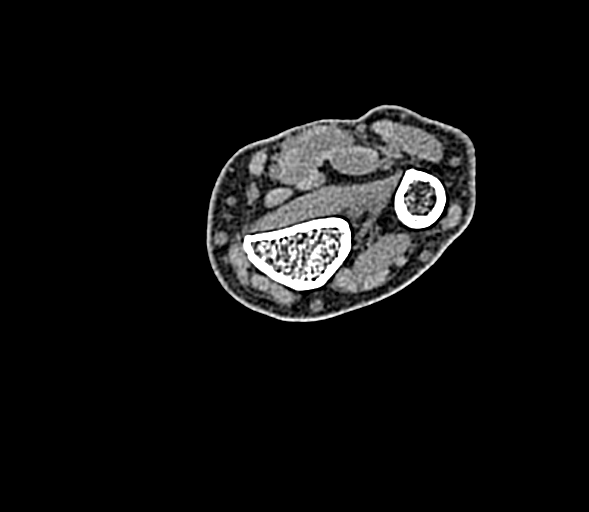
[im 45/270  bone]
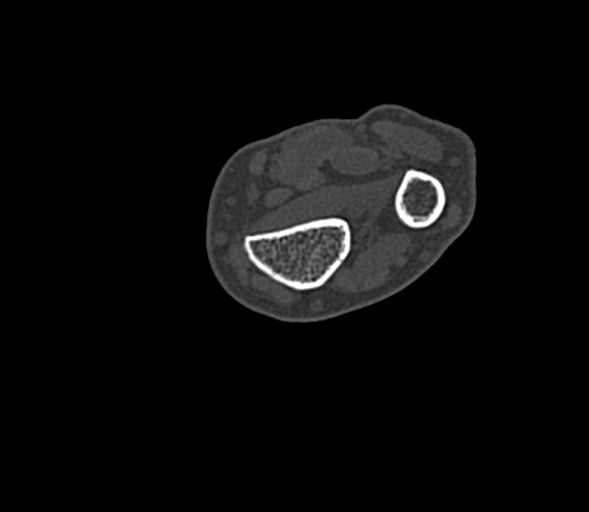
[im 90/270  bone]
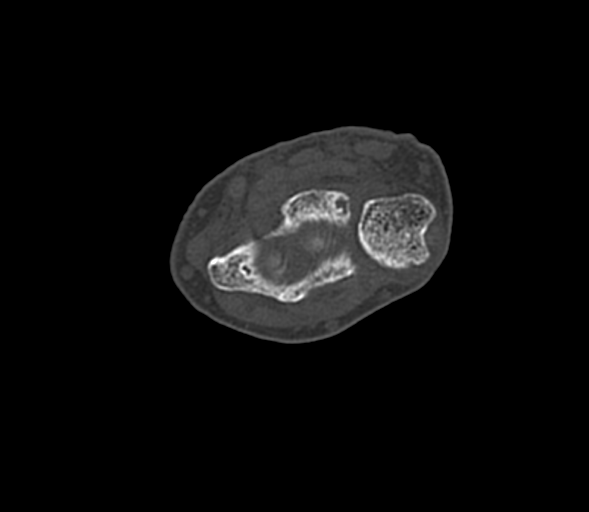
[im 135/270  bone]
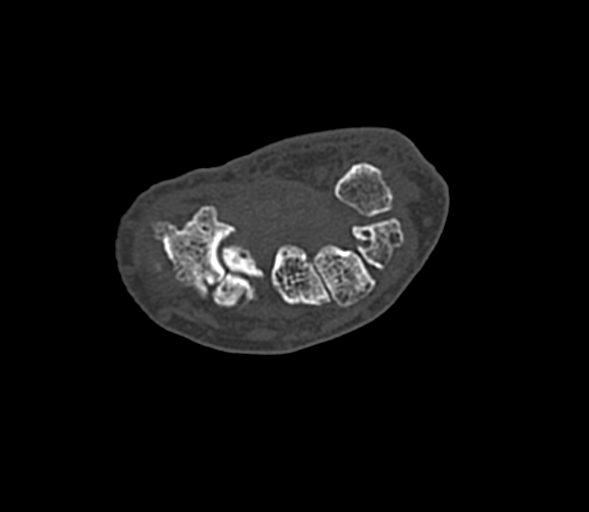
[im 180/270  bone]
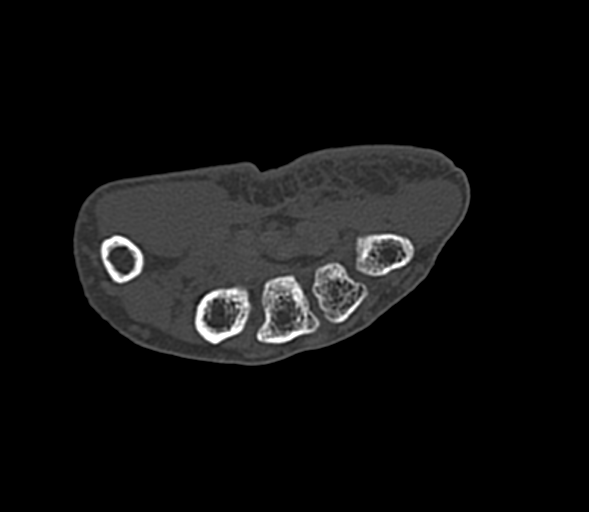
[im 225/270  soft-tissue]
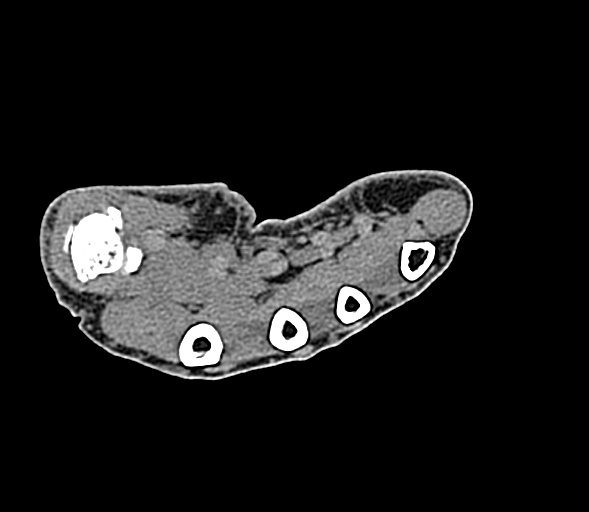
[im 225/270  bone]
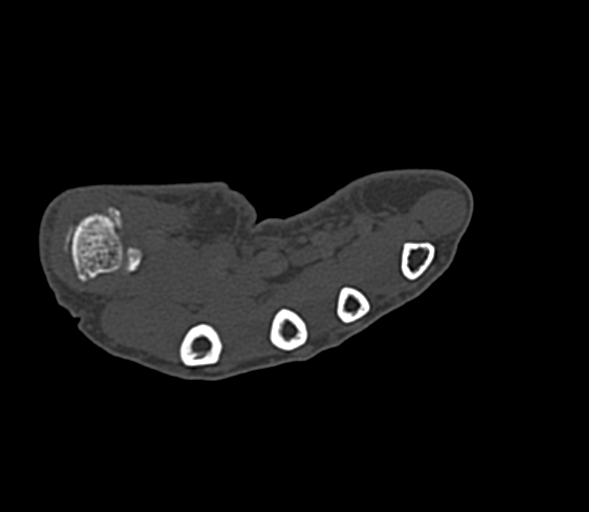

[Series 5: cor bone · coronal · 0.29mm/px · 3 of 120 slices shown]
[im 24/120  bone]
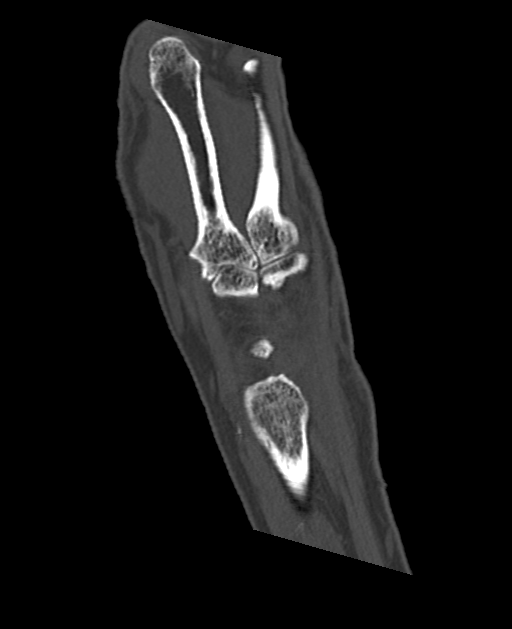
[im 48/120  bone]
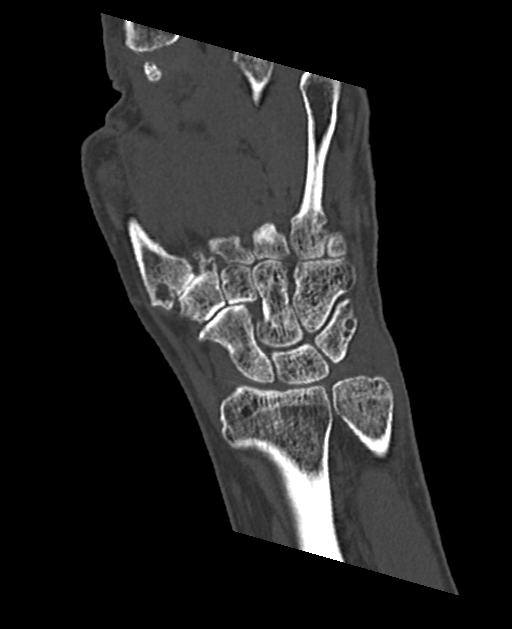
[im 72/120  bone]
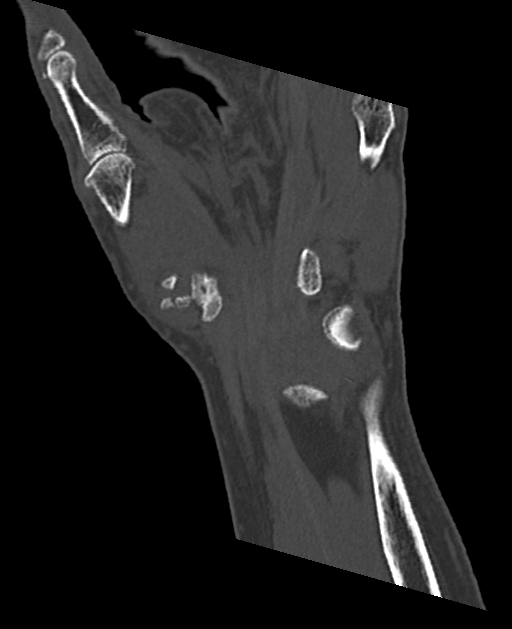

[13 of 20 positions shown; findings below may reference images not displayed]

FINDINGS: BONES/JOINTS: No scaphoid fracture. Marked thumb carpometacarpal (1P1-1) joint, 
moderate scaphotrapeziotrapezoid (STT) complex and mild radioscaphoid/distal 
radioulnar joint (DRUJ)/thumb metacarpophalangeal (MCP) joint degenerative 
change with joint space narrowing, subcortical cysts and marginal osteophytes. 
Multiple carpal cysts with the largest scaphoid cyst measuring 0.4 cm and the 
proximal pole. Subcortical cystic change with marginal sclerosis in the proximal 
medial lunate and neutral ulnar variance: ulnolunate abutment cannot be 
excluded. 
MUSCLES/TENDONS: Normal. 
ADDITIONAL FINDINGS: No mass or fluid collection.
IMPRESSION: 1.  No scaphoid fracture. 
2.  Multifocal degenerative change, most marked involving the 1P1-1 joint. 
RADIATION DOSE REDUCTION: All CT scans are performed using radiation dose 
reduction techniques, when applicable. Technical factors are evaluated and 
adjusted to ensure appropriate moderation of exposure. Automated dose management 
technology is applied to adjust the radiation doses to minimize exposure while 
achieving diagnostic quality images.

## 2022-03-30 IMAGING — CT CT CHEST WITHOUT CONTRAST
2 of 4 series · 14 of 36 positions shown, 17 images · non-contrast
Comparison: None available.

________________________________________________________________________________________________ 
CT CHEST WITHOUT CONTRAST, 03/30/2022 [DATE]: 
CLINICAL INDICATION: History of esophageal malignancy 1711. Lung nodule. 
A search for DICOM formatted images was conducted for prior CT imaging studies 
completed at a non-affiliated media free facility.
TECHNIQUE: The chest was scanned from base of neck through the lung bases 
without contrast on a high resolution low dose CT scanner. Routine MPR and MIP 
3D renderings were reconstructed on an independent workstation with concurrent 
physician supervision.

[Series 2: axial · axial · 0.76mm/px · z∈[+980,+1286]mm · 11 of 169 slices shown, 14 images]
[im 8/169  mediastinal]
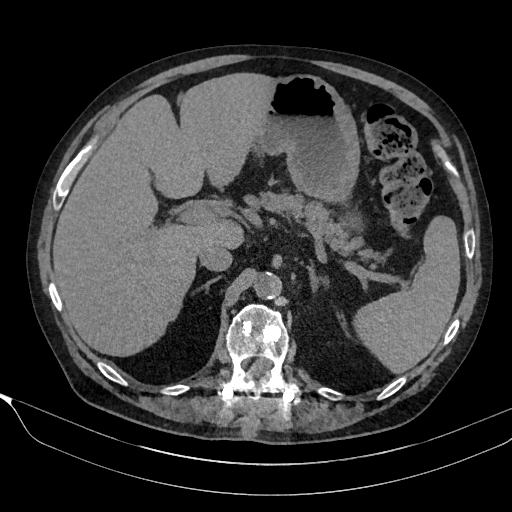
[im 8/169  lung]
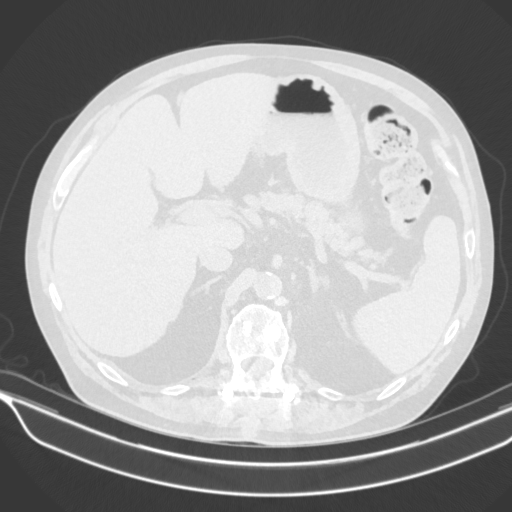
[im 23/169  lung]
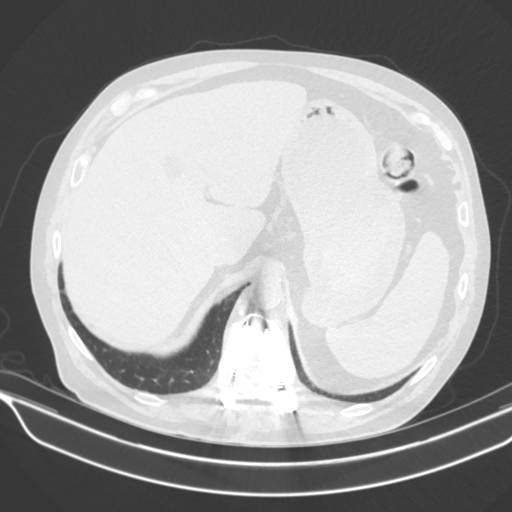
[im 39/169  lung]
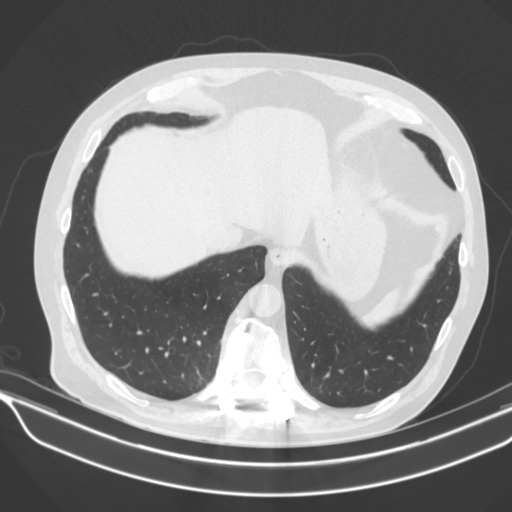
[im 54/169  lung]
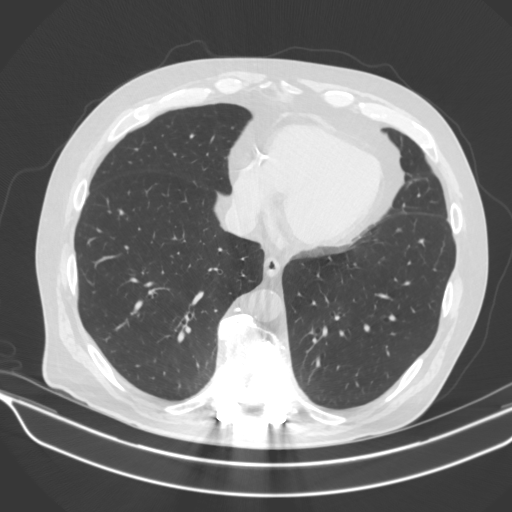
[im 69/169  mediastinal]
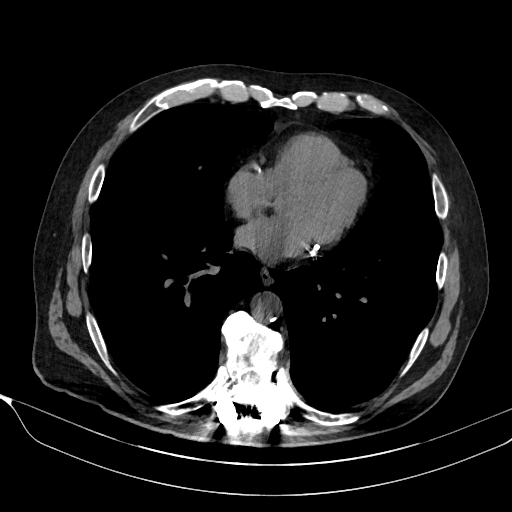
[im 69/169  lung]
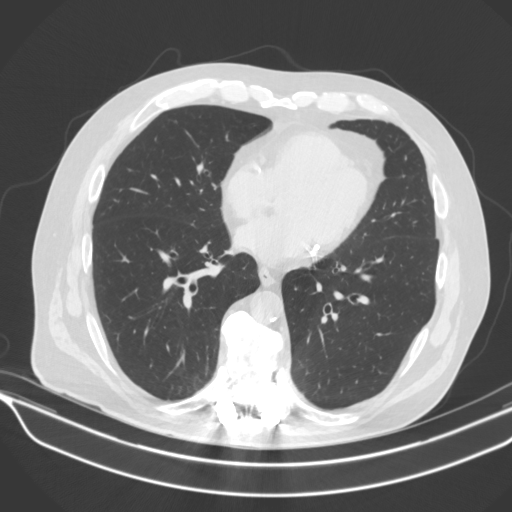
[im 85/169  lung]
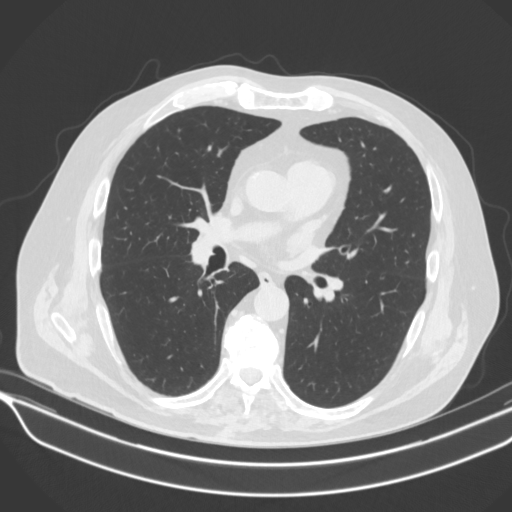
[im 100/169  lung]
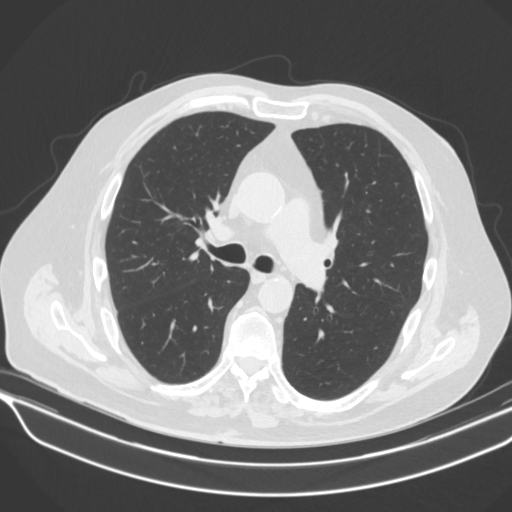
[im 115/169  lung]
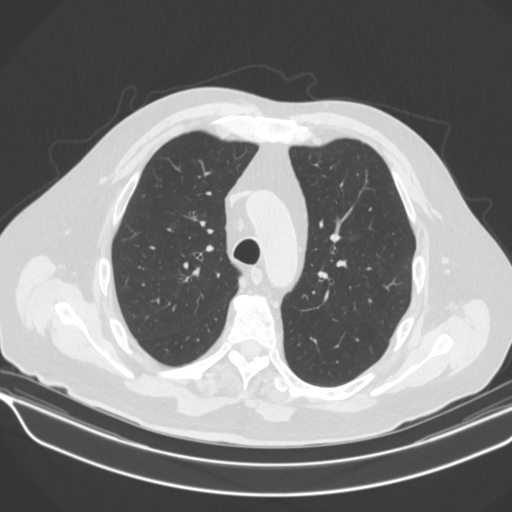
[im 130/169  mediastinal]
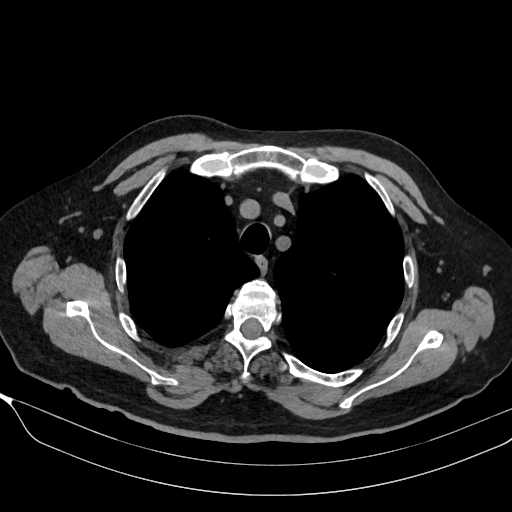
[im 130/169  lung]
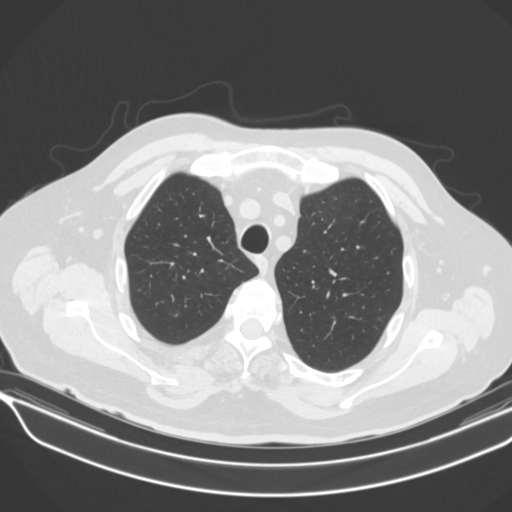
[im 146/169  lung]
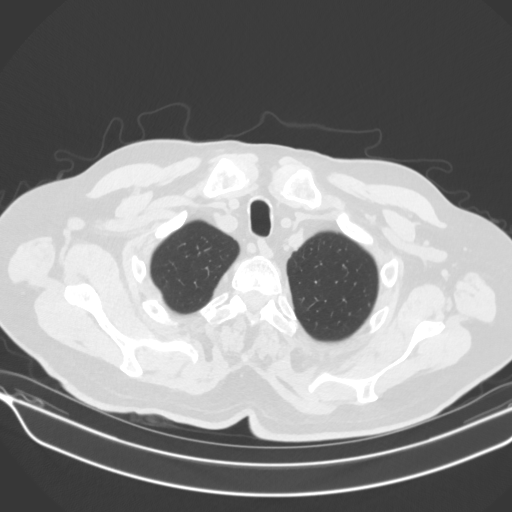
[im 161/169  lung]
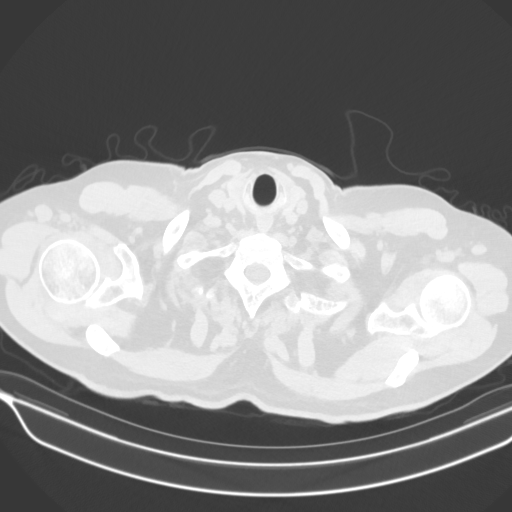

[Series 4: cor · coronal · 0.66mm/px · 3 of 147 slices shown]
[im 30/147  lung]
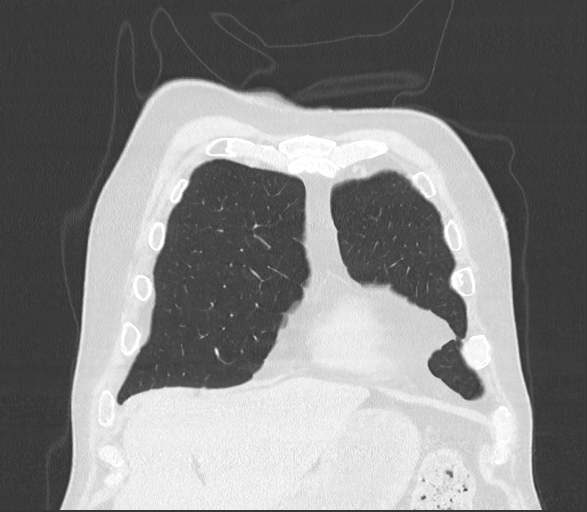
[im 59/147  lung]
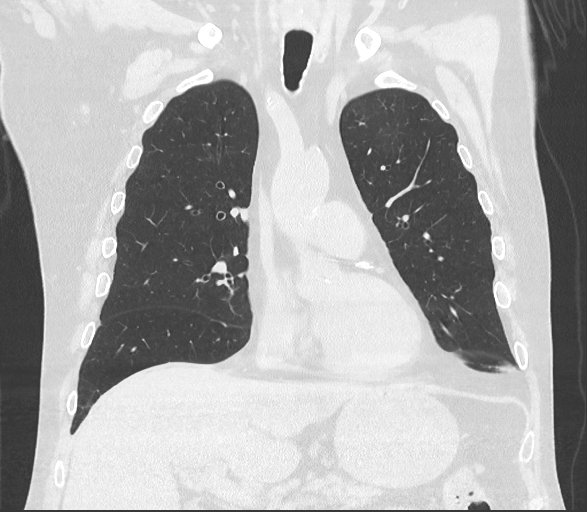
[im 88/147  lung]
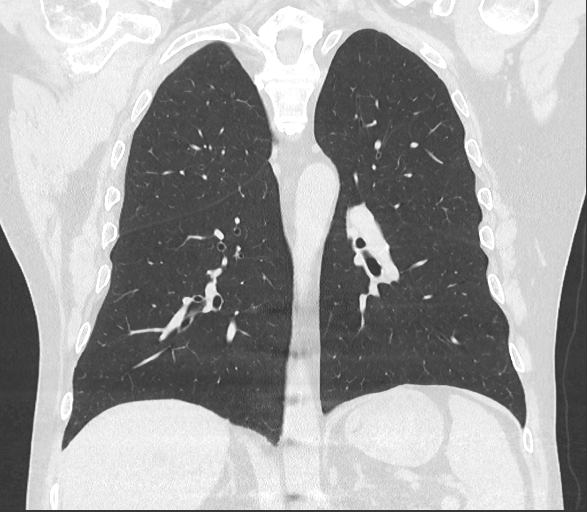

[14 of 36 positions shown; findings below may reference images not displayed]

FINDINGS: A circumscribed 7 mm nodule is in the LEFT lower lobe on axial image 66. There 
is a 2 mm nodule in the RIGHT lung also on axial image 66, incidental. No other 
measurable lung nodules are present. No evidence for centrilobular emphysema, 
interstitial lung disease or acute pulmonary pathology. Lungs are otherwise 
unremarkable. No pleural effusion. The heart is normal in size. There are severe 
coronary artery calcifications. Main pulmonary artery and thoracic aorta are 
normal in caliber. The esophagus is normal. No suspicious mediastinal or 
axillary lymph nodes are present. Limited imaging of the inferior neck is 
unremarkable. Limited imaging of the upper abdomen shows hepatic cysts measuring 
upwards of 22 mm. Degenerative changes are incidentally noted in the spine. 
There has been vertebroplasty at 2 lower thoracic vertebral bodies. Partially 
imaged fusion hardware extends from the mid thoracic spine inferiorly into the 
lumbar spine.
IMPRESSION: 1. There is a 7 mm LEFT lower lobe nodule of uncertain stability. See reference 
below for follow-up recommendations. 
2. No suspicious mediastinal lymph nodes. 
3. Severe coronary artery calcifications. Consider CT calcium scoring. 
REFERENCE: 
Recommendations for pulmonary nodule follow-up according to [HOSPITAL] 
Guidelines. 
Solitary nodule size: < 6 mm 
*Low-risk patients: No followup needed. 
*High-risk patients: Optional CT at 12 months. 
SOLITARY NODULE SIZE: 6 TO 8 MM 
*LOW-RISK PATIENTS: FOLLOW-UP AT 6-12 MONTHS, THEN CONSIDER FURTHER FOLLOW-UP AT 
*HIGH-RISK PATIENTS: INITIAL FOLLOW-UP CT AT 6-12 MONTHS AND THEN AT 18-24 
MONTHS IF NO CHANGE. 
Solitary Nodule size: >8 mm 
*Either low or high risk: 
*Consider follow-up CT at 3 months, and/or CT-PET, and/or biopsy. 
NOTE: 
Low risk patients: minimal or absent history of smoking and or other known risk 
factors. 
High risk patients: history of smoking or of other known risk factors (e.g. 
first degree relative with lung cancer, or exposure to asbestos, radon uranium) 
If a nodule up to 8mm is partly solid or is ground glass further follow up is 
required after 24 months to exclude possible slow growing adenocarcinoma (ALIYAWAR) 
Size is average of length and width. 
RADIATION DOSE REDUCTION: All CT scans are performed using radiation dose 
reduction techniques, when applicable.  Technical factors are evaluated and 
adjusted to ensure appropriate moderation of exposure.  Automated dose 
management technology is applied to adjust the radiation doses to minimize 
exposure while achieving diagnostic quality images.

## 2022-10-30 IMAGING — DX CERVICAL SPINE 4 VIEWS
1 series · 4 of 4 positions shown · non-contrast
Comparison: None

________________________________________________________________________________________________ 
CERVICAL SPINE 4 VIEWS, 10/30/2022 [DATE]: 
CLINICAL INDICATION: Radiculopathy, cervical region. Pain with numbness and 
tingling in hands..

[Series 1: lateral · U · 0.14mm/px · 4 of 4 slices shown]
[im 1/4]
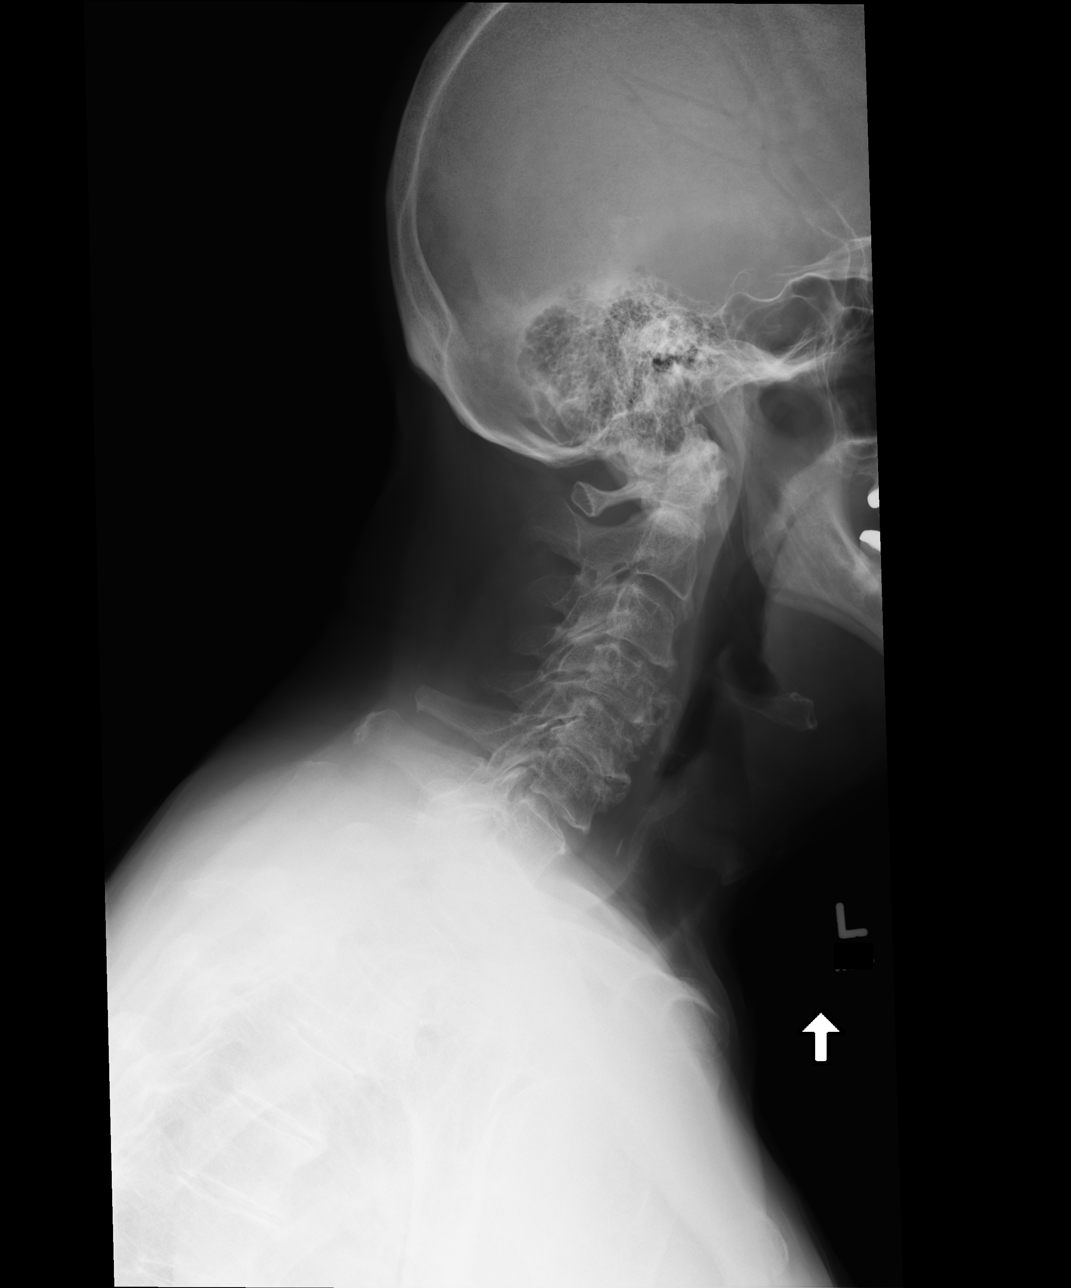
[im 2/4]
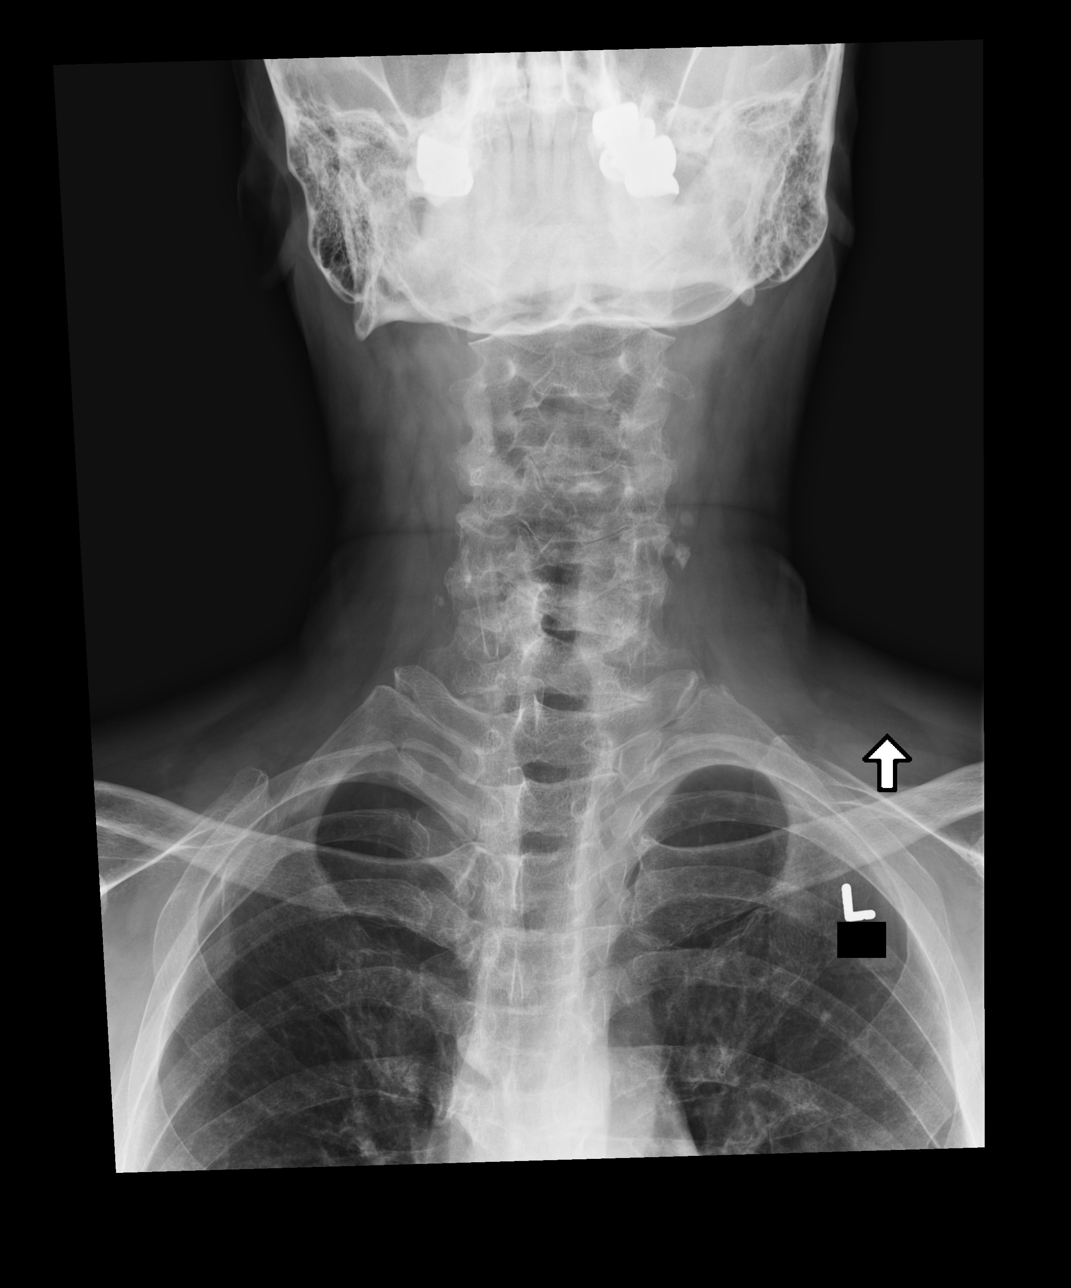
[im 3/4]
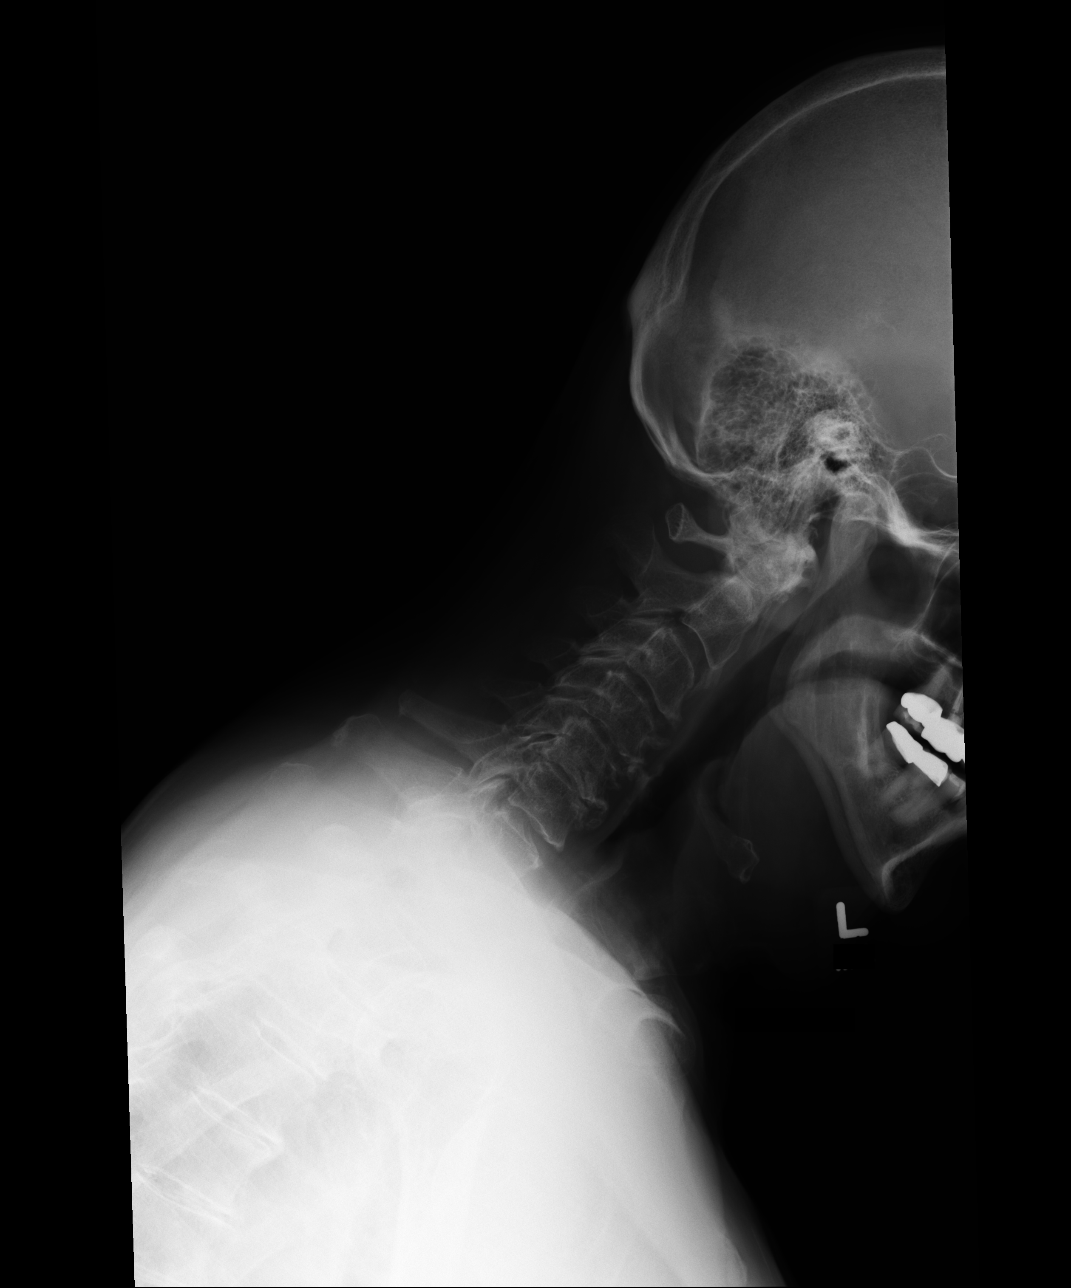
[im 4/4]
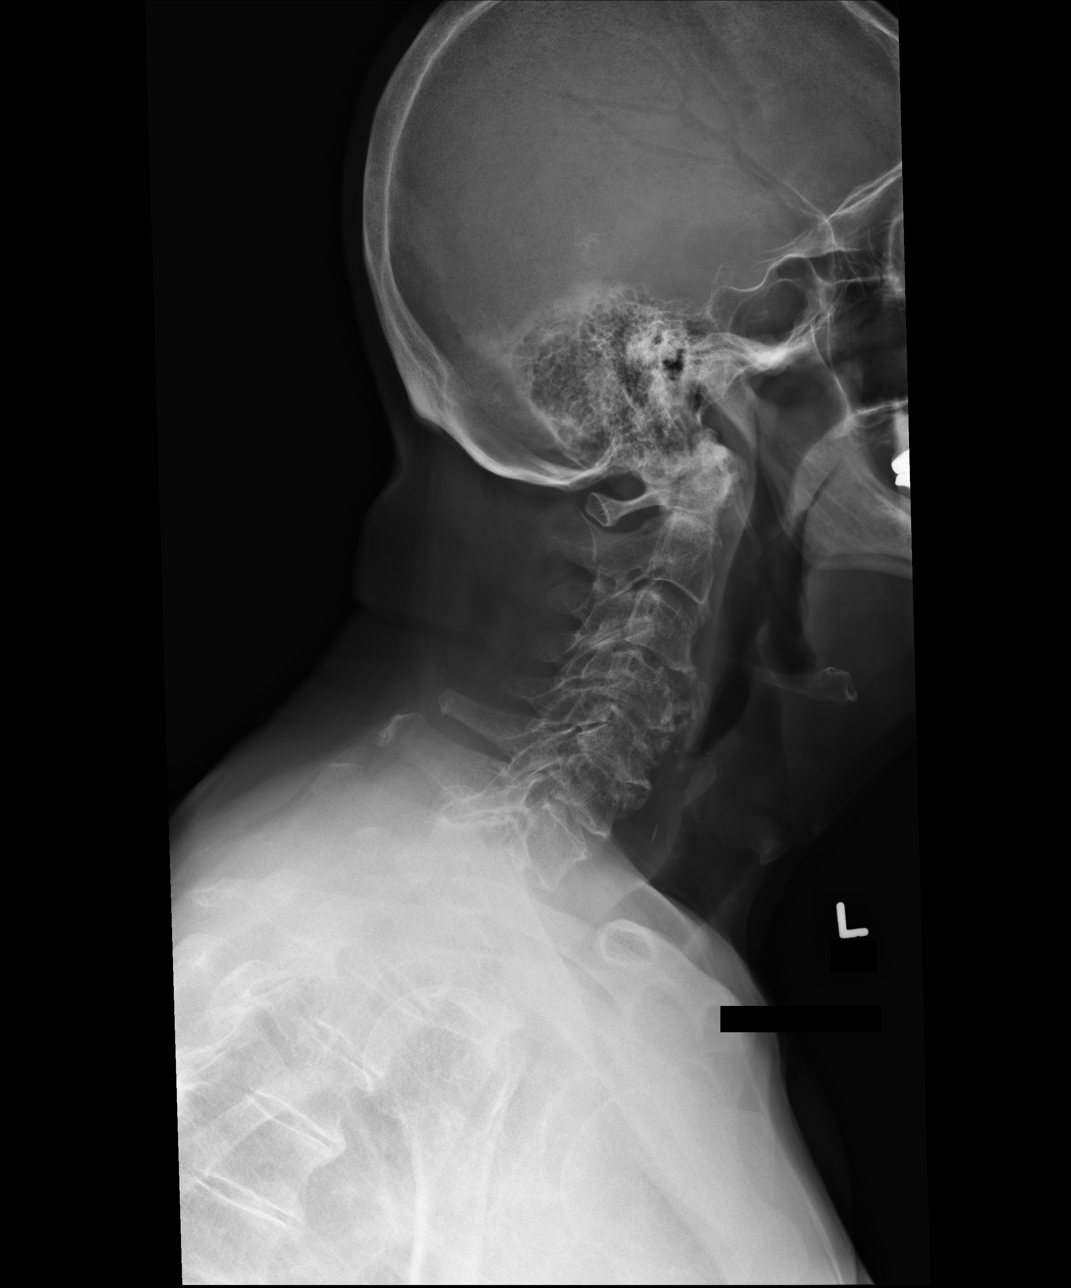

[4 of 4 positions shown; findings below may reference images not displayed]

FINDINGS: Scattered disc height loss and prominent osteophytic spurring greatest 
at C3-C6. No vertebral body fracture. No spondylolisthesis. No subluxation with 
flexion or extension positioning. Multilevel uncovertebral and facet 
hypertrophy. Carotid vascular calcifications. Lung apices are clear.
IMPRESSION: Advanced spondylotic changes cervical spine. Please see MR exam the same day.

## 2022-10-30 IMAGING — MR MRI CERVICAL SPINE WITHOUT CONTRAST
7 series · 31 of 48 positions shown · IV contrast (gadolinium)
Comparison: None

________________________________________________________________________________________________ 
MRI CERVICAL SPINE WITHOUT CONTRAST, 10/30/2022 [DATE]: 
CLINICAL INDICATION: Radiculopathy, chronic neck pain
TECHNIQUE: Sagittal T1, Sagittal T2, Sagittal STIR, Axial TSE and Axial DT99M 
images of the cervical spine were performed without intravenous gadolinium 
enhancement.

[Series 101: survey · axial · 10.0mm · 0.94mm/px · z∈[-15,+150]mm · 4 of 9 slices shown]
[im 1/9]
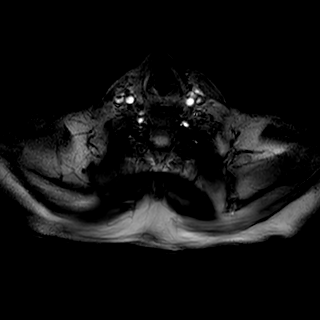
[im 3/9]
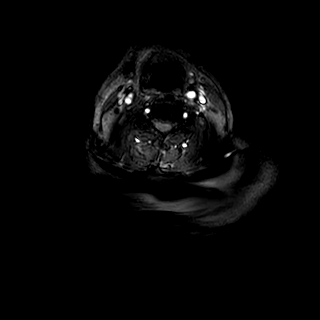
[im 6/9]
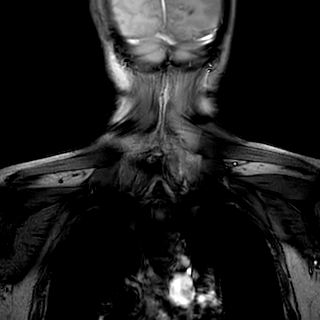
[im 9/9]
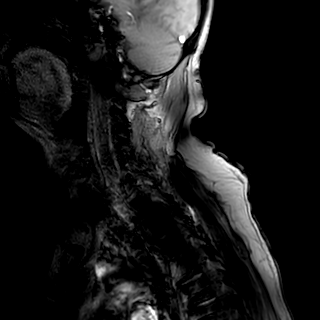

[Series 201: t2w_tse cor · coronal · 5.0mm · 0.52mm/px · 3 of 10 slices shown]
[im 1/10]
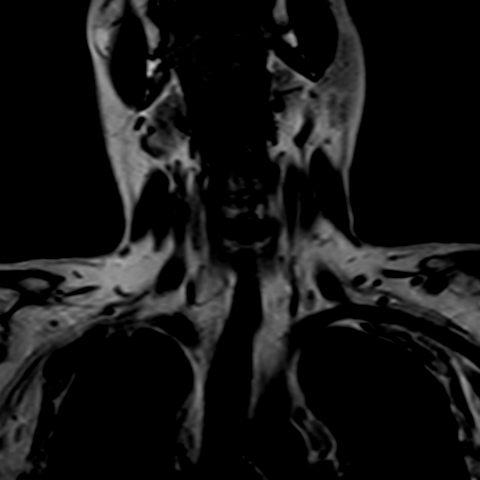
[im 5/10]
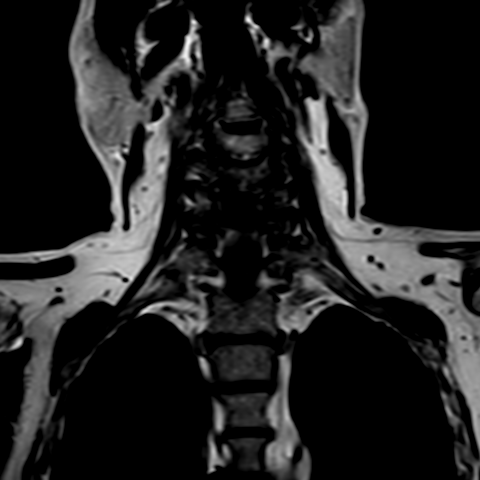
[im 10/10]
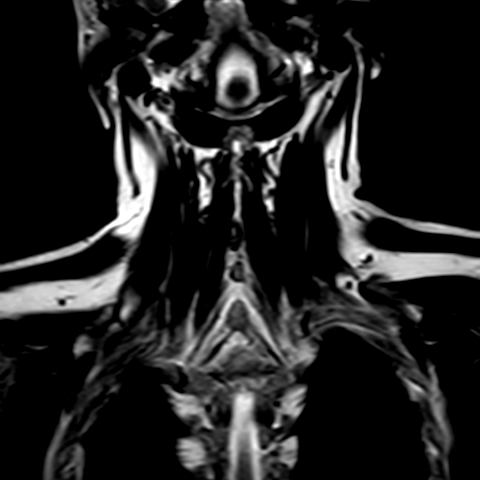

[Series 301: t1w_tse sag · sagittal · 3.0mm · 0.46mm/px · 5 of 15 slices shown]
[im 1/15]
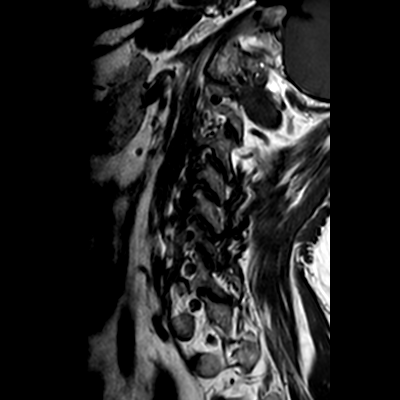
[im 4/15]
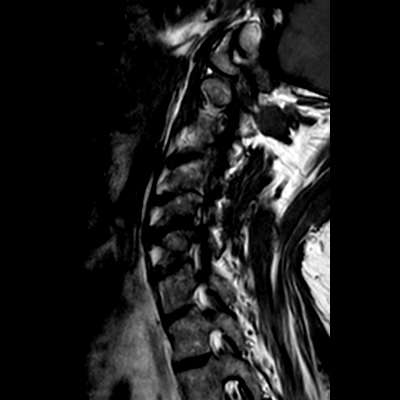
[im 8/15]
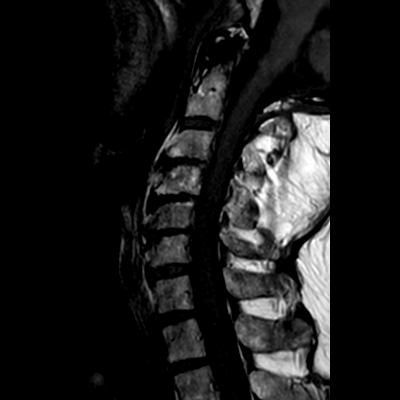
[im 11/15]
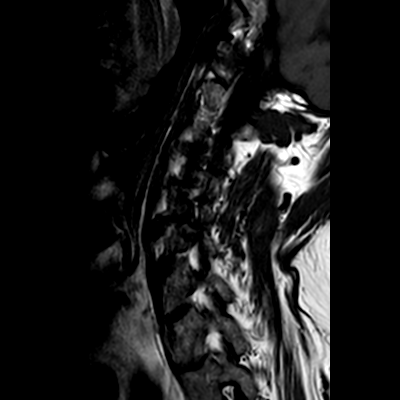
[im 15/15]
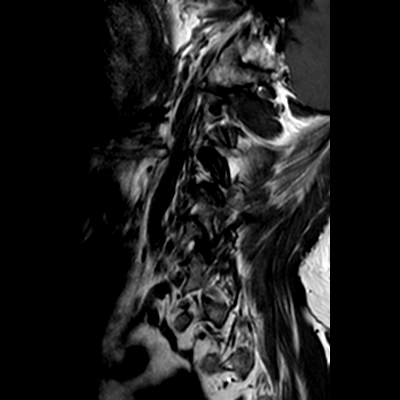

[Series 402: st2w_tse sag fs · sagittal · 3.0mm · 0.38mm/px · 5 of 15 slices shown]
[im 1/15]
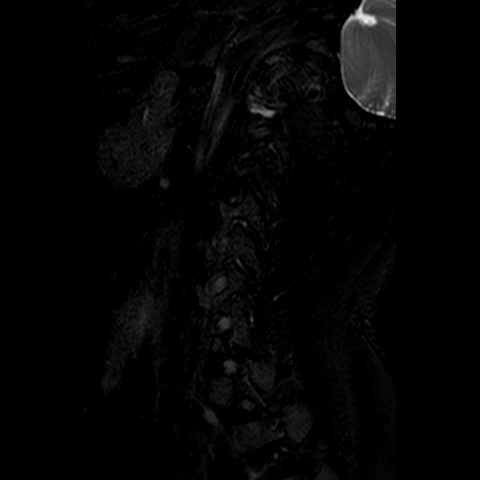
[im 4/15]
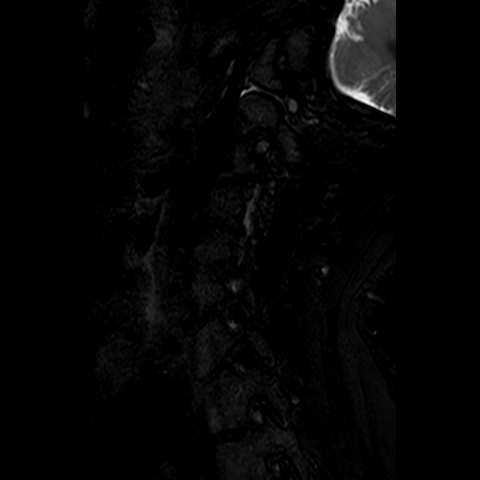
[im 8/15]
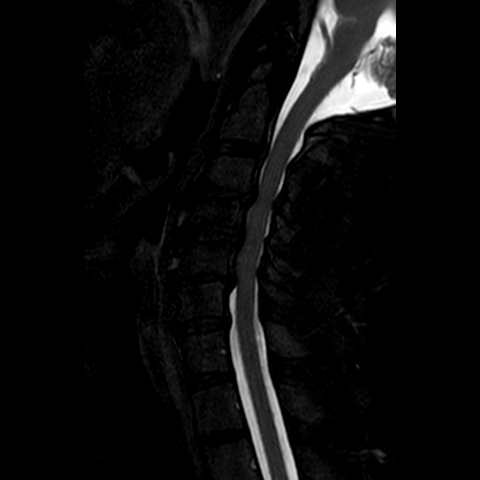
[im 11/15]
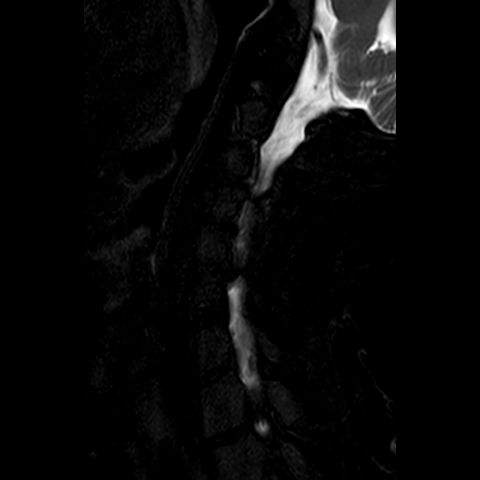
[im 15/15]
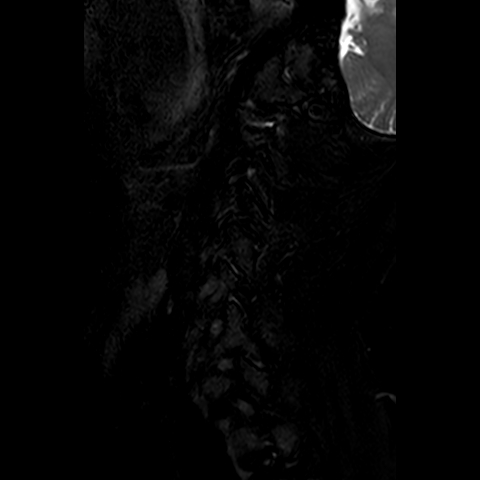

[Series 403: st2w_tse sag · sagittal · 3.0mm · 0.38mm/px · 5 of 15 slices shown]
[im 1/15]
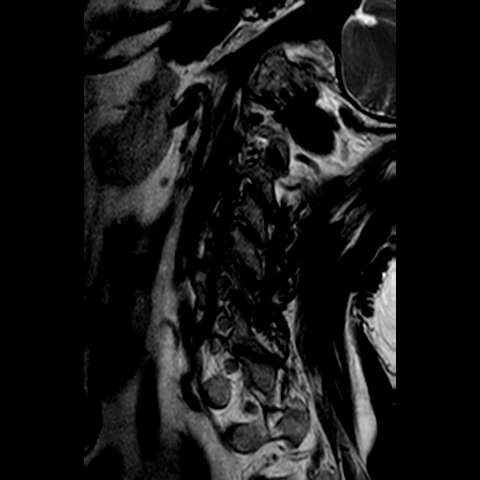
[im 4/15]
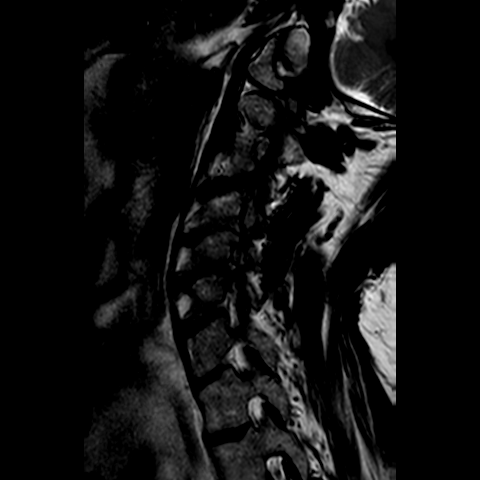
[im 8/15]
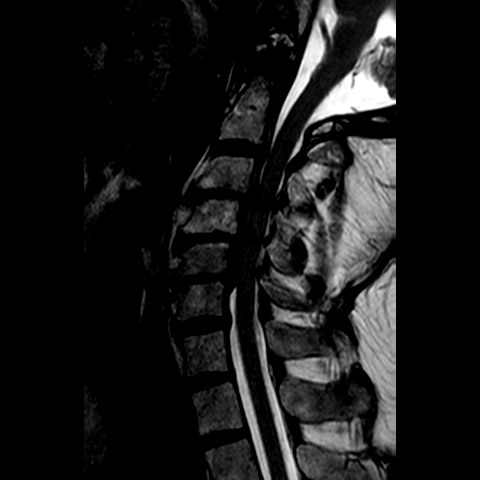
[im 11/15]
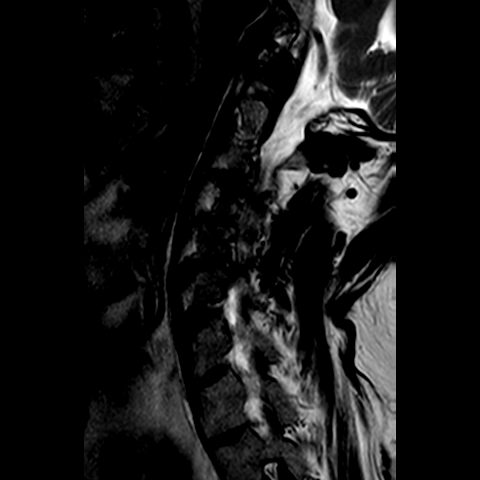
[im 15/15]
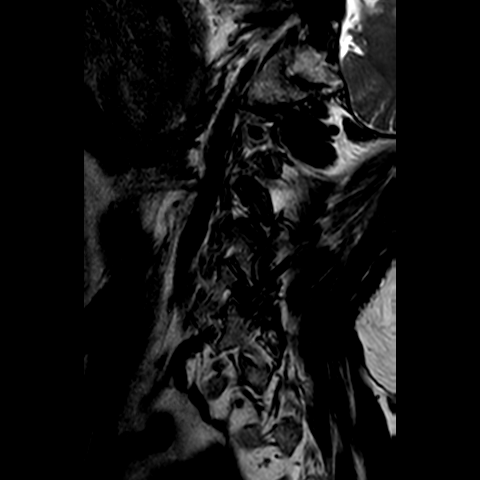

[Series 501: GRE · axial · 3.5mm · 0.67mm/px · z∈[-80,+39]mm · 8 of 30 slices shown]
[im 1/30]
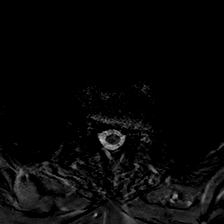
[im 4/30]
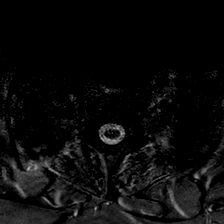
[im 10/30]
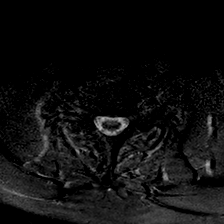
[im 13/30]
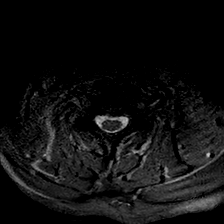
[im 17/30]
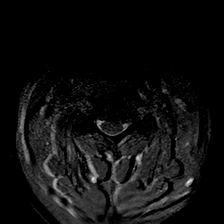
[im 20/30]
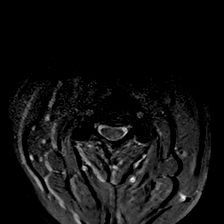
[im 26/30]
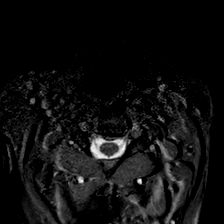
[im 30/30]
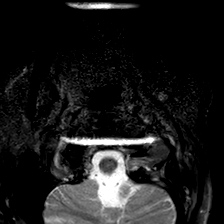

[Series 601: t2w_tse_ax · axial · 3.0mm · 0.32mm/px · 1 of 45 slices shown]
[im 3/45]
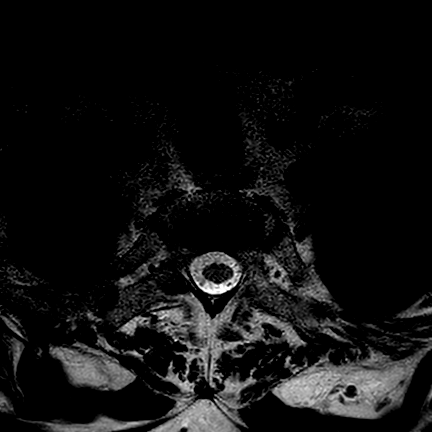

[31 of 48 positions shown; findings below may reference images not displayed]

FINDINGS: Cervical vertebral heights are intact. There is spondylosis 
superimposed on short posterior elements. There is marked disc narrowing from C3 
through C6. There is disc-osteophyte at these levels, producing deformity of the 
ventral cord. Canal diameter at C3-4 is 5.5 mm, C4-5 6 mm, C5-6 7 mm. At C2-3 
Canal diameter is 7.5 mm, at C6-7 9.5 mm. 
Axial images show moderate left, mild right foraminal stenosis at C3-4. There is 
marked left, mild right foraminal stenosis at C4-5. Marked bilateral foraminal 
stenosis at C5-6. At C6-7 there is mild left foraminal stenosis. Other foramina 
are open. 
Cord signal appears normal. The dens is intact. The craniocervical junction is 
open. There is minimal Modic type I change at C5-6. There are multilevel 
zygapophyseal facet changes.
IMPRESSION: Moderately advanced spondylosis superimposed on short posterior elements. There 
is moderately severe canal stenosis at C3-4, C4-5 and C5-6, with cord deformity 
at each level due to disc bulge and endplate osteophyte, also dorsal ligamentous 
thickening. There is multilevel foraminal stenosis, most pronounced bilaterally 
at C5-6, on the left at C4-5. 
Cord signal appears normal. There is no evidence for fracture or spinal 
malignancy.

## 2023-02-28 IMAGING — CT CT CERVICAL SPINE WITHOUT CONTRAST
4 of 5 series · 15 of 33 positions shown, 17 images · non-contrast
Comparison: Cervical MRI October 30, 2022    
Count of known CT and Cardiac Nuclear Medicine studies performed in the previous 
12 months = 2

________________________________________________________________________________________________ 
CT CERVICAL SPINE WITHOUT CONTRAST, 02/28/2023 [DATE]: 
CLINICAL INDICATION: Neck pain extending to shoulder with bilateral hand 
numbness and tingling 
A search for DICOM formatted images was conducted for prior CT imaging studies 
completed at a non-affiliated media free facility.
TECHNIQUE: The cervical spine was scanned from the skull base through T1 
vertebra without contrast on a high-resolution CT scanner using dose reduction 
techniques. Routine MPR reconstructions were performed. 3D images were performed 
on an Independent Workstation under physician supervision.

[Series 2: axial · axial · 0.30mm/px · z∈[-205,-82]mm · 5 of 185 slices shown, 7 images]
[im 31/185  soft-tissue]
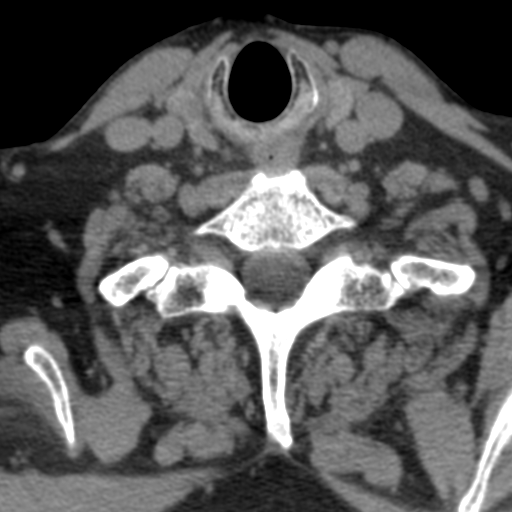
[im 31/185  bone]
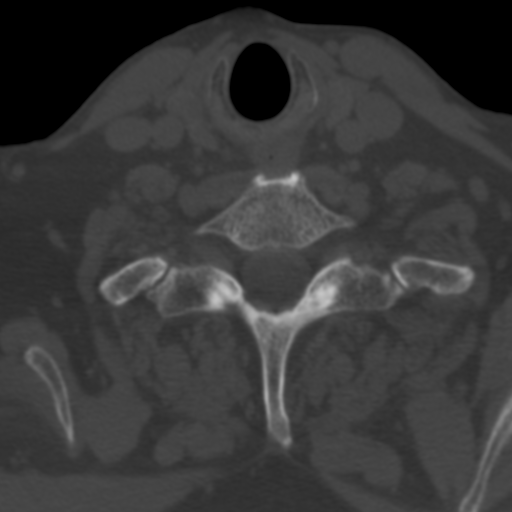
[im 62/185  bone]
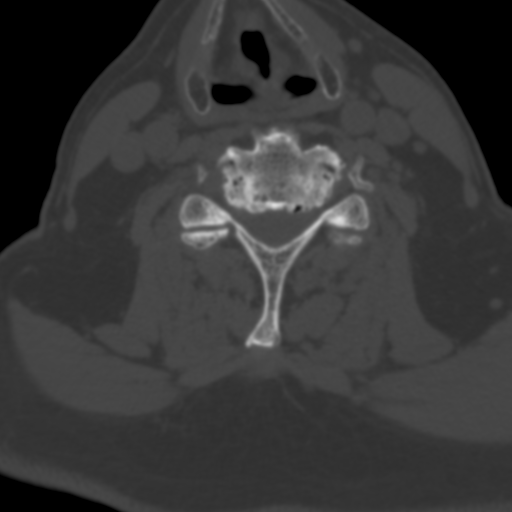
[im 93/185  bone]
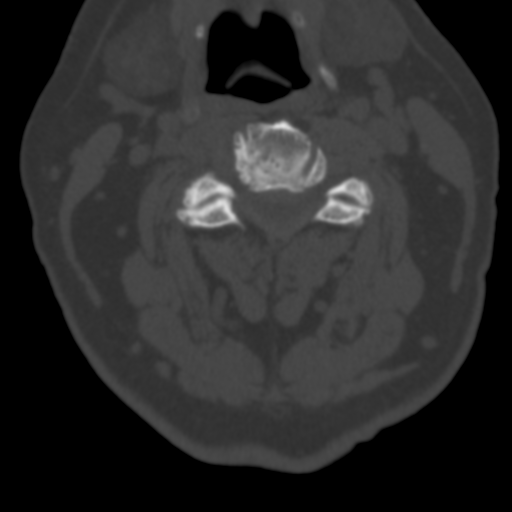
[im 123/185  bone]
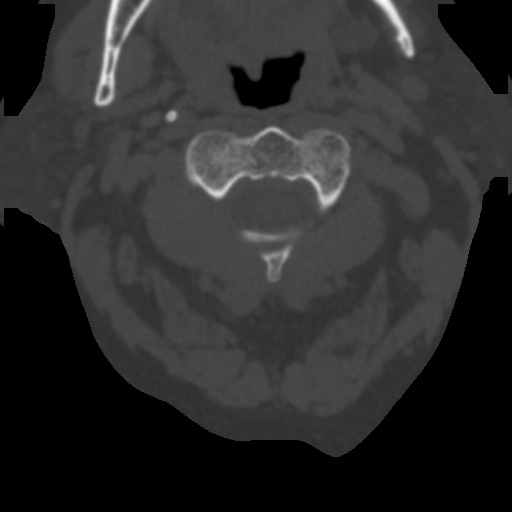
[im 154/185  soft-tissue]
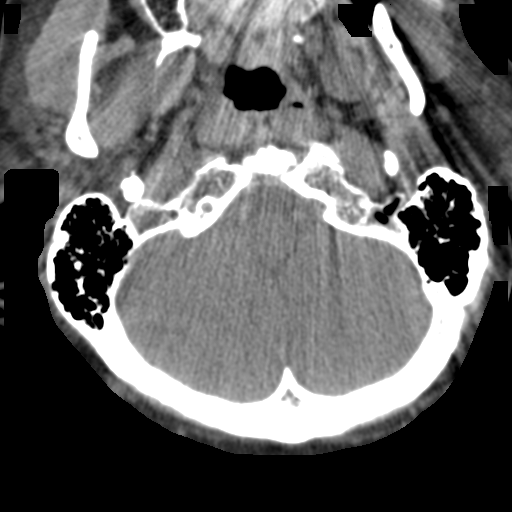
[im 154/185  bone]
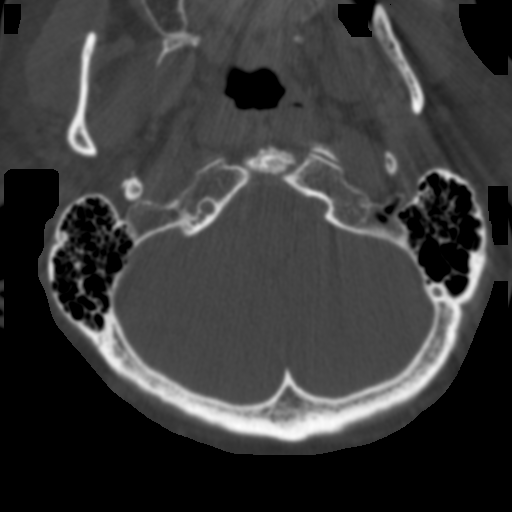

[Series 4: cor · coronal · 0.26mm/px · 3 of 121 slices shown]
[im 25/121  bone]
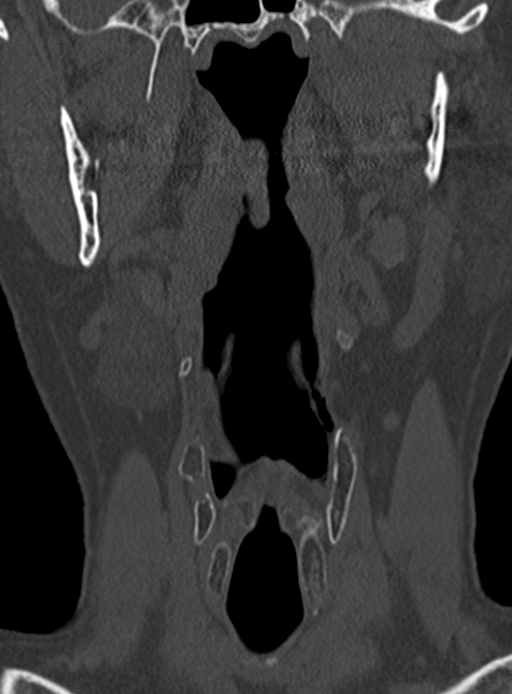
[im 49/121  bone]
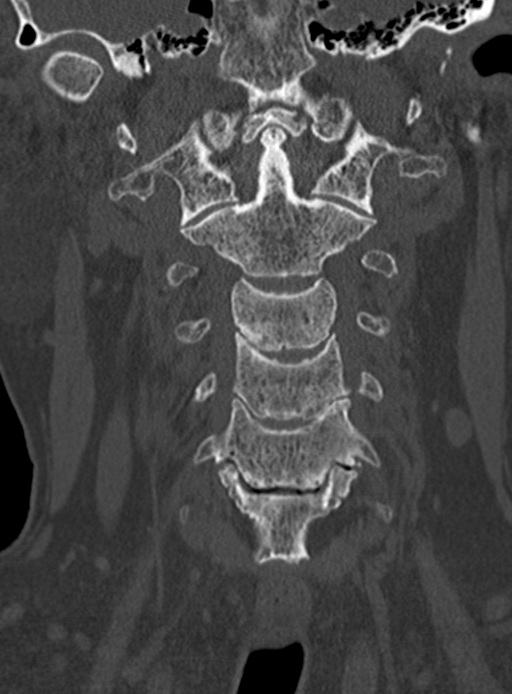
[im 73/121  bone]
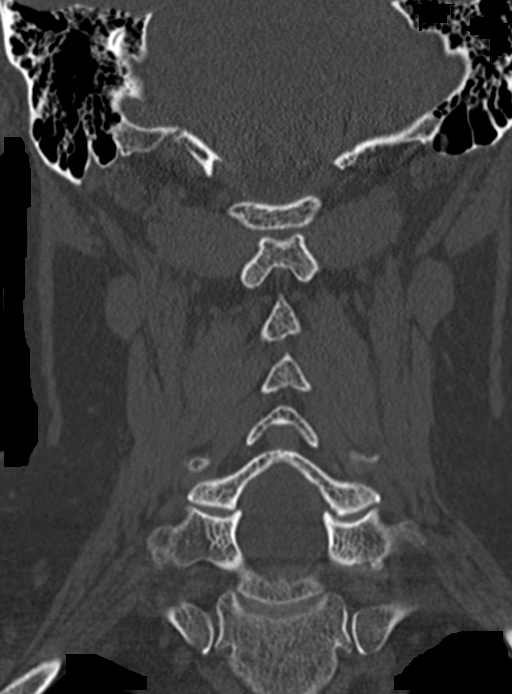

[Series 5: sag · sagittal · 0.36mm/px · 5 of 118 slices shown]
[im 20/118  bone]
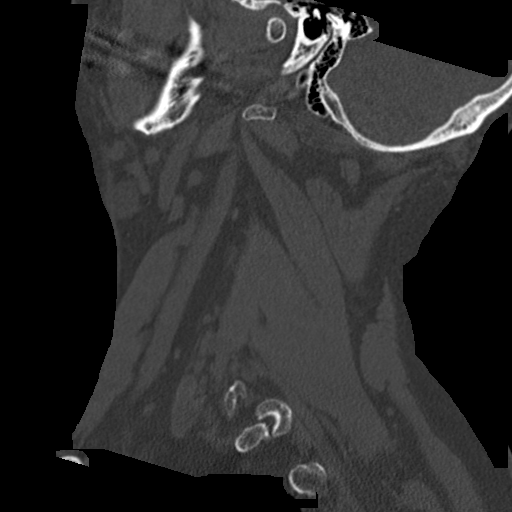
[im 40/118  bone]
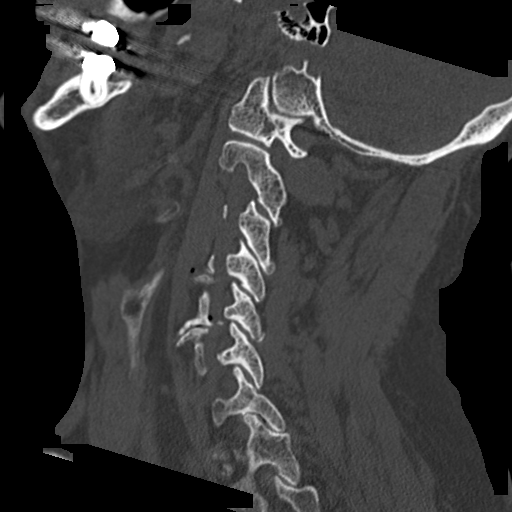
[im 59/118  bone]
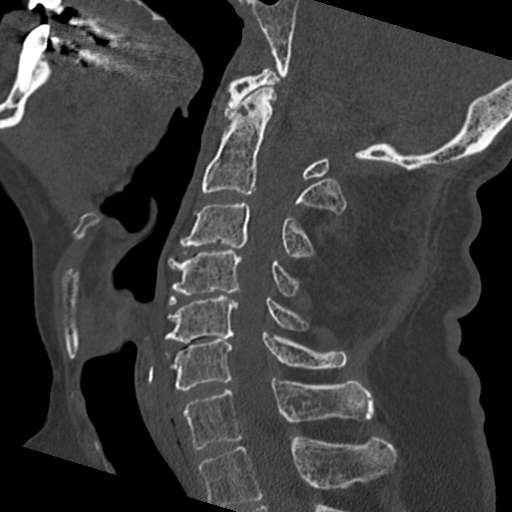
[im 79/118  bone]
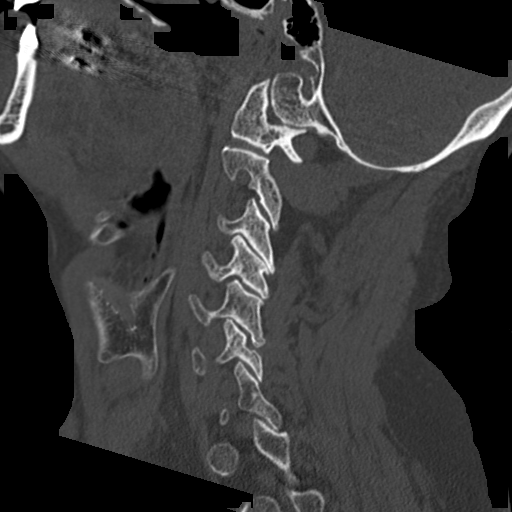
[im 98/118  bone]
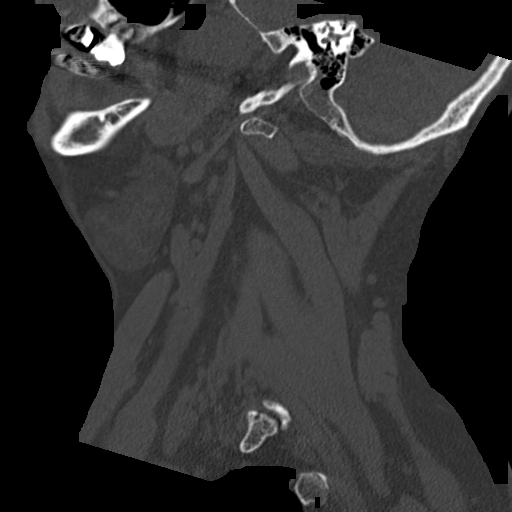

[Series 8: axial multi · axial · 0.23mm/px · z∈[-233,-202]mm · 2 of 186 slices shown]
[im 31/186  bone]
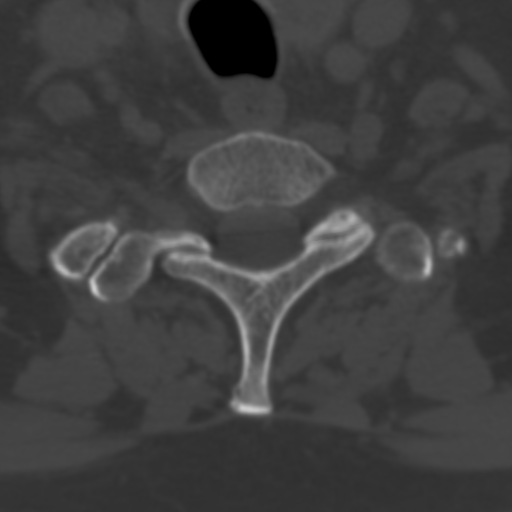
[im 62/186  bone]
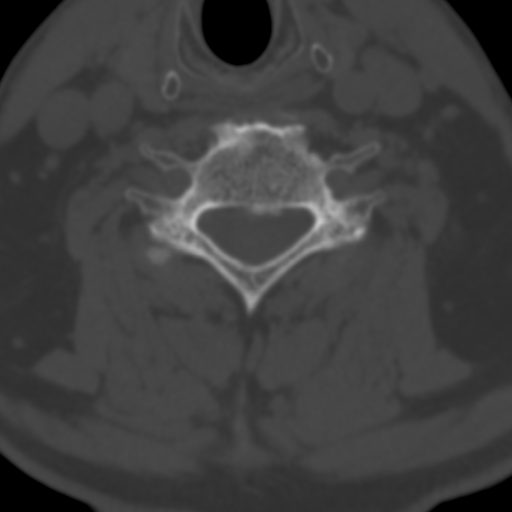

[15 of 33 positions shown; findings below may reference images not displayed]

FINDINGS: Cervical vertebral heights are intact. Marked disc narrowing C3-C6. There is 
canal stenosis from C2 through C6 7, grossly similar to the prior MRI. There is 
disc bulging at C2-3 and C3-4 with canal diameter 6 mm. There is disc-osteophyte 
at C4-5 and C5-6 with canal diameter 6.5 mm. There is mild canal stenosis at 
C6-7. 
Axial bone window images show moderate to marked left foraminal stenosis at 
C4-5. No other significant bony foraminal stenosis. Soft tissue axial images 
121-126 show at least moderately severe bilateral stenosis at C5-6, not apparent 
on the corresponding bone window images. The MRI also showed bilateral stenosis 
at C5-6. Other foramina appear open. 
The dens is intact. The craniocervical junction is open. No bone destruction or 
compression fracture. There is no subluxation. 
There is carotid bulb calcific plaque, left greater than right.
IMPRESSION: Spondylosis superimposed on short posterior elements. Moderately severe canal 
stenosis from C2 through C5-6. There is left C4-5 and bilateral C5-6 foraminal 
stenosis. Similar findings on the prior MRI. 
No evidence for fracture, subluxation or bone destruction. 
Moderate carotid bulb calcification, left greater than right. Duplex Doppler 
ultrasound or carotid CTA would be useful to better evaluate lumen diameter. 
RADIATION DOSE REDUCTION: All CT scans are performed using radiation dose 
reduction techniques, when applicable.  Technical factors are evaluated and 
adjusted to ensure appropriate moderation of exposure.  Automated dose 
management technology is applied to adjust the radiation doses to minimize 
exposure while achieving diagnostic quality images.

## 2023-04-27 IMAGING — MR MRI RIGHT SHOULDER WITHOUT CONTRAST
4 of 6 series · 23 of 40 positions shown · IV contrast (gadolinium)
Comparison: None

________________________________________________________________________________________________ 
MRI RIGHT SHOULDER WITHOUT CONTRAST, 04/27/2023 [DATE]: 
CLINICAL INDICATION: Other Shoulder Lesions, Right Shoulder
TECHNIQUE: Multiplanar, multiecho position MR images of the shoulder were 
performed without intravenous gadolinium enhancement. Patient was scanned on a 
3T magnet.

[Series 201: survey_right · axial · right · 10.0mm · 0.99mm/px · z∈[-40,+175]mm · 3 of 15 slices shown]
[im 1/15]
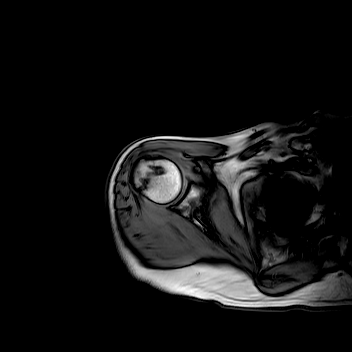
[im 8/15]
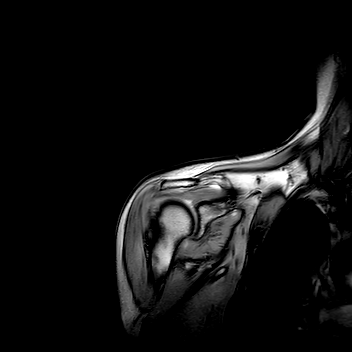
[im 15/15]
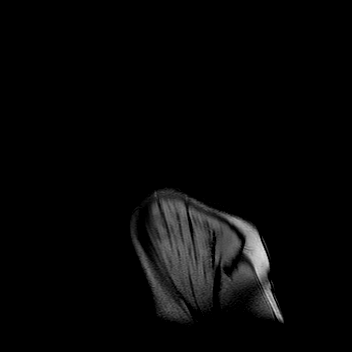

[Series 301: pdw spair right · axial · right · 2.5mm · 0.27mm/px · z∈[-116,-26]mm · 9 of 36 slices shown]
[im 1/36]
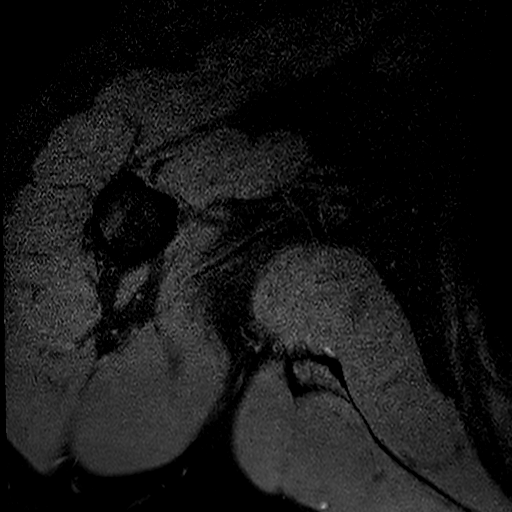
[im 5/36]
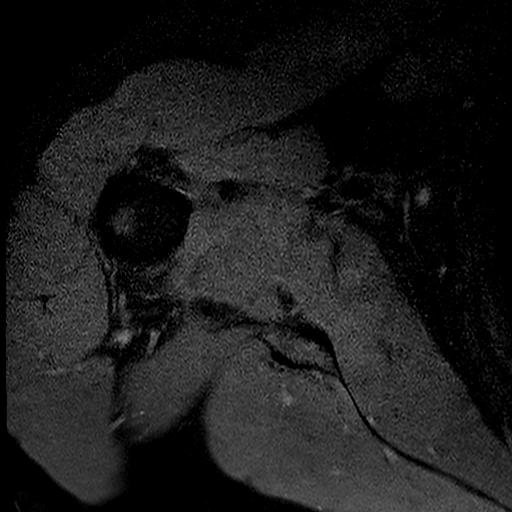
[im 9/36]
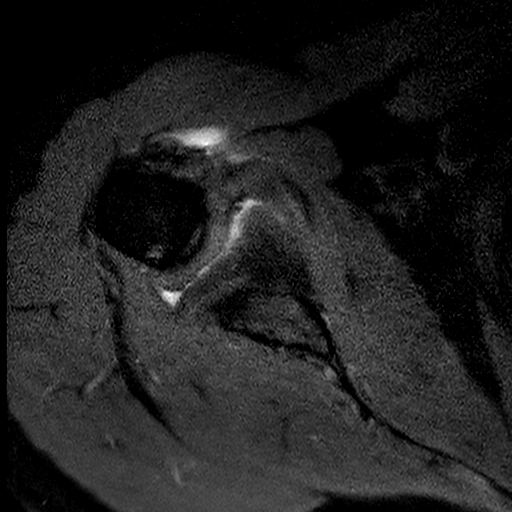
[im 14/36]
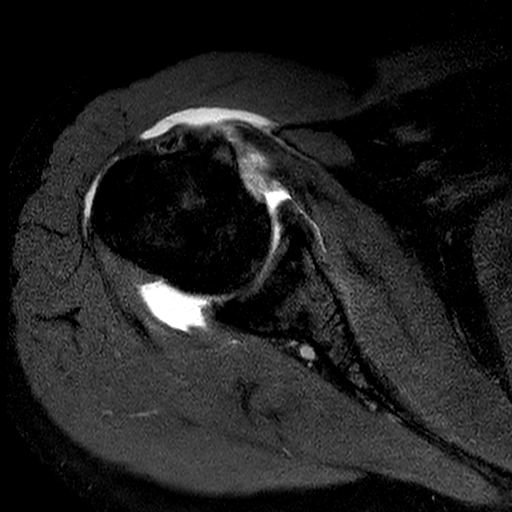
[im 18/36]
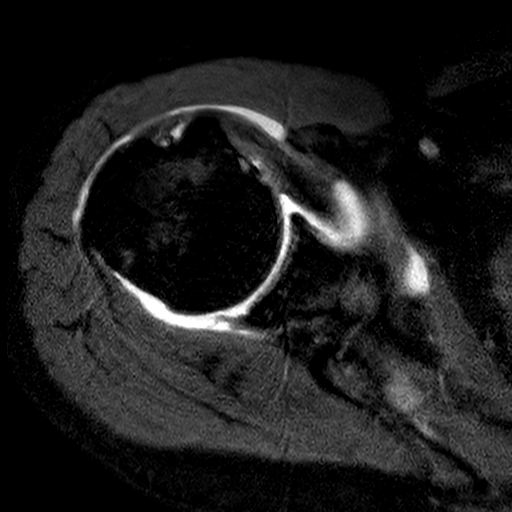
[im 22/36]
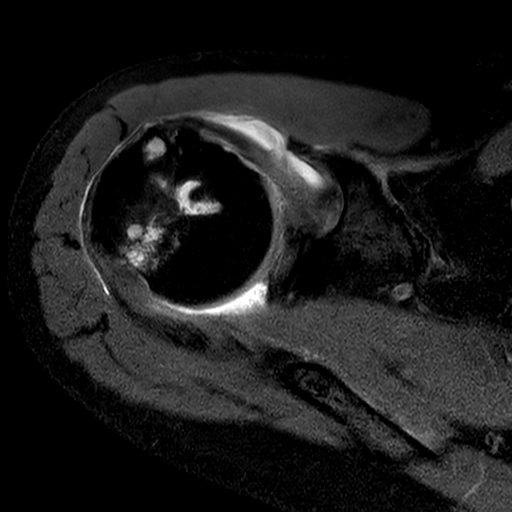
[im 27/36]
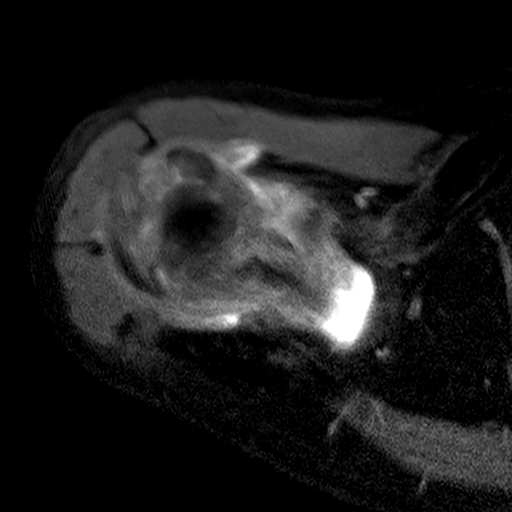
[im 31/36]
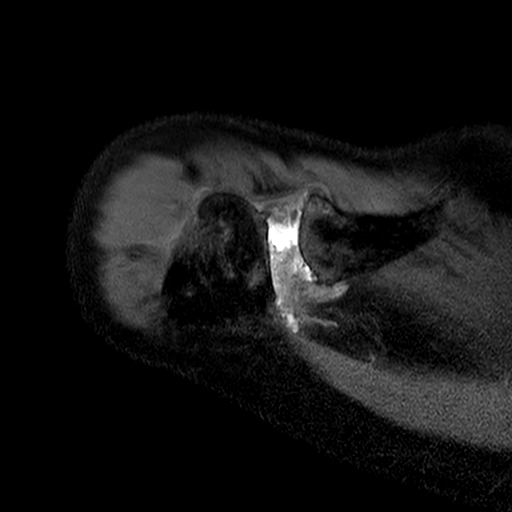
[im 36/36]
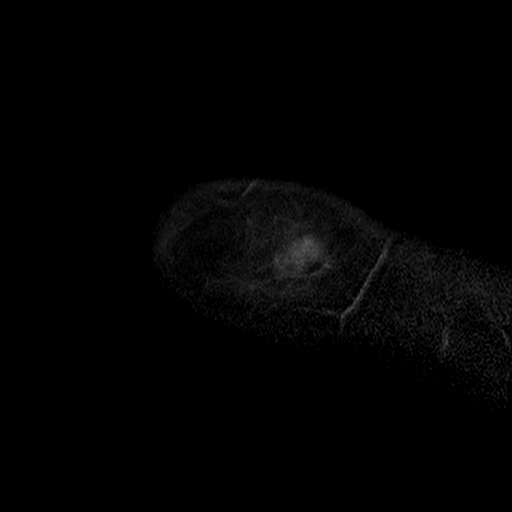

[Series 401: t2w spair right · sagittal · right · 3.2mm · 0.38mm/px · 7 of 28 slices shown (1 of 2)]
[im 1/28]
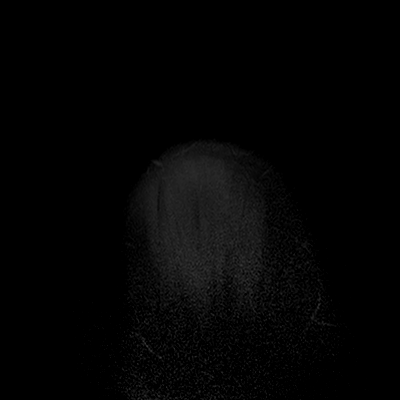
[im 5/28]
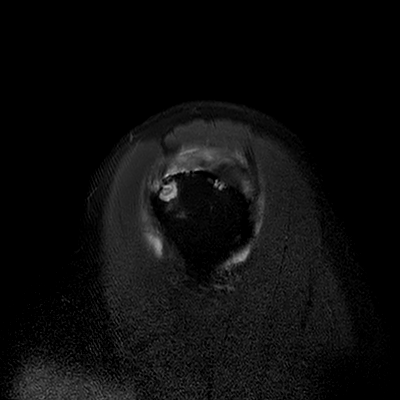
[im 10/28]
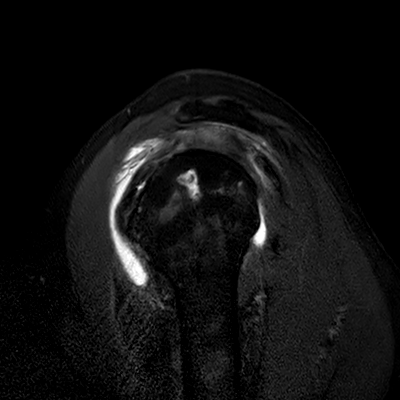
[im 14/28]
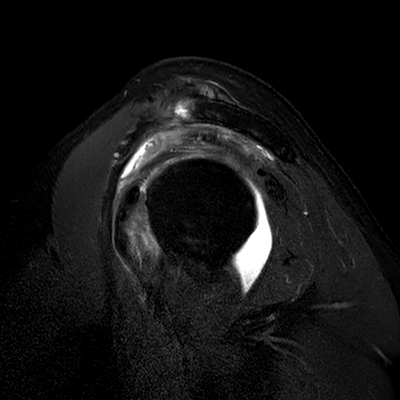
[im 19/28]
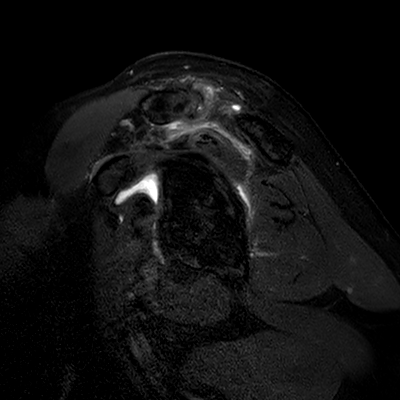
[im 23/28]
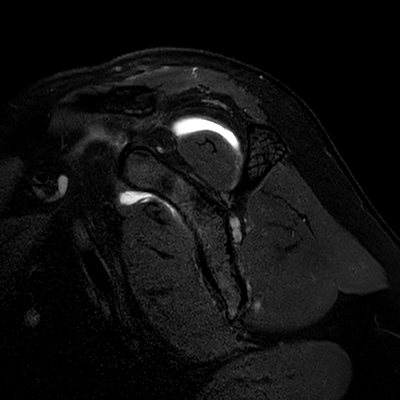
[im 28/28]
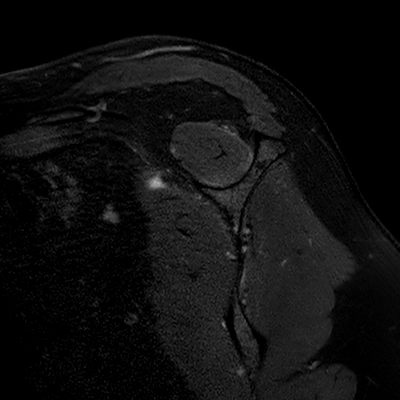

[Series 501: t2w spair right · oblique · right · 2.5mm · 0.38mm/px · 4 of 26 slices shown (2 of 2)]
[im 1/26]
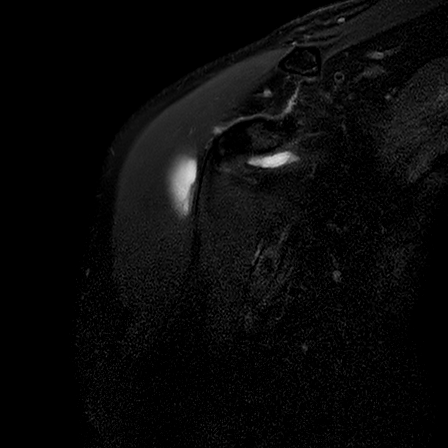
[im 5/26]
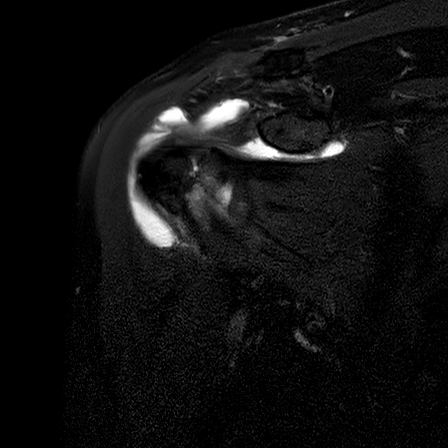
[im 13/26]
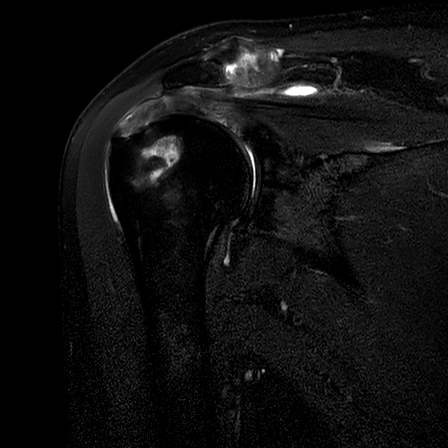
[im 21/26]
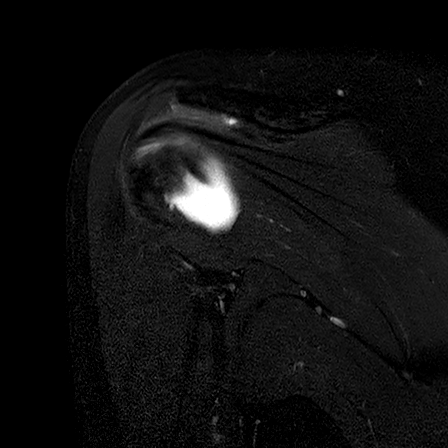

[23 of 40 positions shown; findings below may reference images not displayed]

FINDINGS: ROTATOR CUFF: Small full-thickness tear of the critical zone of the 
supraspinatus tendon measures 0.5 cm in AP dimensions. 0.5 cm high-grade 
articular sided distal supraspinatus tendon tear. Several partial-thickness 
low-grade articular sided distal supraspinatus tendon tears. Small low-grade 
partial-thickness distal subscapularis tendon tear measures 0.4 cm in AP 
dimensions. 0.2 cm low-grade partial-thickness concealed, intrasubstance distal 
infraspinatus tendon tear. Diffuse tendinosis. The teres minor tendon is intact. 
The rotator cuff musculature is symmetric without mass, signal abnormality or 
atrophy. 
ACROMIOCLAVICULAR JOINT: Mild degenerative change of the acromioclavicular 
joint, joint effusion and synovitis with mild mass effect upon the torn 
supraspinatus. The coracoacromial ligament is intact without prominent spurring 
at the acromial attachment. The acromioclavicular and coracoclavicular ligaments 
are preserved. The acromium is normal in morphology. 
GLENOHUMERAL JOINT: The humeral head is well located within the glenoid fossa. 
Mild glenohumeral joint degenerative change with partial thickness 
chondromalacia and degenerative labral tears. No paralabral cyst. Torn proximal 
long head of the biceps tendon with marked thinning of the tendinous remnant in 
the bicipital groove. Moderate glenohumeral joint effusion with extension into 
the subacromial/subdeltoid bursa. 
BONES: No fracture or Hill-Sachs defect. 1.9 x 0.7 x 1.1 cm focus of central 
humeral head osteonecrosis. Humeral head subcortical cysts. Subcortical cystic 
change of the humeral head. 
ADDITIONAL FINDINGS: The axillary region is negative. Subcutaneous tissues are 
negative.
IMPRESSION: 1.  0.5 cm full-thickness tear of the critical zone of the supraspinatus tendon, 
0.5 cm high-grade articular sided distal tendon tear, several partial-thickness 
low-grade articular sided tendon tears and tendinosis.  
2.  Small low-grade partial-thickness distal subscapularis/infraspinatus tendon 
tear tears and tendinosis.  
3.  Torn proximal long head of the biceps tendon with marked thinning of the 
tendinous remnant in the bicipital groove.  
4.  Mild glenohumeral joint degenerative change, labral tears and moderate joint 
effusion. 
5.  Mild AC joint degenerative change with mild mass effect upon the torn 
supraspinatus. 
6.  1.9 x 0.7 x 1.1 cm focus of central humeral head osteonecrosis.

## 2023-07-18 IMAGING — MR MRI LEFT SHOULDER WITHOUT CONTRAST
5 of 6 series · 27 of 40 positions shown · IV contrast (gadolinium)
Comparison: None prior of the left shoulder.

________________________________________________________________________________________________ 
MRI LEFT SHOULDER WITHOUT CONTRAST, 07/18/2023 [DATE]: 
CLINICAL INDICATION: Patient fell last Saturday injuring left shoulder. Painful 
to left. Patient is 6 weeks status post right shoulder repair.
TECHNIQUE: Multiplanar, multiecho position MR images of the shoulder were 
performed without intravenous gadolinium enhancement. Patient was scanned on a 
1.5T magnet.

[Series 201: survey left · axial · left · 10.0mm · 0.71mm/px · z∈[-40,+125]mm · 5 of 15 slices shown]
[im 1/15]
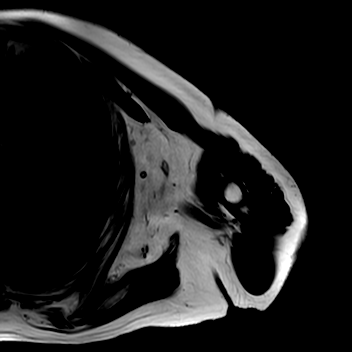
[im 4/15]
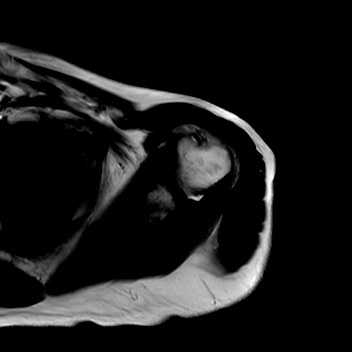
[im 8/15]
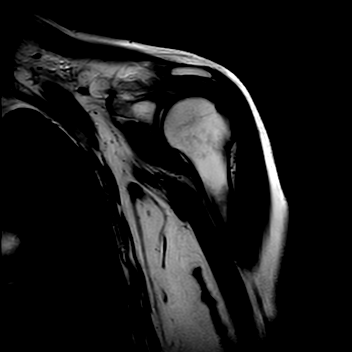
[im 11/15]
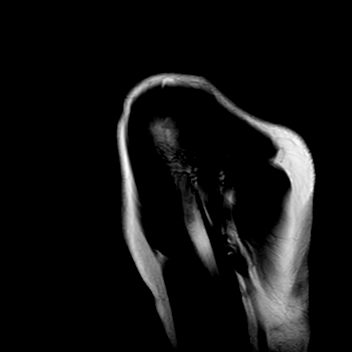
[im 15/15]
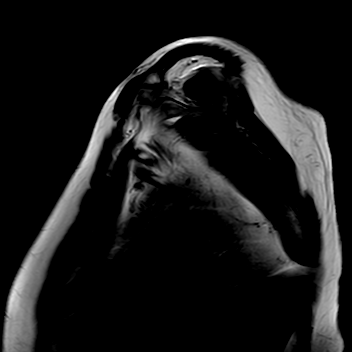

[Series 301: (person_name)_(person_name)_(person_name) · axial · left · 3.5mm · 0.42mm/px · z∈[-46,+60]mm · 7 of 28 slices shown]
[im 1/28]
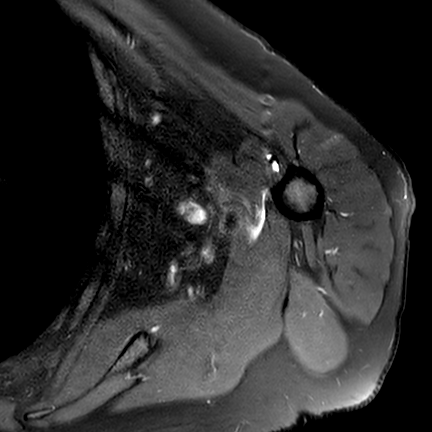
[im 5/28]
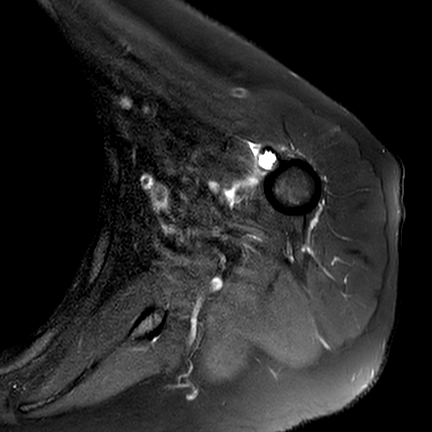
[im 10/28]
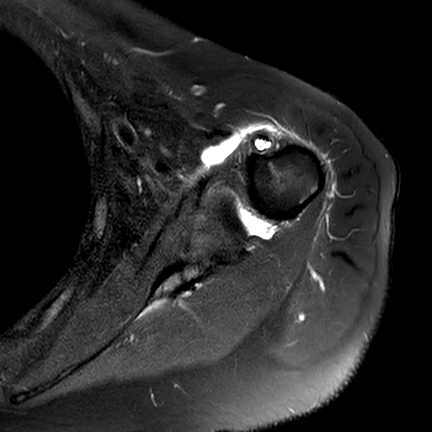
[im 14/28]
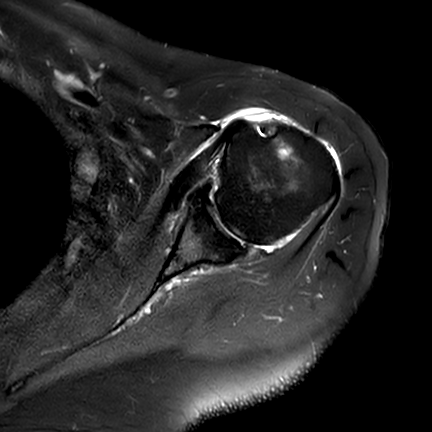
[im 19/28]
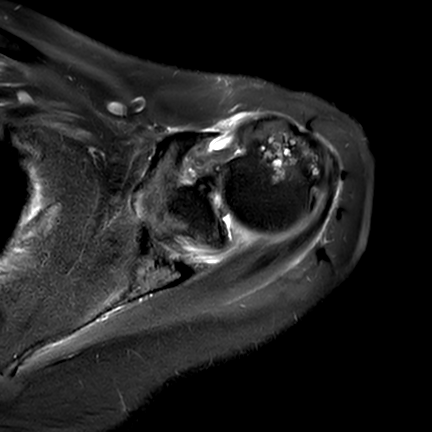
[im 23/28]
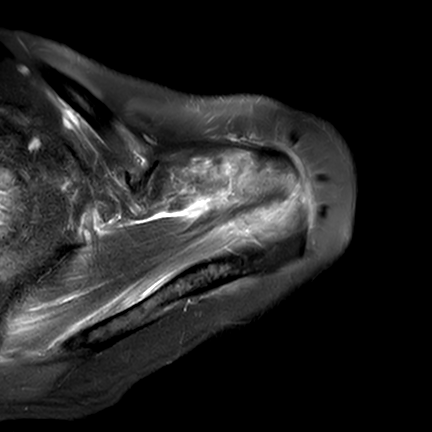
[im 28/28]
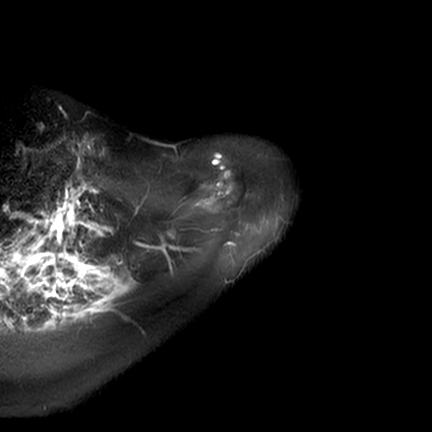

[Series 401: t2_fs_sag left · sagittal · left · 3.5mm · 0.37mm/px · 8 of 30 slices shown]
[im 1/30]
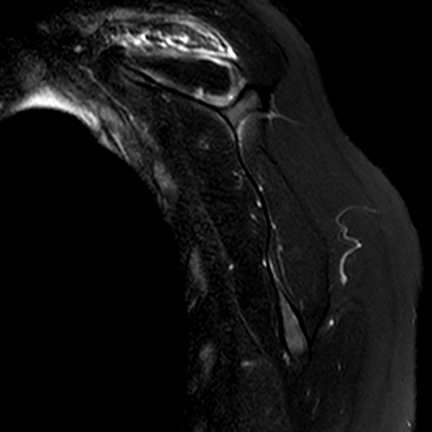
[im 5/30]
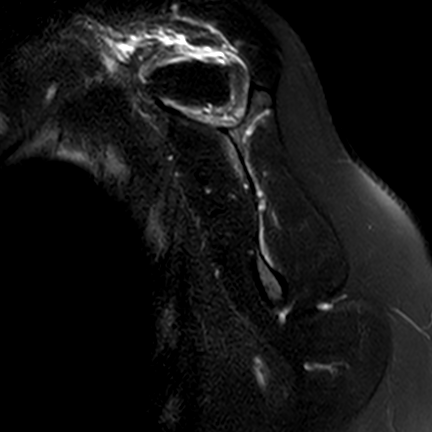
[im 9/30]
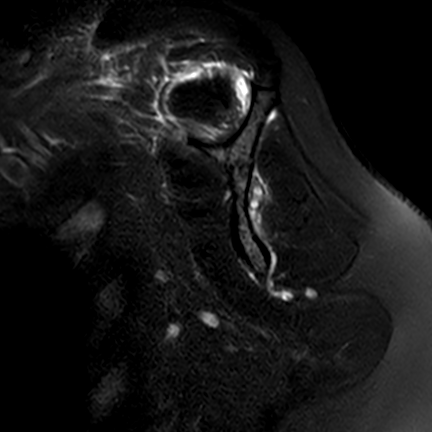
[im 13/30]
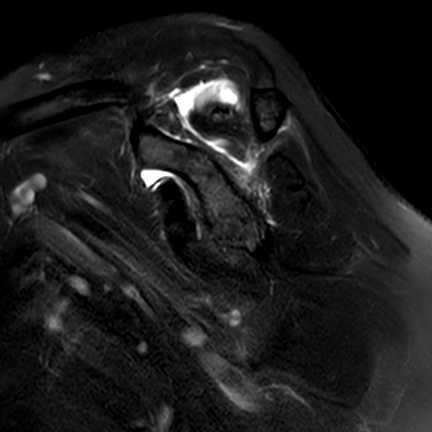
[im 17/30]
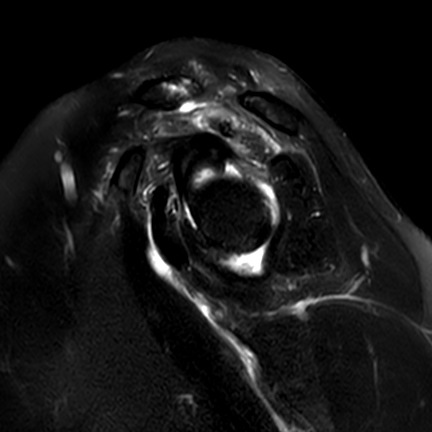
[im 21/30]
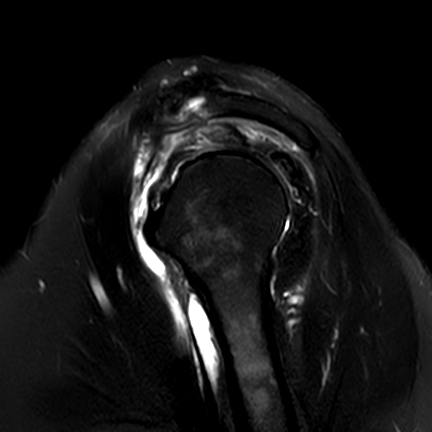
[im 25/30]
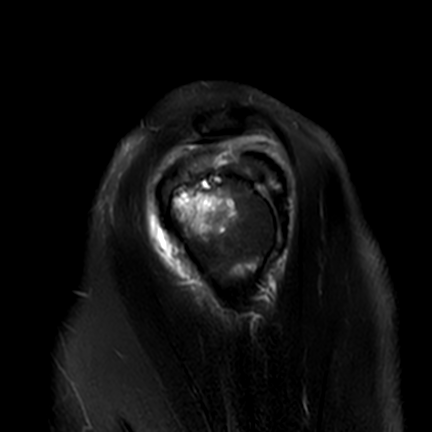
[im 30/30]
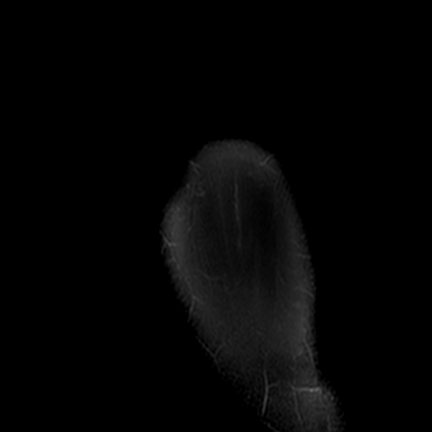

[Series 501: pd_fs_cor left · oblique · left · 3.5mm · 0.40mm/px · 6 of 24 slices shown]
[im 1/24]
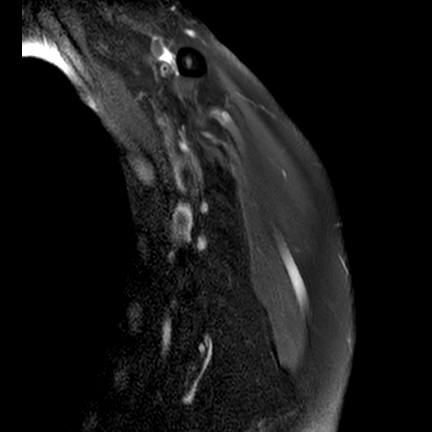
[im 5/24]
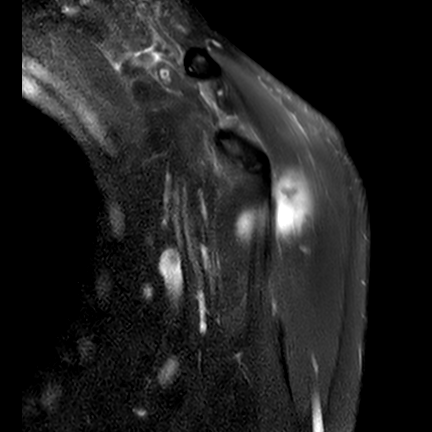
[im 10/24]
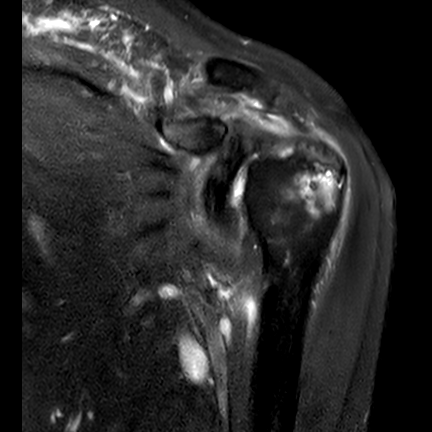
[im 14/24]
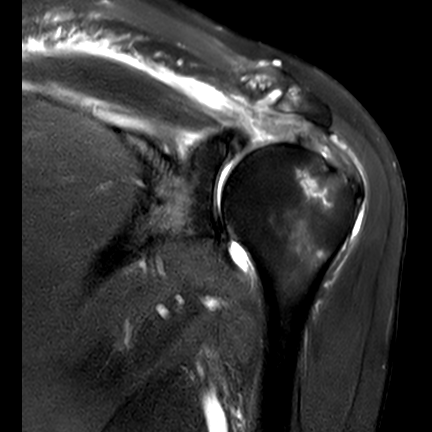
[im 19/24]
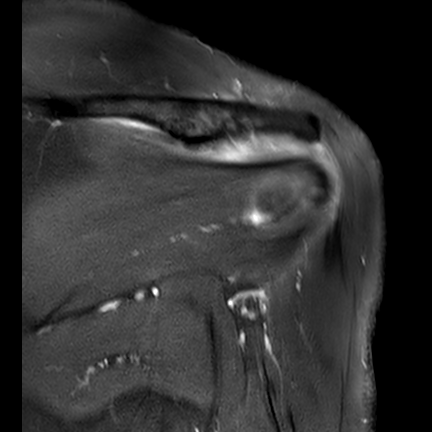
[im 24/24]
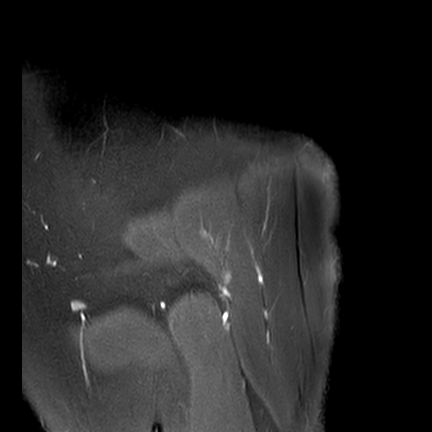

[Series 601: t1_sag left · sagittal · left · 3.5mm · 0.31mm/px · 1 of 30 slices shown]
[im 1/30]
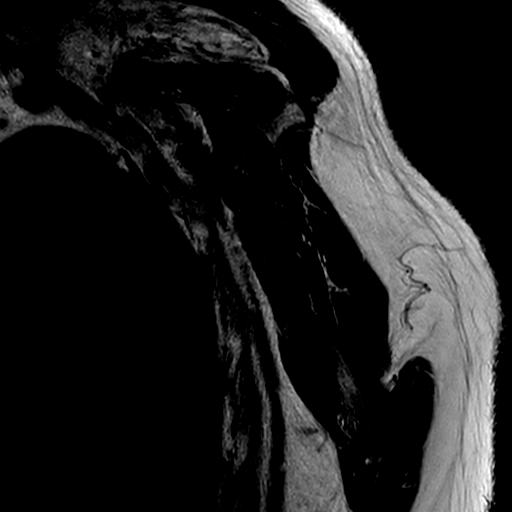

[27 of 40 positions shown; findings below may reference images not displayed]

FINDINGS: ROTATOR CUFF: There is edema like signal intensity within and about the 
supraspinatus muscle with muscular strain. There is full-thickness tear of a 
component of the supraspinatus tendon measuring approximately 4 cm from its 
insertion with underlying supraspinatus tendinosis. There is high-grade partial 
tear of the majority of the subscapularis insertion without associated muscular 
atrophy or edema. The teres minor and infraspinatus tendons and muscles are 
preserved. 
ACROMIOCLAVICULAR JOINT: Mild hypertrophic changes of the acromioclavicular 
joint without mass effect upon the supraspinatus. The coracoacromial ligament is 
intact without prominent spurring at the acromial attachment. The 
acromioclavicular and coracoclavicular ligaments are preserved. The acromium is 
normal in morphology. 
GLENOHUMERAL JOINT: Mild to moderate articular cartilaginous loss of the 
glenohumeral articulation. Multifocal tearing of the labrum. No para labral 
cyst. There is nonvisualization of the intra-articular portion of the long of 
the biceps tendon compatible with full-thickness tear. Small shoulder joint 
effusion with mild synovitis/debris. 
BONES: The bone marrow signal intensity is negative for fracture. No Hill-Sachs 
defect. Subcortical cystic change of the humeral head. 
ADDITIONAL FINDINGS: The axillary region is negative. Subcutaneous tissues are 
negative.
IMPRESSION: Supraspinatus muscular strain with a component of full-thickness tearing 
proximal from its insertion with underlying tendinosis. 
Full-thickness disruption of the long head of the biceps tendon. 
High grade partial tearing of the subscapularis insertion without muscular edema 
or atrophy. 
Mild-to-moderate degenerative changes of the glenohumeral articulation.
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
## 2023-10-25 IMAGING — MR MRI LUMBAR SPINE W/WO CONTRAST
7 of 12 series · 14 of 48 positions shown · IV contrast (gadavist)
Comparison: Lumbar spine x-ray October 09, 2023.

________________________________________________________________________________________________ 
MRI LUMBAR SPINE W/WO CONTRAST, 10/25/2023 [DATE]: 
CLINICAL INDICATION: Radiculopathy, lumbar region , right low back pain rating 
down the right leg. Fall in June 2023. Lumbar fusion. Esophageal cancer.
TECHNIQUE: Multiplanar, multiecho position MR images of the lumbar spine were 
performed without and with 9 mL of Gadavist were injected intravenously by hand. 
1 mL of Gadavist discarded. Patient was scanned on a 1.5T magnet

[Series 201: survey · axial · 10.0mm · 1.25mm/px · z∈[-136,+106]mm · 2 of 10 slices shown]
[im 1/10]
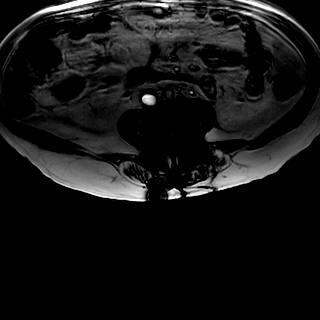
[im 10/10]
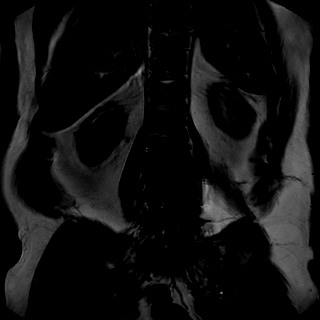

[Series 301: t2w_cor-surv · coronal · 6.0mm · 0.62mm/px · 1 of 14 slices shown]
[im 1/14]
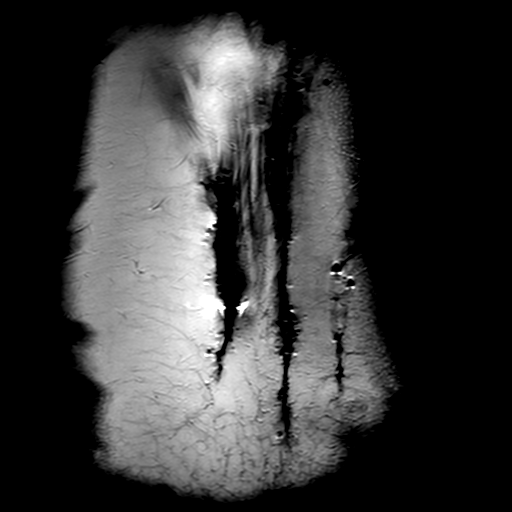

[Series 401: T1 · sagittal · 4.0mm · 0.43mm/px · 2 of 19 slices shown]
[im 1/19]
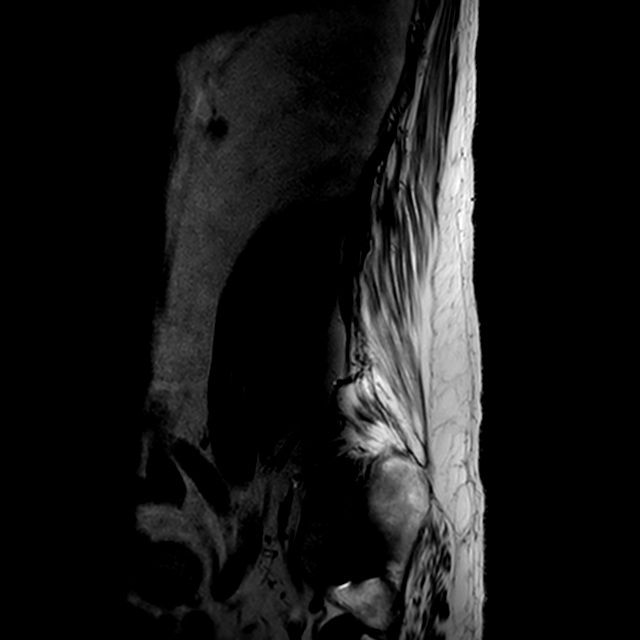
[im 19/19]
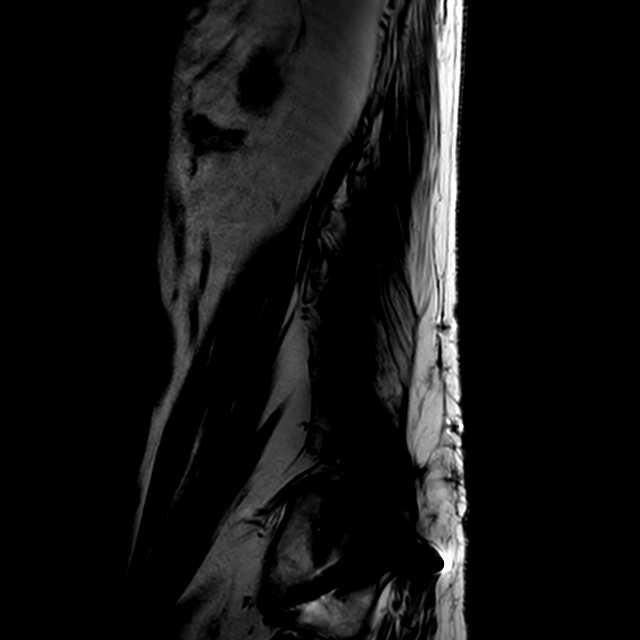

[Series 502: (id)_mdixon_tse · sagittal · 4.0mm · 0.49mm/px · 2 of 19 slices shown]
[im 1/19]
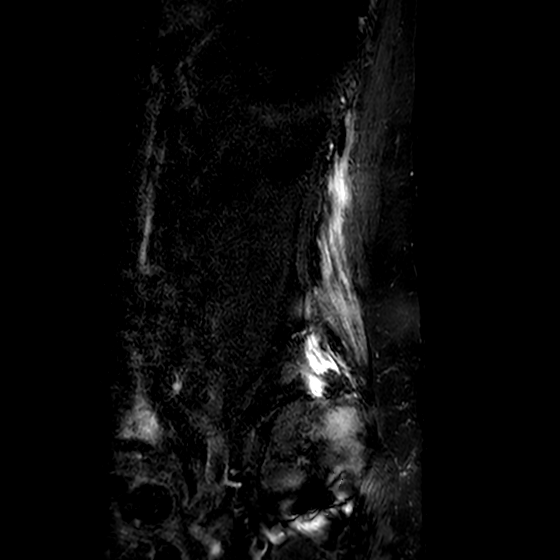
[im 19/19]
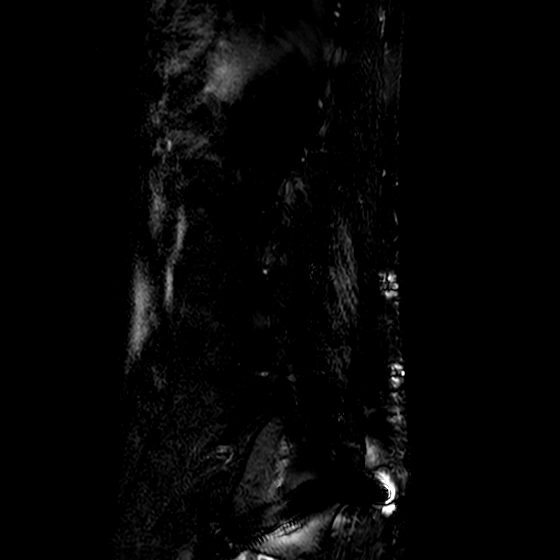

[Series 503: st2w_mdixon_tse · sagittal · 4.0mm · 0.49mm/px · 2 of 19 slices shown]
[im 1/19]
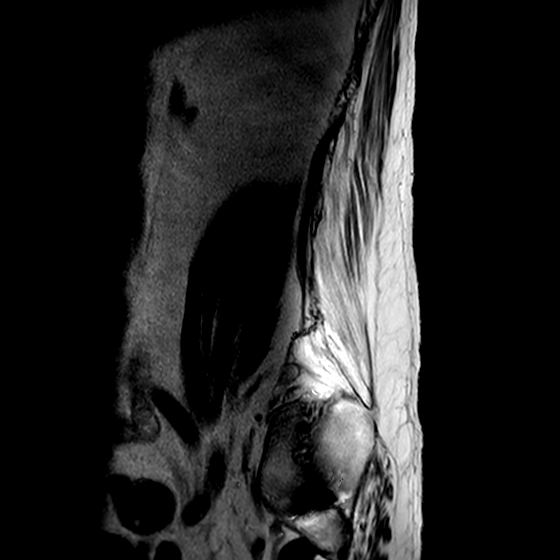
[im 19/19]
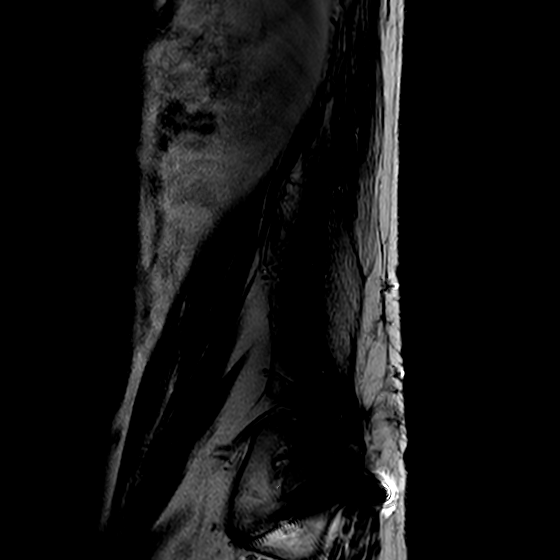

[Series 601: sag spineview ax · sagittal · 1.4mm · 0.35mm/px · 2 of 114 slices shown]
[im 1/114]
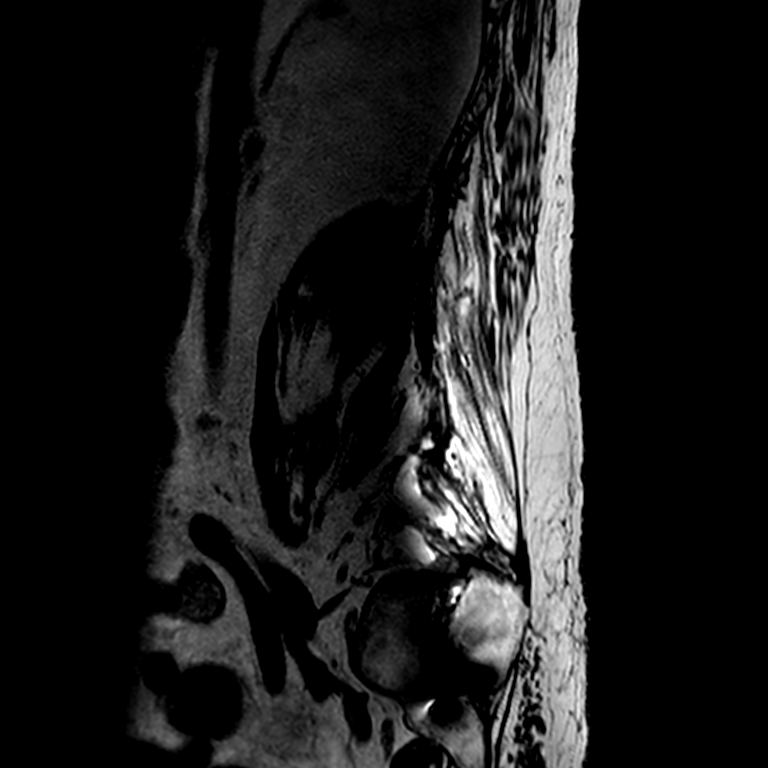
[im 21/114]
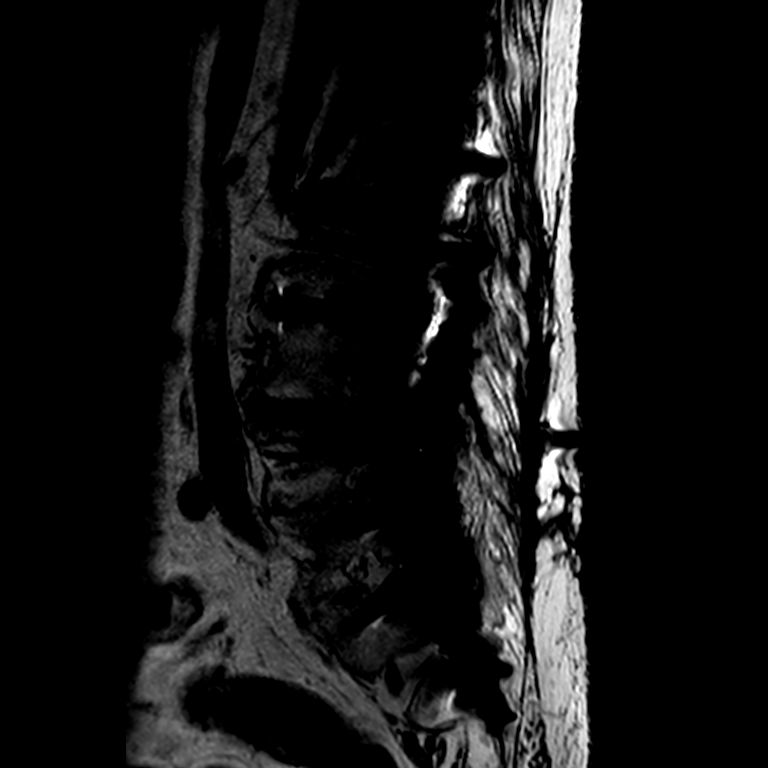

[Series 701: T2 · axial · 4.0mm · 0.30mm/px · z∈[-223,-4]mm · 3 of 30 slices shown]
[im 1/30]
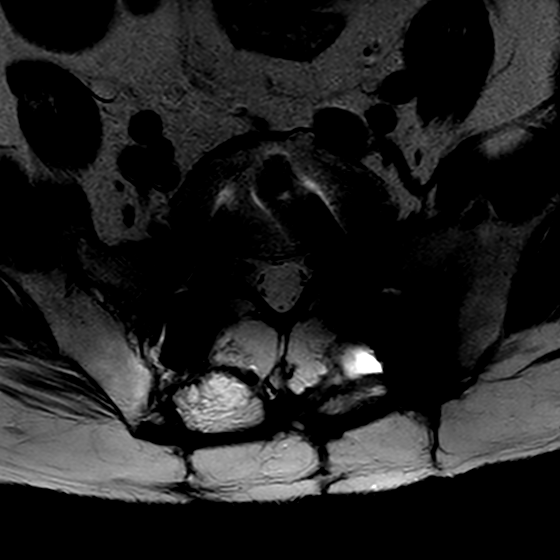
[im 15/30]
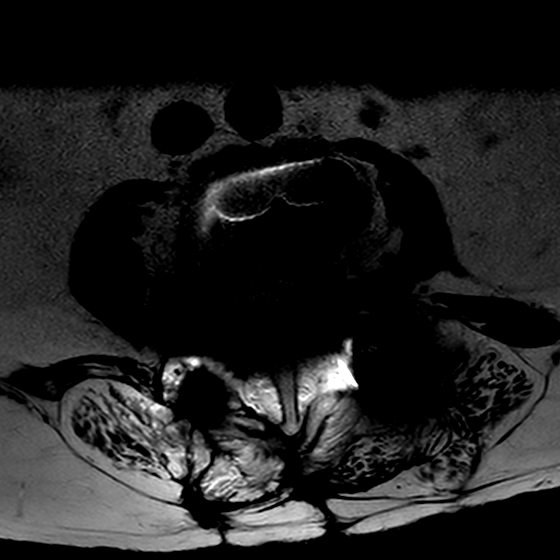
[im 30/30]
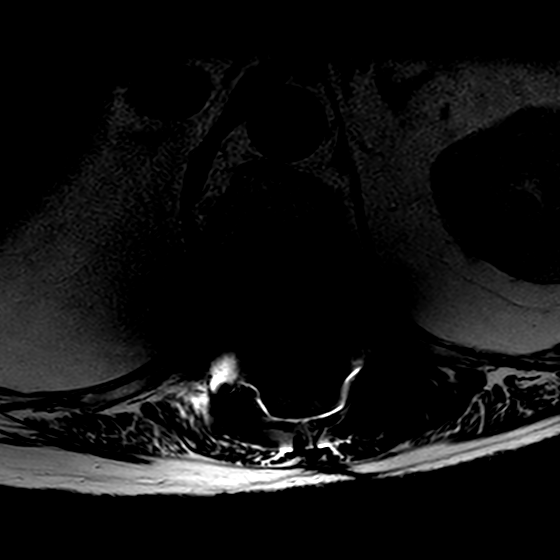

[14 of 48 positions shown; findings below may reference images not displayed]

FINDINGS: Extensive postsurgical changes. Susceptibility artifact limits evaluation of the 
canal and foramina. There are bilateral pedicle screws T11-L5 with bilateral S1 
pedicle screws. Bilateral iliac extension screws. Longitudinally connecting 
rods. Vertebroplasty cement L1 and L2 levels. Again, there may be subsistence of 
the intervertebral spacer L3-L4 level, into the subjacent endplates. Bone graft 
material is identified within the posterior elements. Suggestion for right 
hemilaminectomy L3-L4 with left hemilaminectomy L4-L5. 
-------------------------------------------------------------------------------- 
------ 
GENERAL: 
Nomenclature is based on 5 lumbar type vertebral bodies.     
ALIGNMENT: Normal coronal alignment. Grade 1 anterolisthesis L3 on L4. 
VERTEBRAL BODY HEIGHT: Normal.  
MARROW SIGNAL: No focal suspect signal abnormality. 
CORD SIGNAL: The distal spinal cord and conus are poorly visualized due to 
susceptibility artifact. Visualized cauda equina appears unremarkable. 
ADDITIONAL FINDINGS: Colonic diverticulosis.  
It would be difficult to exclude a lesion along the lateral aspect of the mid 
left kidney although this is incompletely visualized.. 
Modic I-II: None. 
Ligamentum Flavum > 2.5 mm: All except the laminectomy sites. 
-------------------------------------------------------------------------------- 
------ 
SEGMENTAL: 
T12-L1: Canal is poorly visualized given susceptibility artifact but appears to 
be patent with minimal annular bulge. Left foramen patent. Right foramen 
obscured. 
L1-L2: Suboptimal visualization of the canal with minimal annular bulge, 
although the canal is suspected be patent. Foramina appear mildly narrowed on 
the left but patent on the right. 
L2-L3: Suboptimal visualization of the central canal. Canal appears patent. Mild 
bilateral foraminal narrowing. 
L3-L4: Canal patent. Foramina appear mildly narrowed bilaterally. 
L4-L5: Canal patent. Mild bilateral foraminal narrowing. 
L5-S1: Canal patent with patent right foramen and mild narrowing left foramen. 
-------------------------------------------------------------------------------- 
------
IMPRESSION: Extensive susceptibility artifact limits evaluation of the canal and foramina. 
Consider CT to further evaluate the positioning of the hardware. 
Extensive postsurgical changes as above. Given the above limitations, no 
critical or significant canal stenosis is suspected with mild degrees of 
foraminal narrowing. 
There may be a lesion along the lateral aspect of the mid left kidney although 
this is incompletely visualized. Follow-up renal ultrasound suggested.
